# Patient Record
Sex: Male | Born: 1950 | Race: White | Hispanic: No | Marital: Married | Smoking: Former smoker
Health system: Southern US, Community
[De-identification: ages and names within clinical notes are randomized; demographics above are authoritative.]

## PROBLEM LIST (undated history)

## (undated) DIAGNOSIS — E785 Hyperlipidemia, unspecified: Secondary | ICD-10-CM

## (undated) DIAGNOSIS — K449 Diaphragmatic hernia without obstruction or gangrene: Secondary | ICD-10-CM

## (undated) DIAGNOSIS — B019 Varicella without complication: Secondary | ICD-10-CM

## (undated) DIAGNOSIS — I10 Essential (primary) hypertension: Secondary | ICD-10-CM

## (undated) DIAGNOSIS — I4891 Unspecified atrial fibrillation: Secondary | ICD-10-CM

## (undated) DIAGNOSIS — F329 Major depressive disorder, single episode, unspecified: Secondary | ICD-10-CM

## (undated) DIAGNOSIS — I00 Rheumatic fever without heart involvement: Secondary | ICD-10-CM

## (undated) DIAGNOSIS — E538 Deficiency of other specified B group vitamins: Secondary | ICD-10-CM

## (undated) DIAGNOSIS — G249 Dystonia, unspecified: Secondary | ICD-10-CM

## (undated) DIAGNOSIS — I7300 Raynaud's syndrome without gangrene: Secondary | ICD-10-CM

## (undated) DIAGNOSIS — I700 Atherosclerosis of aorta: Secondary | ICD-10-CM

## (undated) DIAGNOSIS — I639 Cerebral infarction, unspecified: Secondary | ICD-10-CM

## (undated) DIAGNOSIS — J42 Unspecified chronic bronchitis: Secondary | ICD-10-CM

## (undated) DIAGNOSIS — F32A Depression, unspecified: Secondary | ICD-10-CM

## (undated) DIAGNOSIS — M1990 Unspecified osteoarthritis, unspecified site: Secondary | ICD-10-CM

## (undated) DIAGNOSIS — F319 Bipolar disorder, unspecified: Secondary | ICD-10-CM

## (undated) DIAGNOSIS — D649 Anemia, unspecified: Secondary | ICD-10-CM

## (undated) HISTORY — DX: Unspecified osteoarthritis, unspecified site: M19.90

## (undated) HISTORY — DX: Raynaud's syndrome without gangrene: I73.00

## (undated) HISTORY — DX: Deficiency of other specified B group vitamins: E53.8

## (undated) HISTORY — DX: Hyperlipidemia, unspecified: E78.5

## (undated) HISTORY — DX: Diaphragmatic hernia without obstruction or gangrene: K44.9

## (undated) HISTORY — PX: OTHER SURGICAL HISTORY: SHX169

## (undated) HISTORY — DX: Essential (primary) hypertension: I10

## (undated) HISTORY — DX: Rheumatic fever without heart involvement: I00

## (undated) HISTORY — DX: Unspecified atrial fibrillation: I48.91

## (undated) HISTORY — DX: Major depressive disorder, single episode, unspecified: F32.9

## (undated) HISTORY — DX: Atherosclerosis of aorta: I70.0

## (undated) HISTORY — DX: Depression, unspecified: F32.A

## (undated) HISTORY — DX: Unspecified chronic bronchitis: J42

## (undated) HISTORY — DX: Varicella without complication: B01.9

## (undated) HISTORY — DX: Bipolar disorder, unspecified: F31.9

## (undated) HISTORY — PX: CLAVICLE SURGERY: SHX598

## (undated) HISTORY — PX: WISDOM TOOTH EXTRACTION: SHX21

## (undated) HISTORY — DX: Dystonia, unspecified: G24.9

## (undated) HISTORY — DX: Anemia, unspecified: D64.9

---

## 1999-01-26 ENCOUNTER — Emergency Department (HOSPITAL_COMMUNITY): Admission: EM | Admit: 1999-01-26 | Discharge: 1999-01-27 | Payer: Self-pay | Admitting: Emergency Medicine

## 1999-01-26 ENCOUNTER — Encounter: Payer: Self-pay | Admitting: Emergency Medicine

## 1999-10-27 ENCOUNTER — Encounter: Payer: Self-pay | Admitting: Emergency Medicine

## 1999-10-27 ENCOUNTER — Emergency Department (HOSPITAL_COMMUNITY): Admission: EM | Admit: 1999-10-27 | Discharge: 1999-10-27 | Payer: Self-pay | Admitting: Emergency Medicine

## 2000-12-10 ENCOUNTER — Inpatient Hospital Stay (HOSPITAL_COMMUNITY): Admission: EM | Admit: 2000-12-10 | Discharge: 2000-12-12 | Payer: Self-pay | Admitting: *Deleted

## 2002-04-26 ENCOUNTER — Inpatient Hospital Stay (HOSPITAL_COMMUNITY): Admission: EM | Admit: 2002-04-26 | Discharge: 2002-04-28 | Payer: Self-pay | Admitting: Psychiatry

## 2014-07-22 ENCOUNTER — Encounter: Payer: Self-pay | Admitting: Physician Assistant

## 2014-07-22 ENCOUNTER — Ambulatory Visit (INDEPENDENT_AMBULATORY_CARE_PROVIDER_SITE_OTHER): Payer: Medicare Other | Admitting: Physician Assistant

## 2014-07-22 VITALS — BP 128/86 | HR 58 | Temp 98.6°F | Resp 16 | Ht 68.0 in | Wt 163.8 lb

## 2014-07-22 DIAGNOSIS — J209 Acute bronchitis, unspecified: Secondary | ICD-10-CM | POA: Insufficient documentation

## 2014-07-22 MED ORDER — AZITHROMYCIN 250 MG PO TABS: ORAL_TABLET | ORAL | Status: DC

## 2014-07-22 MED ORDER — HYDROCOD POLST-CHLORPHEN POLST 10-8 MG/5ML PO LQCR: 5.0000 mL | Freq: Two times a day (BID) | ORAL | Status: DC | PRN

## 2014-07-22 NOTE — Assessment & Plan Note (Signed)
STOP amoxicillin.  Rx Azithromycin. Increase fluids.  Rest.  Rx Tussionex.  Humidifier in bedroom.  Probiotic.  Return preutions discussed with patient.

## 2014-07-22 NOTE — Patient Instructions (Signed)
Take antibiotic as directed. Increase fluid intake.  Use Tussionex as directed for cough.  Rest.  Place a humidifier in the bedroom.  Call or return to clinic if symptoms are not improving.  Please stop by the front desk to schedule a formal appointment to establish care so we can review your full history and health maintenance. We can do your full physical at that visit.

## 2014-07-22 NOTE — Progress Notes (Signed)
Patient presents to clinic today c/o productive cough of green/brown sputum, worse at night.  Also associated with chest congestion and fatigue.  Denies SOB or pleuritic chest pain.  Recently seen by previous PCP at [facility] last week and was given Rx for Tessalon Perles and Amoxicillin without improvement.    Past Medical History  Diagnosis Date  . Anemia   . Arthritis   . Chicken pox   . Depression   . Chronic bronchitis   . Hypertension   . Hyperlipidemia   . Rheumatic fever   . Vitamin B12 deficiency     Injections  . Bipolar 1 disorder    No current outpatient prescriptions on file prior to visit.   No current facility-administered medications on file prior to visit.    No Known Allergies  Family History  Problem Relation Age of Onset  . Stroke Father 76    Deceased  . Heart disease Maternal Grandfather 78    Deceased  . Hypertension Father   . Alcoholism Father   . Alzheimer's disease Mother 33    Deceased in early 72s  . Heart disease Paternal Grandfather 29    Deceased  . Alcoholism Brother   . Hypertension Son     History   Social History  . Marital Status: Married    Spouse Name: N/A    Number of Children: N/A  . Years of Education: N/A   Social History Main Topics  . Smoking status: Former Games developer  . Smokeless tobacco: None     Comment: Quit>40 years  . Alcohol Use: None  . Drug Use: None  . Sexual Activity: None   Other Topics Concern  . None   Social History Narrative  . None   Review of Systems - See HPI.  All other ROS are negative.  BP 128/86  Pulse 58  Temp(Src) 98.6 F (37 C) (Oral)  Resp 16  Ht 5\' 8"  (1.727 m)  Wt 163 lb 12 oz (74.277 kg)  BMI 24.90 kg/m2  SpO2 98%  Physical Exam  Vitals reviewed. Constitutional: He is oriented to person, place, and time and well-developed, well-nourished, and in no distress.  HENT:  Head: Normocephalic and atraumatic.  Right Ear: External ear normal.  Left Ear: External  ear normal.  Nose: Nose normal.  Mouth/Throat: Oropharynx is clear and moist. No oropharyngeal exudate.  TM within normal limits bilaterally.  Eyes: Conjunctivae are normal.  Neck: Neck supple.  Cardiovascular: Normal rate, regular rhythm, normal heart sounds and intact distal pulses.   Pulmonary/Chest: Effort normal and breath sounds normal. No respiratory distress. He has no wheezes. He has no rales. He exhibits no tenderness.  Neurological: He is alert and oriented to person, place, and time.  Skin: Skin is warm and dry. No rash noted.  Psychiatric: Affect normal.   Assessment/Plan: Acute bronchitis STOP amoxicillin.  Rx Azithromycin. Increase fluids.  Rest.  Rx Tussionex.  Humidifier in bedroom.  Probiotic.  Return preutions discussed with patient.

## 2014-07-22 NOTE — Progress Notes (Signed)
Pre visit review using our clinic review tool, if applicable. No additional management support is needed unless otherwise documented below in the visit note/SLS  

## 2014-07-28 ENCOUNTER — Telehealth: Payer: Self-pay | Admitting: *Deleted

## 2014-07-28 MED ORDER — AMLODIPINE BESYLATE 5 MG PO TABS: 5.0000 mg | ORAL_TABLET | Freq: Every day | ORAL | Status: DC

## 2014-07-28 NOTE — Telephone Encounter (Signed)
Requested Prescriptions   Signed Prescriptions Disp Refills  . amLODipine (NORVASC) 5 MG tablet 30 tablet 0    Sig: Take 1 tablet (5 mg total) by mouth daily.    Authorizing Provider: Waldon Merl    Ordering User: Burnard Leigh L   Pt walked into office with refill request, pt walked into office for Acute visit prior, does not officially Establish until 09.08.15; 30-day supply only until appointment per provider/SLS

## 2014-08-16 ENCOUNTER — Ambulatory Visit (INDEPENDENT_AMBULATORY_CARE_PROVIDER_SITE_OTHER): Payer: Medicare Other | Admitting: Physician Assistant

## 2014-08-16 ENCOUNTER — Encounter: Payer: Self-pay | Admitting: Physician Assistant

## 2014-08-16 VITALS — BP 150/80 | HR 59 | Temp 98.0°F | Ht 67.8 in | Wt 159.0 lb

## 2014-08-16 DIAGNOSIS — R5381 Other malaise: Secondary | ICD-10-CM

## 2014-08-16 DIAGNOSIS — R0989 Other specified symptoms and signs involving the circulatory and respiratory systems: Secondary | ICD-10-CM

## 2014-08-16 DIAGNOSIS — D649 Anemia, unspecified: Secondary | ICD-10-CM

## 2014-08-16 DIAGNOSIS — E782 Mixed hyperlipidemia: Secondary | ICD-10-CM

## 2014-08-16 DIAGNOSIS — F3162 Bipolar disorder, current episode mixed, moderate: Secondary | ICD-10-CM

## 2014-08-16 DIAGNOSIS — K219 Gastro-esophageal reflux disease without esophagitis: Secondary | ICD-10-CM | POA: Insufficient documentation

## 2014-08-16 DIAGNOSIS — D518 Other vitamin B12 deficiency anemias: Secondary | ICD-10-CM

## 2014-08-16 DIAGNOSIS — E785 Hyperlipidemia, unspecified: Secondary | ICD-10-CM

## 2014-08-16 DIAGNOSIS — I10 Essential (primary) hypertension: Secondary | ICD-10-CM

## 2014-08-16 DIAGNOSIS — R5383 Other fatigue: Secondary | ICD-10-CM

## 2014-08-16 DIAGNOSIS — D519 Vitamin B12 deficiency anemia, unspecified: Secondary | ICD-10-CM | POA: Insufficient documentation

## 2014-08-16 DIAGNOSIS — F319 Bipolar disorder, unspecified: Secondary | ICD-10-CM | POA: Insufficient documentation

## 2014-08-16 DIAGNOSIS — R252 Cramp and spasm: Secondary | ICD-10-CM

## 2014-08-16 DIAGNOSIS — F3181 Bipolar II disorder: Secondary | ICD-10-CM

## 2014-08-16 HISTORY — DX: Bipolar II disorder: F31.81

## 2014-08-16 HISTORY — DX: Mixed hyperlipidemia: E78.2

## 2014-08-16 HISTORY — DX: Gastro-esophageal reflux disease without esophagitis: K21.9

## 2014-08-16 HISTORY — DX: Bipolar disorder, unspecified: F31.9

## 2014-08-16 HISTORY — DX: Bipolar disorder, current episode mixed, moderate: F31.62

## 2014-08-16 HISTORY — DX: Other specified symptoms and signs involving the circulatory and respiratory systems: R09.89

## 2014-08-16 HISTORY — DX: Anemia, unspecified: D64.9

## 2014-08-16 LAB — BASIC METABOLIC PANEL
BUN: 18 mg/dL (ref 6–23)
CHLORIDE: 105 meq/L (ref 96–112)
CO2: 28 mEq/L (ref 19–32)
Calcium: 9.2 mg/dL (ref 8.4–10.5)
Creatinine, Ser: 1.1 mg/dL (ref 0.4–1.5)
GFR: 72.68 mL/min (ref >60.00)
Glucose, Bld: 94 mg/dL (ref 70–99)
Potassium: 4.2 mEq/L (ref 3.5–5.1)
Sodium: 140 mEq/L (ref 135–145)

## 2014-08-16 LAB — CBC
HCT: 38.8 % — ABNORMAL LOW (ref 39.0–52.0)
Hemoglobin: 13.1 g/dL (ref 13.0–17.0)
MCHC: 33.7 g/dL (ref 30.0–36.0)
MCV: 92.6 fl (ref 78.0–100.0)
PLATELETS: 225 10*3/uL (ref 150.0–400.0)
RBC: 4.18 Mil/uL — ABNORMAL LOW (ref 4.22–5.81)
RDW: 12.8 % (ref 11.5–15.5)
WBC: 4.4 10*3/uL (ref 4.0–10.5)

## 2014-08-16 LAB — VITAMIN B12: VITAMIN B 12: 316 pg/mL (ref 211–911)

## 2014-08-16 LAB — LITHIUM LEVEL: LITHIUM LVL: 0.7 meq/L — AB (ref 0.80–1.40)

## 2014-08-16 MED ORDER — AMLODIPINE BESYLATE 5 MG PO TABS: 5.0000 mg | ORAL_TABLET | Freq: Every day | ORAL | Status: DC

## 2014-08-16 MED ORDER — LOVASTATIN 40 MG PO TABS: 40.0000 mg | ORAL_TABLET | Freq: Every day | ORAL | Status: DC

## 2014-08-16 MED ORDER — OMEPRAZOLE 40 MG PO CPDR: 40.0000 mg | DELAYED_RELEASE_CAPSULE | Freq: Every morning | ORAL | Status: DC

## 2014-08-16 NOTE — Assessment & Plan Note (Signed)
Well controlled on current regimen. Patient to followup with specialist as directed. Will obtain lithium level. Will fax results to his psychiatrist.

## 2014-08-16 NOTE — Assessment & Plan Note (Signed)
We'll recheck CBC and B12 level. We'll also check an intrinsic factor

## 2014-08-16 NOTE — Progress Notes (Signed)
Pre visit review using our clinic review tool, if applicable. No additional management support is needed unless otherwise documented below in the visit note. 

## 2014-08-16 NOTE — Progress Notes (Signed)
Patient presents to clinic today to formally establish care.  Patient was seen previously for an acute concern.  Chronic Issues: Bipolar Disorder -- followed by psychiatry. Patient currently on combination of lithium and Tegretol. States bipolar disorder is mixed. Denies rapid cycling. Has been well controlled on this medication regimen for several years. Patient needing a lithium level checked in 4 to to his psychiatrist for review.  B12 Deficiency Anemia -- patient endorses history of B12 deficiency requiring injections. Would like a repeat B12 level checked.  Hyperlipidemia -- patient currently on Mevacor 40 mg daily. Denies myalgias. Endorses having a recent annual examination with his previous PCP. States cholesterol looked good at that time.  Hypertension -- patient currently on amlodipine daily. BP slightly elevated in clinic today. Patient denies taking his medication this morning. Patient denies chest pain, palpitations, lightheadedness, dizziness, frequent headaches or vision changes.  GERD -- well controlled with Prilosec 40 mg daily. Endorses history of hiatal hernia.  Past Medical History  Diagnosis Date  . Anemia   . Arthritis   . Chicken pox   . Depression   . Chronic bronchitis   . Hypertension   . Hyperlipidemia   . Rheumatic fever   . Vitamin B12 deficiency     Injections  . Bipolar 1 disorder     Past Surgical History  Procedure Laterality Date  . Clavicle surgery      Right, Hardware placed  . Wisdom tooth extraction      Current Outpatient Prescriptions on File Prior to Visit  Medication Sig Dispense Refill  . carbamazepine (TEGRETOL) 200 MG tablet Take 200 mg by mouth 3 (three) times daily.      . clonazePAM (KLONOPIN) 1 MG tablet Take 1 mg by mouth as directed. Take [1] Tablet at Bedtime & 1/2 Tablet Twice Daily for Anxiety      . lithium carbonate 300 MG capsule Take 300 mg by mouth 3 (three) times daily with meals.       No current  facility-administered medications on file prior to visit.    No Known Allergies  Family History  Problem Relation Age of Onset  . Stroke Father 72    Deceased  . Heart disease Maternal Grandfather 63    Deceased  . Hypertension Father   . Alcoholism Father   . Alzheimer's disease Mother 51    Deceased in early 83s  . Heart disease Paternal Grandfather 42    Deceased  . Alcoholism Brother   . Hypertension Son     History   Social History  . Marital Status: Married    Spouse Name: N/A    Number of Children: N/A  . Years of Education: N/A   Occupational History  . Not on file.   Social History Main Topics  . Smoking status: Former Games developer  . Smokeless tobacco: Not on file     Comment: Quit>40 years  . Alcohol Use: Not on file  . Drug Use: Not on file  . Sexual Activity: Not on file   Other Topics Concern  . Not on file   Social History Narrative  . No narrative on file   ROS See history of present illness. All other review of systems are negative.  BP 150/80  Pulse 59  Temp(Src) 98 F (36.7 C)  Ht 5' 7.8" (1.722 m)  Wt 159 lb (72.122 kg)  BMI 24.32 kg/m2  SpO2 99%  Physical Exam  Vitals reviewed. Constitutional: He is oriented to person, place, and  time and well-developed, well-nourished, and in no distress.  HENT:  Head: Normocephalic and atraumatic.  Right Ear: External ear normal.  Left Ear: External ear normal.  Nose: Nose normal.  Mouth/Throat: Oropharynx is clear and moist. No oropharyngeal exudate.  Tympanic membranes within normal limits bilaterally.  Eyes: Conjunctivae are normal. Pupils are equal, round, and reactive to light.  Neck: Neck supple. No thyromegaly present.  Cardiovascular: Normal rate, regular rhythm, normal heart sounds and intact distal pulses.   Pulmonary/Chest: Effort normal and breath sounds normal. No respiratory distress. He has no wheezes. He has no rales. He exhibits no tenderness.  Lymphadenopathy:    He has no  cervical adenopathy.  Neurological: He is alert and oriented to person, place, and time.  Skin: Skin is warm and dry. No rash noted.  Psychiatric: Affect normal.   Assessment/Plan: Essential hypertension, benign Continue current regimen. DASH diet encouraged. Patient instructed to please take all medications before coming to appointments so that we can accurately assess his blood pressure.  Gastroesophageal reflux disease without esophagitis Well controlled. Continue current medications.  B12 deficiency anemia We'll recheck CBC and B12 level. We'll also check an intrinsic factor  Bipolar 1 disorder, mixed, moderate Well controlled on current regimen. Patient to followup with specialist as directed. Will obtain lithium level. Will fax results to his psychiatrist.  Hyperlipidemia Patient endorses well controlled. Continue current regimen. Will obtain records from previous PCP.

## 2014-08-16 NOTE — Assessment & Plan Note (Signed)
Continue current regimen. DASH diet encouraged. Patient instructed to please take all medications before coming to appointments so that we can accurately assess his blood pressure.

## 2014-08-16 NOTE — Patient Instructions (Signed)
Please continue medications as directed.  Please go the lab.  I will call you with your results.  We will restart Vitamin B12 supplementation if indicated by labs.  Please follow-up with your psychiatrist concerning medications changes to help with tremor.  I will make sure that there is not an electrolyte imbalance contributing to this.  Follow-up will be based on your results.

## 2014-08-16 NOTE — Assessment & Plan Note (Signed)
Patient endorses well controlled. Continue current regimen. Will obtain records from previous PCP.

## 2014-08-16 NOTE — Assessment & Plan Note (Signed)
Well controlled. Continue current medications  

## 2014-08-18 LAB — INTRINSIC FACTOR ANTIBODIES: INTRINSIC FACTOR: NEGATIVE

## 2014-08-21 LAB — VITAMIN D 1,25 DIHYDROXY
VITAMIN D2 1, 25 (OH): 35 pg/mL
Vitamin D 1, 25 (OH)2 Total: 66 pg/mL (ref 18–72)
Vitamin D3 1, 25 (OH)2: 31 pg/mL

## 2014-08-22 ENCOUNTER — Telehealth: Payer: Self-pay | Admitting: Physician Assistant

## 2014-08-22 ENCOUNTER — Ambulatory Visit (INDEPENDENT_AMBULATORY_CARE_PROVIDER_SITE_OTHER): Payer: Medicare Other

## 2014-08-22 DIAGNOSIS — H9190 Unspecified hearing loss, unspecified ear: Secondary | ICD-10-CM

## 2014-08-22 DIAGNOSIS — Z23 Encounter for immunization: Secondary | ICD-10-CM

## 2014-08-22 NOTE — Telephone Encounter (Signed)
Caller name:Noah Grant Relation to ZJ:IRCV Call back number:[phone] Pharmacy:  Reason for call: pt needs referral to [facility] audiological, pt has appt on 08/29/14, pt has [employer] complete, fax to [phone].

## 2014-08-22 NOTE — Telephone Encounter (Signed)
Referral placed.

## 2014-08-22 NOTE — Telephone Encounter (Signed)
Please Advise

## 2014-11-15 ENCOUNTER — Encounter: Payer: Self-pay | Admitting: Physician Assistant

## 2014-11-15 ENCOUNTER — Ambulatory Visit (INDEPENDENT_AMBULATORY_CARE_PROVIDER_SITE_OTHER): Payer: Medicare Other | Admitting: Physician Assistant

## 2014-11-15 VITALS — BP 169/78 | HR 66 | Temp 98.4°F | Wt 168.0 lb

## 2014-11-15 DIAGNOSIS — J209 Acute bronchitis, unspecified: Secondary | ICD-10-CM | POA: Insufficient documentation

## 2014-11-15 MED ORDER — AZITHROMYCIN 250 MG PO TABS: ORAL_TABLET | ORAL | Status: DC

## 2014-11-15 MED ORDER — ALBUTEROL SULFATE HFA 108 (90 BASE) MCG/ACT IN AERS: 2.0000 | INHALATION_SPRAY | Freq: Four times a day (QID) | RESPIRATORY_TRACT | Status: DC | PRN

## 2014-11-15 NOTE — Patient Instructions (Signed)
Please take antibiotic as directed.  Increase your fluid intake.  Use plain Mucinex for congestion.  Get plenty of rest.  Delsym for nighttime cough. Continue other medications as directed.  Call or return to clinic if symptoms are not improving.  I have sent in an albuterol inhaler for you to pick up and use as directed if you notice any recurrence of chest tightness.  Metered Dose Inhaler (No Spacer Used) Inhaled medicines are the basis of treatment for asthma and other breathing problems. Inhaled medicine can only be effective if used properly. Good technique assures that the medicine reaches the lungs. Metered dose inhalers (MDIs) are used to deliver a variety of inhaled medicines. These include quick relief or rescue medicines (such as bronchodilators) and controller medicines (such as corticosteroids). The medicine is delivered by pushing down on a metal canister to release a set amount of spray. If you are using different kinds of inhalers, use your quick relief medicine to open the airways 10-15 minutes before using a steroid, if instructed to do so by your health care provider. If you are unsure which inhalers to use and the order of using them, ask your health care provider, nurse, or respiratory therapist. HOW TO USE THE INHALER 1. Remove the cap from the inhaler. 2. If you are using the inhaler for the first time, you will need to prime it. Shake the inhaler for 5 seconds and release four puffs into the air, away from your face. Ask your health care provider or pharmacist if you have questions about priming your inhaler. 3. Shake the inhaler for 5 seconds before each breath in (inhalation). 4. Position the inhaler so that the top of the canister faces up. 5. Put your index finger on the top of the medicine canister. Your thumb supports the bottom of the inhaler. 6. Open your mouth. 7. Either place the inhaler between your teeth and place your lips tightly around the mouthpiece, or hold the  inhaler 1-2 inches away from your open mouth. If you are unsure of which technique to use, ask your health care provider. 8. Breathe out (exhale) normally and as completely as possible. 9. Press the canister down with the index finger to release the medicine. 10. At the same time as the canister is pressed, inhale deeply and slowly until your lungs are completely filled. This should take 4-6 seconds. Keep your tongue down. 11. Hold the medicine in your lungs for 5-10 seconds (10 seconds is best). This helps the medicine get into the small airways of your lungs. 12. Breathe out slowly, through pursed lips. Whistling is an example of pursed lips. 13. Wait at least 1 minute between puffs. Continue with the above steps until you have taken the number of puffs your health care provider has ordered. Do not use the inhaler more than your health care provider directs you to. 14. Replace the cap on the inhaler. 15. Follow the directions from your health care provider or the inhaler insert for cleaning the inhaler. If you are using a steroid inhaler, after your last puff, rinse your mouth with water, gargle, and spit out the water. Do not swallow the water. AVOID:  Inhaling before or after starting the spray of medicine. It takes practice to coordinate your breathing with triggering the spray.  Inhaling through the nose (rather than the mouth) when triggering the spray. HOW TO DETERMINE IF YOUR INHALER IS FULL OR NEARLY EMPTY You cannot know when an inhaler is empty by shaking  it. Some inhalers are now being made with dose counters. Ask your health care provider for a prescription that has a dose counter if you feel you need that extra help. If your inhaler does not have a counter, ask your health care provider to help you determine the date you need to refill your inhaler. Write the refill date on a calendar or your inhaler canister. Refill your inhaler 7-10 days before it runs out. Be sure to keep an  adequate supply of medicine. This includes making sure it has not expired, and making sure you have a spare inhaler. SEEK MEDICAL CARE IF:  Symptoms are only partially relieved with your inhaler.  You are having trouble using your inhaler.  You experience an increase in phlegm. SEEK IMMEDIATE MEDICAL CARE IF:  You feel little or no relief with your inhalers. You are still wheezing and feeling shortness of breath, tightness in your chest, or both.  You have dizziness, headaches, or a fast heart rate.  You have chills, fever, or night sweats.  There is a noticeable increase in phlegm production, or there is blood in the phlegm. MAKE SURE YOU:  Understand these instructions.  Will watch your condition.  Will get help right away if you are not doing well or get worse. Document Released: 09/22/2007 Document Revised: 04/11/2014 Document Reviewed: 05/13/2013 [facility] Patient Information 2015 [place], [place]. This information is not intended to replace advice given to you by your health care provider. Make sure you discuss any questions you have with your health care provider.

## 2014-11-15 NOTE — Progress Notes (Signed)
Pre visit review using our clinic review tool, if applicable. No additional management support is needed unless otherwise documented below in the visit note. 

## 2014-11-15 NOTE — Assessment & Plan Note (Signed)
Rx Azithromycin.  Albuterol inhaler for chest tightness.  Dosing instructions given. Increase fluids.  Rest. Plain Mucinex. Humidifier in bedroom.  Continue chronic medications as directed.  Return precautions discussed with patient.

## 2014-11-15 NOTE — Progress Notes (Signed)
Patient presents to clinic today c/o chest congestion, productive cough with thick yellow sputum, chest tightness x 2 weeks.  Has felt feverish but denies checking temperature.  Denies pleuritic chest pain or SOB.  Denies recent travel or sick contact.  Past Medical History  Diagnosis Date  . Anemia   . Arthritis   . Chicken pox   . Depression   . Chronic bronchitis   . Hypertension   . Hyperlipidemia   . Rheumatic fever   . Vitamin B12 deficiency     Injections  . Bipolar 1 disorder     Current Outpatient Prescriptions on File Prior to Visit  Medication Sig Dispense Refill  . amLODipine (NORVASC) 5 MG tablet Take 1 tablet (5 mg total) by mouth daily. 90 tablet 1  . carbamazepine (TEGRETOL) 200 MG tablet Take 200 mg by mouth 3 (three) times daily.    . clonazePAM (KLONOPIN) 1 MG tablet Take 1 mg by mouth as directed. Take [1] Tablet at Bedtime & 1/2 Tablet Twice Daily for Anxiety    . lithium carbonate 300 MG capsule Take 300 mg by mouth 3 (three) times daily with meals.    . lovastatin (MEVACOR) 40 MG tablet Take 1 tablet (40 mg total) by mouth at bedtime. 90 tablet 1  . omeprazole (PRILOSEC) 40 MG capsule Take 1 capsule (40 mg total) by mouth every morning. 90 capsule 1   No current facility-administered medications on file prior to visit.    No Known Allergies  Family History  Problem Relation Age of Onset  . Stroke Father 110    Deceased  . Heart disease Maternal Grandfather 41    Deceased  . Hypertension Father   . Alcoholism Father   . Alzheimer's disease Mother 42    Deceased in early 56s  . Heart disease Paternal Grandfather 59    Deceased  . Alcoholism Brother   . Hypertension Son     History   Social History  . Marital Status: Married    Spouse Name: N/A    Number of Children: N/A  . Years of Education: N/A   Social History Main Topics  . Smoking status: Former Games developer  . Smokeless tobacco: None     Comment: Quit>40 years  . Alcohol Use: None  .  Drug Use: None  . Sexual Activity: None   Other Topics Concern  . None   Social History Narrative   Review of Systems - See HPI.  All other ROS are negative.  BP 169/78 mmHg  Pulse 66  Temp(Src) 98.4 F (36.9 C)  Wt 168 lb (76.204 kg)  SpO2 98%  Physical Exam  Constitutional: He is oriented to person, place, and time and well-developed, well-nourished, and in no distress.  HENT:  Head: Normocephalic and atraumatic.  Right Ear: External ear normal.  Left Ear: External ear normal.  Nose: Nose normal.  Mouth/Throat: Oropharynx is clear and moist. No oropharyngeal exudate.  TM within normal limits bilaterally.  Eyes: Conjunctivae are normal.  Neck: Neck supple.  Cardiovascular: Normal rate, regular rhythm, normal heart sounds and intact distal pulses.   Pulmonary/Chest: Effort normal and breath sounds normal. No respiratory distress. He has no wheezes. He has no rales. He exhibits no tenderness.  Lymphadenopathy:    He has no cervical adenopathy.  Neurological: He is alert and oriented to person, place, and time.  Skin: Skin is warm and dry. No rash noted.  Psychiatric: Affect normal.  Vitals reviewed.  Assessment/Plan: Acute bronchitis  Rx Azithromycin.  Albuterol inhaler for chest tightness.  Dosing instructions given. Increase fluids.  Rest. Plain Mucinex. Humidifier in bedroom.  Continue chronic medications as directed.  Return precautions discussed with patient.

## 2014-12-14 DIAGNOSIS — R079 Chest pain, unspecified: Secondary | ICD-10-CM | POA: Diagnosis not present

## 2014-12-14 DIAGNOSIS — F319 Bipolar disorder, unspecified: Secondary | ICD-10-CM | POA: Diagnosis not present

## 2014-12-14 DIAGNOSIS — E785 Hyperlipidemia, unspecified: Secondary | ICD-10-CM | POA: Diagnosis not present

## 2014-12-14 DIAGNOSIS — I10 Essential (primary) hypertension: Secondary | ICD-10-CM | POA: Diagnosis not present

## 2014-12-22 DIAGNOSIS — R011 Cardiac murmur, unspecified: Secondary | ICD-10-CM | POA: Diagnosis not present

## 2014-12-22 DIAGNOSIS — R079 Chest pain, unspecified: Secondary | ICD-10-CM | POA: Diagnosis not present

## 2015-01-09 DIAGNOSIS — F3132 Bipolar disorder, current episode depressed, moderate: Secondary | ICD-10-CM | POA: Diagnosis not present

## 2015-02-10 ENCOUNTER — Encounter: Payer: Self-pay | Admitting: Physician Assistant

## 2015-02-10 ENCOUNTER — Ambulatory Visit (INDEPENDENT_AMBULATORY_CARE_PROVIDER_SITE_OTHER): Payer: Medicare Other | Admitting: Physician Assistant

## 2015-02-10 ENCOUNTER — Ambulatory Visit (HOSPITAL_BASED_OUTPATIENT_CLINIC_OR_DEPARTMENT_OTHER)
Admission: RE | Admit: 2015-02-10 | Discharge: 2015-02-10 | Disposition: A | Discharge status: Home / Self Care | Payer: Medicare Other | Source: Ambulatory Visit | Attending: Physician Assistant | Admitting: Physician Assistant

## 2015-02-10 VITALS — BP 150/80 | HR 59 | Temp 97.9°F | Resp 16 | Ht 68.0 in | Wt 172.0 lb

## 2015-02-10 DIAGNOSIS — M25641 Stiffness of right hand, not elsewhere classified: Secondary | ICD-10-CM | POA: Diagnosis not present

## 2015-02-10 DIAGNOSIS — K219 Gastro-esophageal reflux disease without esophagitis: Secondary | ICD-10-CM | POA: Diagnosis not present

## 2015-02-10 DIAGNOSIS — M7989 Other specified soft tissue disorders: Secondary | ICD-10-CM | POA: Insufficient documentation

## 2015-02-10 DIAGNOSIS — M19042 Primary osteoarthritis, left hand: Secondary | ICD-10-CM | POA: Diagnosis not present

## 2015-02-10 DIAGNOSIS — E785 Hyperlipidemia, unspecified: Secondary | ICD-10-CM | POA: Diagnosis not present

## 2015-02-10 DIAGNOSIS — M47814 Spondylosis without myelopathy or radiculopathy, thoracic region: Secondary | ICD-10-CM | POA: Diagnosis not present

## 2015-02-10 DIAGNOSIS — M2560 Stiffness of unspecified joint, not elsewhere classified: Secondary | ICD-10-CM | POA: Diagnosis not present

## 2015-02-10 DIAGNOSIS — M19041 Primary osteoarthritis, right hand: Secondary | ICD-10-CM | POA: Diagnosis not present

## 2015-02-10 DIAGNOSIS — M546 Pain in thoracic spine: Secondary | ICD-10-CM

## 2015-02-10 DIAGNOSIS — B002 Herpesviral gingivostomatitis and pharyngotonsillitis: Secondary | ICD-10-CM | POA: Insufficient documentation

## 2015-02-10 DIAGNOSIS — M25642 Stiffness of left hand, not elsewhere classified: Secondary | ICD-10-CM | POA: Insufficient documentation

## 2015-02-10 HISTORY — DX: Herpesviral gingivostomatitis and pharyngotonsillitis: B00.2

## 2015-02-10 HISTORY — DX: Pain in thoracic spine: M54.6

## 2015-02-10 LAB — RHEUMATOID FACTOR: Rhuematoid fact SerPl-aCnc: <10 IU/mL (ref <=14)

## 2015-02-10 LAB — SEDIMENTATION RATE: SED RATE: 3 mm/h (ref 0–22)

## 2015-02-10 MED ORDER — OMEPRAZOLE 40 MG PO CPDR: 40.0000 mg | DELAYED_RELEASE_CAPSULE | Freq: Every morning | ORAL | Status: DC

## 2015-02-10 MED ORDER — ACYCLOVIR 400 MG PO TABS: 400.0000 mg | ORAL_TABLET | Freq: Two times a day (BID) | ORAL | Status: DC

## 2015-02-10 MED ORDER — LOVASTATIN 40 MG PO TABS: 40.0000 mg | ORAL_TABLET | Freq: Every day | ORAL | Status: DC

## 2015-02-10 MED ORDER — CYCLOBENZAPRINE HCL 10 MG PO TABS: 10.0000 mg | ORAL_TABLET | Freq: Every day | ORAL | Status: DC

## 2015-02-10 NOTE — Assessment & Plan Note (Signed)
Midline point tenderness noted on examination. Will obtain x-ray to further assess. Supportive measures discussed.

## 2015-02-10 NOTE — Progress Notes (Signed)
Patient presents to clinic today with multiple complaints.  Patient endorses multiple painful mouth sores that have been occurring intermittently over the past several months. Denies trauma or injury to mouth. Uses a soft bristled toothbrush. Denies recent change in diet.  Denies prior history of aphthous ulcer. Thinks he has had a cold sore before.  Patient also complains of pain and prolonged stiffness of the joints in his hands bilaterally. States this is been present for several years but has now worsened. Endorses intermittent swelling. Denies unexplainable fever. Endorses positive family history of rheumatoid arthritis. Has never had imaging for this.  Patient also complains of thoracic back pain starting 2 months ago. States the pain is midline and is nonradiating states the pain is sharp and sometimes present when ambulating. Denies known trauma or injury. Denies history of vertebral fracture. Denies numbness or tingling around the area. Has not taken anything for his symptoms.  Past Medical History  Diagnosis Date  . Anemia   . Arthritis   . Chicken pox   . Depression   . Chronic bronchitis   . Hypertension   . Hyperlipidemia   . Rheumatic fever   . Vitamin B12 deficiency     Injections  . Bipolar 1 disorder     Current Outpatient Prescriptions on File Prior to Visit  Medication Sig Dispense Refill  . carbamazepine (TEGRETOL) 200 MG tablet Take 200 mg by mouth 3 (three) times daily.    [name] lithium carbonate 300 MG capsule Take 300 mg by mouth 2 (two) times daily with a meal.      No current facility-administered medications on file prior to visit.    No Known Allergies  Family History  Problem Relation Age of Onset  . Stroke Father 64    Deceased  . Heart disease Maternal Grandfather 32    Deceased  . Hypertension Father   . Alcoholism Father   . Alzheimer's disease Mother 31    Deceased in early 64s  . Heart disease Paternal Grandfather 51    Deceased  .  Alcoholism Brother   . Hypertension Son     History   Social History  . Marital Status: Married    Spouse Name: N/A  . Number of Children: N/A  . Years of Education: N/A   Social History Main Topics  . Smoking status: Former Games developer  . Smokeless tobacco: Not on file     Comment: Quit>40 years  . Alcohol Use: Not on file  . Drug Use: Not on file  . Sexual Activity: Not on file   Other Topics Concern  . None   Social History Narrative   Review of Systems - See HPI.  All other ROS are negative.  BP 150/80 mmHg  Pulse 59  Temp(Src) 97.9 F (36.6 C) (Oral)  Resp 16  Ht 5\' 8"  (1.727 m)  Wt 172 lb (78.019 kg)  BMI 26.16 kg/m2  SpO2 99%  Physical Exam  Constitutional: He is oriented to person, place, and time and well-developed, well-nourished, and in no distress.  HENT:  Head: Normocephalic and atraumatic.  Right Ear: External ear normal.  Left Ear: External ear normal.  Nose: Nose normal.  Mouth/Throat: Oropharynx is clear and moist. No oropharyngeal exudate.  Eyes: Conjunctivae are normal.  Cardiovascular: Normal rate, regular rhythm, normal heart sounds and intact distal pulses.   Pulmonary/Chest: Effort normal and breath sounds normal. No respiratory distress. He has no wheezes. He has no rales. He exhibits no tenderness.  Musculoskeletal:       Thoracic back: He exhibits tenderness, bony tenderness, pain and spasm. He exhibits normal range of motion.  Neurological: He is alert and oriented to person, place, and time.  Skin: Skin is warm and dry. No rash noted.  Psychiatric: Affect normal.  Vitals reviewed.   Recent Results (from the past 2160 hour(s))  Sed Rate (ESR)     Status: None   Collection Time: 02/10/15  9:57 AM  Result Value Ref Range   Sed Rate 3 0 - 22 mm/hr    Assessment/Plan: Herpes gingivostomatitis Will begin Prophylactic acyclovir 400 mg twice daily.  Encouraged use of peroxide mouthwash.  follow-up in one month.   Joint stiffness OA  versus RA. Will obtain x-ray of hands bilaterally. We'll also obtain sedimentation rate and rheumatoid factor given family history. Supportive measures discussed. Encouraged daily Tylenol or ibuprofen.   Midline thoracic back pain Midline point tenderness noted on examination. Will obtain x-ray to further assess. Supportive measures discussed.

## 2015-02-10 NOTE — Progress Notes (Signed)
Pre visit review using our clinic review tool, if applicable. No additional management support is needed unless otherwise documented below in the visit note/SLS  

## 2015-02-10 NOTE — Patient Instructions (Signed)
Please take Zovirax twice daily as directed.  Increase fluids.  Continue other medications as directed. Alternate tylenol extra strength and ibuprofen as needed for pain. Use Flexeril at bedtime.  I will call you with your results.   Follow-up in 1 month.

## 2015-02-10 NOTE — Assessment & Plan Note (Signed)
Will begin Prophylactic acyclovir 400 mg twice daily.  Encouraged use of peroxide mouthwash.  follow-up in one month.

## 2015-02-10 NOTE — Assessment & Plan Note (Signed)
OA versus RA. Will obtain x-ray of hands bilaterally. We'll also obtain sedimentation rate and rheumatoid factor given family history. Supportive measures discussed. Encouraged daily Tylenol or ibuprofen.

## 2015-03-14 ENCOUNTER — Encounter: Payer: Self-pay | Admitting: Physician Assistant

## 2015-03-14 ENCOUNTER — Ambulatory Visit (INDEPENDENT_AMBULATORY_CARE_PROVIDER_SITE_OTHER): Payer: Medicare Other | Admitting: Physician Assistant

## 2015-03-14 VITALS — BP 143/68 | HR 56 | Temp 98.0°F | Resp 16 | Ht 68.0 in | Wt 170.4 lb

## 2015-03-14 DIAGNOSIS — M2560 Stiffness of unspecified joint, not elsewhere classified: Secondary | ICD-10-CM | POA: Diagnosis not present

## 2015-03-14 DIAGNOSIS — B002 Herpesviral gingivostomatitis and pharyngotonsillitis: Secondary | ICD-10-CM

## 2015-03-14 NOTE — Progress Notes (Signed)
Patient presents to clinic today for follow-up of joint stiffness and herpetic gingivostomatitis.   Workup for RA unremarkable.  X-rays revealed mild osteoarthritic changes of thoracic spine and hands bilaterally. Patient has taken Flexeril without improvement in symptoms. Has stopped medication of his own accord. Is getting some improvement with Aleve once daily.  Patient states all oral symptoms have resolved with use of the acyclovir. Denies recurrence of symptoms.  Past Medical History  Diagnosis Date  . Anemia   . Arthritis   . Chicken pox   . Depression   . Chronic bronchitis   . Hypertension   . Hyperlipidemia   . Rheumatic fever   . Vitamin B12 deficiency     Injections  . Bipolar 1 disorder     Current Outpatient Prescriptions on File Prior to Visit  Medication Sig Dispense Refill  . acyclovir (ZOVIRAX) 400 MG tablet Take 1 tablet (400 mg total) by mouth 2 (two) times daily. 60 tablet 1  . amLODipine (NORVASC) 10 MG tablet Take 10 mg by mouth daily.    . carbamazepine (TEGRETOL) 200 MG tablet Take 200 mg by mouth 3 (three) times daily.    [name] lithium carbonate 300 MG capsule Take 300 mg by mouth 2 (two) times daily with a meal.     . lovastatin (MEVACOR) 40 MG tablet Take 1 tablet (40 mg total) by mouth at bedtime. 90 tablet 1  . omeprazole (PRILOSEC) 40 MG capsule Take 1 capsule (40 mg total) by mouth every morning. 90 capsule 1  . traZODone (DESYREL) 50 MG tablet Take 100 mg by mouth at bedtime.      No current facility-administered medications on file prior to visit.    No Known Allergies  Family History  Problem Relation Age of Onset  . Stroke Father 48    Deceased  . Heart disease Maternal Grandfather 79    Deceased  . Hypertension Father   . Alcoholism Father   . Alzheimer's disease Mother 60    Deceased in early 7s  . Heart disease Paternal Grandfather 37    Deceased  . Alcoholism Brother   . Hypertension Son     History   Social History  .  Marital Status: Married    Spouse Name: N/A  . Number of Children: N/A  . Years of Education: N/A   Social History Main Topics  . Smoking status: Former Research scientist (life sciences)  . Smokeless tobacco: Not on file     Comment: Quit>40 years  . Alcohol Use: Not on file  . Drug Use: Not on file  . Sexual Activity: Not on file   Other Topics Concern  . None   Social History Narrative    Review of Systems - See HPI.  All other ROS are negative.  BP 143/68 mmHg  Pulse 56  Temp(Src) 98 F (36.7 C) (Oral)  Resp 16  Ht $R'5\' 8"'Ga$  (1.727 m)  Wt 170 lb 6 oz (77.282 kg)  BMI 25.91 kg/m2  SpO2 100%  Physical Exam  Constitutional: He is oriented to person, place, and time and well-developed, well-nourished, and in no distress.  HENT:  Head: Normocephalic and atraumatic.  Mouth/Throat: Oropharynx is clear and moist. No oropharyngeal exudate.  Cardiovascular: Normal rate, regular rhythm, normal heart sounds and intact distal pulses.   Pulmonary/Chest: Effort normal and breath sounds normal. No respiratory distress. He has no wheezes. He has no rales. He exhibits no tenderness.  Neurological: He is alert and oriented to person, place, and  time.  Skin: Skin is warm and dry. No rash noted.  Psychiatric: Affect normal.  Vitals reviewed.   Recent Results (from the past 2160 hour(s))  Sed Rate (ESR)     Status: None   Collection Time: 02/10/15  9:57 AM  Result Value Ref Range   Sed Rate 3 0 - 22 mm/hr  Rheumatoid Factor     Status: None   Collection Time: 02/10/15  9:57 AM  Result Value Ref Range   Rhuematoid fact SerPl-aCnc <10 <=14 IU/mL    Comment:                            Interpretive Table                     Low Positive: 15 - 41 IU/mL                     High Positive:  >= 42 IU/mL    In addition to the RF result, and clinical symptoms including joint  involvement, the 2010 ACR Classification Criteria for  scoring/diagnosing Rheumatoid Arthritis include the results of the  following tests:   CRP (29562), ESR (15010), and CCP (APCA) (13086).  www.rheumatology.org/practice/clinical/classification/ra/ra_2010.asp     Assessment/Plan: Herpes gingivostomatitis Resolved. Continue prophylactic measures.   Joint stiffness Osteoarthritis related. Encouraged patient to increase Aleve to twice daily. Take with food. Topical NSAID use discussed with patient. Also discussed potential benefit of physical therapy. Will follow-up in 3 months.

## 2015-03-14 NOTE — Assessment & Plan Note (Signed)
Osteoarthritis related. Encouraged patient to increase Aleve to twice daily. Take with food. Topical NSAID use discussed with patient. Also discussed potential benefit of physical therapy. Will follow-up in 3 months.

## 2015-03-14 NOTE — Assessment & Plan Note (Addendum)
Resolved. Continue prophylactic measures.

## 2015-03-14 NOTE — Progress Notes (Signed)
Pre visit review using our clinic review tool, if applicable. No additional management support is needed unless otherwise documented below in the visit note/SLS  

## 2015-03-14 NOTE — Patient Instructions (Signed)
Please keep the Acyclovir on hand for any recurrence of oral lesion. Please increase Aleve to 1 tablet twice daily with food. Apply topical Aspercreme to the area. Try to stay active but limit heavy lifting.  Call me in a few weeks to let me know how you are doing.

## 2015-04-18 ENCOUNTER — Telehealth: Payer: Self-pay | Admitting: Physician Assistant

## 2015-04-18 NOTE — Telephone Encounter (Signed)
Received call from [place] with [facility]. She was stating that pt called concerning copay of $50 for visit on 03/14/15. Adair Laundry said that the bill was submitted incorrectly and needed to know if our office was an in-network provider for pts [facility] plan. I advised her that pt came into the office this morning, we made a copy of the bill, and we have given it to our manager to review. Tried to transfer call to [place] but she was unavailable and [place] did not want to leave a voicemail. Adair Laundry states she will call back at a later time.

## 2015-04-26 NOTE — Telephone Encounter (Signed)
Received copy of patients bill and sent an email to follow up with the billing office. I will contact patient once this issue is resolved

## 2015-05-15 ENCOUNTER — Other Ambulatory Visit: Payer: Self-pay | Admitting: Physician Assistant

## 2015-07-10 ENCOUNTER — Encounter: Payer: Self-pay | Admitting: Medical

## 2015-07-10 ENCOUNTER — Ambulatory Visit (INDEPENDENT_AMBULATORY_CARE_PROVIDER_SITE_OTHER): Payer: Medicare Other | Admitting: Medical

## 2015-07-10 VITALS — BP 160/80 | HR 61 | Temp 99.1°F | Ht 68.0 in | Wt 165.4 lb

## 2015-07-10 DIAGNOSIS — J208 Acute bronchitis due to other specified organisms: Secondary | ICD-10-CM

## 2015-07-10 MED ORDER — BENZONATATE 200 MG PO CAPS: 200.0000 mg | ORAL_CAPSULE | Freq: Three times a day (TID) | ORAL | Status: DC | PRN

## 2015-07-10 MED ORDER — AZITHROMYCIN 250 MG PO TABS: ORAL_TABLET | ORAL | Status: DC

## 2015-07-10 NOTE — Patient Instructions (Signed)
You appear to have bronchitis. Rx azithromycin antibiotic and benzonatate for cough. If symptoms persist despite tx then recommend cxr.  Follow up in 7 days or as needed

## 2015-07-10 NOTE — Progress Notes (Signed)
Subjective:    Patient ID: Noah Grant, male    DOB: 09-08-1951, 64 y.o.   MRN: [id number]  HPI  Pt in stating same symptoms as in past with bronchitis. Cough, chest congestion for 3 days. Mucous is productive. Some brownish color. One night he had some chills. Some fatigued. Symptoms started on Friday. Non smoker. NO wheezing.     Review of Systems  Constitutional: Positive for chills. Negative for fever and fatigue.  HENT: Negative for congestion, ear pain, rhinorrhea and sinus pressure.   Respiratory: Positive for cough. Negative for chest tightness, shortness of breath and wheezing.        Chest congestion.  Cardiovascular: Negative for chest pain and palpitations.  Musculoskeletal: Negative for back pain.  Neurological: Negative for dizziness and headaches.  Hematological: Negative for adenopathy. Does not bruise/bleed easily.  Psychiatric/Behavioral: Negative for behavioral problems and confusion.    Past Medical History  Diagnosis Date  . Anemia   . Arthritis   . Chicken pox   . Depression   . Chronic bronchitis   . Hypertension   . Hyperlipidemia   . Rheumatic fever   . Vitamin B12 deficiency     Injections  . Bipolar 1 disorder     History   Social History  . Marital Status: Married    Spouse Name: N/A  . Number of Children: N/A  . Years of Education: N/A   Occupational History  . Not on file.   Social History Main Topics  . Smoking status: Former Games developer  . Smokeless tobacco: Not on file     Comment: Quit>40 years  . Alcohol Use: Not on file  . Drug Use: Not on file  . Sexual Activity: Not on file   Other Topics Concern  . Not on file   Social History Narrative    Past Surgical History  Procedure Laterality Date  . Clavicle surgery      Right, Hardware placed  . Wisdom tooth extraction      Family History  Problem Relation Age of Onset  . Stroke Father 1    Deceased  . Heart disease Maternal Grandfather 90    Deceased  .  Hypertension Father   . Alcoholism Father   . Alzheimer's disease Mother 22    Deceased in early 35s  . Heart disease Paternal Grandfather 38    Deceased  . Alcoholism Brother   . Hypertension Son     No Known Allergies  Current Outpatient Prescriptions on File Prior to Visit  Medication Sig Dispense Refill  . amLODipine (NORVASC) 10 MG tablet Take 10 mg by mouth daily.    . carbamazepine (TEGRETOL) 200 MG tablet Take 200 mg by mouth 3 (three) times daily.    . cyanocobalamin 1000 MCG tablet Take 100 mcg by mouth daily.    [name] lithium carbonate 300 MG capsule Take 300 mg by mouth 2 (two) times daily with a meal.     . lovastatin (MEVACOR) 40 MG tablet TAKE ONE TABLET BY MOUTH EVERY NIGHT AT BEDTIME 90 tablet 1  . omeprazole (PRILOSEC) 40 MG capsule TAKE ONE CAPSULE BY MOUTH EVERY MORNING 90 capsule 1   No current facility-administered medications on file prior to visit.    BP 160/80 mmHg  Pulse 61  Temp(Src) 99.1 F (37.3 C) (Oral)  Ht 5\' 8"  (1.727 m)  Wt 165 lb 6.4 oz (75.025 kg)  BMI 25.15 kg/m2  SpO2 100%       Objective:  Physical Exam  General  Mental Status - Alert. General Appearance - Well groomed. Not in acute distress.  Skin Rashes- No Rashes.  HEENT Head- Normal. Ear Auditory Canal - Left- Normal. Right - Normal.Tympanic Membrane- Left- Normal. Right- Normal. Eye Sclera/Conjunctiva- Left- Normal. Right- Normal. Nose & Sinuses Nasal Mucosa- Left-  Not oggy or Congested. Right-  Not  boggy or Congested. Mouth & Throat Lips: Upper Lip- Normal: no dryness, cracking, pallor, cyanosis, or vesicular eruption. Lower Lip-Normal: no dryness, cracking, pallor, cyanosis or vesicular eruption. Buccal Mucosa- Bilateral- No Aphthous ulcers. Oropharynx- No Discharge or Erythema. Tonsils: Characteristics- Bilateral- No Erythema or Congestion. Size/Enlargement- Bilateral- No enlargement. Discharge- bilateral-None.  Neck Neck- Supple. No Masses.   Chest and Lung  Exam Auscultation: Breath Sounds:- even and unlabored, but bilateral upper lobe rhonchi.  Cardiovascular Auscultation:Rythm- Regular, rate and rhythm. Murmurs & Other Heart Sounds:Ausculatation of the heart reveal- No Murmurs.  Lymphatic Head & Neck General Head & Neck Lymphatics: Bilateral: Description- No Localized lymphadenopathy.      Assessment & Plan:  You appear to have bronchitis. Rx azithromycin antibiotic and benzonatate for cough. If symptoms persist despite tx then recommend cxr.  Follow up in 7 days or as needed  Advise stop otc decongestant due to bp elevation. Pt agreed he would stop.

## 2015-07-10 NOTE — Progress Notes (Signed)
Pre visit review using our clinic review tool, if applicable. No additional management support is needed unless otherwise documented below in the visit note. 

## 2015-07-14 DIAGNOSIS — H903 Sensorineural hearing loss, bilateral: Secondary | ICD-10-CM | POA: Diagnosis not present

## 2015-07-14 DIAGNOSIS — H9193 Unspecified hearing loss, bilateral: Secondary | ICD-10-CM | POA: Diagnosis not present

## 2015-07-19 ENCOUNTER — Ambulatory Visit (INDEPENDENT_AMBULATORY_CARE_PROVIDER_SITE_OTHER): Payer: Medicare Other | Admitting: Physician Assistant

## 2015-07-19 ENCOUNTER — Encounter: Payer: Self-pay | Admitting: Physician Assistant

## 2015-07-19 VITALS — BP 138/76 | HR 67 | Temp 98.3°F | Resp 16 | Ht 68.0 in | Wt 166.2 lb

## 2015-07-19 DIAGNOSIS — J208 Acute bronchitis due to other specified organisms: Secondary | ICD-10-CM

## 2015-07-19 DIAGNOSIS — I10 Essential (primary) hypertension: Secondary | ICD-10-CM

## 2015-07-19 MED ORDER — METHYLPREDNISOLONE ACETATE 40 MG/ML IJ SUSP
40.0000 mg | Freq: Once | INTRAMUSCULAR | Status: AC
  Administered 2015-07-19: 40 mg via INTRAMUSCULAR

## 2015-07-19 MED ORDER — AMLODIPINE BESYLATE 10 MG PO TABS: 10.0000 mg | ORAL_TABLET | Freq: Every day | ORAL | Status: DC

## 2015-07-19 NOTE — Patient Instructions (Signed)
Please continue blood pressure medication as directed. I have sent in refills for you.  For the cough and congestion, I do not feel another antibiotic is warranted as symptoms have improved. The steroid given today will help further open your airways.  Stay well hydrated and get plenty of rest. Use Mucinex-DM for cough and congestion.  Call or return to clinic if symptoms are not resolving.

## 2015-07-19 NOTE — Progress Notes (Signed)
Patient presents to clinic today to discuss medication management regarding amlodipine. Was previously prescribed this medication by Cardiology but is no longer followed by provider due to insurance changes. Endorses taking daily as directed. Patient denies chest pain, palpitations, lightheadedness, dizziness, vision changes or frequent headaches.   Still endorses bronchitis symptoms including -- dry cough, chest congestion and fatigue despite treatment with azithromycin. Endorses symptoms have improved somewhat but are persistent. Denies fever, chills. Denies recent travel or sick contact. Tessalon perles with some relief in cough.  Past Medical History  Diagnosis Date  . Anemia   . Arthritis   . Chicken pox   . Depression   . Chronic bronchitis   . Hypertension   . Hyperlipidemia   . Rheumatic fever   . Vitamin B12 deficiency     Injections  . Bipolar 1 disorder     Current Outpatient Prescriptions on File Prior to Visit  Medication Sig Dispense Refill  . benzonatate (TESSALON) 200 MG capsule Take 1 capsule (200 mg total) by mouth 3 (three) times daily as needed for cough. 30 capsule 0  . carbamazepine (TEGRETOL) 200 MG tablet Take 200 mg by mouth 3 (three) times daily.    . cyanocobalamin 1000 MCG tablet Take 100 mcg by mouth daily.    [name] lithium carbonate 300 MG capsule Take 300 mg by mouth 2 (two) times daily with a meal.     . lovastatin (MEVACOR) 40 MG tablet TAKE ONE TABLET BY MOUTH EVERY NIGHT AT BEDTIME 90 tablet 1  . omeprazole (PRILOSEC) 40 MG capsule TAKE ONE CAPSULE BY MOUTH EVERY MORNING 90 capsule 1   No current facility-administered medications on file prior to visit.    No Known Allergies  Family History  Problem Relation Age of Onset  . Stroke Father 12    Deceased  . Heart disease Maternal Grandfather 41    Deceased  . Hypertension Father   . Alcoholism Father   . Alzheimer's disease Mother 22    Deceased in early 58s  . Heart disease Paternal  Grandfather 23    Deceased  . Alcoholism Brother   . Hypertension Son     Social History   Social History  . Marital Status: Married    Spouse Name: N/A  . Number of Children: N/A  . Years of Education: N/A   Social History Main Topics  . Smoking status: Former Games developer  . Smokeless tobacco: None     Comment: Quit>40 years  . Alcohol Use: None  . Drug Use: None  . Sexual Activity: Not Asked   Other Topics Concern  . None   Social History Narrative   Review of Systems - See HPI.  All other ROS are negative.  BP 148/82 mmHg  Pulse 67  Temp(Src) 98.3 F (36.8 C) (Oral)  Resp 16  Ht 5\' 8"  (1.727 m)  Wt 166 lb 3.2 oz (75.388 kg)  BMI 25.28 kg/m2  SpO2 99%  Physical Exam  Constitutional: He is oriented to person, place, and time and well-developed, well-nourished, and in no distress.  HENT:  Head: Normocephalic and atraumatic.  Right Ear: External ear normal.  Left Ear: External ear normal.  Nose: Nose normal.  Mouth/Throat: Oropharynx is clear and moist. No oropharyngeal exudate.  TM within normal limits bilaterally.  Eyes: Conjunctivae are normal. Pupils are equal, round, and reactive to light.  Neck: Neck supple.  Cardiovascular: Normal rate, regular rhythm, normal heart sounds and intact distal pulses.   Pulmonary/Chest:  Effort normal and breath sounds normal. No respiratory distress. He has no wheezes. He has no rales. He exhibits no tenderness.  Neurological: He is alert and oriented to person, place, and time.  Skin: Skin is warm and dry. No rash noted.  Psychiatric: Affect normal.  Vitals reviewed.    Assessment/Plan: Essential hypertension, benign Will take over BP medications. Repeat BP at 138/76. Continue current regimen. DASH diet discussed. Medications refilled. Follow-up 6 months.  Acute bronchitis Resolving. Im Depo Medrol given to help open airways further and speed recovery. Continue tessalon as directed. Mucinex-DM also  recommended.

## 2015-07-19 NOTE — Progress Notes (Signed)
Pre visit review using our clinic review tool, if applicable. No additional management support is needed unless otherwise documented below in the visit note. 

## 2015-07-20 ENCOUNTER — Encounter: Payer: Self-pay | Admitting: Physician Assistant

## 2015-07-20 NOTE — Assessment & Plan Note (Signed)
Resolving. Im Depo Medrol given to help open airways further and speed recovery. Continue tessalon as directed. Mucinex-DM also recommended.

## 2015-07-20 NOTE — Assessment & Plan Note (Signed)
Will take over BP medications. Repeat BP at 138/76. Continue current regimen. DASH diet discussed. Medications refilled. Follow-up 6 months.

## 2015-10-04 ENCOUNTER — Ambulatory Visit (INDEPENDENT_AMBULATORY_CARE_PROVIDER_SITE_OTHER): Payer: Medicare Other | Admitting: Behavioral Health

## 2015-10-04 DIAGNOSIS — Z23 Encounter for immunization: Secondary | ICD-10-CM

## 2015-10-04 NOTE — Progress Notes (Signed)
Pre visit review using our clinic review tool, if applicable. No additional management support is needed unless otherwise documented below in the visit note. 

## 2016-01-19 ENCOUNTER — Encounter: Payer: Self-pay | Admitting: Physician Assistant

## 2016-01-19 ENCOUNTER — Ambulatory Visit (INDEPENDENT_AMBULATORY_CARE_PROVIDER_SITE_OTHER): Payer: Medicare Other | Admitting: Physician Assistant

## 2016-01-19 ENCOUNTER — Other Ambulatory Visit: Payer: Self-pay | Admitting: Physician Assistant

## 2016-01-19 ENCOUNTER — Ambulatory Visit (HOSPITAL_BASED_OUTPATIENT_CLINIC_OR_DEPARTMENT_OTHER)
Admission: RE | Admit: 2016-01-19 | Discharge: 2016-01-19 | Disposition: A | Discharge status: Home / Self Care | Payer: Medicare Other | Source: Ambulatory Visit | Attending: Physician Assistant | Admitting: Physician Assistant

## 2016-01-19 VITALS — BP 158/80 | HR 69 | Temp 98.4°F | Ht 68.0 in | Wt 168.8 lb

## 2016-01-19 DIAGNOSIS — G8929 Other chronic pain: Secondary | ICD-10-CM | POA: Insufficient documentation

## 2016-01-19 DIAGNOSIS — I10 Essential (primary) hypertension: Secondary | ICD-10-CM

## 2016-01-19 DIAGNOSIS — M25551 Pain in right hip: Secondary | ICD-10-CM | POA: Insufficient documentation

## 2016-01-19 DIAGNOSIS — E785 Hyperlipidemia, unspecified: Secondary | ICD-10-CM

## 2016-01-19 HISTORY — DX: Pain in right hip: M25.551

## 2016-01-19 LAB — COMPREHENSIVE METABOLIC PANEL
ALBUMIN: 4.2 g/dL (ref 3.5–5.2)
ALT: 15 U/L (ref 0–53)
AST: 12 U/L (ref 0–37)
Alkaline Phosphatase: 67 U/L (ref 39–117)
BILIRUBIN TOTAL: 0.4 mg/dL (ref 0.2–1.2)
BUN: 17 mg/dL (ref 6–23)
CALCIUM: 9.2 mg/dL (ref 8.4–10.5)
CO2: 32 mEq/L (ref 19–32)
Chloride: 106 mEq/L (ref 96–112)
Creatinine, Ser: 1.15 mg/dL (ref 0.40–1.50)
GFR: 68.01 mL/min (ref >60.00)
Glucose, Bld: 99 mg/dL (ref 70–99)
Potassium: 4.5 mEq/L (ref 3.5–5.1)
SODIUM: 141 meq/L (ref 135–145)
Total Protein: 6.6 g/dL (ref 6.0–8.3)

## 2016-01-19 LAB — LIPID PANEL
Cholesterol: 121 mg/dL (ref 0–200)
HDL: 30 mg/dL — AB (ref >39.00)
LDL CALC: 57 mg/dL (ref 0–99)
NONHDL: 90.97
Total CHOL/HDL Ratio: 4
Triglycerides: 169 mg/dL — ABNORMAL HIGH (ref 0.0–149.0)
VLDL: 33.8 mg/dL (ref 0.0–40.0)

## 2016-01-19 MED ORDER — TRAMADOL HCL 50 MG PO TABS: 50.0000 mg | ORAL_TABLET | Freq: Three times a day (TID) | ORAL | Status: DC | PRN

## 2016-01-19 NOTE — Progress Notes (Signed)
Patient with history of significant OA of spine presents to clinic today c/o chronic R hip pain since teenage years. States pain is normally controlled with rare use of tylenol. Endorses over the past couple of weeks has noted significant hip stiffness, worse in the morning. Endorses pain rated a 5/10 on a pain scale. Main concern is the weakness. Denies trauma or injury.   Of note BP elevated today. Patient endorses taking medications as directed. Patient denies chest pain, palpitations, lightheadedness, dizziness, vision changes or frequent headaches.  BP Readings from Last 3 Encounters:  01/19/16 158/80  07/19/15 138/76  07/10/15 160/80    Past Medical History  Diagnosis Date  . Anemia   . Arthritis   . Chicken pox   . Depression   . Chronic bronchitis ([place])   . Hypertension   . Hyperlipidemia   . Rheumatic fever   . Vitamin B12 deficiency     Injections  . Bipolar 1 disorder [facility])     Current Outpatient Prescriptions on File Prior to Visit  Medication Sig Dispense Refill  . amLODipine (NORVASC) 10 MG tablet Take 1 tablet (10 mg total) by mouth daily. 90 tablet 1  . carbamazepine (TEGRETOL) 200 MG tablet Take 200 mg by mouth 3 (three) times daily.    . cyanocobalamin 1000 MCG tablet Take 100 mcg by mouth daily.    [name] lithium carbonate 300 MG capsule Take 300 mg by mouth 2 (two) times daily with a meal.     . lovastatin (MEVACOR) 40 MG tablet TAKE ONE TABLET BY MOUTH EVERY NIGHT AT BEDTIME 90 tablet 1  . omeprazole (PRILOSEC) 40 MG capsule TAKE ONE CAPSULE BY MOUTH EVERY MORNING 90 capsule 1   No current facility-administered medications on file prior to visit.    No Known Allergies  Family History  Problem Relation Age of Onset  . Stroke Father 93    Deceased  . Heart disease Maternal Grandfather 3    Deceased  . Hypertension Father   . Alcoholism Father   . Alzheimer's disease Mother 65    Deceased in early 39s  . Heart disease Paternal Grandfather 79   Deceased  . Alcoholism Brother   . Hypertension Son     Social History   Social History  . Marital Status: Married    Spouse Name: N/A  . Number of Children: N/A  . Years of Education: N/A   Social History Main Topics  . Smoking status: Former Research scientist (life sciences)  . Smokeless tobacco: None     Comment: Quit>40 years  . Alcohol Use: None  . Drug Use: None  . Sexual Activity: Not Asked   Other Topics Concern  . None   Social History Narrative   Review of Systems - See HPI.  All other ROS are negative.  BP 158/80 mmHg  Pulse 69  Temp(Src) 98.4 F (36.9 C) (Oral)  Ht '5\' 8"'$  (1.727 m)  Wt 168 lb 12.8 oz (76.567 kg)  BMI 25.67 kg/m2  SpO2 100%  Physical Exam  Constitutional: He is oriented to person, place, and time and well-developed, well-nourished, and in no distress.  HENT:  Head: Normocephalic and atraumatic.  Cardiovascular: Normal rate, regular rhythm, normal heart sounds and intact distal pulses.   Pulmonary/Chest: Effort normal and breath sounds normal. No respiratory distress. He has no wheezes. He has no rales. He exhibits no tenderness.  Musculoskeletal:       Right hip: He exhibits normal strength, no tenderness, no bony tenderness and  no crepitus.  Pain with abduction and rotation. ROM preserved but painful  Neurological: He is oriented to person, place, and time.  Skin: Skin is warm and dry. No rash noted.  Vitals reviewed.   Recent Results (from the past 2160 hour(s))  Comp Met (CMET)     Status: None   Collection Time: 01/19/16  9:46 AM  Result Value Ref Range   Sodium 141 135 - 145 mEq/L   Potassium 4.5 3.5 - 5.1 mEq/L   Chloride 106 96 - 112 mEq/L   CO2 32 19 - 32 mEq/L   Glucose, Bld 99 70 - 99 mg/dL   BUN 17 6 - 23 mg/dL   Creatinine, Ser 1.15 0.40 - 1.50 mg/dL   Total Bilirubin 0.4 0.2 - 1.2 mg/dL   Alkaline Phosphatase 67 39 - 117 U/L   AST 12 0 - 37 U/L   ALT 15 0 - 53 U/L   Total Protein 6.6 6.0 - 8.3 g/dL   Albumin 4.2 3.5 - 5.2 g/dL   Calcium  9.2 8.4 - 10.5 mg/dL   GFR 68.01 >60.00 mL/min  Lipid Profile     Status: Abnormal   Collection Time: 01/19/16  9:46 AM  Result Value Ref Range   Cholesterol 121 0 - 200 mg/dL    Comment: ATP III Classification       Desirable:  < 200 mg/dL               Borderline High:  200 - 239 mg/dL          High:  > = 240 mg/dL   Triglycerides 169.0 (H) 0.0 - 149.0 mg/dL    Comment: Normal:  <150 mg/dLBorderline High:  150 - 199 mg/dL   HDL 30.00 (L) >39.00 mg/dL   VLDL 33.8 0.0 - 40.0 mg/dL   LDL Cholesterol 57 0 - 99 mg/dL   Total CHOL/HDL Ratio 4     Comment:                Men          Women1/2 Average Risk     3.4          3.3Average Risk          5.0          4.42X Average Risk          9.6          7.13X Average Risk          15.0          11.0                       NonHDL 90.97     Comment: NOTE:  Non-HDL goal should be 30 mg/dL higher than patient's LDL goal (i.e. LDL goal of < 70 mg/dL, would have non-HDL goal of < 100 mg/dL)    Assessment/Plan: Essential hypertension, benign Slightly above goal today secondary to pain. Will continue current regimen. Will check CMP today.  Right hip pain Chronic with acute exacerbation. Will check x-ray hips bilaterally. Concerned for muscular component. RICE discussed. Rx Tramadol. Will consider referral to sports medicine or ortho based on imaging results.

## 2016-01-19 NOTE — Patient Instructions (Signed)
Please go downstairs for imaging. I will call with your results.  Please take the Tramadol as directed for severe pain. For mild pain, use Tylenol Arthritis instead.   We will likely need to refer you to Orthopedics but will assess x-ray first.   Please go to the lab for blood work on your way out today.

## 2016-01-19 NOTE — Assessment & Plan Note (Signed)
Chronic with acute exacerbation. Will check x-ray hips bilaterally. Concerned for muscular component. RICE discussed. Rx Tramadol. Will consider referral to sports medicine or ortho based on imaging results.

## 2016-01-19 NOTE — Progress Notes (Signed)
Pre visit review using our clinic review tool, if applicable. No additional management support is needed unless otherwise documented below in the visit note. 

## 2016-01-19 NOTE — Assessment & Plan Note (Signed)
Slightly above goal today secondary to pain. Will continue current regimen. Will check CMP today.

## 2016-01-23 ENCOUNTER — Telehealth: Payer: Self-pay | Admitting: Physician Assistant

## 2016-01-23 DIAGNOSIS — M25551 Pain in right hip: Secondary | ICD-10-CM

## 2016-01-23 NOTE — Telephone Encounter (Signed)
Pt called and states he checked with insurance and they will cover sports med. Please enter referral. Would like to stay in our building if possible.

## 2016-01-23 NOTE — Telephone Encounter (Signed)
Referral placed today

## 2016-01-25 ENCOUNTER — Encounter: Payer: Self-pay | Admitting: Family Medicine

## 2016-01-25 ENCOUNTER — Ambulatory Visit (INDEPENDENT_AMBULATORY_CARE_PROVIDER_SITE_OTHER): Payer: Medicare Other | Admitting: Family Medicine

## 2016-01-25 VITALS — BP 153/84 | HR 60 | Ht 68.0 in | Wt 167.0 lb

## 2016-01-25 DIAGNOSIS — M25551 Pain in right hip: Secondary | ICD-10-CM

## 2016-01-25 MED ORDER — DICLOFENAC SODIUM 75 MG PO TBEC: 75.0000 mg | DELAYED_RELEASE_TABLET | Freq: Two times a day (BID) | ORAL | Status: DC

## 2016-01-25 NOTE — Patient Instructions (Addendum)
You either have more arthritis than is seen on the plain x-rays or a labral tear of your hip. Both are treated similarly. These are the 4 classes of medicines you can take together for this: Tylenol 500mg  1-2 tabs three times a day for pain. Voltaren 75mg  twice a day with food for pain and inflammation. Glucosamine sulfate 750mg  twice a day is a supplement that may help. Capsaicin, aspercreme, or biofreeze topically up to four times a day may also help with pain. It's ok to fill the tramadol gave you also for pain relief when this is really bad. Cortisone injections are an option - call me if you want to go ahead with this. Standing hip rotations, side hip raises, straight leg raises all 3 sets of 10 once a day only (add ankle weight if these become too easy). Consider physical therapy to strengthen muscles around the joint that hurts to take pressure off of the joint itself. Heat or ice 15 minutes at a time 3-4 times a day as needed to help with pain. Follow up with me in 1 month though you can call me sooner if you want to set up an injection.

## 2016-01-26 NOTE — Progress Notes (Signed)
PCP and consultation requested by: Piedad Climes, PA-C  Subjective:   HPI: Patient is a 65 y.o. male here for right hip pain.  Patient reports he has had 3 weeks of pain in right groin, deep lateral hip. Pain worse with getting in and out of the car, trying to get dressed. Pain level 8/10, sharp. Has had problems with hips, knees since he was a teenager but usually just aching and this goes away. Taking aleve. Given rx from PCP but has not filled this. No radiation of pain. No skin changes, fever, other complaints.  Past Medical History  Diagnosis Date  . Anemia   . Arthritis   . Chicken pox   . Depression   . Chronic bronchitis (HCC)   . Hypertension   . Hyperlipidemia   . Rheumatic fever   . Vitamin B12 deficiency     Injections  . Bipolar 1 disorder [facility])     Current Outpatient Prescriptions on File Prior to Visit  Medication Sig Dispense Refill  . amLODipine (NORVASC) 10 MG tablet Take 1 tablet (10 mg total) by mouth daily. 90 tablet 1  . carbamazepine (TEGRETOL) 200 MG tablet Take 200 mg by mouth 3 (three) times daily.    . cyanocobalamin 1000 MCG tablet Take 100 mcg by mouth daily.    [name] lithium carbonate 300 MG capsule Take 300 mg by mouth 2 (two) times daily with a meal.     . lovastatin (MEVACOR) 40 MG tablet TAKE ONE TABLET BY MOUTH EVERY NIGHT AT BEDTIME 90 tablet 1  . omeprazole (PRILOSEC) 40 MG capsule TAKE ONE CAPSULE BY MOUTH EVERY MORNING 90 capsule 1  . traMADol (ULTRAM) 50 MG tablet Take 1 tablet (50 mg total) by mouth every 8 (eight) hours as needed. 30 tablet 0   No current facility-administered medications on file prior to visit.    Past Surgical History  Procedure Laterality Date  . Clavicle surgery      Right, Hardware placed  . Wisdom tooth extraction      No Known Allergies  Social History   Social History  . Marital Status: Married    Spouse Name: N/A  . Number of Children: N/A  . Years of Education: N/A   Occupational  History  . Not on file.   Social History Main Topics  . Smoking status: Former Games developer  . Smokeless tobacco: Not on file     Comment: Quit>40 years  . Alcohol Use: Not on file  . Drug Use: Not on file  . Sexual Activity: Not on file   Other Topics Concern  . Not on file   Social History Narrative    Family History  Problem Relation Age of Onset  . Stroke Father 34    Deceased  . Heart disease Maternal Grandfather 87    Deceased  . Hypertension Father   . Alcoholism Father   . Alzheimer's disease Mother 93    Deceased in early 68s  . Heart disease Paternal Grandfather 66    Deceased  . Alcoholism Brother   . Hypertension Son     BP 153/84 mmHg  Pulse 60  Ht 5\' 8"  (1.727 m)  Wt 167 lb (75.751 kg)  BMI 25.40 kg/m2  Review of Systems: See HPI above.    Objective:  Physical Exam:  Gen: NAD  Back: No gross deformity, scoliosis. No TTP .  No midline or bony TTP. FROM without pain. Strength LEs 5/5 all muscle groups.   2+  MSRs in patellar and achilles tendons, equal bilaterally. Negative SLRs. Sensation intact to light touch bilaterally.  Right hip: Positive logroll.  Negative on left. Negative fabers and piriformis stretches - pain in groin with fabers.    Assessment & Plan:  1. Right hip pain - independently reviewed radiographs and minimal DJD on these.  However, his exam strongly suggests intraarticular pathology - discussed labral tear, more arthritis present than seen on radiographs as most likely possibilities.  AVN unlikely.  He will start voltaren (discussed risk of increased lithium level - try taking once a day first then up to twice a day to ensure he tolerates).  Tylenol, glucosamine, topical medications.  Tramadol as needed.  He will call us if he would like to go ahead with intraarticular injection.  Shown home exercises to do daily.  Heat/ice.  F/u in 1 month.

## 2016-01-26 NOTE — Assessment & Plan Note (Signed)
independently reviewed radiographs and minimal DJD on these.  However, his exam strongly suggests intraarticular pathology - discussed labral tear, more arthritis present than seen on radiographs as most likely possibilities.  AVN unlikely.  He will start voltaren (discussed risk of increased lithium level - try taking once a day first then up to twice a day to ensure he tolerates).  Tylenol, glucosamine, topical medications.  Tramadol as needed.  He will call us if he would like to go ahead with intraarticular injection.  Shown home exercises to do daily.  Heat/ice.  F/u in 1 month.

## 2016-02-05 ENCOUNTER — Other Ambulatory Visit: Payer: Self-pay | Admitting: Physician Assistant

## 2016-02-08 ENCOUNTER — Other Ambulatory Visit: Payer: Self-pay | Admitting: Physician Assistant

## 2016-02-12 ENCOUNTER — Other Ambulatory Visit: Payer: Self-pay | Admitting: Physician Assistant

## 2016-02-21 ENCOUNTER — Ambulatory Visit: Payer: Self-pay | Admitting: Family Medicine

## 2016-03-18 DIAGNOSIS — M545 Low back pain: Secondary | ICD-10-CM | POA: Diagnosis not present

## 2016-03-18 DIAGNOSIS — M25551 Pain in right hip: Secondary | ICD-10-CM | POA: Diagnosis not present

## 2016-03-18 DIAGNOSIS — M51369 Other intervertebral disc degeneration, lumbar region without mention of lumbar back pain or lower extremity pain: Secondary | ICD-10-CM | POA: Insufficient documentation

## 2016-03-18 DIAGNOSIS — M5136 Other intervertebral disc degeneration, lumbar region: Secondary | ICD-10-CM | POA: Insufficient documentation

## 2016-03-18 HISTORY — DX: Other intervertebral disc degeneration, lumbar region without mention of lumbar back pain or lower extremity pain: M51.369

## 2016-03-18 HISTORY — DX: Other intervertebral disc degeneration, lumbar region: M51.36

## 2016-03-26 DIAGNOSIS — M25551 Pain in right hip: Secondary | ICD-10-CM | POA: Diagnosis not present

## 2016-03-26 DIAGNOSIS — M7071 Other bursitis of hip, right hip: Secondary | ICD-10-CM | POA: Diagnosis not present

## 2016-03-26 DIAGNOSIS — M67853 Other specified disorders of tendon, right hip: Secondary | ICD-10-CM | POA: Diagnosis not present

## 2016-04-02 DIAGNOSIS — M7061 Trochanteric bursitis, right hip: Secondary | ICD-10-CM | POA: Diagnosis not present

## 2016-04-30 DIAGNOSIS — F3132 Bipolar disorder, current episode depressed, moderate: Secondary | ICD-10-CM | POA: Diagnosis not present

## 2016-05-03 ENCOUNTER — Other Ambulatory Visit: Payer: Self-pay | Admitting: Physician Assistant

## 2016-05-08 ENCOUNTER — Other Ambulatory Visit: Payer: Self-pay | Admitting: Physician Assistant

## 2016-05-08 NOTE — Telephone Encounter (Signed)
Rx sent to the pharmacy by e-script.//AB/CMA 

## 2016-05-14 ENCOUNTER — Telehealth: Payer: Self-pay | Admitting: Physician Assistant

## 2016-05-14 DIAGNOSIS — M7551 Bursitis of right shoulder: Secondary | ICD-10-CM | POA: Diagnosis not present

## 2016-05-14 DIAGNOSIS — M7550 Bursitis of unspecified shoulder: Secondary | ICD-10-CM | POA: Diagnosis not present

## 2016-05-14 NOTE — Telephone Encounter (Signed)
Pt called in to schedule with Dr. Abner Greenspan. Pt says that he has seen his PCP about concern. Joint pain, swelling. Pt says that he was referred to a specialist. Pt says that he now would like to see MD that oversees his PCP . \

## 2016-05-14 NOTE — Telephone Encounter (Signed)
If he is wanting to see Dr. Abner Greenspan he can schedule with her. If he is wanting to transfer to her you need her permission. I am fine if he wants to transfer.

## 2016-05-15 NOTE — Telephone Encounter (Signed)
Pt says that he doesn't want to transfer PCP's he just feels like he should see a MD instead. Informed pt that PCP is able to do everything that a MD could. He would still like to see a MD.

## 2016-05-21 ENCOUNTER — Ambulatory Visit: Payer: Self-pay | Admitting: Family Medicine

## 2016-06-06 DIAGNOSIS — M2550 Pain in unspecified joint: Secondary | ICD-10-CM | POA: Diagnosis not present

## 2016-06-06 DIAGNOSIS — M25519 Pain in unspecified shoulder: Secondary | ICD-10-CM | POA: Diagnosis not present

## 2016-06-06 DIAGNOSIS — G8929 Other chronic pain: Secondary | ICD-10-CM | POA: Diagnosis not present

## 2016-06-06 DIAGNOSIS — M129 Arthropathy, unspecified: Secondary | ICD-10-CM | POA: Diagnosis not present

## 2016-06-06 DIAGNOSIS — Z79899 Other long term (current) drug therapy: Secondary | ICD-10-CM | POA: Diagnosis not present

## 2016-09-17 DIAGNOSIS — E559 Vitamin D deficiency, unspecified: Secondary | ICD-10-CM

## 2016-09-17 DIAGNOSIS — R7989 Other specified abnormal findings of blood chemistry: Secondary | ICD-10-CM | POA: Insufficient documentation

## 2016-09-17 DIAGNOSIS — E538 Deficiency of other specified B group vitamins: Secondary | ICD-10-CM | POA: Insufficient documentation

## 2016-09-17 HISTORY — DX: Other specified abnormal findings of blood chemistry: R79.89

## 2016-09-17 HISTORY — DX: Vitamin D deficiency, unspecified: E55.9

## 2018-03-23 DIAGNOSIS — J302 Other seasonal allergic rhinitis: Secondary | ICD-10-CM

## 2018-03-23 DIAGNOSIS — Z7960 Long term (current) use of unspecified immunomodulators and immunosuppressants: Secondary | ICD-10-CM | POA: Insufficient documentation

## 2018-03-23 DIAGNOSIS — Z79899 Other long term (current) drug therapy: Secondary | ICD-10-CM | POA: Insufficient documentation

## 2018-03-23 HISTORY — DX: Other seasonal allergic rhinitis: J30.2

## 2018-03-23 HISTORY — DX: Long term (current) use of unspecified immunomodulators and immunosuppressants: Z79.60

## 2018-03-23 HISTORY — DX: Other long term (current) drug therapy: Z79.899

## 2018-04-07 DIAGNOSIS — L439 Lichen planus, unspecified: Secondary | ICD-10-CM | POA: Insufficient documentation

## 2018-04-07 HISTORY — DX: Lichen planus, unspecified: L43.9

## 2018-04-29 ENCOUNTER — Encounter: Payer: Self-pay | Admitting: General Practice

## 2018-10-29 DIAGNOSIS — M069 Rheumatoid arthritis, unspecified: Secondary | ICD-10-CM

## 2018-10-29 HISTORY — DX: Rheumatoid arthritis, unspecified: M06.9

## 2018-11-02 ENCOUNTER — Encounter: Payer: Self-pay | Admitting: Emergency Medicine

## 2018-11-02 DIAGNOSIS — F411 Generalized anxiety disorder: Secondary | ICD-10-CM

## 2018-11-02 HISTORY — DX: Generalized anxiety disorder: F41.1

## 2018-11-16 ENCOUNTER — Ambulatory Visit: Payer: Medicare HMO | Admitting: Psychiatry

## 2018-11-16 ENCOUNTER — Encounter: Payer: Self-pay | Admitting: Psychiatry

## 2018-11-16 DIAGNOSIS — F411 Generalized anxiety disorder: Secondary | ICD-10-CM

## 2018-11-16 DIAGNOSIS — Z79899 Other long term (current) drug therapy: Secondary | ICD-10-CM | POA: Diagnosis not present

## 2018-11-16 DIAGNOSIS — F3132 Bipolar disorder, current episode depressed, moderate: Secondary | ICD-10-CM

## 2018-11-16 NOTE — Progress Notes (Signed)
Noah Grant [id number] 1951/06/05 67 y.o.  Subjective:   Patient ID:  Noah Grant is a 68 y.o. (DOB Dec 17, 1950) male.  Chief Complaint:  Chief Complaint  Patient presents with  . Follow-up    Medication management  . Other    Lithium level ordered    HPI Noah Grant presents to the office today for follow-up of bipolar, GAD, poor STM. Depression is somewhat chronic and waxes and wanes.  A lot health issues.  Avoids stressors if possible.  Patient reports stable mood and denies depressed or irritable moods except as noted.  Patient denies any recent difficulty with anxiety.  Patient denies difficulty with sleep initiation or maintenance. Denies appetite disturbance.  Patient reports that energy and motivation have been good.  Patient denies any difficulty with concentration.  Patient denies any suicidal ideation.  Has seen nephorologists and rheumatologists.  Htn not controlled.  Previous psych med trials are extensive and include meds for anxiety and mood.  These include clonidine, buspirone, Abilify 5 mg, temazepam, olanzapine, bupropion, Strattera, Depakote, Prozac, Ritalin, Serzone 700 mg daily, Provigil, sertraline, Geodon, lamotrigine, mirtazapine, Pamelor, amitriptyline 450 mg/day, duloxetine, Pristiq which caused rage, Cerefolin NAC, Ambien, ProSom, trazodone which was ineffective.  Review of Systems:  Review of Systems  Musculoskeletal: Positive for arthralgias.  Neurological: Positive for tremors and weakness.  Psychiatric/Behavioral: Positive for dysphoric mood. Negative for agitation, behavioral problems, confusion, decreased concentration, hallucinations, self-injury, sleep disturbance and suicidal ideas. The patient is nervous/anxious. The patient is not hyperactive.     Medications: I have reviewed the patient's current medications.  Current Outpatient Medications  Medication Sig Dispense Refill  . amLODipine (NORVASC) 10 MG tablet TAKE ONE (1) TABLET  BY MOUTH EVERY DAY 90 tablet 1  . carbamazepine (TEGRETOL) 200 MG tablet Take 200 mg by mouth 3 (three) times daily.    . cyanocobalamin 1000 MCG tablet Take 100 mcg by mouth daily.    [name] leflunomide (ARAVA) 20 MG tablet Take 20 mg by mouth daily.    [name] lithium carbonate 300 MG capsule Take 300 mg by mouth 2 (two) times daily with a meal.     . losartan (COZAAR) 50 MG tablet Take 50 mg by mouth daily.    [name] lovastatin (MEVACOR) 40 MG tablet TAKE ONE TABLET DAILY AT BEDTIME 90 tablet 1  . omeprazole (PRILOSEC) 40 MG capsule Take 1 capsule (40 mg total) by mouth every morning. 90 capsule 1   No current facility-administered medications for this visit.     Medication Side Effects: None  Allergies: No Known Allergies  Past Medical History:  Diagnosis Date  . Anemia   . Arthritis   . Bipolar 1 disorder (HCC)   . Chicken pox   . Chronic bronchitis (HCC)   . Depression   . Hyperlipidemia   . Hypertension   . Rheumatic fever   . Vitamin B12 deficiency    Injections    Family History  Problem Relation Age of Onset  . Stroke Father 41       Deceased  . Hypertension Father   . Alcoholism Father   . Heart disease Maternal Grandfather 77       Deceased  . Alzheimer's disease Mother 74       Deceased in early 77s  . Heart disease Paternal Grandfather 56       Deceased  . Alcoholism Brother   . Hypertension Son     Social History   Socioeconomic History  . Marital  status: Married    Spouse name: Not on file  . Number of children: Not on file  . Years of education: Not on file  . Highest education level: Not on file  Occupational History  . Not on file  Social Needs  . Financial resource strain: Not on file  . Food insecurity:    Worry: Not on file    Inability: Not on file  . Transportation needs:    Medical: Not on file    Non-medical: Not on file  Tobacco Use  . Smoking status: Former Smoker    Types: Cigarettes  . Smokeless tobacco: Never Used  . Tobacco comment:  Quit>40 years  Substance and Sexual Activity  . Alcohol use: Not on file  . Drug use: Not on file  . Sexual activity: Not on file  Lifestyle  . Physical activity:    Days per week: Not on file    Minutes per session: Not on file  . Stress: Not on file  Relationships  . Social connections:    Talks on phone: Not on file    Gets together: Not on file    Attends religious service: Not on file    Active member of club or organization: Not on file    Attends meetings of clubs or organizations: Not on file    Relationship status: Not on file  . Intimate partner violence:    Fear of current or ex partner: Not on file    Emotionally abused: Not on file    Physically abused: Not on file    Forced sexual activity: Not on file  Other Topics Concern  . Not on file  Social History Narrative  . Not on file    Past Medical History, Surgical history, Social history, and Family history were reviewed and updated as appropriate.   Please see review of systems for further details on the patient's review from today.   Objective:   Physical Exam:  There were no vitals taken for this visit.  Physical Exam  Lab Review:     Component Value Date/Time   NA 141 01/19/2016 0946   K 4.5 01/19/2016 0946   CL 106 01/19/2016 0946   CO2 32 01/19/2016 0946   GLUCOSE 99 01/19/2016 0946   BUN 17 01/19/2016 0946   CREATININE 1.15 01/19/2016 0946   CALCIUM 9.2 01/19/2016 0946   PROT 6.6 01/19/2016 0946   ALBUMIN 4.2 01/19/2016 0946   AST 12 01/19/2016 0946   ALT 15 01/19/2016 0946   ALKPHOS 67 01/19/2016 0946   BILITOT 0.4 01/19/2016 0946       Component Value Date/Time   WBC 4.4 08/16/2014 1013   RBC 4.18 (L) 08/16/2014 1013   HGB 13.1 08/16/2014 1013   HCT 38.8 (L) 08/16/2014 1013   PLT 225.0 08/16/2014 1013   MCV 92.6 08/16/2014 1013   MCHC 33.7 08/16/2014 1013   RDW 12.8 08/16/2014 1013    Lithium Lvl  Date Value Ref Range Status  08/16/2014 0.70 (L) 0.80 - 1.40 mEq/L Final      No results found for: PHENYTOIN, PHENOBARB, VALPROATE, CBMZ   .res Assessment: Plan:    Bipolar disorder with moderate depression (HCC) - Plan: Lithium level  Generalized anxiety disorder  Lithium use - Plan: Basic metabolic panel, TSH   He has failed multiple other psychiatric medications and does not wish to have further med changes today.  Check labs for lithium, BMP, TSH.  Records requested were not received.  Counseled patient regarding potential benefits, risks, and side effects of lithium to include potential risk of lithium affecting thyroid and renal function.  Discussed need for periodic lab monitoring to determine drug level and to assess for potential adverse effects.  Counseled patient regarding signs and symptoms of lithium toxicity and advised that they notify office immediately or seek urgent medical attention if experiencing these signs and symptoms.  Patient advised to contact office with any questions or concerns.   No changes indicated.  6 mos  Meredith Staggers, MD, DFAPA   Please see After Visit Summary for patient specific instructions.  No future appointments.  Orders Placed This Encounter  Procedures  . Lithium level  . Basic metabolic panel  . TSH      -------------------------------

## 2018-11-16 NOTE — Patient Instructions (Signed)
Get lab test and on the day of the test don't take morning lithium until after the blood test.

## 2018-11-27 ENCOUNTER — Other Ambulatory Visit: Payer: Self-pay

## 2018-11-27 MED ORDER — LITHIUM CARBONATE 300 MG PO CAPS: 300.0000 mg | ORAL_CAPSULE | Freq: Two times a day (BID) | ORAL | 1 refills | Status: DC

## 2018-12-08 LAB — TSH: TSH: 1.58 u[IU]/mL (ref 0.450–4.500)

## 2018-12-08 LAB — BASIC METABOLIC PANEL
BUN/Creatinine Ratio: 13 (ref 10–24)
BUN: 16 mg/dL (ref 8–27)
CALCIUM: 9.4 mg/dL (ref 8.6–10.2)
CO2: 24 mmol/L (ref 20–29)
Chloride: 104 mmol/L (ref 96–106)
Creatinine, Ser: 1.22 mg/dL (ref 0.76–1.27)
GFR calc non Af Amer: 61 mL/min/{1.73_m2} (ref >59)
GFR, EST AFRICAN AMERICAN: 70 mL/min/{1.73_m2} (ref >59)
GLUCOSE: 103 mg/dL — AB (ref 65–99)
POTASSIUM: 4.2 mmol/L (ref 3.5–5.2)
Sodium: 142 mmol/L (ref 134–144)

## 2018-12-29 DIAGNOSIS — N401 Enlarged prostate with lower urinary tract symptoms: Secondary | ICD-10-CM

## 2018-12-29 DIAGNOSIS — R351 Nocturia: Secondary | ICD-10-CM

## 2018-12-29 DIAGNOSIS — N138 Other obstructive and reflux uropathy: Secondary | ICD-10-CM

## 2018-12-29 HISTORY — DX: Benign prostatic hyperplasia with lower urinary tract symptoms: N40.1

## 2018-12-29 HISTORY — DX: Benign prostatic hyperplasia with lower urinary tract symptoms: R35.1

## 2018-12-29 HISTORY — DX: Benign prostatic hyperplasia with lower urinary tract symptoms: N13.8

## 2019-03-23 DIAGNOSIS — I739 Peripheral vascular disease, unspecified: Secondary | ICD-10-CM

## 2019-03-23 HISTORY — DX: Peripheral vascular disease, unspecified: I73.9

## 2019-03-24 ENCOUNTER — Other Ambulatory Visit: Payer: Self-pay

## 2019-03-24 MED ORDER — CARBAMAZEPINE 200 MG PO TABS: 200.0000 mg | ORAL_TABLET | Freq: Three times a day (TID) | ORAL | 0 refills | Status: DC

## 2019-03-25 DIAGNOSIS — Z860100 Personal history of colon polyps, unspecified: Secondary | ICD-10-CM

## 2019-03-25 DIAGNOSIS — K2270 Barrett's esophagus without dysplasia: Secondary | ICD-10-CM

## 2019-03-25 HISTORY — DX: Barrett's esophagus without dysplasia: K22.70

## 2019-03-25 HISTORY — DX: Personal history of colonic polyps: Z86.010

## 2019-03-25 HISTORY — DX: Personal history of colon polyps, unspecified: Z86.0100

## 2019-04-17 DIAGNOSIS — R0789 Other chest pain: Secondary | ICD-10-CM

## 2019-04-17 HISTORY — DX: Other chest pain: R07.89

## 2019-05-18 ENCOUNTER — Other Ambulatory Visit: Payer: Self-pay

## 2019-05-18 ENCOUNTER — Ambulatory Visit (INDEPENDENT_AMBULATORY_CARE_PROVIDER_SITE_OTHER): Payer: Medicare HMO | Admitting: Psychiatry

## 2019-05-18 ENCOUNTER — Encounter: Payer: Self-pay | Admitting: Psychiatry

## 2019-05-18 DIAGNOSIS — F3132 Bipolar disorder, current episode depressed, moderate: Secondary | ICD-10-CM

## 2019-05-18 DIAGNOSIS — F411 Generalized anxiety disorder: Secondary | ICD-10-CM | POA: Diagnosis not present

## 2019-05-18 DIAGNOSIS — Z79899 Other long term (current) drug therapy: Secondary | ICD-10-CM | POA: Diagnosis not present

## 2019-05-18 MED ORDER — CARBAMAZEPINE 200 MG PO TABS: 200.0000 mg | ORAL_TABLET | Freq: Three times a day (TID) | ORAL | 1 refills | Status: DC

## 2019-05-18 MED ORDER — LITHIUM CARBONATE 300 MG PO CAPS: 300.0000 mg | ORAL_CAPSULE | Freq: Two times a day (BID) | ORAL | 1 refills | Status: DC

## 2019-05-18 NOTE — Progress Notes (Signed)
Noah Grant [id number] 09-27-1951 68 y.o.  Subjective:   Patient ID:  Noah Grant is a 68 y.o. (DOB 09-27-1951) male.  Chief Complaint:  Chief Complaint  Patient presents with  . Follow-up    Medication Management  . Anxiety    Medication Management  . Depression    Medication Management  . Medication Refill    Lithium and Carbamazepine    Anxiety  Symptoms include nervous/anxious behavior. Patient reports no confusion, decreased concentration or suicidal ideas.    Depression         Associated symptoms include no decreased concentration and no suicidal ideas.  Past medical history includes anxiety.   Medication Refill  Associated symptoms include arthralgias and weakness.   Noah Grant presents to the office today for follow-up of bipolar, GAD, poor STM.  Last seen December without med changes.  Overall mentally ok but too many medical concerns with GI and RA.  Started Humera 6 weeks ago.  No changes so far.  Chronic pain issues ongoing.  Possibly also early PD.  Neuro eval July 6.  Htn not well controlled.  Depression is somewhat chronic and waxes and wanes.  A lot health issues.  Avoids stressors if possible.  Satisfied with psych meds.   Patient reports stable mood and denies depressed or irritable moods except as noted.  Patient denies any recent difficulty with anxiety except situational.  Patient denies unusual difficulty with sleep initiation or maintenance except awakens with pain chronically. Denies appetite disturbance.  Patient reports that energy and motivation have been good.  Patient denies any difficulty with concentration.  Patient denies any suicidal ideation.  Has seen nephorologists and rheumatologists.  Htn not controlled.  Previous psych med trials are extensive and include meds for anxiety and mood.  These include clonidine, buspirone, Abilify 5 mg, temazepam, olanzapine, bupropion, Strattera, Depakote, Prozac, Ritalin, Serzone 700 mg  daily, Provigil, sertraline, Geodon, lamotrigine, mirtazapine, Pamelor, amitriptyline 450 mg/day, duloxetine, Pristiq which caused rage, Cerefolin NAC, Ambien, ProSom, trazodone which was ineffective.  Review of Systems:  Review of Systems  Musculoskeletal: Positive for arthralgias.  Neurological: Positive for tremors and weakness.  Psychiatric/Behavioral: Positive for depression and dysphoric mood. Negative for agitation, behavioral problems, confusion, decreased concentration, hallucinations, self-injury, sleep disturbance and suicidal ideas. The patient is nervous/anxious. The patient is not hyperactive.     Medications: I have reviewed the patient's current medications.  Current Outpatient Medications  Medication Sig Dispense Refill  . Adalimumab 40 MG/0.8ML PSKT Inject into the skin.    [name]. amLODipine (NORVASC) 10 MG tablet TAKE ONE (1) TABLET BY MOUTH EVERY DAY 90 tablet 1  . carbamazepine (TEGRETOL) 200 MG tablet Take 1 tablet (200 mg total) by mouth 3 (three) times daily. 270 tablet 1  . Cholecalciferol 50 MCG (2000 UT) CAPS Take by mouth.    . labetalol (NORMODYNE) 100 MG tablet Take 100 mg by mouth 2 (two) times daily.    [name]. leflunomide (ARAVA) 20 MG tablet Take 20 mg by mouth daily.    [name]. lithium carbonate 300 MG capsule Take 1 capsule (300 mg total) by mouth 2 (two) times daily with a meal. 180 capsule 1  . losartan (COZAAR) 50 MG tablet Take 50 mg by mouth daily.    [name]. lovastatin (MEVACOR) 40 MG tablet TAKE ONE TABLET DAILY AT BEDTIME 90 tablet 1  . Lysine HCl 1000 MG TABS Take by mouth.    [name]. omeprazole (PRILOSEC) 40 MG capsule Take 1 capsule (40 mg  total) by mouth every morning. 90 capsule 1   No current facility-administered medications for this visit.     Medication Side Effects: None  Allergies:  Allergies  Allergen Reactions  . Hydroxychloroquine Other (See Comments)    Terrible Nightmares  . Methotrexate Dermatitis    Developed Mouth Sores  . Nsaids Dermatitis     Developed Mouth Blisters    Past Medical History:  Diagnosis Date  . Anemia   . Arthritis   . Bipolar 1 disorder (HCC)   . Chicken pox   . Chronic bronchitis (HCC)   . Depression   . Hyperlipidemia   . Hypertension   . Rheumatic fever   . Vitamin B12 deficiency    Injections    Family History  Problem Relation Age of Onset  . Stroke Father 37       Deceased  . Hypertension Father   . Alcoholism Father   . Heart disease Maternal Grandfather 19       Deceased  . Alzheimer's disease Mother 73       Deceased in early 42s  . Heart disease Paternal Grandfather 76       Deceased  . Alcoholism Brother   . Hypertension Son     Social History   Socioeconomic History  . Marital status: Married    Spouse name: Not on file  . Number of children: Not on file  . Years of education: Not on file  . Highest education level: Not on file  Occupational History  . Not on file  Social Needs  . Financial resource strain: Not on file  . Food insecurity:    Worry: Not on file    Inability: Not on file  . Transportation needs:    Medical: Not on file    Non-medical: Not on file  Tobacco Use  . Smoking status: Former Smoker    Types: Cigarettes  . Smokeless tobacco: Never Used  . Tobacco comment: Quit>40 years  Substance and Sexual Activity  . Alcohol use: Not on file  . Drug use: Not on file  . Sexual activity: Not on file  Lifestyle  . Physical activity:    Days per week: Not on file    Minutes per session: Not on file  . Stress: Not on file  Relationships  . Social connections:    Talks on phone: Not on file    Gets together: Not on file    Attends religious service: Not on file    Active member of club or organization: Not on file    Attends meetings of clubs or organizations: Not on file    Relationship status: Not on file  . Intimate partner violence:    Fear of current or ex partner: Not on file    Emotionally abused: Not on file    Physically abused: Not on  file    Forced sexual activity: Not on file  Other Topics Concern  . Not on file  Social History Narrative  . Not on file    Past Medical History, Surgical history, Social history, and Family history were reviewed and updated as appropriate.   Please see review of systems for further details on the patient's review from today.   Objective:   Physical Exam:  There were no vitals taken for this visit.  Physical Exam Constitutional:      General: He is not in acute distress.    Appearance: He is well-developed.  Musculoskeletal:  General: No deformity.  Neurological:     Mental Status: He is alert and oriented to person, place, and time.     Coordination: Coordination normal.  Psychiatric:        Attention and Perception: Attention normal. He is attentive.        Mood and Affect: Mood normal. Mood is not anxious or depressed. Affect is not labile, blunt, angry or inappropriate.        Speech: Speech normal.        Behavior: Behavior normal.        Thought Content: Thought content normal. Thought content does not include homicidal or suicidal ideation. Thought content does not include homicidal or suicidal plan.        Cognition and Memory: Cognition normal.        Judgment: Judgment normal.     Comments: Insight is good. Good humor.     Lab Review:     Component Value Date/Time   NA 142 12/07/2018 0841   K 4.2 12/07/2018 0841   CL 104 12/07/2018 0841   CO2 24 12/07/2018 0841   GLUCOSE 103 (H) 12/07/2018 0841   GLUCOSE 99 01/19/2016 0946   BUN 16 12/07/2018 0841   CREATININE 1.22 12/07/2018 0841   CALCIUM 9.4 12/07/2018 0841   PROT 6.6 01/19/2016 0946   ALBUMIN 4.2 01/19/2016 0946   AST 12 01/19/2016 0946   ALT 15 01/19/2016 0946   ALKPHOS 67 01/19/2016 0946   BILITOT 0.4 01/19/2016 0946   GFRNONAA 61 12/07/2018 0841   GFRAA 70 12/07/2018 0841       Component Value Date/Time   WBC 4.4 08/16/2014 1013   RBC 4.18 (L) 08/16/2014 1013   HGB 13.1  08/16/2014 1013   HCT 38.8 (L) 08/16/2014 1013   PLT 225.0 08/16/2014 1013   MCV 92.6 08/16/2014 1013   MCHC 33.7 08/16/2014 1013   RDW 12.8 08/16/2014 1013    Lithium Lvl  Date Value Ref Range Status  08/16/2014 0.70 (L) 0.80 - 1.40 mEq/L Final     No results found for: PHENYTOIN, PHENOBARB, VALPROATE, CBMZ   .res Assessment: Plan:    Bipolar disorder with moderate depression ([facility])  Generalized anxiety disorder  Lithium use   He has failed multiple other psychiatric medications and does not wish to have further med changes today.  Check labs for lithium, BMP, TSH.  Records requested were not received.  Counseled patient regarding potential benefits, risks, and side effects of lithium to include potential risk of lithium affecting thyroid and renal function.  Discussed need for periodic lab monitoring to determine drug level and to assess for potential adverse effects.  Counseled patient regarding signs and symptoms of lithium toxicity and advised that they notify office immediately or seek urgent medical attention if experiencing these signs and symptoms.  Patient advised to contact office with any questions or concerns.  Disc DDI with [facility] and to inform his doctors about this.  No changes indicated.  He's failed multiple others.  He said he did lithium level this year at [employer] [facility].  Get level  6 mos  Lynder Parents, MD, DFAPA   Please see After Visit Summary for patient specific instructions.  No future appointments.  No orders of the defined types were placed in this encounter.     -------------------------------

## 2019-06-16 DIAGNOSIS — M5414 Radiculopathy, thoracic region: Secondary | ICD-10-CM

## 2019-06-16 DIAGNOSIS — R251 Tremor, unspecified: Secondary | ICD-10-CM

## 2019-06-16 DIAGNOSIS — G252 Other specified forms of tremor: Secondary | ICD-10-CM | POA: Insufficient documentation

## 2019-06-16 HISTORY — DX: Radiculopathy, thoracic region: M54.14

## 2019-06-16 HISTORY — DX: Tremor, unspecified: R25.1

## 2019-06-16 HISTORY — DX: Other specified forms of tremor: G25.2

## 2019-07-21 DIAGNOSIS — G249 Dystonia, unspecified: Secondary | ICD-10-CM | POA: Insufficient documentation

## 2019-07-21 DIAGNOSIS — N1831 Chronic kidney disease, stage 3a: Secondary | ICD-10-CM | POA: Insufficient documentation

## 2019-07-21 HISTORY — DX: Chronic kidney disease, stage 3a: N18.31

## 2019-11-09 ENCOUNTER — Other Ambulatory Visit: Payer: Self-pay

## 2019-11-10 ENCOUNTER — Other Ambulatory Visit: Payer: Self-pay

## 2019-11-10 ENCOUNTER — Ambulatory Visit (INDEPENDENT_AMBULATORY_CARE_PROVIDER_SITE_OTHER): Payer: Medicare HMO | Admitting: Family Medicine

## 2019-11-10 ENCOUNTER — Encounter: Payer: Self-pay | Admitting: Family Medicine

## 2019-11-10 VITALS — BP 138/78 | HR 53 | Temp 97.4°F | Ht 68.0 in | Wt 169.0 lb

## 2019-11-10 DIAGNOSIS — E785 Hyperlipidemia, unspecified: Secondary | ICD-10-CM | POA: Diagnosis not present

## 2019-11-10 DIAGNOSIS — R109 Unspecified abdominal pain: Secondary | ICD-10-CM | POA: Diagnosis not present

## 2019-11-10 DIAGNOSIS — I10 Essential (primary) hypertension: Secondary | ICD-10-CM

## 2019-11-10 DIAGNOSIS — F319 Bipolar disorder, unspecified: Secondary | ICD-10-CM

## 2019-11-10 DIAGNOSIS — Z8673 Personal history of transient ischemic attack (TIA), and cerebral infarction without residual deficits: Secondary | ICD-10-CM

## 2019-11-10 DIAGNOSIS — N183 Chronic kidney disease, stage 3 unspecified: Secondary | ICD-10-CM

## 2019-11-10 HISTORY — DX: Personal history of transient ischemic attack (TIA), and cerebral infarction without residual deficits: Z86.73

## 2019-11-10 NOTE — Patient Instructions (Addendum)
Stop the lovastatin for the next 3 weeks.   Stay hydrated.   Keep the diet clean and stay active.  Let us know if you need anything.

## 2019-11-10 NOTE — Progress Notes (Signed)
Chief Complaint  Patient presents with  . New Patient (Initial Visit)       New Patient Visit SUBJECTIVE: HPI: Noah Grant is an 68 y.o.male who is being seen for establishing care.  The patient was previously seen at [facility].  Over the past 18 months, the patient has had intermittent right-sided pain.  No injury or change in activity.  There was no medication changes around that time.  He does follow with gastroenterology and they said it was nothing related to that.  There are no skin changes over the area.  The pain comes randomly and last for around 1 week.  After it comes, it will spread to his back and other side.  Nothing he notices makes it better or worse.  He has not tried anything for this so far.  He does feel like it is pulling when he moves his legs.  Hypertension Patient presents for hypertension follow up. He does monitor home blood pressures. Blood pressures ranging on average from 130's/70-80's. He is compliant with medications-grams daily, hydralazine 50 mg 3 times daily, labetalol 100 mg twice daily. Patient has these side effects of medication: none He is adhering to a healthy diet overall. Exercise: Some walking  Hyperlipidemia Patient presents for dyslipidemia follow up. Currently being treated with lovastatin 40 mg daily and compliance with treatment thus far has been good. Diet and exercise above The patient is not known to have coexisting coronary artery disease. He has had a stroke in the past.  Past Medical History:  Diagnosis Date  . Anemia   . Arthritis   . Bipolar 1 disorder (HCC)   . Chicken pox   . Chronic bronchitis (HCC)   . Depression   . Hyperlipidemia   . Hypertension   . Rheumatic fever   . Vitamin B12 deficiency    Injections   Past Surgical History:  Procedure Laterality Date  . CLAVICLE SURGERY     Right, Hardware placed  . WISDOM TOOTH EXTRACTION     Family History  Problem Relation Age of Onset  . Stroke Father 57        Deceased  . Hypertension Father   . Alcoholism Father   . Heart disease Maternal Grandfather 74       Deceased  . Alzheimer's disease Mother 30       Deceased in early 55s  . Heart disease Paternal Grandfather 60       Deceased  . Alcoholism Brother   . Hypertension Son    Allergies  Allergen Reactions  . Hydroxychloroquine Other (See Comments)    Terrible Nightmares  . Methotrexate Dermatitis    Developed Mouth Sores  . Nsaids Dermatitis    Developed Mouth Blisters    Current Outpatient Medications:  .  amLODipine (NORVASC) 10 MG tablet, TAKE ONE (1) TABLET BY MOUTH EVERY DAY, Disp: 90 tablet, Rfl: 1 .  aspirin EC 81 MG tablet, Take 81 mg by mouth daily., Disp: , Rfl:  .  carbamazepine (TEGRETOL) 200 MG tablet, Take 1 tablet (200 mg total) by mouth 3 (three) times daily., Disp: 270 tablet, Rfl: 1 .  Cholecalciferol 50 MCG (2000 UT) CAPS, Take by mouth., Disp: , Rfl:  .  hydrALAZINE (APRESOLINE) 50 MG tablet, Take 50 mg by mouth 3 (three) times daily., Disp: , Rfl:  .  labetalol (NORMODYNE) 100 MG tablet, Take 100 mg by mouth 2 (two) times daily., Disp: , Rfl:  .  leflunomide (ARAVA) 20 MG tablet, Take  20 mg by mouth daily., Disp: , Rfl:  .  lithium carbonate 300 MG capsule, Take 1 capsule (300 mg total) by mouth 2 (two) times daily with a meal., Disp: 180 capsule, Rfl: 1 .  losartan (COZAAR) 100 MG tablet, Take 100 mg by mouth daily., Disp: , Rfl:  .  lovastatin (MEVACOR) 40 MG tablet, TAKE ONE TABLET DAILY AT BEDTIME, Disp: 90 tablet, Rfl: 1 .  Lysine HCl 1000 MG TABS, Take by mouth., Disp: , Rfl:  .  omeprazole (PRILOSEC) 40 MG capsule, Take 1 capsule (40 mg total) by mouth every morning., Disp: 90 capsule, Rfl: 1  No LMP for male patient.  ROS Constitutional: No fevers GI: No bowel changes GU: No pain. Cardiac: No chest pain Lungs: No shortness of breath Psych: Reports mood is stable Skin: No rashes MSK: No jt pain Endo: No weight loss Neuro: No  headaches  OBJECTIVE: BP 138/78 (BP Location: Left Arm, Patient Position: Sitting, Cuff Size: Normal)   Pulse (!) 53   Temp (!) 97.4 F (36.3 C) (Temporal)   Ht 5\' 8"  (1.727 m)   Wt 169 lb (76.7 kg)   SpO2 96%   BMI 25.70 kg/m  General:  well developed, well nourished, in no apparent distress Skin:  no significant moles, warts, or growths Head:  no masses, lesions, or tenderness Eyes:  pupils equal and round, sclera anicteric without injection Ears:  canals without lesions, TMs shiny without retraction, no obvious effusion, no erythema Nose:  nares patent, septum midline, mucosa normal Throat/Pharynx:  lips and gingiva without lesion; tongue and uvula midline; non-inflamed pharynx; no exudates or postnasal drainage Abd: BS+, S, ND, ttp in R abd/wall area, no masses Lungs:  clear to auscultation, breath sounds equal bilaterally, no respiratory distress Cardio:  regular rate and rhythm, no LE edema or bruits Rectal: Deferred Musculoskeletal: +ttp over the R parasp msc in lower thoracics and ES group just lateral Neuro:  gait normal; deep tendon reflexes normal and symmetric Psych: well oriented with normal range of affect and appropriate judgment/insight  ASSESSMENT/PLAN: Abdominal wall pain  Essential hypertension  Hyperlipidemia, unspecified hyperlipidemia type  Stage 3 chronic kidney disease, unspecified whether stage 3a or 3b CKD  Bipolar disease, chronic (HCC)  History of stroke  1-could be related to statin use.  Needs to increase fluid intake.  We will go on a drug holiday from lovastatin for the next 3 weeks. 2-continue current medications. 3-continue statin and aspirin. 4/5/6-continue with respective specialist. Patient should return in 3 weeks to reck.  If no improvement, will get him set up with the physical therapy team. The patient voiced understanding and agreement to the plan.   [place], DO 11/10/19  1:17 PM

## 2019-11-16 ENCOUNTER — Telehealth: Payer: Self-pay | Admitting: Family Medicine

## 2019-11-16 NOTE — Telephone Encounter (Signed)
Hx of Bipolar 2, tx by Dr. Rejeana Brock of psychiatry. 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

## 2019-11-17 ENCOUNTER — Other Ambulatory Visit: Payer: Self-pay

## 2019-11-17 ENCOUNTER — Encounter: Payer: Self-pay | Admitting: Psychiatry

## 2019-11-17 ENCOUNTER — Ambulatory Visit (INDEPENDENT_AMBULATORY_CARE_PROVIDER_SITE_OTHER): Payer: Medicare HMO | Admitting: Psychiatry

## 2019-11-17 DIAGNOSIS — Z79899 Other long term (current) drug therapy: Secondary | ICD-10-CM | POA: Diagnosis not present

## 2019-11-17 DIAGNOSIS — F411 Generalized anxiety disorder: Secondary | ICD-10-CM | POA: Diagnosis not present

## 2019-11-17 DIAGNOSIS — F3132 Bipolar disorder, current episode depressed, moderate: Secondary | ICD-10-CM

## 2019-11-17 MED ORDER — LITHIUM CARBONATE 300 MG PO CAPS: 300.0000 mg | ORAL_CAPSULE | Freq: Two times a day (BID) | ORAL | 1 refills | Status: DC

## 2019-11-17 MED ORDER — CARBAMAZEPINE 200 MG PO TABS: 200.0000 mg | ORAL_TABLET | Freq: Three times a day (TID) | ORAL | 1 refills | Status: DC

## 2019-11-17 NOTE — Progress Notes (Signed)
[name] [id number] Jul 19, 1951 68 y.o.  Subjective:   Patient ID:  Noah Grant is a 68 y.o. (DOB 12/09/1951) male.  Chief Complaint:  Chief Complaint  Patient presents with  . Follow-up    Medication Management  . Depression    Medication Management  . Anxiety    Medication Management  . Other    Bipolar disorder.  . Medication Refill    Lithium, Carbamazepine.    Anxiety Symptoms include nervous/anxious behavior. Patient reports no confusion, decreased concentration or suicidal ideas.    Depression        Associated symptoms include no decreased concentration and no suicidal ideas.  Past medical history includes anxiety.   Medication Refill Associated symptoms include arthralgias and weakness.   Noah Grant presents to the office today for follow-up of bipolar, GAD, poor STM.  Last seen May 18, 2019 without med changes.  Lithium level had been ordered but not received.  Overall mentally ok but too many medical concerns with RA and muscle pain.   Chronic pain issues ongoing.  Possibly also early PD.  Noah Grant  Htn controlled.  Depression is somewhat chronic and waxes and wanes.  A lot health issues.  Avoids stressors if possible.  Satisfied with psych meds. He's tried a lot of different meds as noted and doesn't want more changes.  Patient reports stable mood and denies depressed or irritable moods except as noted.  Patient denies any recent difficulty with anxiety except situational.  Patient denies unusual difficulty with sleep initiation or maintenance except awakens with pain chronically. Denies appetite disturbance.  Patient reports that energy and motivation have been good.  Patient denies any difficulty with concentration  But history of word finding from ministrokes..  Patient denies any suicidal ideation.  Has seen nephorologists and rheumatologists and neurologist, [facility].   Lives in [place] now  Previous psych med trials are extensive and  include meds for anxiety and mood.  These include clonidine, buspirone, Abilify 5 mg, temazepam, olanzapine, bupropion, Strattera, Depakote, Prozac, Ritalin, Serzone 700 mg daily, Provigil, sertraline, Geodon, lamotrigine, mirtazapine, Pamelor, amitriptyline 450 mg/day, duloxetine, Pristiq which caused rage, Cerefolin NAC, Ambien, ProSom, trazodone which was ineffective.  Review of Systems:  Review of Systems  Musculoskeletal: Positive for arthralgias.  Neurological: Positive for tremors and weakness.  Psychiatric/Behavioral: Positive for depression and dysphoric mood. Negative for agitation, behavioral problems, confusion, decreased concentration, hallucinations, self-injury, sleep disturbance and suicidal ideas. The patient is nervous/anxious. The patient is not hyperactive.     Medications: I have reviewed the patient's current medications.  Current Outpatient Medications  Medication Sig Dispense Refill  . amLODipine (NORVASC) 10 MG tablet TAKE ONE (1) TABLET BY MOUTH EVERY DAY 90 tablet 1  . aspirin EC 81 MG tablet Take 81 mg by mouth daily.    . carbamazepine (TEGRETOL) 200 MG tablet Take 1 tablet (200 mg total) by mouth 3 (three) times daily. 270 tablet 1  . Cholecalciferol 50 MCG (2000 UT) CAPS Take by mouth.    . hydrALAZINE (APRESOLINE) 50 MG tablet Take 50 mg by mouth 3 (three) times daily.    Noah Grant labetalol (NORMODYNE) 100 MG tablet Take 100 mg by mouth 2 (two) times daily.    Noah Grant leflunomide (ARAVA) 20 MG tablet Take 20 mg by mouth daily.    Noah Grant lithium carbonate 300 MG capsule Take 1 capsule (300 mg total) by mouth 2 (two) times daily with a meal. 180 capsule 1  . losartan (COZAAR) 100  MG tablet Take 100 mg by mouth daily.    Noah Grant. lovastatin (MEVACOR) 40 MG tablet TAKE ONE TABLET DAILY AT BEDTIME 90 tablet 1  . Lysine HCl 1000 MG TABS Take by mouth 2 (two) times daily.     Noah Grant. omeprazole (PRILOSEC) 40 MG capsule Take 1 capsule (40 mg total) by mouth every morning. 90 capsule 1   No current  facility-administered medications for this visit.     Medication Side Effects: None  Allergies:  Allergies  Allergen Reactions  . Hydroxychloroquine Other (See Comments)    Terrible Nightmares  . Methotrexate Dermatitis    Developed Mouth Sores  . Nsaids Dermatitis    Developed Mouth Blisters    Past Medical History:  Diagnosis Date  . Anemia   . Arthritis   . Bipolar 1 disorder (HCC)   . Chicken pox   . Chronic bronchitis (HCC)   . Depression   . Hyperlipidemia   . Hypertension   . Rheumatic fever   . Vitamin B12 deficiency    Injections    Family History  Problem Relation Age of Onset  . Stroke Father [id number]       Deceased  . Hypertension Father   . Alcoholism Father   . Heart disease Maternal Grandfather [id number]       Deceased  . Alzheimer's disease Mother 2270       Deceased in early 2070s  . Heart disease Paternal Grandfather [phone]       Deceased  . Alcoholism Brother   . Hypertension Son     Social History   Socioeconomic History  . Marital status: Married    Spouse name: Not on file  . Number of children: Not on file  . Years of education: Not on file  . Highest education level: Not on file  Occupational History  . Not on file  Social Needs  . Financial resource strain: Not on file  . Food insecurity    Worry: Not on file    Inability: Not on file  . Transportation needs    Medical: Not on file    Non-medical: Not on file  Tobacco Use  . Smoking status: Former Smoker    Types: Cigarettes  . Smokeless tobacco: Never Used  . Tobacco comment: Quit>40 years  Substance and Sexual Activity  . Alcohol use: Not on file  . Drug use: Not on file  . Sexual activity: Not on file  Lifestyle  . Physical activity    Days per week: Not on file    Minutes per session: Not on file  . Stress: Not on file  Relationships  . Social Musicianconnections    Talks on phone: Not on file    Gets together: Not on file    Attends religious service: Not on file    Active member of  club or organization: Not on file    Attends meetings of clubs or organizations: Not on file    Relationship status: Not on file  . Intimate partner violence    Fear of current or ex partner: Not on file    Emotionally abused: Not on file    Physically abused: Not on file    Forced sexual activity: Not on file  Other Topics Concern  . Not on file  Social History Narrative  . Not on file    Past Medical History, Surgical history, Social history, and Family history were reviewed and updated as appropriate.   Please see review of  systems for further details on the patient's review from today.   Objective:   Physical Exam:  There were no vitals taken for this visit.  Physical Exam Constitutional:      General: He is not in acute distress.    Appearance: He is well-developed.  Musculoskeletal:        General: No deformity.  Neurological:     Mental Status: He is alert and oriented to person, place, and time.     Coordination: Coordination normal.  Psychiatric:        Attention and Perception: Attention normal. He is attentive.        Mood and Affect: Mood normal. Mood is not anxious or depressed. Affect is not labile, blunt, angry or inappropriate.        Speech: Speech normal.        Behavior: Behavior normal.        Thought Content: Thought content normal. Thought content does not include homicidal or suicidal ideation. Thought content does not include homicidal or suicidal plan.        Cognition and Memory: Cognition normal.        Judgment: Judgment normal.     Comments: Insight is good. Good humor.     Lab Review:     Component Value Date/Time   NA 142 12/07/2018 0841   K 4.2 12/07/2018 0841   CL 104 12/07/2018 0841   CO2 24 12/07/2018 0841   GLUCOSE 103 (H) 12/07/2018 0841   GLUCOSE 99 01/19/2016 0946   BUN 16 12/07/2018 0841   CREATININE 1.22 12/07/2018 0841   CALCIUM 9.4 12/07/2018 0841   PROT 6.6 01/19/2016 0946   ALBUMIN 4.2 01/19/2016 0946   AST 12  01/19/2016 0946   ALT 15 01/19/2016 0946   ALKPHOS 67 01/19/2016 0946   BILITOT 0.4 01/19/2016 0946   GFRNONAA 61 12/07/2018 0841   GFRAA 70 12/07/2018 0841       Component Value Date/Time   WBC 4.4 08/16/2014 1013   RBC 4.18 (L) 08/16/2014 1013   HGB 13.1 08/16/2014 1013   HCT 38.8 (L) 08/16/2014 1013   PLT 225.0 08/16/2014 1013   MCV 92.6 08/16/2014 1013   MCHC 33.7 08/16/2014 1013   RDW 12.8 08/16/2014 1013    Lithium Lvl  Date Value Ref Range Status  08/16/2014 0.70 (L) 0.80 - 1.40 mEq/L Final     No results found for: PHENYTOIN, PHENOBARB, VALPROATE, CBMZ   .res Assessment: Plan:    Bipolar disorder with moderate depression (HCC) - Plan: Lithium level, carbamazepine (TEGRETOL) 200 MG tablet, lithium carbonate 300 MG capsule  Generalized anxiety disorder  Lithium use - Plan: Lithium level   He has failed multiple other psychiatric medications and does not wish to have further med changes today.  Check labs for lithium, BMP, TSH.  Records requested were not received.  No lithium level within the last year in epic.  Counseled patient regarding potential benefits, risks, and side effects of lithium to include potential risk of lithium affecting thyroid and renal function.  Discussed need for periodic lab monitoring to determine drug level and to assess for potential adverse effects.  Counseled patient regarding signs and symptoms of lithium toxicity and advised that they notify office immediately or seek urgent medical attention if experiencing these signs and symptoms.  Patient advised to contact office with any questions or concerns.  Disc DDI with [facility] and to inform his doctors about this.  No changes indicated.  He's failed multiple  others.  6 mos  Meredith Staggersarey Cottle, MD, DFAPA   Please see After Visit Summary for patient specific instructions.  Future Appointments  Date Time Provider Department Center  12/01/2019 10:30 AM [name], Jilda RocheNicholas Paul, DO [id number]     Orders Placed This Encounter  Procedures  . Lithium level      -------------------------------

## 2019-11-30 ENCOUNTER — Other Ambulatory Visit: Payer: Self-pay

## 2019-12-01 ENCOUNTER — Other Ambulatory Visit: Payer: Self-pay | Admitting: Family Medicine

## 2019-12-01 ENCOUNTER — Ambulatory Visit (INDEPENDENT_AMBULATORY_CARE_PROVIDER_SITE_OTHER): Payer: Medicare HMO | Admitting: Family Medicine

## 2019-12-01 ENCOUNTER — Encounter: Payer: Self-pay | Admitting: Family Medicine

## 2019-12-01 ENCOUNTER — Telehealth: Payer: Self-pay | Admitting: *Deleted

## 2019-12-01 VITALS — BP 140/82 | HR 56 | Temp 97.1°F | Ht 67.5 in | Wt 171.0 lb

## 2019-12-01 DIAGNOSIS — E785 Hyperlipidemia, unspecified: Secondary | ICD-10-CM | POA: Diagnosis not present

## 2019-12-01 DIAGNOSIS — G720 Drug-induced myopathy: Secondary | ICD-10-CM | POA: Diagnosis not present

## 2019-12-01 DIAGNOSIS — T466X5A Adverse effect of antihyperlipidemic and antiarteriosclerotic drugs, initial encounter: Secondary | ICD-10-CM

## 2019-12-01 LAB — LIPID PANEL
Cholesterol: 168 mg/dL (ref 0–200)
HDL: 28.6 mg/dL — ABNORMAL LOW (ref >39.00)
NonHDL: 139.27
Total CHOL/HDL Ratio: 6
Triglycerides: 274 mg/dL — ABNORMAL HIGH (ref 0.0–149.0)
VLDL: 54.8 mg/dL — ABNORMAL HIGH (ref 0.0–40.0)

## 2019-12-01 LAB — LDL CHOLESTEROL, DIRECT: Direct LDL: 99 mg/dL

## 2019-12-01 MED ORDER — ROSUVASTATIN CALCIUM 5 MG PO TABS: 5.0000 mg | ORAL_TABLET | Freq: Every day | ORAL | 3 refills | Status: DC

## 2019-12-01 NOTE — Patient Instructions (Signed)
Give [place] 2-3 business days to get the results of your labs back.   Keep the diet clean and stay active.  Stay hydrated.   I would consider a low dose statin to prevent heart attack/stroke depending on what you results are.  Let us know if you need anything.

## 2019-12-01 NOTE — Progress Notes (Signed)
Chief Complaint  Patient presents with  . Follow-up    Subjective: Patient is a 68 y.o. male here for f/u abd pain.  Seen 3 weeks ago for abd wall pain. Stopped lovastatin and told him to increase fluid intake. Since that time, he reports near full improvement. He is staying hydrated. No new s/s's. He has a hx of high cholesterol. Questionable hx of TIA? He does follow with neuro. Not interested in going back on statin.    ROS: Const: no fevers Abd: As noted in HPI  Past Medical History:  Diagnosis Date  . Anemia   . Arthritis   . Bipolar 1 disorder ([place])   . Chicken pox   . Chronic bronchitis ([place])   . Depression   . Hyperlipidemia   . Hypertension   . Rheumatic fever   . Vitamin B12 deficiency    Injections    Objective: BP 140/82 (BP Location: Left Arm, Patient Position: Sitting, Cuff Size: Normal)   Pulse (!) 56   Temp (!) 97.1 F (36.2 C) (Temporal)   Ht 5' 7.5" (1.715 m)   Wt 171 lb (77.6 kg)   SpO2 95%   BMI 26.39 kg/m  General: Awake, appears stated age HEENT: MMM, EOMi Heart: RRR GI: BS+, S, Nt, ND Lungs: CTAB, no rales, wheezes or rhonchi. No accessory muscle use Psych: Age appropriate judgment and insight, normal affect and mood  Assessment and Plan: Statin myopathy  Hyperlipidemia, unspecified hyperlipidemia type - Plan: Lipid Profile  Ck cholesterol. I think we are in the secondary prevention stage and recommended going back on statin. Agreed to see labs and possibly add back low statin based on results. I will see him in 6 mo for CPE otherwise. The patient voiced understanding and agreement to the plan.  [place], DO 12/01/19  10:44 AM

## 2019-12-01 NOTE — Telephone Encounter (Signed)
Copied from [place] [id number]. Topic: General - Other >> Dec 01, 2019  2:41 PM Wynetta Emery, [place] C wrote: Reason for CRM: pt returned office call, pt says that it is in regards to his lab results.   Please advise.

## 2019-12-01 NOTE — Telephone Encounter (Signed)
See result notes. 

## 2019-12-20 ENCOUNTER — Telehealth: Payer: Self-pay

## 2019-12-20 ENCOUNTER — Other Ambulatory Visit: Payer: Self-pay | Admitting: Psychiatry

## 2019-12-20 DIAGNOSIS — Z79899 Other long term (current) drug therapy: Secondary | ICD-10-CM

## 2019-12-20 DIAGNOSIS — F3132 Bipolar disorder, current episode depressed, moderate: Secondary | ICD-10-CM

## 2019-12-20 NOTE — Telephone Encounter (Signed)
Please send Lithium level order to [facility] in [facility]. PCP only does in house labs and cannot do this for him.

## 2019-12-20 NOTE — Telephone Encounter (Signed)
[facility] sent to [facility]

## 2019-12-25 LAB — BASIC METABOLIC PANEL
BUN/Creatinine Ratio: 16 (ref 10–24)
BUN: 21 mg/dL (ref 8–27)
CO2: 25 mmol/L (ref 20–29)
Calcium: 9.3 mg/dL (ref 8.6–10.2)
Chloride: 104 mmol/L (ref 96–106)
Creatinine, Ser: 1.31 mg/dL — ABNORMAL HIGH (ref 0.76–1.27)
GFR calc Af Amer: 64 mL/min/{1.73_m2} (ref >59)
GFR calc non Af Amer: 56 mL/min/{1.73_m2} — ABNORMAL LOW (ref >59)
Glucose: 93 mg/dL (ref 65–99)
Potassium: 5 mmol/L (ref 3.5–5.2)
Sodium: 141 mmol/L (ref 134–144)

## 2019-12-25 LAB — LITHIUM LEVEL: Lithium Lvl: 0.8 mmol/L (ref 0.6–1.2)

## 2019-12-25 LAB — TSH: TSH: 1.4 u[IU]/mL (ref 0.450–4.500)

## 2020-03-06 ENCOUNTER — Other Ambulatory Visit: Payer: Self-pay

## 2020-03-07 ENCOUNTER — Ambulatory Visit (INDEPENDENT_AMBULATORY_CARE_PROVIDER_SITE_OTHER): Payer: Medicare HMO | Admitting: Family Medicine

## 2020-03-07 ENCOUNTER — Other Ambulatory Visit: Payer: Self-pay

## 2020-03-07 ENCOUNTER — Encounter: Payer: Self-pay | Admitting: Family Medicine

## 2020-03-07 VITALS — BP 128/72 | HR 64 | Temp 97.2°F | Ht 67.0 in | Wt 167.1 lb

## 2020-03-07 DIAGNOSIS — G720 Drug-induced myopathy: Secondary | ICD-10-CM | POA: Diagnosis not present

## 2020-03-07 DIAGNOSIS — T466X5A Adverse effect of antihyperlipidemic and antiarteriosclerotic drugs, initial encounter: Secondary | ICD-10-CM

## 2020-03-07 DIAGNOSIS — M25511 Pain in right shoulder: Secondary | ICD-10-CM

## 2020-03-07 MED ORDER — PRAVASTATIN SODIUM 10 MG PO TABS: ORAL_TABLET | ORAL | 3 refills | Status: DC

## 2020-03-07 NOTE — Patient Instructions (Signed)
Ice/cold pack over area for 10-15 min twice daily.  Heat (pad or rice pillow in microwave) over affected area, 10-15 minutes twice daily.   OK to take Tylenol 1000 mg (2 extra strength tabs) or 975 mg (3 regular strength tabs) every 6 hours as needed.  EXERCISES  RANGE OF MOTION (ROM) AND STRETCHING EXERCISES These exercises may help you when beginning to rehabilitate your injury. While completing these exercises, remember:   Restoring tissue flexibility helps normal motion to return to the joints. This allows healthier, less painful movement and activity.  An effective stretch should be held for at least 30 seconds.  A stretch should never be painful. You should only feel a gentle lengthening or release in the stretched tissue.  ROM - Pendulum  Bend at the waist so that your right / left arm falls away from your body. Support yourself with your opposite hand on a solid surface, such as a table or a countertop.  Your right / left arm should be perpendicular to the ground. If it is not perpendicular, you need to lean over farther. Relax the muscles in your right / left arm and shoulder as much as possible.  Gently sway your hips and trunk so they move your right / left arm without any use of your right / left shoulder muscles.  Progress your movements so that your right / left arm moves side to side, then forward and backward, and finally, both clockwise and counterclockwise.  Complete 10-15 repetitions in each direction. Many people use this exercise to relieve discomfort in their shoulder as well as to gain range of motion. Repeat 2 times. Complete this exercise 3 times per week.  STRETCH - Flexion, Standing  Stand with good posture. With an underhand grip on your right / left hand and an overhand grip on the opposite hand, grasp a broomstick or cane so that your hands are a little more than shoulder-width apart.  Keeping your right / left elbow straight and shoulder muscles  relaxed, push the stick with your opposite hand to raise your right / left arm in front of your body and then overhead. Raise your arm until you feel a stretch in your right / left shoulder, but before you have increased shoulder pain.  Try to avoid shrugging your right / left shoulder as your arm rises by keeping your shoulder blade tucked down and toward your mid-back spine. Hold 30 seconds.  Slowly return to the starting position. Repeat 2 times. Complete this exercise 3 times per week.  STRETCH - Internal Rotation  Place your right / left hand behind your back, palm-up.  Throw a towel or belt over your opposite shoulder. Grasp the towel/belt with your right / left hand.  While keeping an upright posture, gently pull up on the towel/belt until you feel a stretch in the front of your right / left shoulder.  Avoid shrugging your right / left shoulder as your arm rises by keeping your shoulder blade tucked down and toward your mid-back spine.  Hold 30. Release the stretch by lowering your opposite hand. Repeat 2 times. Complete this exercise 3 times per week.  STRETCH - External Rotation and Abduction  Stagger your stance through a doorframe. It does not matter which foot is forward.  As instructed by your physician, physical therapist or athletic trainer, place your hands: ? And forearms above your head and on the door frame. ? And forearms at head-height and on the door frame. ? At   elbow-height and on the door frame.  Keeping your head and chest upright and your stomach muscles tight to prevent over-extending your low-back, slowly shift your weight onto your front foot until you feel a stretch across your chest and/or in the front of your shoulders.  Hold 30 seconds. Shift your weight to your back foot to release the stretch. Repeat 2 times. Complete this stretch 3 times per week.   STRENGTHENING EXERCISES  These exercises may help you when beginning to rehabilitate your injury.  They may resolve your symptoms with or without further involvement from your physician, physical therapist or athletic trainer. While completing these exercises, remember:   Muscles can gain both the endurance and the strength needed for everyday activities through controlled exercises.  Complete these exercises as instructed by your physician, physical therapist or athletic trainer. Progress the resistance and repetitions only as guided.  You may experience muscle soreness or fatigue, but the pain or discomfort you are trying to eliminate should never worsen during these exercises. If this pain does worsen, stop and make certain you are following the directions exactly. If the pain is still present after adjustments, discontinue the exercise until you can discuss the trouble with your clinician.  If advised by your physician, during your recovery, avoid activity or exercises which involve actions that place your right / left hand or elbow above your head or behind your back or head. These positions stress the tissues which are trying to heal.  STRENGTH - Scapular Depression and Adduction  With good posture, sit on a firm chair. Supported your arms in front of you with pillows, arm rests or a table top. Have your elbows in line with the sides of your body.  Gently draw your shoulder blades down and toward your mid-back spine. Gradually increase the tension without tensing the muscles along the top of your shoulders and the back of your neck.  Hold for 3 seconds. Slowly release the tension and relax your muscles completely before completing the next repetition.  After you have practiced this exercise, remove the arm support and complete it in standing as well as sitting. Repeat 2 times. Complete this exercise 3 times per week.   STRENGTH - External Rotators  Secure a rubber exercise band/tubing to a fixed object so that it is at the same height as your right / left elbow when you are standing  or sitting on a firm surface.  Stand or sit so that the secured exercise band/tubing is at your side that is not injured.  Bend your elbow 90 degrees. Place a folded towel or small pillow under your right / left arm so that your elbow is a few inches away from your side.  Keeping the tension on the exercise band/tubing, pull it away from your body, as if pivoting on your elbow. Be sure to keep your body steady so that the movement is only coming from your shoulder rotating.  Hold 3 seconds. Release the tension in a controlled manner as you return to the starting position. Repeat 2 times. Complete this exercise 3 times per week.   STRENGTH - Supraspinatus  Stand or sit with good posture. Grasp a 2-3 lb weight or an exercise band/tubing so that your hand is "thumbs-up," like when you shake hands.  Slowly lift your right / left hand from your thigh into the air, traveling about 30 degrees from straight out at your side. Lift your hand to shoulder height or as far as you   can without increasing any shoulder pain. Initially, many people do not lift their hands above shoulder height.  Avoid shrugging your right / left shoulder as your arm rises by keeping your shoulder blade tucked down and toward your mid-back spine.  Hold for 3 seconds. Control the descent of your hand as you slowly return to your starting position. Repeat 2 times. Complete this exercise 3 times per week.   STRENGTH - Shoulder Extensors  Secure a rubber exercise band/tubing so that it is at the height of your shoulders when you are either standing or sitting on a firm arm-less chair.  With a thumbs-up grip, grasp an end of the band/tubing in each hand. Straighten your elbows and lift your hands straight in front of you at shoulder height. Step back away from the secured end of band/tubing until it becomes tense.  Squeezing your shoulder blades together, pull your hands down to the sides of your thighs. Do not allow your hands  to go behind you.  Hold for 3 seconds. Slowly ease the tension on the band/tubing as you reverse the directions and return to the starting position. Repeat 2 times. Complete this exercise 3 times per week.   STRENGTH - Scapular Retractors  Secure a rubber exercise band/tubing so that it is at the height of your shoulders when you are either standing or sitting on a firm arm-less chair.  With a palm-down grip, grasp an end of the band/tubing in each hand. Straighten your elbows and lift your hands straight in front of you at shoulder height. Step back away from the secured end of band/tubing until it becomes tense.  Squeezing your shoulder blades together, draw your elbows back as you bend them. Keep your upper arm lifted away from your body throughout the exercise.  Hold 3 seconds. Slowly ease the tension on the band/tubing as you reverse the directions and return to the starting position. Repeat 2 times. Complete this exercise 3 times per week.  STRENGTH - Scapular Depressors  Find a sturdy chair without wheels, such as a from a dining room table.  Keeping your feet on the floor, lift your bottom from the seat and lock your elbows.  Keeping your elbows straight, allow gravity to pull your body weight down. Your shoulders will rise toward your ears.  Raise your body against gravity by drawing your shoulder blades down your back, shortening the distance between your shoulders and ears. Although your feet should always maintain contact with the floor, your feet should progressively support less body weight as you get stronger.  Hold 3 seconds. In a controlled and slow manner, lower your body weight to begin the next repetition. Repeat 2 times. Complete this exercise 3 times per week.   This information is not intended to replace advice given to you by your health care provider. Make sure you discuss any questions you have with your health care provider.  Document Released: 10/09/2005  Document Revised: 12/16/2014 Document Reviewed: 03/09/2009 Elsevier Interactive Patient Education 2016 [employer] 

## 2020-03-07 NOTE — Progress Notes (Signed)
Musculoskeletal Exam  Patient: [name] DOB: 03/20/1951  DOS: 03/07/2020  SUBJECTIVE:  Chief Complaint:   Chief Complaint  Patient presents with  . Shoulder Pain    right    Noah Grant is a 69 y.o.  male for evaluation and treatment of R shoulder pain.   Onset:  1 month ago. Started using a walking stick.  Location: Feels the entire shoulder Character:  aching and dull  Progression of issue:  has worsened Associated symptoms: ROM has gotten worse; no redness, bruising, swelling Treatment: to date has been ice, acetaminophen and tiger balm, heat.   Neurovascular symptoms: no  Patient has a history of statin induced myopathy. He was changed from lovastatin to Crestor. He did not tolerate this well after 2 weeks. He has stopped the Crestor. He does not recall being on any other statin medication.  Past Medical History:  Diagnosis Date  . Anemia   . Arthritis   . Bipolar 1 disorder (HCC)   . Chicken pox   . Chronic bronchitis (HCC)   . Depression   . Hyperlipidemia   . Hypertension   . Rheumatic fever   . Vitamin B12 deficiency    Injections    Objective: VITAL SIGNS: BP 128/72 (BP Location: Left Arm, Patient Position: Sitting, Cuff Size: Normal)   Pulse 64   Temp (!) 97.2 F (36.2 C) (Temporal)   Ht 5\' 7"  (1.702 m)   Wt 167 lb 2 oz (75.8 kg)   SpO2 98%   BMI 26.18 kg/m  Constitutional: Well formed, well developed. No acute distress. Cardiovascular: Brisk cap refill Thorax & Lungs: No accessory muscle use Musculoskeletal: R shoulder.   Normal active range of motion: No; decreased abduction and forward flexion.   Normal passive range of motion: no; decreased abduction and forward flexion Tenderness to palpation: no Deformity: no Ecchymosis: no Tests positive: Empty can, Neer's Tests negative: Crossover, speeds, liftoff, Hawkins Neurologic: Normal sensory function. No focal deficits noted. DTR's equal and symmetric in UE's. No clonus. Psychiatric:  Normal mood. Age appropriate judgment and insight. Alert & oriented x 3.    Assessment:  Acute pain of right shoulder  Statin myopathy - Plan: pravastatin (PRAVACHOL) 10 MG tablet  Plan: 1- Suspect combination of frozen shoulder and rotator cuff tendinopathy. Stretches and exercises provided. He will return in 1 month if no improvement. We will inject and consider physical therapy. 2- stop Crestor. Start pravastatin weekly. Will steadily increase frequency if he tolerates this. The patient voiced understanding and agreement to the plan.   [place], DO 03/07/20  9:15 AM

## 2020-03-08 DIAGNOSIS — F3132 Bipolar disorder, current episode depressed, moderate: Secondary | ICD-10-CM | POA: Insufficient documentation

## 2020-03-08 DIAGNOSIS — R419 Unspecified symptoms and signs involving cognitive functions and awareness: Secondary | ICD-10-CM

## 2020-03-08 DIAGNOSIS — I6782 Cerebral ischemia: Secondary | ICD-10-CM | POA: Insufficient documentation

## 2020-03-08 HISTORY — DX: Bipolar disorder, current episode depressed, moderate: F31.32

## 2020-03-08 HISTORY — DX: Unspecified symptoms and signs involving cognitive functions and awareness: R41.9

## 2020-03-08 HISTORY — DX: Cerebral ischemia: I67.82

## 2020-03-12 DIAGNOSIS — L600 Ingrowing nail: Secondary | ICD-10-CM

## 2020-03-12 HISTORY — DX: Ingrowing nail: L60.0

## 2020-04-07 ENCOUNTER — Encounter: Payer: Self-pay | Admitting: Family Medicine

## 2020-04-07 ENCOUNTER — Other Ambulatory Visit: Payer: Self-pay

## 2020-04-07 ENCOUNTER — Ambulatory Visit (INDEPENDENT_AMBULATORY_CARE_PROVIDER_SITE_OTHER): Payer: Medicare HMO | Admitting: Family Medicine

## 2020-04-07 VITALS — BP 120/70 | HR 56 | Temp 96.1°F | Ht 67.0 in | Wt 158.0 lb

## 2020-04-07 DIAGNOSIS — M25511 Pain in right shoulder: Secondary | ICD-10-CM

## 2020-04-07 DIAGNOSIS — G720 Drug-induced myopathy: Secondary | ICD-10-CM

## 2020-04-07 DIAGNOSIS — M25512 Pain in left shoulder: Secondary | ICD-10-CM

## 2020-04-07 DIAGNOSIS — T466X5A Adverse effect of antihyperlipidemic and antiarteriosclerotic drugs, initial encounter: Secondary | ICD-10-CM

## 2020-04-07 DIAGNOSIS — G8929 Other chronic pain: Secondary | ICD-10-CM | POA: Diagnosis not present

## 2020-04-07 MED ORDER — PRAVASTATIN SODIUM 10 MG PO TABS: ORAL_TABLET | ORAL | 3 refills | Status: DC

## 2020-04-07 NOTE — Progress Notes (Signed)
Chief Complaint  Patient presents with  . Follow-up    chest pain  . Shoulder Pain    both    Subjective: Patient is a 69 y.o. male here for f/u.  Changed from Crestor daily to pravastatin daily.  Notices no pain at this, ready to increase.  The patient was compliant with the stretches and exercises for around 2 weeks then stop doing them due to lack of improvement.  He is not interested in physical therapy.  Still having pain but now on the left side as well due to sleeping arrangements.  He has an appointment with the rheumatologist in early June and would like to hold off on any injections until then.  His range of motion is poor.  Extra strength Tylenol is helpful.  Past Medical History:  Diagnosis Date  . Anemia   . Arthritis   . Bipolar 1 disorder (HCC)   . Chicken pox   . Chronic bronchitis (HCC)   . Depression   . Hyperlipidemia   . Hypertension   . Rheumatic fever   . Vitamin B12 deficiency    Injections    Objective: BP 120/70 (BP Location: Right Arm, Patient Position: Sitting, Cuff Size: Normal)   Pulse (!) 56   Temp (!) 96.1 F (35.6 C) (Temporal)   Ht 5\' 7"  (1.702 m)   Wt 158 lb (71.7 kg)   SpO2 93%   BMI 24.75 kg/m  General: Awake, appears stated age HEENT: MMM, EOMi Heart: RRR, no murmurs Lungs: CTAB, no rales, wheezes or rhonchi. No accessory muscle use MSK: +decreased active/passive ROM; +Neer's, Hawkins on R; neg Speed's, cross over, lift off Psych: Age appropriate judgment and insight, normal affect and mood  Assessment and Plan: Statin myopathy - Plan: pravastatin (PRAVACHOL) 10 MG tablet  Chronic pain of both shoulders  1- Increase to 2x/week. Call in 1 mo to let know, may increase to 3x/week. Stay hydrated. 2- Uncontrolled; Offered PT and inj, he declined both at this time. Cont stretches/exercises. F/u as originally scheduled.  The patient voiced understanding and agreement to the plan.  [place], DO 04/07/20  11:18  AM

## 2020-04-07 NOTE — Patient Instructions (Addendum)
Take the pravastatin 2 times per week now. Call the office in 1 month and let us know how you are doing with this.  Let me know if you change your mind about physical therapy or the injection(s).  Stay as hydrated as your bladder will allow.  Let us know if you need anything.

## 2020-05-17 ENCOUNTER — Ambulatory Visit (INDEPENDENT_AMBULATORY_CARE_PROVIDER_SITE_OTHER): Payer: Medicare HMO | Admitting: Psychiatry

## 2020-05-17 ENCOUNTER — Other Ambulatory Visit: Payer: Self-pay

## 2020-05-17 ENCOUNTER — Encounter: Payer: Self-pay | Admitting: Psychiatry

## 2020-05-17 DIAGNOSIS — F411 Generalized anxiety disorder: Secondary | ICD-10-CM | POA: Diagnosis not present

## 2020-05-17 DIAGNOSIS — F3132 Bipolar disorder, current episode depressed, moderate: Secondary | ICD-10-CM

## 2020-05-17 DIAGNOSIS — Z79899 Other long term (current) drug therapy: Secondary | ICD-10-CM

## 2020-05-17 MED ORDER — LITHIUM CARBONATE 150 MG PO CAPS: 450.0000 mg | ORAL_CAPSULE | Freq: Every evening | ORAL | 1 refills | Status: DC

## 2020-05-17 NOTE — Progress Notes (Signed)
[name] [id number] 18-Feb-1951 69 y.o.  Subjective:   Patient ID:  Noah Grant is a 69 y.o. (DOB 1951-03-02) male.  Chief Complaint:  Chief Complaint  Patient presents with  . Follow-up    depression and anxiety    Depression        Associated symptoms include no decreased concentration and no suicidal ideas.  Past medical history includes anxiety.   Anxiety Symptoms include nervous/anxious behavior. Patient reports no confusion, decreased concentration or suicidal ideas.    Medication Refill Associated symptoms include arthralgias and weakness.   [name] presents to the office today for follow-up of bipolar, GAD, poor STM.  seen May 18, 2019 and 11/17/2019 without med changes.    05/17/2020 appointment with the following noted: Word recall problems with neuropsych testing by Dr. Orie Fisherman inconclusive results.  Test reviewed. Still tremor can interfere with eating. Nocturia Dt overactive bladder. Overall mentally ok but too many medical concerns with RA and muscle pain.   Chronic pain issues ongoing.   Htn controlled.  Depression is somewhat chronic and waxes and wanes dependent on health issues.  Avoids stressors if possible.  Satisfied with psych meds. He's tried a lot of different meds as noted and doesn't want more changes.  Patient reports stable mood and denie irritable moods except as noted.  Patient denies any recent difficulty with anxiety except situational.  Patient denies unusual difficulty with sleep initiation or maintenance except awakens with pain chronically. Denies appetite disturbance.  Patient reports that energy and motivation have been good.  Patient denies any difficulty with concentration  But history of word finding from ministrokes..  Patient denies any suicidal ideation.  Has seen nephorologists and rheumatologists and neurologist, [facility].   Lives in [place] now  Previous psych med trials are extensive and include  meds for anxiety and mood.  These include clonidine, buspirone, Abilify 5 mg, temazepam, olanzapine, bupropion, Strattera, Depakote, Prozac, Ritalin, Serzone 700 mg daily, Provigil, sertraline, Geodon, lamotrigine, mirtazapine, Pamelor, amitriptyline 450 mg/day, duloxetine, Pristiq which caused rage, Cerefolin NAC, Ambien, ProSom, trazodone which was ineffective.  Review of Systems:  Review of Systems  Musculoskeletal: Positive for arthralgias and gait problem.  Neurological: Positive for tremors and weakness.       Occ jerks  Psychiatric/Behavioral: Positive for depression and dysphoric mood. Negative for agitation, behavioral problems, confusion, decreased concentration, hallucinations, self-injury, sleep disturbance and suicidal ideas. The patient is nervous/anxious. The patient is not hyperactive.     Medications: I have reviewed the patient's current medications.  Current Outpatient Medications  Medication Sig Dispense Refill  . amLODipine (NORVASC) 10 MG tablet TAKE ONE (1) TABLET BY MOUTH EVERY DAY 90 tablet 1  . aspirin EC 81 MG tablet Take 81 mg by mouth daily.    . carbamazepine (TEGRETOL) 200 MG tablet Take 1 tablet (200 mg total) by mouth 3 (three) times daily. 270 tablet 1  . Cholecalciferol 50 MCG (2000 UT) CAPS Take by mouth.    . hydrALAZINE (APRESOLINE) 50 MG tablet Take 50 mg by mouth 3 (three) times daily.    [name] labetalol (NORMODYNE) 100 MG tablet Take 100 mg by mouth 2 (two) times daily.    [name] leflunomide (ARAVA) 20 MG tablet Take 20 mg by mouth daily.    [name] lithium carbonate 150 MG capsule Take 3 capsules (450 mg total) by mouth at bedtime. 270 capsule 1  . losartan (COZAAR) 100 MG tablet Take 100 mg by mouth daily.    [name]  Lysine HCl 1000 MG TABS Take by mouth 2 (two) times daily.     [name] omeprazole (PRILOSEC) 40 MG capsule Take 1 capsule (40 mg total) by mouth every morning. 90 capsule 1  . pravastatin (PRAVACHOL) 10 MG tablet Take 1 tab twice weekly. 30 tablet 3   No  current facility-administered medications for this visit.    Medication Side Effects: None  Allergies:  Allergies  Allergen Reactions  . Hydroxychloroquine Other (See Comments)    Terrible Nightmares  . Methotrexate Dermatitis    Developed Mouth Sores  . Nsaids Dermatitis    Developed Mouth Blisters    Past Medical History:  Diagnosis Date  . Anemia   . Arthritis   . Bipolar 1 disorder (HCC)   . Chicken pox   . Chronic bronchitis (HCC)   . Depression   . Hyperlipidemia   . Hypertension   . Rheumatic fever   . Vitamin B12 deficiency    Injections    Family History  Problem Relation Age of Onset  . Stroke Father 57       Deceased  . Hypertension Father   . Alcoholism Father   . Heart disease Maternal Grandfather 35       Deceased  . Alzheimer's disease Mother 28       Deceased in early 38s  . Heart disease Paternal Grandfather 48       Deceased  . Alcoholism Brother   . Hypertension Son     Social History   Socioeconomic History  . Marital status: Married    Spouse name: Not on file  . Number of children: Not on file  . Years of education: Not on file  . Highest education level: Not on file  Occupational History  . Not on file  Tobacco Use  . Smoking status: Former Smoker    Types: Cigarettes  . Smokeless tobacco: Never Used  . Tobacco comment: Quit>40 years  Substance and Sexual Activity  . Alcohol use: Not on file  . Drug use: Not on file  . Sexual activity: Not on file  Other Topics Concern  . Not on file  Social History Narrative  . Not on file   Social Determinants of Health   Financial Resource Strain:   . Difficulty of Paying Living Expenses:   Food Insecurity:   . Worried About Programme researcher, broadcasting/film/video in the Last Year:   . Barista in the Last Year:   Transportation Needs:   . Freight forwarder (Medical):   [name] Lack of Transportation (Non-Medical):   Physical Activity:   . Days of Exercise per Week:   . Minutes of Exercise  per Session:   Stress:   . Feeling of Stress :   Social Connections:   . Frequency of Communication with Friends and Family:   . Frequency of Social Gatherings with Friends and Family:   . Attends Religious Services:   . Active Member of Clubs or Organizations:   . Attends Banker Meetings:   [name] Marital Status:   Intimate Partner Violence:   . Fear of Current or Ex-Partner:   . Emotionally Abused:   [name] Physically Abused:   . Sexually Abused:     Past Medical History, Surgical history, Social history, and Family history were reviewed and updated as appropriate.   Please see review of systems for further details on the patient's review from today.   Objective:   Physical Exam:  There were  no vitals taken for this visit.  Physical Exam Constitutional:      General: He is not in acute distress.    Appearance: He is well-developed.  Musculoskeletal:        General: No deformity.  Neurological:     Mental Status: He is alert and oriented to person, place, and time.     Coordination: Coordination normal.  Psychiatric:        Attention and Perception: Attention normal. He is attentive.        Mood and Affect: Mood normal. Mood is not anxious or depressed. Affect is not labile, blunt, angry or inappropriate.        Speech: Speech normal.        Behavior: Behavior normal.        Thought Content: Thought content normal. Thought content does not include homicidal or suicidal ideation. Thought content does not include homicidal or suicidal plan.        Cognition and Memory: Cognition normal.        Judgment: Judgment normal.     Comments: Insight is good. Good humor.     Lab Review:     Component Value Date/Time   NA 141 12/23/2019 0951   K 5.0 12/23/2019 0951   CL 104 12/23/2019 0951   CO2 25 12/23/2019 0951   GLUCOSE 93 12/23/2019 0951   GLUCOSE 99 01/19/2016 0946   BUN 21 12/23/2019 0951   CREATININE 1.31 (H) 12/23/2019 0951   CALCIUM 9.3 12/23/2019 0951    PROT 6.6 01/19/2016 0946   ALBUMIN 4.2 01/19/2016 0946   AST 12 01/19/2016 0946   ALT 15 01/19/2016 0946   ALKPHOS 67 01/19/2016 0946   BILITOT 0.4 01/19/2016 0946   GFRNONAA 56 (L) 12/23/2019 0951   GFRAA 64 12/23/2019 0951       Component Value Date/Time   WBC 4.4 08/16/2014 1013   RBC 4.18 (L) 08/16/2014 1013   HGB 13.1 08/16/2014 1013   HCT 38.8 (L) 08/16/2014 1013   PLT 225.0 08/16/2014 1013   MCV 92.6 08/16/2014 1013   MCHC 33.7 08/16/2014 1013   RDW 12.8 08/16/2014 1013    Lithium Lvl  Date Value Ref Range Status  12/23/2019 0.8 0.6 - 1.2 mmol/L Final    Comment:                                     Detection Limit = 0.1                           <0.1 indicates None Detected      No results found for: PHENYTOIN, PHENOBARB, VALPROATE, CBMZ   .res Assessment: Plan:    Bipolar disorder with moderate depression ([facility]) - Plan: lithium carbonate 150 MG capsule  Generalized anxiety disorder  Lithium use   He has failed multiple other psychiatric medications and does not wish to have further med changes today.  Lithium level from January 2021 0.8 on 600 mg daily. Gradually it's crept up.  Disc in detail it's relation to renal function and Cr has increased gradually. BC tremor & jerks reduce to 450 mg daily.  Counseled patient regarding potential benefits, risks, and side effects of lithium to include potential risk of lithium affecting thyroid and renal function.  Discussed need for periodic lab monitoring to determine drug level and to assess for potential adverse effects.  Counseled patient regarding signs and symptoms of lithium toxicity and advised that they notify office immediately or seek urgent medical attention if experiencing these signs and symptoms.  Patient advised to contact office with any questions or concerns.  Disc DDI with [facility] and to inform his doctors about this.  No other changes indicated.  He's failed multiple others.  6 mos  Meredith Staggers,  MD, DFAPA   Please see After Visit Summary for patient specific instructions.  Future Appointments  Date Time Provider Department Center  05/31/2020 11:00 AM [name], Jilda Roche, DO [id number]    No orders of the defined types were placed in this encounter.     -------------------------------

## 2020-05-24 ENCOUNTER — Other Ambulatory Visit: Payer: Self-pay | Admitting: Psychiatry

## 2020-05-24 DIAGNOSIS — F3132 Bipolar disorder, current episode depressed, moderate: Secondary | ICD-10-CM

## 2020-05-25 ENCOUNTER — Other Ambulatory Visit: Payer: Self-pay | Admitting: Psychiatry

## 2020-05-25 DIAGNOSIS — F3132 Bipolar disorder, current episode depressed, moderate: Secondary | ICD-10-CM

## 2020-05-31 ENCOUNTER — Other Ambulatory Visit: Payer: Self-pay

## 2020-05-31 ENCOUNTER — Ambulatory Visit (INDEPENDENT_AMBULATORY_CARE_PROVIDER_SITE_OTHER): Payer: Medicare HMO | Admitting: Family Medicine

## 2020-05-31 ENCOUNTER — Encounter: Payer: Self-pay | Admitting: Family Medicine

## 2020-05-31 VITALS — BP 128/70 | HR 58 | Temp 96.3°F | Ht 67.0 in | Wt 163.2 lb

## 2020-05-31 DIAGNOSIS — Z136 Encounter for screening for cardiovascular disorders: Secondary | ICD-10-CM | POA: Diagnosis not present

## 2020-05-31 DIAGNOSIS — Z0000 Encounter for general adult medical examination without abnormal findings: Secondary | ICD-10-CM | POA: Diagnosis not present

## 2020-05-31 LAB — CBC
HCT: 35.1 % — ABNORMAL LOW (ref 39.0–52.0)
Hemoglobin: 11.7 g/dL — ABNORMAL LOW (ref 13.0–17.0)
MCHC: 33.5 g/dL (ref 30.0–36.0)
MCV: 93.4 fl (ref 78.0–100.0)
Platelets: 199 10*3/uL (ref 150.0–400.0)
RBC: 3.76 Mil/uL — ABNORMAL LOW (ref 4.22–5.81)
RDW: 12.9 % (ref 11.5–15.5)
WBC: 4 10*3/uL (ref 4.0–10.5)

## 2020-05-31 LAB — LIPID PANEL
Cholesterol: 156 mg/dL (ref 0–200)
HDL: 29.9 mg/dL — ABNORMAL LOW (ref >39.00)
LDL Cholesterol: 89 mg/dL (ref 0–99)
NonHDL: 126.35
Total CHOL/HDL Ratio: 5
Triglycerides: 188 mg/dL — ABNORMAL HIGH (ref 0.0–149.0)
VLDL: 37.6 mg/dL (ref 0.0–40.0)

## 2020-05-31 LAB — COMPREHENSIVE METABOLIC PANEL
ALT: 17 U/L (ref 0–53)
AST: 15 U/L (ref 0–37)
Albumin: 4.4 g/dL (ref 3.5–5.2)
Alkaline Phosphatase: 63 U/L (ref 39–117)
BUN: 22 mg/dL (ref 6–23)
CO2: 29 mEq/L (ref 19–32)
Calcium: 9.4 mg/dL (ref 8.4–10.5)
Chloride: 106 mEq/L (ref 96–112)
Creatinine, Ser: 1.23 mg/dL (ref 0.40–1.50)
GFR: 58.43 mL/min — ABNORMAL LOW (ref >60.00)
Glucose, Bld: 82 mg/dL (ref 70–99)
Potassium: 4.8 mEq/L (ref 3.5–5.1)
Sodium: 137 mEq/L (ref 135–145)
Total Bilirubin: 0.4 mg/dL (ref 0.2–1.2)
Total Protein: 6.1 g/dL (ref 6.0–8.3)

## 2020-05-31 MED ORDER — AMLODIPINE BESYLATE 10 MG PO TABS: ORAL_TABLET | ORAL | 2 refills | Status: DC

## 2020-05-31 NOTE — Patient Instructions (Addendum)
Give [place] 2-3 business days to get the results of your labs back.   Keep the diet clean and stay active.  Check your blood pressure at home.   Ice/cold pack over area for 10-15 min twice daily.  Heat (pad or rice pillow in microwave) over affected area, 10-15 minutes twice daily.   Stop the pravastatin for 2 weeks.   Let us know if you need anything.  Gluteus Medius Syndrome Rehab It is normal to feel mild stretching, pulling, tightness, or discomfort as you do these exercises, but you should stop right away if you feel sudden pain or your pain gets worse.   Stretching and range of motion exercise This exercise warms up your muscles and joints and improves the movement and flexibility of your hip and pelvis. This exercise also helps to relieve pain and stiffness. Exercise A: Lunge (hip flexor stretch)     1. Kneel on the floor on your left / right knee. Bend your other knee so it is directly over your ankle. 2. Keep good posture with your head over your shoulders. Tuck your tailbone underneath you. This will prevent your back from arching too much. 3. You should feel a gentle stretch in the front of your thigh or hip. If you do not feel a stretch, slowly lunge forward with your chest up. 4. Hold this position for 30 seconds. 5. Slowly return to the starting position. Repeat 2 times. Complete this exercise 3 times per week. Strengthening exercises These exercises build strength and endurance in your hip and pelvis. Endurance is the ability to use your muscles for a long time, even after they get tired. Exercise B: Bridge (hip extensors)    1. Lie on your back on a firm surface with your knees bent and your feet flat on the floor. 2. Tighten your buttocks muscles and lift your bottom off the floor until the trunk of your body is level with your thighs. ? You should feel the muscles working in your buttocks and the back of your thighs. If this exercise is too easy, cross your arms  over your chest or lift one leg while your bottom is up off the floor. ? Do not arch your back. 3. Hold this position for 3 seconds. 4. Slowly lower your hips to the starting position. 5. Let your muscles relax completely between repetitions. Repeat 2 times. Complete this exercise 3 times per week. Exercise C: Straight leg raises (hip abductors)    1. Lie on your side with your left / right leg in the top position. Lie so your head, shoulder, knee, and hip line up. Bend your bottom knee to help you balance. 2. Lift your top leg up 4-6 inches (10-15 cm), keeping your toes pointed straight ahead. 3. Hold this position for 2 seconds. 4. Slowly lower your leg to the starting position and let your muscles relax completely. Repeat for a total of 10 repetitions. Repeat 2 times. Complete this exercise 3 times per week. Exercise D: Hip abductors and external rotators, quadruped 1. Get on your hands and knees on a firm, lightly padded surface. Your hands should be directly below your shoulders, and your knees should be directly below your hips. 2. Lift your left / right knee out to the side. Keep your knee bent. Do not twist your body. 3. Hold this position for 3 seconds. 4. Slowly lower your leg. Repeat for a total of 10 repetitions.  Repeat 2 times. Complete this exercise 3 times  per week. Exercise E: Single leg stand 1. Stand near a counter or door frame to hold onto as needed. It is helpful to look in a mirror for this exercise so you can watch your hip. 2. Squeeze your left / right buttock muscles then lift up your other foot. Do not let your left / righthip push out to the side. 3. Hold this position for 3 seconds. Repeat for a total of 10 repetitions. Repeat 2 times. Complete this exercise 3 times per week. Make sure you discuss any questions you have with your health care provider. Document Released: 11/25/2005 Document Revised: 08/01/2016 Document Reviewed: 11/07/2015 Elsevier Interactive  Patient Education  [employer].

## 2020-05-31 NOTE — Progress Notes (Signed)
Chief Complaint  Patient presents with   Follow-up    Well Male Noah Grant is here for a complete physical.   His last physical was >1 year ago.  Current diet: in general, a "healthy" diet.   Current exercise: tries walking Weight trend: stable Fatigue? No. Seat belt? Yes.    Health maintenance Shingrix- Yes  Colonoscopy- Yes Tetanus- No Hep C- Yes Pneumonia vaccine- Yes- 09/16/18 AAA screening- No  Past Medical History:  Diagnosis Date   Anemia    Arthritis    Bipolar 1 disorder (HCC)    Chicken pox    Chronic bronchitis (HCC)    Depression    Hyperlipidemia    Hypertension    Rheumatic fever    Vitamin B12 deficiency    Injections     Past Surgical History:  Procedure Laterality Date   CLAVICLE SURGERY     Right, Hardware placed   WISDOM TOOTH EXTRACTION      Medications  Current Outpatient Medications on File Prior to Visit  Medication Sig Dispense Refill   amLODipine (NORVASC) 10 MG tablet TAKE ONE (1) TABLET BY MOUTH EVERY DAY 90 tablet 1   aspirin EC 81 MG tablet Take 81 mg by mouth daily.     carbamazepine (TEGRETOL) 200 MG tablet TAKE ONE TABLET BY MOUTH THREE TIMES A DAY 270 tablet 0   Cholecalciferol 50 MCG (2000 UT) CAPS Take by mouth.     hydrALAZINE (APRESOLINE) 50 MG tablet Take 50 mg by mouth 3 (three) times daily.     labetalol (NORMODYNE) 100 MG tablet Take 100 mg by mouth 2 (two) times daily.     leflunomide (ARAVA) 20 MG tablet Take 20 mg by mouth daily.     lithium carbonate 150 MG capsule Take 3 capsules (450 mg total) by mouth at bedtime. 270 capsule 1   losartan (COZAAR) 100 MG tablet Take 100 mg by mouth daily.     Lysine HCl 1000 MG TABS Take by mouth 2 (two) times daily.      omeprazole (PRILOSEC) 40 MG capsule Take 1 capsule (40 mg total) by mouth every morning. 90 capsule 1   pravastatin (PRAVACHOL) 10 MG tablet Take 1 tab twice weekly. 30 tablet 3    Allergies Allergies  Allergen Reactions    Hydroxychloroquine Other (See Comments)    Terrible Nightmares   Methotrexate Dermatitis    Developed Mouth Sores   Nsaids Dermatitis    Developed Mouth Blisters    Family History Family History  Problem Relation Age of Onset   Stroke Father 64       Deceased   Hypertension Father    Alcoholism Father    Heart disease Maternal Grandfather 76       Deceased   Alzheimer's disease Mother 35       Deceased in early 29s   Heart disease Paternal Grandfather 28       Deceased   Alcoholism Brother    Hypertension Son     Review of Systems: Constitutional:  no fevers Eye:  no recent significant change in vision Ears:  No changes in hearing Nose/Mouth/Throat:  no complaints of nasal congestion, no sore throat Cardiovascular: no chest pain Respiratory:  No shortness of breath Gastrointestinal:  No change in bowel habits GU:  No frequency Integumentary:  no abnormal skin lesions reported Neurologic:  no headaches MSK: +L chest wall pain, +b/l hip pain Endocrine:  denies unexplained weight changes  Exam BP 128/70 (BP Location:  Left Arm, Patient Position: Sitting, Cuff Size: Normal)    Pulse (!) 58    Temp (!) 96.3 F (35.7 C) (Temporal)    Ht 5\' 7"  (1.702 m)    Wt 163 lb 4 oz (74 kg)    SpO2 99%    BMI 25.57 kg/m  General:  well developed, well nourished, in no apparent distress Skin:  no significant moles, warts, or growths Head:  no masses, lesions, or tenderness Eyes:  pupils equal and round, sclera anicteric without injection Ears:  canals without lesions, TMs shiny without retraction, no obvious effusion, no erythema Nose:  nares patent, septum midline, mucosa normal Throat/Pharynx:  lips and gingiva without lesion; tongue and uvula midline; non-inflamed pharynx; no exudates or postnasal drainage Lungs:  clear to auscultation, breath sounds equal bilaterally, no respiratory distress Cardio:  regular rate and rhythm, no LE edema or bruits Rectal: Deferred GI:  BS+, S, NT, ND, no masses or organomegaly Musculoskeletal: No ttp over chest wall or greater troch bursa b/l; symmetrical muscle groups noted without atrophy or deformity Neuro:  gait normal; deep tendon reflexes normal and symmetric Psych: well oriented with normal range of affect and appropriate judgment/insight  Assessment and Plan  Well adult exam - Plan: CBC, Comprehensive metabolic panel, Lipid panel  Screening for AAA (abdominal aortic aneurysm) - Plan: US AORTA MEDICARE SCREENING   Well 69 y.o. male. Counseled on diet and exercise. Follows with urology.  Other orders as above. Stop Labetalol. See what BP is, has f/u w nephro next month. Stop pravastatin as I suspect some of his aches/pains are related to that. Might have to go back to weekly dosing. Stretches/exercises given for glute med rehab. PT if no better.  Follow up in 2 mo to reck BP.  The patient voiced understanding and agreement to the plan.  [place], DO 05/31/20 11:15 AM

## 2020-06-01 ENCOUNTER — Telehealth: Payer: Self-pay | Admitting: Family Medicine

## 2020-06-01 ENCOUNTER — Other Ambulatory Visit (INDEPENDENT_AMBULATORY_CARE_PROVIDER_SITE_OTHER): Payer: Medicare HMO

## 2020-06-01 DIAGNOSIS — D72818 Other decreased white blood cell count: Secondary | ICD-10-CM

## 2020-06-01 DIAGNOSIS — D729 Disorder of white blood cells, unspecified: Secondary | ICD-10-CM

## 2020-06-01 LAB — IBC + FERRITIN
Ferritin: 42.5 ng/mL (ref 22.0–322.0)
Iron: 179 ug/dL — ABNORMAL HIGH (ref 42–165)
Saturation Ratios: 55.3 % — ABNORMAL HIGH (ref 20.0–50.0)
Transferrin: 231 mg/dL (ref 212.0–360.0)

## 2020-06-01 NOTE — Telephone Encounter (Signed)
LMOM informing Pt of results.

## 2020-06-01 NOTE — Telephone Encounter (Signed)
CallerDorrien Grunder Call Back # [phone]  Patient is requesting a call back in regards to lab results.

## 2020-06-05 ENCOUNTER — Other Ambulatory Visit: Payer: Self-pay | Admitting: Family Medicine

## 2020-06-05 DIAGNOSIS — R790 Abnormal level of blood mineral: Secondary | ICD-10-CM

## 2020-06-05 DIAGNOSIS — E611 Iron deficiency: Secondary | ICD-10-CM

## 2020-06-14 ENCOUNTER — Ambulatory Visit (HOSPITAL_BASED_OUTPATIENT_CLINIC_OR_DEPARTMENT_OTHER)
Admission: RE | Admit: 2020-06-14 | Discharge: 2020-06-14 | Disposition: A | Discharge status: Home / Self Care | Payer: Medicare HMO | Source: Ambulatory Visit | Attending: Family Medicine | Admitting: Family Medicine

## 2020-06-14 ENCOUNTER — Other Ambulatory Visit: Payer: Self-pay

## 2020-06-14 DIAGNOSIS — I700 Atherosclerosis of aorta: Secondary | ICD-10-CM | POA: Diagnosis not present

## 2020-06-14 DIAGNOSIS — Z136 Encounter for screening for cardiovascular disorders: Secondary | ICD-10-CM

## 2020-06-14 DIAGNOSIS — I77811 Abdominal aortic ectasia: Secondary | ICD-10-CM | POA: Insufficient documentation

## 2020-06-15 ENCOUNTER — Encounter: Payer: Self-pay | Admitting: Family Medicine

## 2020-06-15 DIAGNOSIS — I700 Atherosclerosis of aorta: Secondary | ICD-10-CM | POA: Insufficient documentation

## 2020-06-19 ENCOUNTER — Encounter: Payer: Self-pay | Admitting: Family Medicine

## 2020-06-19 ENCOUNTER — Other Ambulatory Visit (INDEPENDENT_AMBULATORY_CARE_PROVIDER_SITE_OTHER): Payer: Medicare HMO

## 2020-06-19 DIAGNOSIS — E611 Iron deficiency: Secondary | ICD-10-CM

## 2020-06-19 DIAGNOSIS — R790 Abnormal level of blood mineral: Secondary | ICD-10-CM | POA: Diagnosis not present

## 2020-06-19 LAB — CBC
HCT: 34.4 % — ABNORMAL LOW (ref 39.0–52.0)
Hemoglobin: 11.6 g/dL — ABNORMAL LOW (ref 13.0–17.0)
MCHC: 33.8 g/dL (ref 30.0–36.0)
MCV: 93.2 fl (ref 78.0–100.0)
Platelets: 208 10*3/uL (ref 150.0–400.0)
RBC: 3.69 Mil/uL — ABNORMAL LOW (ref 4.22–5.81)
RDW: 12.8 % (ref 11.5–15.5)
WBC: 4.7 10*3/uL (ref 4.0–10.5)

## 2020-06-19 LAB — IBC + FERRITIN
Ferritin: 45.2 ng/mL (ref 22.0–322.0)
Iron: 89 ug/dL (ref 42–165)
Saturation Ratios: 27.6 % (ref 20.0–50.0)
Transferrin: 230 mg/dL (ref 212.0–360.0)

## 2020-07-31 ENCOUNTER — Encounter: Payer: Self-pay | Admitting: Family Medicine

## 2020-07-31 ENCOUNTER — Ambulatory Visit (INDEPENDENT_AMBULATORY_CARE_PROVIDER_SITE_OTHER): Payer: Medicare HMO | Admitting: Family Medicine

## 2020-07-31 ENCOUNTER — Other Ambulatory Visit: Payer: Self-pay

## 2020-07-31 VITALS — BP 132/72 | HR 65 | Temp 98.3°F | Ht 67.5 in | Wt 162.5 lb

## 2020-07-31 DIAGNOSIS — Z789 Other specified health status: Secondary | ICD-10-CM

## 2020-07-31 DIAGNOSIS — R0789 Other chest pain: Secondary | ICD-10-CM

## 2020-07-31 DIAGNOSIS — I10 Essential (primary) hypertension: Secondary | ICD-10-CM | POA: Diagnosis not present

## 2020-07-31 DIAGNOSIS — R2681 Unsteadiness on feet: Secondary | ICD-10-CM

## 2020-07-31 HISTORY — DX: Other specified health status: Z78.9

## 2020-07-31 MED ORDER — LABETALOL HCL 100 MG PO TABS: 100.0000 mg | ORAL_TABLET | Freq: Two times a day (BID) | ORAL | 2 refills | Status: DC

## 2020-07-31 NOTE — Progress Notes (Signed)
Chief Complaint  Patient presents with  . Follow-up    Subjective Noah Grant is a 69 y.o. male who presents for hypertension follow up. He does monitor home blood pressures. Blood pressures ranging from 120's/70's on average. He is compliant with medications- Norvasc 10 mg/d, losartan 100 mg/d; recently stopped labetalol and went back on because it got high again. Patient has these side effects of medication: none He is adhering to a healthy diet overall. Current exercise: walking  Duration of issue: 1 month Quality: sharp Palliation: none Provocation: none Severity: 6/10 Radiation: Radiates toward center of chest Duration of chest pain: 3 hours Associated symptoms: gets some sob; no arm pain, jaw pain Cardiac history: AAA, statin intolerance Family heart history: dad had stroke, gpa's had MI's Smoker? Former, quit in 1972   Past Medical History:  Diagnosis Date  . Abdominal aortic atherosclerosis (HCC)   . Anemia    likely of chronic disease  . Arthritis   . Bipolar 1 disorder (HCC)   . Chicken pox   . Chronic bronchitis (HCC)   . Depression   . Hyperlipidemia   . Hypertension   . Rheumatic fever   . Vitamin B12 deficiency    Injections    Exam BP 132/72 (BP Location: Left Arm, Patient Position: Sitting, Cuff Size: Normal)   Pulse 65   Temp 98.3 F (36.8 C) (Oral)   Ht 5' 7.5" (1.715 m)   Wt 162 lb 8 oz (73.7 kg)   SpO2 98%   BMI 25.08 kg/m  General:  well developed, well nourished, in no apparent distress Heart: RRR, no bruits, no LE edema Lungs: clear to auscultation, no accessory muscle use Psych: well oriented with normal range of affect and appropriate judgment/insight  Essential hypertension  Atypical chest pain - Plan: EKG 12-Lead, Ambulatory referral to Cardiology  Statin intolerance  Unsteady gait - Plan: CBC, Comprehensive metabolic panel, CANCELED: CBC, CANCELED: Comprehensive metabolic panel  1. Cont meds. OK to stop home monitoring.  Counseled on diet and exercise.  2. Refer cardiology. EKG shows NSR, nml axis, RBBB, no other interval abn, no ST seg changes or T wave changes, good R wave progression. He is statin intolerant, has known atherosclerosis.  F/u in 5 mo. The patient voiced understanding and agreement to the plan.  Jilda Roche [place], DO 07/31/20  11:57 AM

## 2020-07-31 NOTE — Patient Instructions (Addendum)
If you do not hear anything about your referral in the next 1-2 weeks, call our office and ask for an update.  Because your blood pressure is well-controlled, you no longer have to check your blood pressure at home anymore unless you wish. Some people check it twice daily every day and some people stop altogether. Either or anything in between is fine. Strong work!  I don't think we can go off of your blood pressure medicines at this time.  Give [place] 2-3 business days to get the results of your labs back.   Stay hydrated.   Let us know if you need anything.

## 2020-08-01 LAB — CBC
HCT: 33.6 % — ABNORMAL LOW (ref 38.5–50.0)
Hemoglobin: 11.1 g/dL — ABNORMAL LOW (ref 13.2–17.1)
MCH: 30.6 pg (ref 27.0–33.0)
MCHC: 33 g/dL (ref 32.0–36.0)
MCV: 92.6 fL (ref 80.0–100.0)
MPV: 11 fL (ref 7.5–12.5)
Platelets: 218 10*3/uL (ref 140–400)
RBC: 3.63 10*6/uL — ABNORMAL LOW (ref 4.20–5.80)
RDW: 12.1 % (ref 11.0–15.0)
WBC: 3.9 10*3/uL (ref 3.8–10.8)

## 2020-08-01 LAB — COMPREHENSIVE METABOLIC PANEL
AG Ratio: 2.2 (calc) (ref 1.0–2.5)
ALT: 13 U/L (ref 9–46)
AST: 13 U/L (ref 10–35)
Albumin: 4.1 g/dL (ref 3.6–5.1)
Alkaline phosphatase (APISO): 65 U/L (ref 35–144)
BUN/Creatinine Ratio: 17 (calc) (ref 6–22)
BUN: 23 mg/dL (ref 7–25)
CO2: 27 mmol/L (ref 20–32)
Calcium: 9 mg/dL (ref 8.6–10.3)
Chloride: 106 mmol/L (ref 98–110)
Creat: 1.34 mg/dL — ABNORMAL HIGH (ref 0.70–1.25)
Globulin: 1.9 g/dL (calc) (ref 1.9–3.7)
Glucose, Bld: 81 mg/dL (ref 65–99)
Potassium: 4.9 mmol/L (ref 3.5–5.3)
Sodium: 139 mmol/L (ref 135–146)
Total Bilirubin: 0.3 mg/dL (ref 0.2–1.2)
Total Protein: 6 g/dL — ABNORMAL LOW (ref 6.1–8.1)

## 2020-08-30 ENCOUNTER — Encounter: Payer: Self-pay | Admitting: Cardiology

## 2020-08-30 ENCOUNTER — Other Ambulatory Visit: Payer: Self-pay

## 2020-08-30 ENCOUNTER — Ambulatory Visit: Payer: Medicare HMO | Admitting: Cardiology

## 2020-08-30 ENCOUNTER — Other Ambulatory Visit (HOSPITAL_COMMUNITY)
Admission: RE | Admit: 2020-08-30 | Discharge: 2020-08-30 | Disposition: A | Discharge status: Home / Self Care | Payer: Medicare HMO | Source: Ambulatory Visit | Attending: Internal Medicine | Admitting: Internal Medicine

## 2020-08-30 DIAGNOSIS — Z01812 Encounter for preprocedural laboratory examination: Secondary | ICD-10-CM | POA: Diagnosis present

## 2020-08-30 DIAGNOSIS — R0989 Other specified symptoms and signs involving the circulatory and respiratory systems: Secondary | ICD-10-CM

## 2020-08-30 DIAGNOSIS — Z20822 Contact with and (suspected) exposure to covid-19: Secondary | ICD-10-CM | POA: Insufficient documentation

## 2020-08-30 DIAGNOSIS — Z8673 Personal history of transient ischemic attack (TIA), and cerebral infarction without residual deficits: Secondary | ICD-10-CM | POA: Diagnosis not present

## 2020-08-30 DIAGNOSIS — R011 Cardiac murmur, unspecified: Secondary | ICD-10-CM | POA: Insufficient documentation

## 2020-08-30 DIAGNOSIS — Z789 Other specified health status: Secondary | ICD-10-CM

## 2020-08-30 DIAGNOSIS — I209 Angina pectoris, unspecified: Secondary | ICD-10-CM

## 2020-08-30 DIAGNOSIS — I700 Atherosclerosis of aorta: Secondary | ICD-10-CM

## 2020-08-30 DIAGNOSIS — E782 Mixed hyperlipidemia: Secondary | ICD-10-CM | POA: Diagnosis not present

## 2020-08-30 HISTORY — DX: Angina pectoris, unspecified: I20.9

## 2020-08-30 HISTORY — DX: Cardiac murmur, unspecified: R01.1

## 2020-08-30 LAB — SARS CORONAVIRUS 2 (TAT 6-24 HRS): SARS Coronavirus 2: NEGATIVE

## 2020-08-30 MED ORDER — NITROGLYCERIN 0.4 MG SL SUBL: 0.4000 mg | SUBLINGUAL_TABLET | SUBLINGUAL | 6 refills | Status: DC | PRN

## 2020-08-30 NOTE — Patient Instructions (Signed)
Medication Instructions:  Your physician has recommended you make the following change in your medication:   Take Nitroglycerin as needed for chest pain. Take 81 mg coated aspirin daily.  *If you need a refill on your cardiac medications before your next appointment, please call your pharmacy*   Lab Work: Your physician recommends that you have labs done in the office today. Your test included  basic metabolic panel and complete blood count.  If you have labs (blood work) drawn today and your tests are completely normal, you will receive your results only by: [name] MyChart Message (if you have MyChart) OR . A paper copy in the mail If you have any lab test that is abnormal or we need to change your treatment, we will call you to review the results.   Testing/Procedures:    [facility] CARDIOVASCULAR DIVISION [facility] [place] [address] [place] [address] Dept: [phone] Loc: [phone]  Noah Grant  08/30/2020  You are scheduled for a Cardiac Catheterization on Friday, September 24 with Dr. Cristal De[place] End.  1. Please arrive at the [facility] (Main Entrance A) at [facility]: [address] [place], [place] [address] at 6:30 AM (This time is four hours before your procedure to ensure your preparation). Free valet parking service is available.   Special note: Every effort is made to have your procedure done on time. Please understand that emergencies sometimes delay scheduled procedures.  2. Diet: Do not eat solid foods after midnight.  The patient may have clear liquids until 5am upon the day of the procedure.  3. Labs: You had your labs done today. 4. Medication instructions in preparation for your procedure:   Contrast Allergy: No  Stop taking, Cozaar (Losartan) Friday, September 24,  On the morning of your procedure, take your Aspirin and any morning medicines NOT listed above.  You may use sips of  water.  5. Plan for one night stay--bring personal belongings. 6. Bring a current list of your medications and current insurance cards. 7. You MUST have a responsible person to drive you home. 8. Someone MUST be with you the first 24 hours after you arrive home or your discharge will be delayed. 9. Please wear clothes that are easy to get on and off and wear slip-on shoes.  Thank you for allowing [place] to care for you!   -- [facility]    Follow-Up: At [facility], you and your health needs are our priority.  As part of our continuing mission to provide you with exceptional heart care, we have created designated Provider Care Teams.  These Care Teams include your primary Cardiologist (physician) and Advanced Practice Providers (APPs -  Physician Assistants and Nurse Practitioners) who all work together to provide you with the care you need, when you need it.  We recommend signing up for the patient portal called "MyChart".  Sign up information is provided on this After Visit Summary.  MyChart is used to connect with patients for Virtual Visits (Telemedicine).  Patients are able to view lab/test results, encounter notes, upcoming appointments, etc.  Non-urgent messages can be sent to your provider as well.   To learn more about what you can do with MyChart, go to [email or web address].    Your next appointment:   1 month(s)  The format for your next appointment:   In Person  Provider:   Belva Crome, MD   Other Instructions NA

## 2020-08-30 NOTE — Progress Notes (Signed)
Cardiology Office Note:    Date:  08/30/2020   ID:  Noah Grant, DOB 11/13/1951, MRN [id number]  PCP:  [name], [name], DO  Cardiologist:  Macario Shear R Jillene Wehrenberg, MD   Referring MD: [name], [name]*    ASSESSMENT:    1. Abdominal aortic atherosclerosis (HCC)   2. Labile hypertension   3. Mixed hyperlipidemia   4. History of stroke   5. Statin intolerance   6. Angina pectoris (HCC)   7. Cardiac murmur    PLAN:    In order of problems listed above:  1. Angina pectoris: Patient has multiple risk factors for coronary artery disease.  His symptoms are concerning.  I am aware of the fact that he has renal insufficiency also.  In view of the symptoms following recommendations were made.In view of the patient's symptoms, I discussed with the patient options for evaluation. Invasive and noninvasive options were given to the patient. I discussed stress testing and coronary angiography and left heart catheterization at length. Benefits, pros and cons of each approach were discussed at length. Patient had multiple questions which were answered to the patient's satisfaction. Patient opted for invasive evaluation and we will set up for coronary angiography and left heart catheterization. Further recommendations will be made based on the findings with coronary angiography. In the interim if the patient has any significant symptoms in hospital to the nearest emergency room.  I also discussed with him CT coronary angiography with FFR but he prefers to conventional coronary angiography especially in view of renal insufficiency and in view of the fact that the usage can be controlled.  I will also try to get her echocardiogram before his coronary angiography so that it will help us be conservative on radiocontrast dye usage. 2. Sublingual nitroglycerin prescription was sent, its protocol and 911 protocol explained and the patient vocalized understanding questions were answered to the patient's  satisfaction 3. Essential hypertension: Blood pressure stable and he is followed by nephrologist for this. 4. Cardiac murmur: Echocardiogram will be done to assess this. 5. He knows to go to nearest emergency room for any concerning symptoms.  His lipids are overall unremarkable.  Further recommendations will be made based on the findings of the aforementioned test.  Patient had multiple questions which were answered to his satisfaction.   Medication Adjustments/Labs and Tests Ordered: Current medicines are reviewed at length with the patient today.  Concerns regarding medicines are outlined above.  Orders Placed This Encounter  Procedures  . EKG 12-Lead   No orders of the defined types were placed in this encounter.    History of Present Illness:    Noah Grant is a 69 y.o. male who is being seen today for the evaluation of angina pectoris at the request of [name], [name]*.  Patient is a pleasant 69-year-old male.  He has past medical history of essential hypertension and statin intolerance.  Patient mentions to me that he has atherosclerotic vascular disease.  Disease diagnosed atherosclerosis in the aorta on the CT scan.  Patient mentions to me that consistently is having chest tightness on exertion and he stops and feels better.  He has mild renal insufficiency also.  The symptoms have been happening over the past several months and never corrected his quality of life.  No radiation to the neck or to the arms.  At the time of my evaluation, the patient is alert awake oriented and in no distress.  Past Medical History:  Diagnosis Date  .   Abdominal aortic atherosclerosis (HCC)   . Anemia    likely of chronic disease  . Arthritis   . Bipolar 1 disorder (HCC)   . Chicken pox   . Chronic bronchitis (HCC)   . Depression   . Hyperlipidemia   . Hypertension   . Rheumatic fever   . Vitamin B12 deficiency    Injections    Past Surgical History:  Procedure Laterality Date    . CLAVICLE SURGERY     Right, Hardware placed  . WISDOM TOOTH EXTRACTION      Current Medications: Current Meds  Medication Sig  . amLODipine (NORVASC) 10 MG tablet TAKE ONE (1) TABLET BY MOUTH EVERY DAY  . aspirin EC 81 MG tablet Take 81 mg by mouth daily.  . carbamazepine (TEGRETOL) 200 MG tablet TAKE ONE TABLET BY MOUTH THREE TIMES A DAY  . Cholecalciferol 50 MCG (2000 UT) CAPS Take by mouth.  . hydrALAZINE (APRESOLINE) 50 MG tablet Take 50 mg by mouth 3 (three) times daily.  . labetalol (NORMODYNE) 100 MG tablet Take 1 tablet (100 mg total) by mouth 2 (two) times daily.  . leflunomide (ARAVA) 20 MG tablet Take 20 mg by mouth daily.  . lithium carbonate 150 MG capsule Take 3 capsules (450 mg total) by mouth at bedtime.  . losartan (COZAAR) 100 MG tablet Take 100 mg by mouth daily.  . Lysine HCl 1000 MG TABS Take by mouth 2 (two) times daily.   . omeprazole (PRILOSEC) 40 MG capsule Take 1 capsule (40 mg total) by mouth every morning.     Allergies:   Hydroxychloroquine, Methotrexate, Nsaids, Lovastatin, Pravastatin, and Rosuvastatin   Social History   Socioeconomic History  . Marital status: Married    Spouse name: Not on file  . Number of children: Not on file  . Years of education: Not on file  . Highest education level: Not on file  Occupational History  . Not on file  Tobacco Use  . Smoking status: Former Smoker    Types: Cigarettes  . Smokeless tobacco: Never Used  . Tobacco comment: Quit>40 years  Substance and Sexual Activity  . Alcohol use: Not Currently    Alcohol/week: 0.0 standard drinks  . Drug use: Never  . Sexual activity: Not on file  Other Topics Concern  . Not on file  Social History Narrative  . Not on file   Social Determinants of Health   Financial Resource Strain:   . Difficulty of Paying Living Expenses: Not on file  Food Insecurity:   . Worried About Running Out of Food in the Last Year: Not on file  . Ran Out of Food in the Last Year:  Not on file  Transportation Needs:   . Lack of Transportation (Medical): Not on file  . Lack of Transportation (Non-Medical): Not on file  Physical Activity:   . Days of Exercise per Week: Not on file  . Minutes of Exercise per Session: Not on file  Stress:   . Feeling of Stress : Not on file  Social Connections:   . Frequency of Communication with Friends and Family: Not on file  . Frequency of Social Gatherings with Friends and Family: Not on file  . Attends Religious Services: Not on file  . Active Member of Clubs or Organizations: Not on file  . Attends Club or Organization Meetings: Not on file  . Marital Status: Not on file     Family History: The patient's family history includes Alcoholism in   his brother and father; Alzheimer's disease (age of onset: 70) in his mother; Heart disease (age of onset: 53) in his paternal grandfather; Heart disease (age of onset: 60) in his maternal grandfather; Hypertension in his father and son; Stroke (age of onset: 62) in his father.  ROS:   Please see the history of present illness.    All other systems reviewed and are negative.  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 IMPRESSION: Ectatic and atherosclerotic abdominal aorta.  No aneurysm seen.   Electronically Signed   By: Arash  Radparvar M.D.   On: 06/14/2020 20:14   Recent Labs: 12/23/2019: TSH 1.400 07/31/2020: ALT 13; BUN 23; Creat 1.34; Hemoglobin 11.1; Platelets 218; Potassium 4.9; Sodium 139  Recent Lipid Panel    Component Value Date/Time   CHOL 156 05/31/2020 1112   TRIG 188.0 (H) 05/31/2020 1112   HDL 29.90 (L) 05/31/2020 1112   CHOLHDL 5 05/31/2020 1112   VLDL 37.6 05/31/2020 1112   LDLCALC 89 05/31/2020 1112   LDLDIRECT 99.0 12/01/2019 1056    Physical Exam:     VS:  BP (!) 156/70   Pulse (!) 55   Ht 5' 7.5" (1.715 m)   Wt 163 lb (73.9 kg)   SpO2 98%   BMI 25.15 kg/m     Wt Readings from Last 3 Encounters:  08/30/20 163 lb (73.9 kg)  07/31/20 162 lb 8 oz (73.7 kg)  05/31/20 163 lb 4 oz (74 kg)     GEN: Patient is in no acute distress HEENT: Normal NECK: No JVD; No carotid bruits LYMPHATICS: No lymphadenopathy CARDIAC: S1 S2 regular, 2/6 systolic murmur at the apex. RESPIRATORY:  Clear to auscultation without rales, wheezing or rhonchi  ABDOMEN: Soft, non-tender, non-distended MUSCULOSKELETAL:  No edema; No deformity  SKIN: Warm and dry NEUROLOGIC:  Alert and oriented x 3 PSYCHIATRIC:  Normal affect    Signed, Ameir Faria R Vicenta Olds, MD  08/30/2020 11:17 AM    [facility]   

## 2020-08-30 NOTE — H&P (View-Only) (Signed)
Cardiology Office Note:    Date:  08/30/2020   ID:  [name], DOB 12-20-1950, MRN [id number]  PCP:  Sharlene Dory, DO  Cardiologist:  Garwin Brothers, MD   Referring MD: Sharlene Dory*    ASSESSMENT:    1. Abdominal aortic atherosclerosis (HCC)   2. Labile hypertension   3. Mixed hyperlipidemia   4. History of stroke   5. Statin intolerance   6. Angina pectoris (HCC)   7. Cardiac murmur    PLAN:    In order of problems listed above:  1. Angina pectoris: Patient has multiple risk factors for coronary artery disease.  His symptoms are concerning.  I am aware of the fact that he has renal insufficiency also.  In view of the symptoms following recommendations were made.In view of the patient's symptoms, I discussed with the patient options for evaluation. Invasive and noninvasive options were given to the patient. I discussed stress testing and coronary angiography and left heart catheterization at length. Benefits, pros and cons of each approach were discussed at length. Patient had multiple questions which were answered to the patient's satisfaction. Patient opted for invasive evaluation and we will set up for coronary angiography and left heart catheterization. Further recommendations will be made based on the findings with coronary angiography. In the interim if the patient has any significant symptoms in hospital to the nearest emergency room.  I also discussed with him CT coronary angiography with FFR but he prefers to conventional coronary angiography especially in view of renal insufficiency and in view of the fact that the usage can be controlled.  I will also try to get her echocardiogram before his coronary angiography so that it will help [place] be conservative on radiocontrast dye usage. 2. Sublingual nitroglycerin prescription was sent, its protocol and 911 protocol explained and the patient vocalized understanding questions were answered to the patient's  satisfaction 3. Essential hypertension: Blood pressure stable and he is followed by nephrologist for this. 4. Cardiac murmur: Echocardiogram will be done to assess this. 5. He knows to go to nearest emergency room for any concerning symptoms.  His lipids are overall unremarkable.  Further recommendations will be made based on the findings of the aforementioned test.  Patient had multiple questions which were answered to his satisfaction.   Medication Adjustments/Labs and Tests Ordered: Current medicines are reviewed at length with the patient today.  Concerns regarding medicines are outlined above.  Orders Placed This Encounter  Procedures  . EKG 12-Lead   No orders of the defined types were placed in this encounter.    History of Present Illness:    Noah Grant is a 69 y.o. male who is being seen today for the evaluation of angina pectoris at the request of Sharlene Dory*.  Patient is a pleasant 69 year old male.  He has past medical history of essential hypertension and statin intolerance.  Patient mentions to me that he has atherosclerotic vascular disease.  Disease diagnosed atherosclerosis in the aorta on the CT scan.  Patient mentions to me that consistently is having chest tightness on exertion and he stops and feels better.  He has mild renal insufficiency also.  The symptoms have been happening over the past several months and never corrected his quality of life.  No radiation to the neck or to the arms.  At the time of my evaluation, the patient is alert awake oriented and in no distress.  Past Medical History:  Diagnosis Date  .  Abdominal aortic atherosclerosis (HCC)   . Anemia    likely of chronic disease  . Arthritis   . Bipolar 1 disorder (HCC)   . Chicken pox   . Chronic bronchitis (HCC)   . Depression   . Hyperlipidemia   . Hypertension   . Rheumatic fever   . Vitamin B12 deficiency    Injections    Past Surgical History:  Procedure Laterality Date    . CLAVICLE SURGERY     Right, Hardware placed  . WISDOM TOOTH EXTRACTION      Current Medications: Current Meds  Medication Sig  . amLODipine (NORVASC) 10 MG tablet TAKE ONE (1) TABLET BY MOUTH EVERY DAY  . aspirin EC 81 MG tablet Take 81 mg by mouth daily.  . carbamazepine (TEGRETOL) 200 MG tablet TAKE ONE TABLET BY MOUTH THREE TIMES A DAY  . Cholecalciferol 50 MCG (2000 UT) CAPS Take by mouth.  . hydrALAZINE (APRESOLINE) 50 MG tablet Take 50 mg by mouth 3 (three) times daily.  [name] labetalol (NORMODYNE) 100 MG tablet Take 1 tablet (100 mg total) by mouth 2 (two) times daily.  [name] leflunomide (ARAVA) 20 MG tablet Take 20 mg by mouth daily.  [name] lithium carbonate 150 MG capsule Take 3 capsules (450 mg total) by mouth at bedtime.  [name] losartan (COZAAR) 100 MG tablet Take 100 mg by mouth daily.  [name] Lysine HCl 1000 MG TABS Take by mouth 2 (two) times daily.   [name] omeprazole (PRILOSEC) 40 MG capsule Take 1 capsule (40 mg total) by mouth every morning.     Allergies:   Hydroxychloroquine, Methotrexate, Nsaids, Lovastatin, Pravastatin, and Rosuvastatin   Social History   Socioeconomic History  . Marital status: Married    Spouse name: Not on file  . Number of children: Not on file  . Years of education: Not on file  . Highest education level: Not on file  Occupational History  . Not on file  Tobacco Use  . Smoking status: Former Smoker    Types: Cigarettes  . Smokeless tobacco: Never Used  . Tobacco comment: Quit>40 years  Substance and Sexual Activity  . Alcohol use: Not Currently    Alcohol/week: 0.0 standard drinks  . Drug use: Never  . Sexual activity: Not on file  Other Topics Concern  . Not on file  Social History Narrative  . Not on file   Social Determinants of Health   Financial Resource Strain:   . Difficulty of Paying Living Expenses: Not on file  Food Insecurity:   . Worried About Programme researcher, broadcasting/film/video in the Last Year: Not on file  . Ran Out of Food in the Last Year:  Not on file  Transportation Needs:   . Lack of Transportation (Medical): Not on file  . Lack of Transportation (Non-Medical): Not on file  Physical Activity:   . Days of Exercise per Week: Not on file  . Minutes of Exercise per Session: Not on file  Stress:   . Feeling of Stress : Not on file  Social Connections:   . Frequency of Communication with Friends and Family: Not on file  . Frequency of Social Gatherings with Friends and Family: Not on file  . Attends Religious Services: Not on file  . Active Member of Clubs or Organizations: Not on file  . Attends Banker Meetings: Not on file  . Marital Status: Not on file     Family History: The patient's family history includes Alcoholism in  his brother and father; Alzheimer's disease (age of onset: 42) in his mother; Heart disease (age of onset: 52) in his paternal grandfather; Heart disease (age of onset: 65) in his maternal grandfather; Hypertension in his father and son; Stroke (age of onset: 11) in his father.  ROS:   Please see the history of present illness.    All other systems reviewed and are negative.  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 IMPRESSION: Ectatic and atherosclerotic abdominal aorta.  No aneurysm seen.   Electronically Signed   By: Elgie Collard M.D.   On: 06/14/2020 20:14   Recent Labs: 12/23/2019: TSH 1.400 07/31/2020: ALT 13; BUN 23; Creat 1.34; Hemoglobin 11.1; Platelets 218; Potassium 4.9; Sodium 139  Recent Lipid Panel    Component Value Date/Time   CHOL 156 05/31/2020 1112   TRIG 188.0 (H) 05/31/2020 1112   HDL 29.90 (L) 05/31/2020 1112   CHOLHDL 5 05/31/2020 1112   VLDL 37.6 05/31/2020 1112   LDLCALC 89 05/31/2020 1112   LDLDIRECT 99.0 12/01/2019 1056    Physical Exam:     VS:  BP (!) 156/70   Pulse (!) 55   Ht 5' 7.5" (1.715 m)   Wt 163 lb (73.9 kg)   SpO2 98%   BMI 25.15 kg/m     Wt Readings from Last 3 Encounters:  08/30/20 163 lb (73.9 kg)  07/31/20 162 lb 8 oz (73.7 kg)  05/31/20 163 lb 4 oz (74 kg)     GEN: Patient is in no acute distress HEENT: Normal NECK: No JVD; No carotid bruits LYMPHATICS: No lymphadenopathy CARDIAC: S1 S2 regular, 2/6 systolic murmur at the apex. RESPIRATORY:  Clear to auscultation without rales, wheezing or rhonchi  ABDOMEN: Soft, non-tender, non-distended MUSCULOSKELETAL:  No edema; No deformity  SKIN: Warm and dry NEUROLOGIC:  Alert and oriented x 3 PSYCHIATRIC:  Normal affect    Signed, Garwin Brothers, MD  08/30/2020 11:17 AM    [facility]

## 2020-08-31 ENCOUNTER — Telehealth: Payer: Self-pay | Admitting: *Deleted

## 2020-08-31 ENCOUNTER — Ambulatory Visit (HOSPITAL_COMMUNITY)
Admission: RE | Admit: 2020-08-31 | Discharge: 2020-08-31 | Disposition: A | Discharge status: Home / Self Care | Payer: Medicare HMO | Source: Ambulatory Visit | Attending: Cardiology | Admitting: Cardiology

## 2020-08-31 DIAGNOSIS — E785 Hyperlipidemia, unspecified: Secondary | ICD-10-CM | POA: Insufficient documentation

## 2020-08-31 DIAGNOSIS — I209 Angina pectoris, unspecified: Secondary | ICD-10-CM | POA: Diagnosis present

## 2020-08-31 DIAGNOSIS — I10 Essential (primary) hypertension: Secondary | ICD-10-CM | POA: Insufficient documentation

## 2020-08-31 DIAGNOSIS — R011 Cardiac murmur, unspecified: Secondary | ICD-10-CM | POA: Insufficient documentation

## 2020-08-31 DIAGNOSIS — I088 Other rheumatic multiple valve diseases: Secondary | ICD-10-CM | POA: Diagnosis not present

## 2020-08-31 LAB — CBC WITH DIFFERENTIAL/PLATELET
Basophils Absolute: 0.1 10*3/uL (ref 0.0–0.2)
Basos: 1 %
EOS (ABSOLUTE): 0.3 10*3/uL (ref 0.0–0.4)
Eos: 7 %
Hematocrit: 32.4 % — ABNORMAL LOW (ref 37.5–51.0)
Hemoglobin: 10.8 g/dL — ABNORMAL LOW (ref 13.0–17.7)
Immature Grans (Abs): 0 10*3/uL (ref 0.0–0.1)
Immature Granulocytes: 0 %
Lymphocytes Absolute: 0.5 10*3/uL — ABNORMAL LOW (ref 0.7–3.1)
Lymphs: 11 %
MCH: 30.5 pg (ref 26.6–33.0)
MCHC: 33.3 g/dL (ref 31.5–35.7)
MCV: 92 fL (ref 79–97)
Monocytes Absolute: 0.7 10*3/uL (ref 0.1–0.9)
Monocytes: 16 %
Neutrophils Absolute: 2.8 10*3/uL (ref 1.4–7.0)
Neutrophils: 65 %
Platelets: 212 10*3/uL (ref 150–450)
RBC: 3.54 x10E6/uL — ABNORMAL LOW (ref 4.14–5.80)
RDW: 12 % (ref 11.6–15.4)
WBC: 4.3 10*3/uL (ref 3.4–10.8)

## 2020-08-31 LAB — BASIC METABOLIC PANEL
BUN/Creatinine Ratio: 15 (ref 10–24)
BUN: 19 mg/dL (ref 8–27)
CO2: 24 mmol/L (ref 20–29)
Calcium: 9 mg/dL (ref 8.6–10.2)
Chloride: 104 mmol/L (ref 96–106)
Creatinine, Ser: 1.24 mg/dL (ref 0.76–1.27)
GFR calc Af Amer: 69 mL/min/{1.73_m2} (ref >59)
GFR calc non Af Amer: 59 mL/min/{1.73_m2} — ABNORMAL LOW (ref >59)
Glucose: 89 mg/dL (ref 65–99)
Potassium: 5.1 mmol/L (ref 3.5–5.2)
Sodium: 139 mmol/L (ref 134–144)

## 2020-08-31 LAB — ECHOCARDIOGRAM COMPLETE
Area-P 1/2: 2.93 cm2
Calc EF: 57.4 %
S' Lateral: 3 cm
Single Plane A2C EF: 52.3 %
Single Plane A4C EF: 56.8 %

## 2020-08-31 NOTE — Telephone Encounter (Signed)
Pt contacted pre-catheterization scheduled at [facility] for: Friday September 01, 2020 10:30 AM Verified arrival time and place: [facility] Main Entrance A [facility]) at: 6:30 AM-pre-procedure hydration per Dr Tomie China   No solid food after midnight prior to cath, clear liquids until 5 AM day of procedure.  Hold: Losartan-day before and day of procedure-GFR 59-pt already taken today  Except hold medications AM meds can be  taken pre-cath with sips of water including: ASA 81 mg   Confirmed patient has responsible adult to drive home post procedure and be with patient first 24 hours after arriving home: yes  You are allowed ONE visitor in the waiting room during the time you are at the hospital for your procedure. Both you and your visitor must wear a mask once you enter the hospital.       COVID-19 Pre-Screening Questions:   In the past 14 days have you had a new cough, new headache, new nasal congestion, fever (100.4 or greater) unexplained body aches, new sore throat, or sudden loss of taste or sense of smell? no  In the past 14 days have you been around anyone with known Covid 19? no  Have you been vaccinated for COVID-19? Yes, See immunization history   Reviewed procedure/mask/visitor instructions, COVID-19 questions with patient.

## 2020-08-31 NOTE — Progress Notes (Signed)
  Echocardiogram 2D Echocardiogram has been performed.  Augustine Radar 08/31/2020, 2:06 PM

## 2020-09-04 ENCOUNTER — Telehealth: Payer: Self-pay | Admitting: Cardiology

## 2020-09-04 ENCOUNTER — Encounter (HOSPITAL_COMMUNITY): Admission: RE | Payer: Self-pay | Source: Home / Self Care

## 2020-09-04 ENCOUNTER — Other Ambulatory Visit (HOSPITAL_COMMUNITY)
Admission: RE | Admit: 2020-09-04 | Discharge: 2020-09-04 | Disposition: A | Discharge status: Home / Self Care | Payer: Medicare HMO | Source: Ambulatory Visit | Attending: Cardiovascular Disease | Admitting: Cardiovascular Disease

## 2020-09-04 ENCOUNTER — Ambulatory Visit (HOSPITAL_COMMUNITY): Admission: RE | Admit: 2020-09-04 | Payer: Medicare HMO | Source: Home / Self Care | Admitting: Cardiology

## 2020-09-04 DIAGNOSIS — Z01812 Encounter for preprocedural laboratory examination: Secondary | ICD-10-CM | POA: Diagnosis present

## 2020-09-04 DIAGNOSIS — Z20822 Contact with and (suspected) exposure to covid-19: Secondary | ICD-10-CM | POA: Insufficient documentation

## 2020-09-04 LAB — SARS CORONAVIRUS 2 (TAT 6-24 HRS): SARS Coronavirus 2: NEGATIVE

## 2020-09-04 SURGERY — LEFT HEART CATH AND CORONARY ANGIOGRAPHY
Anesthesia: LOCAL

## 2020-09-04 NOTE — Telephone Encounter (Signed)
New message  Pt called in stated that he has been talking to [place] about a testing getting approved.  He stated that he rec'd a letter in the mail from his ins stating that they are not going to do anything until he has a stress test.  He stated he  has not heard anything from the urgency appeal.  Pt would like to know the status   [phone]

## 2020-09-04 NOTE — Telephone Encounter (Signed)
Pt is scheduled for 09/06/20 for his cath. Arrive at 9:30 for 11:30 cath with Dr. Excell Seltzer. Pt verbalized understanding and had no additional questions.

## 2020-09-05 ENCOUNTER — Telehealth: Payer: Self-pay | Admitting: *Deleted

## 2020-09-05 NOTE — Telephone Encounter (Signed)
Pt contacted pre-catheterization scheduled at [facility] for: Wednesday September 06, 2020 11:30 AM Verified arrival time and place: [facility] Main Entrance A [facility]) at: 9:30 AM   No solid food after midnight prior to cath, clear liquids until 5 AM day of procedure.  Hold: Losartan -day before and day of procedure-GFR 59-already taken today  Except hold medications AM meds can be  taken pre-cath with sips of water including: ASA 81 mg   Confirmed patient has responsible adult to drive home post procedure and be with patient first 24 hours after arriving home: yes  You are allowed ONE visitor in the waiting room during the time you are at the hospital for your procedure. Both you and your visitor must wear a mask once you enter the hospital.       COVID-19 Pre-Screening Questions:  . In the past 14 days have you had a new cough, new headache, new nasal congestion, fever (100.4 or greater) unexplained body aches, new sore throat, or sudden loss of taste or sense of smell? no . In the past 14 days have you been around anyone with known Covid 19? no . Have you been vaccinated for COVID-19? Yes, see immunization history    Reviewed procedure/mask/visitor instructions, COVID-19 questions with patient.

## 2020-09-06 ENCOUNTER — Observation Stay (HOSPITAL_COMMUNITY)
Admission: RE | Admit: 2020-09-06 | Discharge: 2020-09-07 | Disposition: A | Discharge status: Home / Self Care | Payer: Medicare HMO | Attending: Internal Medicine | Admitting: Internal Medicine

## 2020-09-06 ENCOUNTER — Ambulatory Visit (HOSPITAL_COMMUNITY)
Admission: RE | Disposition: A | Discharge status: Home / Self Care | Payer: Self-pay | Source: Home / Self Care | Attending: Internal Medicine

## 2020-09-06 DIAGNOSIS — I25119 Atherosclerotic heart disease of native coronary artery with unspecified angina pectoris: Secondary | ICD-10-CM | POA: Insufficient documentation

## 2020-09-06 DIAGNOSIS — I209 Angina pectoris, unspecified: Secondary | ICD-10-CM | POA: Diagnosis present

## 2020-09-06 DIAGNOSIS — Z7982 Long term (current) use of aspirin: Secondary | ICD-10-CM | POA: Diagnosis not present

## 2020-09-06 DIAGNOSIS — R079 Chest pain, unspecified: Secondary | ICD-10-CM

## 2020-09-06 DIAGNOSIS — Z789 Other specified health status: Secondary | ICD-10-CM

## 2020-09-06 DIAGNOSIS — I2510 Atherosclerotic heart disease of native coronary artery without angina pectoris: Secondary | ICD-10-CM | POA: Insufficient documentation

## 2020-09-06 DIAGNOSIS — R0989 Other specified symptoms and signs involving the circulatory and respiratory systems: Secondary | ICD-10-CM

## 2020-09-06 DIAGNOSIS — I10 Essential (primary) hypertension: Secondary | ICD-10-CM | POA: Diagnosis not present

## 2020-09-06 DIAGNOSIS — Z8673 Personal history of transient ischemic attack (TIA), and cerebral infarction without residual deficits: Secondary | ICD-10-CM

## 2020-09-06 DIAGNOSIS — Z79899 Other long term (current) drug therapy: Secondary | ICD-10-CM | POA: Insufficient documentation

## 2020-09-06 DIAGNOSIS — E782 Mixed hyperlipidemia: Secondary | ICD-10-CM

## 2020-09-06 DIAGNOSIS — Z95820 Peripheral vascular angioplasty status with implants and grafts: Secondary | ICD-10-CM

## 2020-09-06 DIAGNOSIS — Z87891 Personal history of nicotine dependence: Secondary | ICD-10-CM | POA: Insufficient documentation

## 2020-09-06 DIAGNOSIS — I700 Atherosclerosis of aorta: Principal | ICD-10-CM | POA: Insufficient documentation

## 2020-09-06 HISTORY — PX: CORONARY STENT INTERVENTION: CATH118234

## 2020-09-06 HISTORY — DX: Atherosclerotic heart disease of native coronary artery without angina pectoris: I25.10

## 2020-09-06 HISTORY — PX: LEFT HEART CATH AND CORONARY ANGIOGRAPHY: CATH118249

## 2020-09-06 HISTORY — DX: Atherosclerotic heart disease of native coronary artery with unspecified angina pectoris: I25.119

## 2020-09-06 HISTORY — DX: Chest pain, unspecified: R07.9

## 2020-09-06 LAB — TROPONIN I (HIGH SENSITIVITY): Troponin I (High Sensitivity): 131 ng/L (ref <18)

## 2020-09-06 LAB — BASIC METABOLIC PANEL
Anion gap: 11 (ref 5–15)
BUN: 18 mg/dL (ref 8–23)
CO2: 21 mmol/L — ABNORMAL LOW (ref 22–32)
Calcium: 8.9 mg/dL (ref 8.9–10.3)
Chloride: 108 mmol/L (ref 98–111)
Creatinine, Ser: 1.26 mg/dL — ABNORMAL HIGH (ref 0.61–1.24)
GFR calc Af Amer: >60 mL/min (ref >60)
GFR calc non Af Amer: 58 mL/min — ABNORMAL LOW (ref >60)
Glucose, Bld: 112 mg/dL — ABNORMAL HIGH (ref 70–99)
Potassium: 3.7 mmol/L (ref 3.5–5.1)
Sodium: 140 mmol/L (ref 135–145)

## 2020-09-06 LAB — POCT ACTIVATED CLOTTING TIME: Activated Clotting Time: 373 seconds

## 2020-09-06 LAB — GLUCOSE, CAPILLARY: Glucose-Capillary: 84 mg/dL (ref 70–99)

## 2020-09-06 SURGERY — LEFT HEART CATH AND CORONARY ANGIOGRAPHY
Anesthesia: LOCAL

## 2020-09-06 MED ORDER — FENTANYL CITRATE (PF) 100 MCG/2ML IJ SOLN
INTRAMUSCULAR | Status: DC | PRN
Start: 2020-09-06 — End: 2020-09-06
  Administered 2020-09-06 (×2): 25 ug via INTRAVENOUS
  Administered 2020-09-06: 50 ug via INTRAVENOUS

## 2020-09-06 MED ORDER — SODIUM CHLORIDE 0.9 % WEIGHT BASED INFUSION
3.0000 mL/kg/h | INTRAVENOUS | Status: AC
  Administered 2020-09-06: 3 mL/kg/h via INTRAVENOUS

## 2020-09-06 MED ORDER — NITROGLYCERIN 1 MG/10 ML FOR IR/CATH LAB
INTRA_ARTERIAL | Status: AC
  Filled 2020-09-06: qty 10

## 2020-09-06 MED ORDER — FENTANYL CITRATE (PF) 100 MCG/2ML IJ SOLN
INTRAMUSCULAR | Status: AC
  Filled 2020-09-06: qty 2

## 2020-09-06 MED ORDER — VERAPAMIL HCL 2.5 MG/ML IV SOLN
INTRAVENOUS | Status: AC
  Filled 2020-09-06: qty 2

## 2020-09-06 MED ORDER — CLOPIDOGREL BISULFATE 75 MG PO TABS
75.0000 mg | ORAL_TABLET | Freq: Every day | ORAL | Status: DC
  Administered 2020-09-07: 75 mg via ORAL
  Filled 2020-09-06: qty 1

## 2020-09-06 MED ORDER — HYDRALAZINE HCL 20 MG/ML IJ SOLN
10.0000 mg | INTRAMUSCULAR | Status: AC | PRN
  Administered 2020-09-06: 10 mg via INTRAVENOUS
  Filled 2020-09-06: qty 1

## 2020-09-06 MED ORDER — SODIUM CHLORIDE 0.9% FLUSH
3.0000 mL | Freq: Two times a day (BID) | INTRAVENOUS | Status: DC
  Administered 2020-09-07: 3 mL via INTRAVENOUS

## 2020-09-06 MED ORDER — ACETAMINOPHEN 325 MG PO TABS: 650.0000 mg | ORAL_TABLET | ORAL | Status: DC | PRN

## 2020-09-06 MED ORDER — ASPIRIN 81 MG PO CHEW: 81.0000 mg | CHEWABLE_TABLET | ORAL | Status: DC

## 2020-09-06 MED ORDER — PANTOPRAZOLE SODIUM 40 MG PO TBEC: 40.0000 mg | DELAYED_RELEASE_TABLET | Freq: Every day | ORAL | 1 refills | Status: DC

## 2020-09-06 MED ORDER — CLOPIDOGREL BISULFATE 300 MG PO TABS
ORAL_TABLET | ORAL | Status: AC
  Filled 2020-09-06: qty 2

## 2020-09-06 MED ORDER — VERAPAMIL HCL 2.5 MG/ML IV SOLN
INTRAVENOUS | Status: DC | PRN
  Administered 2020-09-06: 10 mL via INTRA_ARTERIAL

## 2020-09-06 MED ORDER — HYDRALAZINE HCL 20 MG/ML IJ SOLN
INTRAMUSCULAR | Status: AC
  Filled 2020-09-06: qty 1

## 2020-09-06 MED ORDER — BIVALIRUDIN BOLUS VIA INFUSION - CUPID
INTRAVENOUS | Status: DC | PRN
  Administered 2020-09-06: 55.425 mg via INTRAVENOUS

## 2020-09-06 MED ORDER — ASPIRIN 81 MG PO CHEW
81.0000 mg | CHEWABLE_TABLET | Freq: Every day | ORAL | Status: DC
  Administered 2020-09-07: 81 mg via ORAL
  Filled 2020-09-06: qty 1

## 2020-09-06 MED ORDER — LABETALOL HCL 5 MG/ML IV SOLN
10.0000 mg | INTRAVENOUS | Status: AC | PRN
  Administered 2020-09-06: 10 mg via INTRAVENOUS
  Filled 2020-09-06: qty 4

## 2020-09-06 MED ORDER — NITROGLYCERIN 0.4 MG SL SUBL
SUBLINGUAL_TABLET | SUBLINGUAL | Status: AC
  Filled 2020-09-06: qty 1

## 2020-09-06 MED ORDER — MIDAZOLAM HCL 2 MG/2ML IJ SOLN
INTRAMUSCULAR | Status: AC
  Filled 2020-09-06: qty 2

## 2020-09-06 MED ORDER — BIVALIRUDIN TRIFLUOROACETATE 250 MG IV SOLR
INTRAVENOUS | Status: AC
  Filled 2020-09-06: qty 250

## 2020-09-06 MED ORDER — NITROGLYCERIN 1 MG/10 ML FOR IR/CATH LAB
INTRA_ARTERIAL | Status: DC | PRN
  Administered 2020-09-06 (×3): 200 ug via INTRACORONARY

## 2020-09-06 MED ORDER — SODIUM CHLORIDE 0.9% FLUSH: 3.0000 mL | INTRAVENOUS | Status: DC | PRN

## 2020-09-06 MED ORDER — SODIUM CHLORIDE 0.9 % IV SOLN: 250.0000 mL | INTRAVENOUS | Status: DC | PRN

## 2020-09-06 MED ORDER — SODIUM CHLORIDE 0.9% FLUSH
3.0000 mL | Freq: Two times a day (BID) | INTRAVENOUS | Status: DC
  Administered 2020-09-07 (×2): 3 mL via INTRAVENOUS

## 2020-09-06 MED ORDER — SODIUM CHLORIDE 0.9 % WEIGHT BASED INFUSION: 1.0000 mL/kg/h | INTRAVENOUS | Status: DC

## 2020-09-06 MED ORDER — HEPARIN (PORCINE) IN NACL 1000-0.9 UT/500ML-% IV SOLN
INTRAVENOUS | Status: DC | PRN
  Administered 2020-09-06 (×2): 500 mL

## 2020-09-06 MED ORDER — LIDOCAINE HCL (PF) 1 % IJ SOLN
INTRAMUSCULAR | Status: DC | PRN
  Administered 2020-09-06: 15 mL
  Administered 2020-09-06: 2 mL

## 2020-09-06 MED ORDER — NITROGLYCERIN 0.4 MG SL SUBL
0.4000 mg | SUBLINGUAL_TABLET | SUBLINGUAL | Status: DC | PRN
  Administered 2020-09-06 (×2): 0.4 mg via SUBLINGUAL

## 2020-09-06 MED ORDER — ONDANSETRON HCL 4 MG/2ML IJ SOLN
4.0000 mg | Freq: Four times a day (QID) | INTRAMUSCULAR | Status: DC | PRN
  Administered 2020-09-06: 4 mg via INTRAVENOUS
  Filled 2020-09-06: qty 2

## 2020-09-06 MED ORDER — LIDOCAINE HCL (PF) 1 % IJ SOLN
INTRAMUSCULAR | Status: AC
  Filled 2020-09-06: qty 30

## 2020-09-06 MED ORDER — CLOPIDOGREL BISULFATE 75 MG PO TABS: 75.0000 mg | ORAL_TABLET | Freq: Every day | ORAL | 2 refills | Status: DC

## 2020-09-06 MED ORDER — SODIUM CHLORIDE 0.9 % WEIGHT BASED INFUSION
1.0000 mL/kg/h | INTRAVENOUS | Status: AC
  Administered 2020-09-07: 1 mL/kg/h via INTRAVENOUS

## 2020-09-06 MED ORDER — IOHEXOL 350 MG/ML SOLN
INTRAVENOUS | Status: DC | PRN
  Administered 2020-09-06: 140 mL

## 2020-09-06 MED ORDER — CLOPIDOGREL BISULFATE 300 MG PO TABS
ORAL_TABLET | ORAL | Status: DC | PRN
  Administered 2020-09-06: 600 mg via ORAL

## 2020-09-06 MED ORDER — HEPARIN (PORCINE) IN NACL 1000-0.9 UT/500ML-% IV SOLN
INTRAVENOUS | Status: AC
  Filled 2020-09-06: qty 1000

## 2020-09-06 MED ORDER — HYDRALAZINE HCL 20 MG/ML IJ SOLN
INTRAMUSCULAR | Status: DC | PRN
  Administered 2020-09-06 (×2): 10 mg via INTRAVENOUS

## 2020-09-06 MED ORDER — HEPARIN SODIUM (PORCINE) 1000 UNIT/ML IJ SOLN
INTRAMUSCULAR | Status: AC
  Filled 2020-09-06: qty 1

## 2020-09-06 MED ORDER — MIDAZOLAM HCL 2 MG/2ML IJ SOLN
INTRAMUSCULAR | Status: DC | PRN
  Administered 2020-09-06 (×2): 1 mg via INTRAVENOUS
  Administered 2020-09-06: 2 mg via INTRAVENOUS

## 2020-09-06 MED ORDER — SODIUM CHLORIDE 0.9 % IV SOLN
INTRAVENOUS | Status: DC | PRN
  Administered 2020-09-06 (×2): 1.75 mg/kg/h via INTRAVENOUS

## 2020-09-06 MED FILL — PANTOPRAZOLE SOD DR 40 MG T: 40 | 30 days supply | Qty: 30 | Fill #0

## 2020-09-06 MED FILL — CLOPIDOGREL 75 MG TABLET: 75 | 30 days supply | Qty: 30 | Fill #0

## 2020-09-06 SURGICAL SUPPLY — 24 items
BALLN SAPPHIRE 2.5X15 (BALLOONS) ×2
BALLN SAPPHIRE ~~LOC~~ 3.0X15 (BALLOONS) ×1 IMPLANT
BALLOON SAPPHIRE 2.5X15 (BALLOONS) IMPLANT
CATH 5FR JL3.5 JR4 ANG PIG MP (CATHETERS) ×1 IMPLANT
CATH VISTA GUIDE 6FR JR4 (CATHETERS) ×1 IMPLANT
CLOSURE PERCLOSE PROSTYLE (VASCULAR PRODUCTS) ×1 IMPLANT
DEVICE RAD COMP TR BAND LRG (VASCULAR PRODUCTS) ×1 IMPLANT
GLIDESHEATH SLEND SS 6F .021 (SHEATH) ×1 IMPLANT
GUIDEWIRE ANGLED .035X150CM (WIRE) ×1 IMPLANT
GUIDEWIRE INQWIRE 1.5J.035X260 (WIRE) IMPLANT
INQWIRE 1.5J .035X260CM (WIRE) ×2
KIT ENCORE 26 ADVANTAGE (KITS) ×1 IMPLANT
KIT HEART LEFT (KITS) ×2 IMPLANT
KIT HEMO VALVE WATCHDOG (MISCELLANEOUS) ×1 IMPLANT
PACK CARDIAC CATHETERIZATION (CUSTOM PROCEDURE TRAY) ×2 IMPLANT
SHEATH PINNACLE 5F 10CM (SHEATH) ×1 IMPLANT
SHEATH PINNACLE 6F 10CM (SHEATH) ×1 IMPLANT
STENT RESOLUTE ONYX 2.5X18 (Permanent Stent) ×1 IMPLANT
STENT RESOLUTE ONYX 3.0X8 (Permanent Stent) ×1 IMPLANT
TRANSDUCER W/STOPCOCK (MISCELLANEOUS) ×2 IMPLANT
TUBING CIL FLEX 10 FLL-RA (TUBING) ×2 IMPLANT
WIRE COUGAR XT STRL 190CM (WIRE) ×1 IMPLANT
WIRE EMERALD 3MM-J .035X150CM (WIRE) ×1 IMPLANT
WIRE HI TORQ VERSACORE-J 145CM (WIRE) ×1 IMPLANT

## 2020-09-06 NOTE — Progress Notes (Signed)
Transferred to 4-E-20 via bed with cardiac monitor and report given to RN for 4-E-20

## 2020-09-06 NOTE — Discharge Summary (Addendum)
Discharge Summary for Same Day PCI   Patient ID: Noah Grant MRN: [id number]; DOB: July 07, 1951  Admit date: 09/06/2020 Discharge date: 09/07/2020  Primary Care Provider: Sharlene Dory, DO  Primary Cardiologist: Garwin Brothers, MD  Primary Electrophysiologist:  None   Discharge Diagnoses    Active Problems:   Angina pectoris [facility])   Coronary artery disease involving native coronary artery of native heart with angina pectoris [facility])   Chest pain   CAD  HTN  HLD   Diagnostic Studies/Procedures    Cardiac Catheterization 09/07/2020:  1.  Severe single-vessel coronary artery disease involving the right PDA branch, treated successfully with overlapping drug-eluting stents, 0% residual stenosis 2.  Mild nonobstructive disease in the mid LAD 3.  Normal left main and left circumflex 4.  Known normal LV function by echo assessment with LVEF 60%  Recommendations: Same-day PCI protocol, aspirin and clopidogrel for 6 months without interruption.    History of Present Illness     Noah Grant is a 69 y.o. male with a PMH of HTN, HLD, atherosclerosis of the abdominal aorta, and CKD stage 2-3a. He was seen outpatient by Dr. Tomie China 08/30/20 with complaints of intermittent exertional chest pain for the past several months. Given symptoms and risk factors for CAD, decision was made to pursue a Cardiac catheterization for further evaluation.  Hospital Course     The patient underwent cardiac cath as noted above with PCI/DES to right PDA branch. Plan for DAPT with ASA/plavix for at least 6 months. The patient was seen by cardiac rehab while in short stay.Referral sent to the lipid clinic for PSK9-inhibitor consideration given prior intolerance to statin. BBlocker not started due to baseline bradycardia.  Pertinent changes include addition of plavix and transition from omeprazole to pantoprazole.  The patient had chest pain at time of same day post PCI discharge. Improved  after SL nitro. Fluctuating blood pressure. He was kept overnight without any further complications. EKG without acute changes. Elevated troponin. Renal function stable. Ambulated well this morning.   _____________  Cath/PCI Registry Performance & Quality Measures: 1. Aspirin prescribed? - Yes 2. ADP Receptor Inhibitor (Plavix/Clopidogrel, Brilinta/Ticagrelor or Effient/Prasugrel) prescribed (includes medically managed patients)? - Yes 3. High Intensity Statin (Lipitor 40-80mg  or Crestor 20-40mg ) prescribed? - No - intolerant 4. For EF <40%, was ACEI/ARB prescribed? - Yes 5. For EF <40%, Aldosterone Antagonist (Spironolactone or Eplerenone) prescribed? - Not Applicable (EF >/= 40%) 6. Cardiac Rehab Phase II ordered (Included Medically managed Patients)? - Yes    Discharge Vitals Blood pressure (!) 153/57, pulse (!) 56, temperature 98.5 F (36.9 C), temperature source Oral, resp. rate 15, height  (1.702 m), weight 75.2 kg, SpO2 99 %.  Filed Weights   09/06/20 0908 09/06/20 2308  Weight: 73.9 kg 75.2 kg   Physical Exam Constitutional:      Appearance: Normal appearance.  HENT:     Head: Normocephalic.     Nose: Nose normal.  Eyes:     Extraocular Movements: Extraocular movements intact.     Pupils: Pupils are equal, round, and reactive to light.  Cardiovascular:     Rate and Rhythm: Normal rate and regular rhythm.     Comments: Right radial and groin site without any hematoma Pulmonary:     Effort: Pulmonary effort is normal.     Breath sounds: Normal breath sounds.  Abdominal:     General: Abdomen is flat.     Palpations: Abdomen is soft.  Musculoskeletal:  General: Normal range of motion.     Cervical back: Normal range of motion and neck supple.  Skin:    General: Skin is warm and dry.  Neurological:     General: No focal deficit present.     Mental Status: He is alert and oriented to person, place, and time.  Psychiatric:        Mood and Affect: Mood  normal.        Behavior: Behavior normal.    Last Labs & Radiologic Studies    _____________  CARDIAC CATHETERIZATION  Result Date: 09/06/2020 1.  Severe single-vessel coronary artery disease involving the right PDA branch, treated successfully with overlapping drug-eluting stents, 0% residual stenosis 2.  Mild nonobstructive disease in the mid LAD 3.  Normal left main and left circumflex 4.  Known normal LV function by echo assessment with LVEF 60% Recommendations: Same-day PCI protocol, aspirin and clopidogrel for 6 months without interruption.  ECHOCARDIOGRAM COMPLETE  Result Date: 08/31/2020    ECHOCARDIOGRAM REPORT   Patient Name:   Noah Grant Date of Exam: 08/31/2020 Medical Rec #:  [id number]       Height:       67.5 in Accession #:    [id number]      Weight:       163.0 lb Date of Birth:  [birth year]      BSA:          1.864 m Patient Age:    68 years        BP:           170/70 mmHg Patient Gender: M               HR:           55 bpm. Exam Location:  Outpatient Procedure: 2D Echo, Cardiac Doppler and Color Doppler Indications:    Murmur 785.2 / R01.1  History:        Patient has no prior history of Echocardiogram examinations.                 Risk Factors:Hypertension and Dyslipidemia.  Sonographer:    Eulah Pont RDCS Referring Phys: Rito Ehrlich [facility] IMPRESSIONS  1. Elevated pressure half time seen in isolation. Spectral Doppler would be consistent with greater than mild pulmonic insufficiency. If clinincally indicated, would recommended repeat limited study.  2. Left ventricular ejection fraction, by estimation, is 55 to 60%. The left ventricle has normal function. The left ventricle has no regional wall motion abnormalities. Left ventricular diastolic parameters were normal.  3. Right ventricular systolic function is normal. The right ventricular size is normal. There is mildly elevated pulmonary artery systolic pressure.  4. The mitral valve is normal in structure. Mild mitral  valve regurgitation.  5. The aortic valve is normal in structure. Aortic valve regurgitation is not visualized.  6. The inferior vena cava is normal in size with <50% respiratory variability, suggesting right atrial pressure of 8 mmHg. FINDINGS  Left Ventricle: Left ventricular ejection fraction, by estimation, is 55 to 60%. The left ventricle has normal function. The left ventricle has no regional wall motion abnormalities. The left ventricular internal cavity size was normal in size. There is  no left ventricular hypertrophy. Left ventricular diastolic parameters were normal. Right Ventricle: The right ventricular size is normal. No increase in right ventricular wall thickness. Right ventricular systolic function is normal. There is mildly elevated pulmonary artery systolic pressure. The tricuspid regurgitant velocity is 2.71  m/s,  and with an assumed right atrial pressure of 8 mmHg, the estimated right ventricular systolic pressure is 37.4 mmHg. Left Atrium: Left atrial size was normal in size. Right Atrium: Right atrial size was normal in size. Pericardium: There is no evidence of pericardial effusion. Mitral Valve: The mitral valve is normal in structure. Mild mitral valve regurgitation. Tricuspid Valve: The tricuspid valve is normal in structure. Tricuspid valve regurgitation is trivial. Aortic Valve: The aortic valve is normal in structure. Aortic valve regurgitation is not visualized. Pulmonic Valve: Elevated pressure half time seen in isolation. Spectral Doppler would be consistent with greater than mild pulmonic insufficiency. If clinincally indicated, would recommended repeat limited study. The pulmonic valve was normal in structure. Pulmonic valve regurgitation is mild. Aorta: The aortic root and ascending aorta are structurally normal, with no evidence of dilitation. Venous: The inferior vena cava is normal in size with less than 50% respiratory variability, suggesting right atrial pressure of 8 mmHg.  IAS/Shunts: The interatrial septum was not assessed.  LEFT VENTRICLE PLAX 2D LVIDd:         4.10 cm     Diastology LVIDs:         3.00 cm     LV e' medial:    6.22 cm/s LV PW:         0.90 cm     LV E/e' medial:  17.2 LV IVS:        1.00 cm     LV e' lateral:   11.00 cm/s                            LV E/e' lateral: 9.7  LV Volumes (MOD) LV vol d, MOD A2C: 94.4 ml LV vol d, MOD A4C: 99.6 ml LV vol s, MOD A2C: 45.0 ml LV vol s, MOD A4C: 43.0 ml LV SV MOD A2C:     49.4 ml LV SV MOD A4C:     99.6 ml LV SV MOD BP:      59.3 ml RIGHT VENTRICLE RV S prime:     10.30 cm/s TAPSE (M-mode): 1.9 cm LEFT ATRIUM             Index       RIGHT ATRIUM           Index LA diam:        4.20 cm 2.25 cm/m  RA Area:     14.40 cm LA Vol (A2C):   49.0 ml 26.29 ml/m RA Volume:   31.80 ml  17.06 ml/m LA Vol (A4C):   47.0 ml 25.21 ml/m LA Biplane Vol: 48.4 ml 25.96 ml/m  AORTIC VALVE LVOT Vmax:   156.00 cm/s LVOT Vmean:  110.000 cm/s LVOT VTI:    0.381 m  AORTA Ao Root diam: 3.30 cm Ao Asc diam:  3.10 cm MITRAL VALVE                TRICUSPID VALVE MV Area (PHT): 2.93 cm     TR Peak grad:   29.4 mmHg MV Decel Time: 259 msec     TR Vmax:        271.00 cm/s MV E velocity: 107.00 cm/s MV A velocity: 72.20 cm/s   SHUNTS MV E/A ratio:  1.48         Systemic VTI: 0.38 m Riley Lam MD Electronically signed by Riley Lam MD Signature Date/Time: 08/31/2020/2:48:41 PM    Final     Disposition  Pt is being discharged home today in good condition.  Follow-up Plans & Appointments     Follow-up Information    Revankar, Aundra Dubin, MD Follow up on 09/25/2020.   Specialty: Cardiology Why: Please arrive 15 minutes early for your 10:20am post-cath follow-up appointment Contact information: [address] [place]  [place] [address] [phone]              Discharge Instructions    AMB Referral to Advanced Lipid Disorders Clinic   Complete by: As directed    Reason for referral: Patients with statin  intolerance (failed 2 statins, one of which must be a high potency statin)   Internal Lipid Clinic Referral Scheduling  Internal lipid clinic referrals are providers within [facility], who wish to refer established patients for routine management (help in starting PCSK9 inhibitor therapy) or advanced therapies.  Internal MD referral criteria:              1. All patients with LDL>190 mg/dL  2. All patients with Triglycerides >500 mg/dL  3. Patients with suspected or confirmed heterozygous familial hyperlipidemia (HeFH) or homozygous familial hyperlipidemia (HoFH)  4. Patients with family history of suspicious for genetic dyslipidemia desiring genetic testing  5. Patients refractory to standard guideline based therapy  6. Patients with statin intolerance (failed 2 statins, one of which must be a high potency statin)  7. Patients who the provider desires to be seen by MD   Internal PharmD referral criteria:   1. Follow-up patients for medication management  2. Follow-up for compliance monitoring  3. Patients for drug education  4. Patients with statin intolerance  5. PCSK9 inhibitor education and prior authorization approvals  6. Patients with triglycerides <500 mg/dL  External Lipid Clinic Referral  External lipid clinic referrals are for providers outside of [facility], considered new clinic patients - automatically routed to MD schedule   Amb Referral to Cardiac Rehabilitation   Complete by: As directed    Diagnosis: Coronary Stents   After initial evaluation and assessments completed: Virtual Based Care may be provided alone or in conjunction with Phase 2 Cardiac Rehab based on patient barriers.: Yes   Diet - low sodium heart healthy   Complete by: As directed    Diet - low sodium heart healthy   Complete by: As directed    Increase activity slowly   Complete by: As directed    Increase activity slowly   Complete by: As directed    PHASE II - CARDIAC REHAB   Complete by: As  directed        Discharge Medications   Allergies as of 09/07/2020      Reactions   Hydroxychloroquine Other (See Comments)   Terrible Nightmares   Methotrexate Dermatitis   Developed Mouth Sores   Nsaids Dermatitis   Developed Mouth Blisters   Lovastatin Other (See Comments)   Caused abdominal pain that radiated to the back   Pravastatin Other (See Comments)   Caused abdominal pain that radiated to the back   Rosuvastatin Other (See Comments)   Caused abdominal pain that radiated to the back      Medication List    STOP taking these medications   omeprazole 40 MG capsule Commonly known as: PRILOSEC     TAKE these medications   amLODipine 10 MG tablet Commonly known as: NORVASC TAKE ONE (1) TABLET BY MOUTH EVERY DAY What changed:   how much to take  how to take this  when to take this  additional instructions   aspirin EC 81 MG tablet Take 81 mg by mouth daily.   carbamazepine 200 MG tablet Commonly known as: TEGRETOL TAKE ONE TABLET BY MOUTH THREE TIMES A DAY   Cholecalciferol 50 MCG (2000 UT) Caps Take 2,000 Units by mouth daily.   clopidogrel 75 MG tablet Commonly known as: Plavix Take 1 tablet (75 mg total) by mouth daily. Notes to patient: Next dose 9/30   hydrALAZINE 50 MG tablet Commonly known as: APRESOLINE Take 50 mg by mouth 3 (three) times daily.   labetalol 100 MG tablet Commonly known as: NORMODYNE Take 1 tablet (100 mg total) by mouth 2 (two) times daily.   leflunomide 20 MG tablet Commonly known as: ARAVA Take 20 mg by mouth daily.   lithium carbonate 150 MG capsule Take 3 capsules (450 mg total) by mouth at bedtime.   losartan 100 MG tablet Commonly known as: COZAAR Take 100 mg by mouth daily.   Lysine HCl 1000 MG Tabs Take 1,000 mg by mouth 2 (two) times daily.   nitroGLYCERIN 0.4 MG SL tablet Commonly known as: NITROSTAT Place 1 tablet (0.4 mg total) under the tongue every 5 (five) minutes as needed. What changed:  reasons to take this   pantoprazole 40 MG tablet Commonly known as: Protonix Take 1 tablet (40 mg total) by mouth daily. Notes to patient: You start tomorrow          Allergies Allergies  Allergen Reactions  . Hydroxychloroquine Other (See Comments)    Terrible Nightmares  . Methotrexate Dermatitis    Developed Mouth Sores  . Nsaids Dermatitis    Developed Mouth Blisters  . Lovastatin Other (See Comments)    Caused abdominal pain that radiated to the back  . Pravastatin Other (See Comments)    Caused abdominal pain that radiated to the back  . Rosuvastatin Other (See Comments)    Caused abdominal pain that radiated to the back    Outstanding Labs/Studies   None  Duration of Discharge Encounter   Greater than 30 minutes including physician time.  Signed Manson Passey, PA

## 2020-09-06 NOTE — Progress Notes (Signed)
[id number] Notified by Short Stay of pt needing to be seen. Discussed with pt importance of plavix with stent. Reviewed NTG use, walking for exercise, gave heart healthy diet and discussed CRP 2. Referred to [place] CRP 2 at pt's request.  Pt voiced understanding of ed and appreciative of information. Luetta Nutting RN BSN 09/06/2020 4:18 PM

## 2020-09-06 NOTE — Progress Notes (Addendum)
[facility] cardiology fellow in; client c/o nausea and states no chest pressure;rapid response nurse at bedside

## 2020-09-06 NOTE — Progress Notes (Signed)
Client c/o substernal chest pressure 4/10; O2 started @ 4l/min; EKG done, NTG given; Janell Quiet notified and she will have cardiac fellow come to see client

## 2020-09-06 NOTE — Progress Notes (Signed)
States chest pressure now 2/10; 2nd  NTG given

## 2020-09-06 NOTE — Interval H&P Note (Signed)
History and Physical Interval Note:  09/06/2020 2:01 PM  BLAYN WHETSELL  has presented today for surgery, with the diagnosis of angina.  The various methods of treatment have been discussed with the patient and family. After consideration of risks, benefits and other options for treatment, the patient has consented to  Procedure(s): LEFT HEART CATH AND CORONARY ANGIOGRAPHY (N/A) as a surgical intervention.  The patient's history has been reviewed, patient examined, no change in status, stable for surgery.  I have reviewed the patient's chart and labs.  Questions were answered to the patient's satisfaction.     Tonny Bollman

## 2020-09-06 NOTE — Progress Notes (Signed)
Client color pink, c/o 10/10 right leg pain; right groin stable, no bleeding or hematoma; c/o neck pain and asked for ice pack and ice pack placed to neck; client states " I feel better"

## 2020-09-06 NOTE — Progress Notes (Addendum)
Client states " I feel like crap" color pale, diaphoretic, IV unable to flush; attempt to restart IV x 2 without success

## 2020-09-06 NOTE — Discharge Instructions (Signed)
Please take your aspirin and plavix daily as prescribed. Missing doses of these medications puts you at risk for forming a blockage in your stent and having a heart attack.   Please STOP omeprazole. This medication can decrease the effectiveness of plavix (clopidogrel) which is working to keep your stent open. We have sent a prescription for pantoprazole as an alternative antacid medication.    We have sent a referral to our [facility] to discuss alternative cholesterol lowering medications given your prior intolerance of statin medications. Please keep an ear out from our office who will be contacting you to arrange this appointment.  Angiogram, Care After This sheet gives you information about how to care for yourself after your procedure. Your health care provider may also give you more specific instructions. If you have problems or questions, contact your health care provider. What can I expect after the procedure? After the procedure, it is common to have bruising and tenderness at the catheter insertion area. Follow these instructions at home: Insertion site care  Follow instructions from your health care provider about how to take care of your insertion site. Make sure you: ? Wash your hands with soap and water before you change your bandage (dressing). If soap and water are not available, use hand sanitizer. ? Change your dressing as told by your health care provider. ? Leave stitches (sutures), skin glue, or adhesive strips in place. These skin closures may need to stay in place for 2 weeks or longer. If adhesive strip edges start to loosen and curl up, you may trim the loose edges. Do not remove adhesive strips completely unless your health care provider tells you to do that.  Do not take baths, swim, or use a hot tub until your health care provider approves.  You may shower 24-48 hours after the procedure or as told by your health care provider. ? Gently wash the site with plain  soap and water. ? Pat the area dry with a clean towel. ? Do not rub the site. This may cause bleeding.  Do not apply powder or lotion to the site. Keep the site clean and dry.  Check your insertion site every day for signs of infection. Check for: ? Redness, swelling, or pain. ? Fluid or blood. ? Warmth. ? Pus or a bad smell. Activity  Rest as told by your health care provider, usually for 1-2 days.  Do not lift anything that is heavier than 10 lbs. (4.5 kg) or as told by your health care provider.  Do not drive for 24 hours if you were given a medicine to help you relax (sedative).  Do not drive or use heavy machinery while taking prescription pain medicine. General instructions   Return to your normal activities as told by your health care provider, usually in about a week. Ask your health care provider what activities are safe for you.  If the catheter site starts bleeding, lie flat and put pressure on the site. If the bleeding does not stop, get help right away. This is a medical emergency.  Drink enough fluid to keep your urine clear or pale yellow. This helps flush the contrast dye from your body.  Take over-the-counter and prescription medicines only as told by your health care provider.  Keep all follow-up visits as told by your health care provider. This is important. Contact a health care provider if:  You have a fever or chills.  You have redness, swelling, or pain around your insertion  site.  You have fluid or blood coming from your insertion site.  The insertion site feels warm to the touch.  You have pus or a bad smell coming from your insertion site.  You have bruising around the insertion site.  You notice blood collecting in the tissue around the catheter site (hematoma). The hematoma may be painful to the touch. Get help right away if:  You have severe pain at the catheter insertion area.  The catheter insertion area swells very fast.  The  catheter insertion area is bleeding, and the bleeding does not stop when you hold steady pressure on the area.  The area near or just beyond the catheter insertion site becomes pale, cool, tingly, or numb. These symptoms may represent a serious problem that is an emergency. Do not wait to see if the symptoms will go away. Get medical help right away. Call your local emergency services (911 in the [place] Do not drive yourself to the hospital. Summary  After the procedure, it is common to have bruising and tenderness at the catheter insertion area.  After the procedure, it is important to rest and drink plenty of fluids.  Do not take baths, swim, or use a hot tub until your health care provider says it is okay to do so. You may shower 24-48 hours after the procedure or as told by your health care provider.  If the catheter site starts bleeding, lie flat and put pressure on the site. If the bleeding does not stop, get help right away. This is a medical emergency. This information is not intended to replace advice given to you by your health care provider. Make sure you discuss any questions you have with your health care provider. Document Revised: 11/07/2017 Document Reviewed: 10/30/2016 Elsevier Patient Education  2020 [employer]  Radial Site Care  This sheet gives you information about how to care for yourself after your procedure. Your health care provider may also give you more specific instructions. If you have problems or questions, contact your health care provider. What can I expect after the procedure? After the procedure, it is common to have:  Bruising and tenderness at the catheter insertion area. Follow these instructions at home: Medicines  Take over-the-counter and prescription medicines only as told by your health care provider. Insertion site care  Follow instructions from your health care provider about how to take care of your insertion site. Make sure you: ? Wash  your hands with soap and water before you change your bandage (dressing). If soap and water are not available, use hand sanitizer. ? Change your dressing as told by your health care provider. ? Leave stitches (sutures), skin glue, or adhesive strips in place. These skin closures may need to stay in place for 2 weeks or longer. If adhesive strip edges start to loosen and curl up, you may trim the loose edges. Do not remove adhesive strips completely unless your health care provider tells you to do that.  Check your insertion site every day for signs of infection. Check for: ? Redness, swelling, or pain. ? Fluid or blood. ? Pus or a bad smell. ? Warmth.  Do not take baths, swim, or use a hot tub until your health care provider approves.  You may shower 24-48 hours after the procedure, or as directed by your health care provider. ? Remove the dressing and gently wash the site with plain soap and water. ? Pat the area dry with a clean towel. ?  Do not rub the site. That could cause bleeding.  Do not apply powder or lotion to the site. Activity   For 24 hours after the procedure, or as directed by your health care provider: ? Do not flex or bend the affected arm. ? Do not push or pull heavy objects with the affected arm. ? Do not drive yourself home from the hospital or clinic. You may drive 24 hours after the procedure unless your health care provider tells you not to. ? Do not operate machinery or power tools.  Do not lift anything that is heavier than 10 lb (4.5 kg), or the limit that you are told, until your health care provider says that it is safe.  Ask your health care provider when it is okay to: ? Return to work or school. ? Resume usual physical activities or sports. ? Resume sexual activity. General instructions  If the catheter site starts to bleed, raise your arm and put firm pressure on the site. If the bleeding does not stop, get help right away. This is a medical  emergency.  If you went home on the same day as your procedure, a responsible adult should be with you for the first 24 hours after you arrive home.  Keep all follow-up visits as told by your health care provider. This is important. Contact a health care provider if:  You have a fever.  You have redness, swelling, or yellow drainage around your insertion site. Get help right away if:  You have unusual pain at the radial site.  The catheter insertion area swells very fast.  The insertion area is bleeding, and the bleeding does not stop when you hold steady pressure on the area.  Your arm or hand becomes pale, cool, tingly, or numb. These symptoms may represent a serious problem that is an emergency. Do not wait to see if the symptoms will go away. Get medical help right away. Call your local emergency services (911 in the [place] Do not drive yourself to the hospital. Summary  After the procedure, it is common to have bruising and tenderness at the site.  Follow instructions from your health care provider about how to take care of your radial site wound. Check the wound every day for signs of infection.  Do not lift anything that is heavier than 10 lb (4.5 kg), or the limit that you are told, until your health care provider says that it is safe. This information is not intended to replace advice given to you by your health care provider. Make sure you discuss any questions you have with your health care provider. Document Revised: 12/31/2017 Document Reviewed: 12/31/2017 Elsevier Patient Education  2020 [employer].

## 2020-09-07 ENCOUNTER — Encounter (HOSPITAL_COMMUNITY): Payer: Self-pay | Admitting: Cardiovascular Disease

## 2020-09-07 DIAGNOSIS — I10 Essential (primary) hypertension: Secondary | ICD-10-CM | POA: Diagnosis not present

## 2020-09-07 DIAGNOSIS — Z79899 Other long term (current) drug therapy: Secondary | ICD-10-CM | POA: Diagnosis not present

## 2020-09-07 DIAGNOSIS — I700 Atherosclerosis of aorta: Secondary | ICD-10-CM | POA: Diagnosis not present

## 2020-09-07 DIAGNOSIS — Z7982 Long term (current) use of aspirin: Secondary | ICD-10-CM | POA: Diagnosis not present

## 2020-09-07 DIAGNOSIS — I209 Angina pectoris, unspecified: Secondary | ICD-10-CM

## 2020-09-07 LAB — BASIC METABOLIC PANEL
Anion gap: 11 (ref 5–15)
BUN: 18 mg/dL (ref 8–23)
CO2: 20 mmol/L — ABNORMAL LOW (ref 22–32)
Calcium: 8.6 mg/dL — ABNORMAL LOW (ref 8.9–10.3)
Chloride: 109 mmol/L (ref 98–111)
Creatinine, Ser: 1.26 mg/dL — ABNORMAL HIGH (ref 0.61–1.24)
GFR calc Af Amer: >60 mL/min (ref >60)
GFR calc non Af Amer: 58 mL/min — ABNORMAL LOW (ref >60)
Glucose, Bld: 118 mg/dL — ABNORMAL HIGH (ref 70–99)
Potassium: 4.1 mmol/L (ref 3.5–5.1)
Sodium: 140 mmol/L (ref 135–145)

## 2020-09-07 LAB — CBC
HCT: 31 % — ABNORMAL LOW (ref 39.0–52.0)
Hemoglobin: 10.2 g/dL — ABNORMAL LOW (ref 13.0–17.0)
MCH: 30.7 pg (ref 26.0–34.0)
MCHC: 32.9 g/dL (ref 30.0–36.0)
MCV: 93.4 fL (ref 80.0–100.0)
Platelets: 216 10*3/uL (ref 150–400)
RBC: 3.32 MIL/uL — ABNORMAL LOW (ref 4.22–5.81)
RDW: 13 % (ref 11.5–15.5)
WBC: 6.4 10*3/uL (ref 4.0–10.5)
nRBC: 0 % (ref 0.0–0.2)

## 2020-09-07 MED FILL — Heparin Sodium (Porcine) Inj 1000 Unit/ML: INTRAMUSCULAR | Qty: 10 | Status: AC

## 2020-09-07 MED FILL — Fentanyl Citrate Preservative Free (PF) Inj 100 MCG/2ML: INTRAMUSCULAR | Qty: 2 | Status: AC

## 2020-09-07 MED FILL — Midazolam HCl Inj 2 MG/2ML (Base Equivalent): INTRAMUSCULAR | Qty: 2 | Status: AC

## 2020-09-07 NOTE — Progress Notes (Signed)
IV and telemetry discontinued a this time. CCMD notified. Discharge instructions reviewed with patient. All questions answered.

## 2020-09-07 NOTE — Progress Notes (Signed)
CARDIAC REHAB PHASE I   PRE:  Rate/Rhythm: 55 SB  BP:  Supine: 149/63  Sitting:   Standing:    SaO2: 99%RA  MODE:  Ambulation: 370 ft   POST:  Rate/Rhythm: 67 SR  BP:  Supine:   Sitting: 164/67  Standing:    SaO2: 97%RA 1007-1040 Pt walked 370 ft on RA with rolling walker (due to arthritis). Gait slow and steady. No CP. Tolerated well. To sitting on side of bed with call bell. Education completed yesterday.   Luetta Nutting, RN BSN  09/07/2020 10:36 AM

## 2020-09-07 NOTE — Plan of Care (Signed)
Discharge to home °

## 2020-09-07 NOTE — Progress Notes (Signed)
Cardiology Progress Note  Mr. 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 Around 8pm he began feeling generally unwell and complained of chest discomfort for which I was notified.   On my arrival he appears uncomfortable and complaining primarily of nausea and just feeling generally unwell. POC glucose was normal. BP 170s/50s, transiently dropping as low as 80s/30s after 2 SL NTG. The nitro initially did not provide any improvement, though his chest pain later resolved completely. I reviewed the ECG performed at the onset of his chest pain and repeated a second tracing - both of these show similar RBBB without any ischemic changes. Functional IV was placed for IV fluids, though blood pressure quickly rebounded to 160s systolic. POCUS performed but he would not roll to left side and supine windows were very limited - no clear effusion was noted and wall motion seems to be normal. He subsequently developed various other complaints, including leg pain without any findings on exam which resolved, then neck stiffness, then tremors which were clearly non-physiologic. At this point, decision was made to keep overnight for observation. BMP and CBC both rechecked and were normal.  Troponin within expected range post-PCI at 131. He is overall feeling better.

## 2020-09-22 DIAGNOSIS — D649 Anemia, unspecified: Secondary | ICD-10-CM | POA: Insufficient documentation

## 2020-09-22 DIAGNOSIS — E785 Hyperlipidemia, unspecified: Secondary | ICD-10-CM | POA: Insufficient documentation

## 2020-09-22 DIAGNOSIS — F319 Bipolar disorder, unspecified: Secondary | ICD-10-CM | POA: Insufficient documentation

## 2020-09-22 DIAGNOSIS — B019 Varicella without complication: Secondary | ICD-10-CM | POA: Insufficient documentation

## 2020-09-22 DIAGNOSIS — M1990 Unspecified osteoarthritis, unspecified site: Secondary | ICD-10-CM | POA: Insufficient documentation

## 2020-09-22 DIAGNOSIS — I10 Essential (primary) hypertension: Secondary | ICD-10-CM | POA: Insufficient documentation

## 2020-09-22 DIAGNOSIS — J42 Unspecified chronic bronchitis: Secondary | ICD-10-CM | POA: Insufficient documentation

## 2020-09-22 DIAGNOSIS — F32A Depression, unspecified: Secondary | ICD-10-CM | POA: Insufficient documentation

## 2020-09-22 DIAGNOSIS — I00 Rheumatic fever without heart involvement: Secondary | ICD-10-CM | POA: Insufficient documentation

## 2020-09-25 ENCOUNTER — Other Ambulatory Visit: Payer: Self-pay

## 2020-09-25 ENCOUNTER — Encounter: Payer: Self-pay | Admitting: Cardiology

## 2020-09-25 ENCOUNTER — Ambulatory Visit: Payer: Medicare HMO | Admitting: Cardiology

## 2020-09-25 VITALS — BP 144/60 | HR 62 | Ht 67.0 in | Wt 160.0 lb

## 2020-09-25 DIAGNOSIS — Z789 Other specified health status: Secondary | ICD-10-CM | POA: Diagnosis not present

## 2020-09-25 DIAGNOSIS — I10 Essential (primary) hypertension: Secondary | ICD-10-CM | POA: Diagnosis not present

## 2020-09-25 DIAGNOSIS — I25119 Atherosclerotic heart disease of native coronary artery with unspecified angina pectoris: Secondary | ICD-10-CM | POA: Diagnosis not present

## 2020-09-25 DIAGNOSIS — E782 Mixed hyperlipidemia: Secondary | ICD-10-CM

## 2020-09-25 MED ORDER — ISOSORBIDE MONONITRATE ER 60 MG PO TB24: 60.0000 mg | ORAL_TABLET | Freq: Every day | ORAL | 3 refills | Status: DC

## 2020-09-25 MED ORDER — CLOPIDOGREL BISULFATE 75 MG PO TABS: 75.0000 mg | ORAL_TABLET | Freq: Every day | ORAL | 3 refills | Status: DC

## 2020-09-25 MED ORDER — PITAVASTATIN CALCIUM 1 MG PO TABS: 1.0000 mg | ORAL_TABLET | Freq: Every day | ORAL | 3 refills | Status: DC

## 2020-09-25 NOTE — Patient Instructions (Addendum)
Medication Instructions:  Your physician has recommended you make the following change in your medication:   Start Imdur 60 mg daily.  Start Livalo 1 mg daily. Start CoQ 10 200 mg daily (over the counter).  *If you need a refill on your cardiac medications before your next appointment, please call your pharmacy*   Lab Work: Your physician recommends that you have labs done in the office today. Your test included  basic metabolic panel, liver function and lipids.  If you have labs (blood work) drawn today and your tests are completely normal, you will receive your results only by: [name] MyChart Message (if you have MyChart) OR . A paper copy in the mail If you have any lab test that is abnormal or we need to change your treatment, we will call you to review the results.   Testing/Procedures: None ordered   Follow-Up: At [facility], you and your health needs are our priority.  As part of our continuing mission to provide you with exceptional heart care, we have created designated Provider Care Teams.  These Care Teams include your primary Cardiologist (physician) and Advanced Practice Providers (APPs -  Physician Assistants and Nurse Practitioners) who all work together to provide you with the care you need, when you need it.  We recommend signing up for the patient portal called "MyChart".  Sign up information is provided on this After Visit Summary.  MyChart is used to connect with patients for Virtual Visits (Telemedicine).  Patients are able to view lab/test results, encounter notes, upcoming appointments, etc.  Non-urgent messages can be sent to your provider as well.   To learn more about what you can do with MyChart, go to [email or web address].    Your next appointment:   2-3 week(s)  The format for your next appointment:   In Person  Provider:   Belva Crome, MD   Other Instructions Pitavastatin oral tablets What is this medicine? PITAVASTATIN (pit A va STAT in)  is known as a HMG-CoA reductase inhibitor or 'statin'. It lowers the level of cholesterol and triglycerides in the blood. Diet and lifestyle changes are often used with this drug. This medicine may be used for other purposes; ask your health care provider or pharmacist if you have questions. COMMON BRAND NAME(S): Livalo, Zypitamag What should I tell my health care provider before I take this medicine? They need to know if you have any of these conditions:  diabetes  if you often drink alcohol  history of stroke  kidney disease  liver disease  muscle aches or weakness  thyroid disease  an unusual or allergic reaction to pitavastatin, other medicines, foods, dyes, or preservatives  pregnant or trying to get pregnant  breast-feeding How should I use this medicine? Take this medicine by mouth with a glass of water. Follow the directions on the prescription label. You can take it with or without food. If it upsets your stomach, take it with food. Take your medicine at regular intervals. Do not take it more often than directed. Do not stop taking except on your doctor's advice. Talk to your pediatrician regarding the use of this medicine in children. While this drug may be prescribed for children as young as 8 years for selected conditions, precautions do apply. Overdosage: If you think you have taken too much of this medicine contact a poison control center or emergency room at once. NOTE: This medicine is only for you. Do not share this medicine with others. What if I miss  a dose? If you miss a dose, take it as soon as you can. If it is almost time for your next dose, take only that dose. Do not take double or extra doses. What may interact with this medicine? Do not take this medicine with any of the following medications:  cyclosporine  gemfibrozil  herbal medicines like red yeast rice This medicine may also interact with the following medications:  alcohol  antiviral  medicines for HIV or AIDS  erythromycin  other medicines for cholesterol  rifampin  warfarin This list may not describe all possible interactions. Give your health care provider a list of all the medicines, herbs, non-prescription drugs, or dietary supplements you use. Also tell them if you smoke, drink alcohol, or use illegal drugs. Some items may interact with your medicine. What should I watch for while using this medicine? Visit your doctor or health care professional for regular check-ups. You may need regular tests to make sure your liver is working properly. Your health care professional may tell you to stop taking this medicine if you develop muscle problems. If your muscle problems do not go away after stopping this medicine, contact your health care professional. Do not become pregnant while taking this medicine. Women should inform their health care professional if they wish to become pregnant or think they might be pregnant. There is a potential for serious side effects to an unborn child. Talk to your health care professional or pharmacist for more information. Do not breast-feed an infant while taking this medicine. This medicine may increase blood sugar. Ask your healthcare provider if changes in diet or medicines are needed if you have diabetes. If you are going to need surgery or other procedure, tell your doctor that you are using this medicine. This drug is only part of a total heart-health program. Your doctor or a dietician can suggest a low-cholesterol and low-fat diet to help. Avoid alcohol and smoking, and keep a proper exercise schedule. This medicine may cause a decrease in Co-Enzyme Q-10. You should make sure that you get enough Co-Enzyme Q-10 while you are taking this medicine. Discuss the foods you eat and the vitamins you take with your health care professional. What side effects may I notice from receiving this medicine? Side effects that you should report to your  doctor or health care professional as soon as possible:  allergic reactions like skin rash, itching or hives, swelling of the face, lips, or tongue  confusion  joint pain  loss of memory  redness, blistering, peeling or loosening of the skin, including inside the mouth  signs and symptoms of high blood sugar such as being more thirsty or hungry or having to urinate more than normal. You may also feel very tired or have blurry vision.  signs and symptoms of muscle injury like dark urine; trouble passing urine or change in the amount of urine; unusually weak or tired; muscle pain or side or back pain  yellowing of the eyes or skin Side effects that usually do not require medical attention (report to your doctor or health care professional if they continue or are bothersome):  constipation  diarrhea  dizziness  gas  headache  nausea  stomach pain  trouble sleeping  upset stomach This list may not describe all possible side effects. Call your doctor for medical advice about side effects. You may report side effects to FDA at 1-800-FDA-1088. Where should I keep my medicine? Keep out of the reach of children. Store at  room temperature between 15 and 30 degrees C (59 and 86 degrees F). Protect from light. Throw away any unused medicine after the expiration date. NOTE: This sheet is a summary. It may not cover all possible information. If you have questions about this medicine, talk to your doctor, pharmacist, or health care provider.  2020 Elsevier/Gold Standard (2018-09-17 08:15:37) Ranolazine tablets, extended release What is this medicine? RANOLAZINE (ra NOE la zeen) is a heart medicine. It is used to treat chronic chest pain (angina). This medicine must be taken regularly. It will not relieve an acute episode of chest pain. This medicine may be used for other purposes; ask your health care provider or pharmacist if you have questions. COMMON BRAND NAME(S): Ranexa What  should I tell my health care provider before I take this medicine? They need to know if you have any of these conditions:  heart disease  irregular heartbeat  kidney disease  liver disease  low levels of potassium or magnesium in the blood  an unusual or allergic reaction to ranolazine, other medicines, foods, dyes, or preservatives  pregnant or trying to get pregnant  breast-feeding How should I use this medicine? Take this medicine by mouth with a glass of water. Follow the directions on the prescription label. Do not cut, crush, or chew this medicine. Take with or without food. Do not take this medication with grapefruit juice. Take your doses at regular intervals. Do not take your medicine more often then directed. Talk to your pediatrician regarding the use of this medicine in children. Special care may be needed. Overdosage: If you think you have taken too much of this medicine contact a poison control center or emergency room at once. NOTE: This medicine is only for you. Do not share this medicine with others. What if I miss a dose? If you miss a dose, take it as soon as you can. If it is almost time for your next dose, take only that dose. Do not take double or extra doses. What may interact with this medicine? Do not take this medicine with any of the following medications:  antivirals for HIV or AIDS  cerivastatin  certain antibiotics like chloramphenicol, clarithromycin, dalfopristin; quinupristin, isoniazid, rifabutin, rifampin, rifapentine  certain medicines used for cancer like imatinib, nilotinib  certain medicines for fungal infections like fluconazole, itraconazole, ketoconazole, posaconazole, voriconazole  certain medicines for irregular heart beat like dronedarone  certain medicines for seizures like carbamazepine, fosphenytoin, oxcarbazepine, phenobarbital, phenytoin  cisapride  conivaptan  cyclosporine  grapefruit or grapefruit juice  lumacaftor;  ivacaftor  nefazodone  pimozide  quinacrine  St John's wort  thioridazine This medicine may also interact with the following medications:  alfuzosin  certain medicines for depression, anxiety, or psychotic disturbances like bupropion, citalopram, fluoxetine, fluphenazine, paroxetine, perphenazine, risperidone, sertraline, trifluoperazine  certain medicines for cholesterol like atorvastatin, lovastatin, simvastatin  certain medicines for stomach problems like octreotide, palonosetron, prochlorperazine  eplerenone  ergot alkaloids like dihydroergotamine, ergonovine, ergotamine, methylergonovine  metformin  nicardipine  other medicines that prolong the QT interval (cause an abnormal heart rhythm) like dofetilide, ziprasidone  sirolimus  tacrolimus This list may not describe all possible interactions. Give your health care provider a list of all the medicines, herbs, non-prescription drugs, or dietary supplements you use. Also tell them if you smoke, drink alcohol, or use illegal drugs. Some items may interact with your medicine. What should I watch for while using this medicine? Visit your doctor for regular check ups. Tell your doctor or healthcare  professional if your symptoms do not start to get better or if they get worse. This medicine will not relieve an acute attack of angina or chest pain. This medicine can change your heart rhythm. Your health care provider may check your heart rhythm by ordering an electrocardiogram (ECG) while you are taking this medicine. You may get drowsy or dizzy. Do not drive, use machinery, or do anything that needs mental alertness until you know how this medicine affects you. Do not stand or sit up quickly, especially if you are an older patient. This reduces the risk of dizzy or fainting spells. Alcohol may interfere with the effect of this medicine. Avoid alcoholic drinks. If you are scheduled for any medical or dental procedure, tell your  healthcare provider that you are taking this medicine. This medicine can interact with other medicines used during surgery. What side effects may I notice from receiving this medicine? Side effects that you should report to your doctor or health care professional as soon as possible:  allergic reactions like skin rash, itching or hives, swelling of the face, lips, or tongue  breathing problems  changes in vision  fast, irregular or pounding heartbeat  feeling faint or lightheaded, falls  low or high blood pressure  numbness or tingling feelings  ringing in the ears  tremor or shakiness  slow heartbeat (fewer than 50 beats per minute)  swelling of the legs or feet Side effects that usually do not require medical attention (report to your doctor or health care professional if they continue or are bothersome):  constipation  drowsy  dry mouth  headache  nausea or vomiting  stomach upset This list may not describe all possible side effects. Call your doctor for medical advice about side effects. You may report side effects to FDA at 1-800-FDA-1088. Where should I keep my medicine? Keep out of the reach of children. Store at room temperature between 15 and 30 degrees C (59 and 86 degrees F). Throw away any unused medicine after the expiration date. NOTE: This sheet is a summary. It may not cover all possible information. If you have questions about this medicine, talk to your doctor, pharmacist, or health care provider.  2020 Elsevier/Gold Standard (2018-11-17 09:18:49) Isosorbide Mononitrate extended-release tablets What is this medicine? ISOSORBIDE MONONITRATE (eye soe SOR bide mon oh NYE trate) is a vasodilator. It relaxes blood vessels, increasing the blood and oxygen supply to your heart. This medicine is used to prevent chest pain caused by angina. It will not help to stop an episode of chest pain. This medicine may be used for other purposes; ask your health care  provider or pharmacist if you have questions. COMMON BRAND NAME(S): Imdur, Isotrate ER What should I tell my health care provider before I take this medicine? They need to know if you have any of these conditions:  previous heart attack or heart failure  an unusual or allergic reaction to isosorbide mononitrate, nitrates, other medicines, foods, dyes, or preservatives  pregnant or trying to get pregnant  breast-feeding How should I use this medicine? Take this medicine by mouth with a glass of water. Follow the directions on the prescription label. Do not crush or chew. Take your medicine at regular intervals. Do not take your medicine more often than directed. Do not stop taking this medicine except on the advice of your doctor or health care professional. Talk to your pediatrician regarding the use of this medicine in children. Special care may be needed. Overdosage: If  you think you have taken too much of this medicine contact a poison control center or emergency room at once. NOTE: This medicine is only for you. Do not share this medicine with others. What if I miss a dose? If you miss a dose, take it as soon as you can. If it is almost time for your next dose, take only that dose. Do not take double or extra doses. What may interact with this medicine? Do not take this medicine with any of the following medications:  medicines used to treat erectile dysfunction (ED) like avanafil, sildenafil, tadalafil, and vardenafil  riociguat This medicine may also interact with the following medications:  medicines for high blood pressure  other medicines for angina or heart failure This list may not describe all possible interactions. Give your health care provider a list of all the medicines, herbs, non-prescription drugs, or dietary supplements you use. Also tell them if you smoke, drink alcohol, or use illegal drugs. Some items may interact with your medicine. What should I watch for while  using this medicine? Check your heart rate and blood pressure regularly while you are taking this medicine. Ask your doctor or health care professional what your heart rate and blood pressure should be and when you should contact him or her. Tell your doctor or health care professional if you feel your medicine is no longer working. You may get dizzy. Do not drive, use machinery, or do anything that needs mental alertness until you know how this medicine affects you. To reduce the risk of dizzy or fainting spells, do not sit or stand up quickly, especially if you are an older patient. Alcohol can make you more dizzy, and increase flushing and rapid heartbeats. Avoid alcoholic drinks. Do not treat yourself for coughs, colds, or pain while you are taking this medicine without asking your doctor or health care professional for advice. Some ingredients may increase your blood pressure. What side effects may I notice from receiving this medicine? Side effects that you should report to your doctor or health care professional as soon as possible:  bluish discoloration of lips, fingernails, or palms of hands  irregular heartbeat, palpitations  low blood pressure  nausea, vomiting  persistent headache  unusually weak or tired Side effects that usually do not require medical attention (report to your doctor or health care professional if they continue or are bothersome):  flushing of the face or neck  rash This list may not describe all possible side effects. Call your doctor for medical advice about side effects. You may report side effects to FDA at 1-800-FDA-1088. Where should I keep my medicine? Keep out of the reach of children. Store between 15 and 30 degrees C (59 and 86 degrees F). Keep container tightly closed. Throw away any unused medicine after the expiration date. NOTE: This sheet is a summary. It may not cover all possible information. If you have questions about this medicine, talk to  your doctor, pharmacist, or health care provider.  2020 Elsevier/Gold Standard (2013-09-24 14:48:19)

## 2020-09-25 NOTE — Progress Notes (Signed)
Cardiology Office Note:    Date:  09/25/2020   ID:  Noah Grant, DOB 1951-08-19, MRN [id number]  PCP:  Sharlene Dory, DO  Cardiologist:  Garwin Brothers, MD   Referring MD: Sharlene Dory*    ASSESSMENT:    1. Coronary artery disease involving native coronary artery of native heart with angina pectoris (HCC)   2. Mixed hyperlipidemia   3. Hypertension, unspecified type   4. Statin intolerance    PLAN:    In order of problems listed above:  1. Coronary artery disease: Secondary prevention stressed with the patient. Importance of compliance with diet medication stressed any vocalized understanding. Coronary angiography report discussed with him at extensive length and questions were answered to his satisfaction. 2. Stable angina: Following recommendations were made. I added isosorbide mononitrate 60 mg daily. He will also start Ranexa 1 g twice daily. Benefits and potential risks of these medications explained and he vocalized understanding. 3. Essential hypertension: Stable and above medication will also help blood pressure issues. Lifestyle modification was urged. 4. Mixed dyslipidemia statin intolerance: I discussed this with him at extensive length. He will have blood work including lipids today. I suggested Livalo, benefits and potential risks were explained. He will start 1 mg. He will take co-Q10 along with him to see if it helps. We will see him in 2 to 3 weeks for follow-up. 5. He knows to go to the nearest emergency room for any concerning symptoms.   Medication Adjustments/Labs and Tests Ordered: Current medicines are reviewed at length with the patient today.  Concerns regarding medicines are outlined above.  No orders of the defined types were placed in this encounter.  No orders of the defined types were placed in this encounter.    No chief complaint on file.    History of Present Illness:    Noah Grant is a 69 y.o. male. Patient  underwent coronary angiography. The details are mentioned below and he underwent stenting. He gives history of chest pain at times relieved with nitroglycerin. He also walks 30 to 35 minutes without any problems. He has rheumatoid arthritis. No orthopnea or PND. He is happy with the procedure and subsequently has had no issues. At the time of my evaluation, the patient is alert awake oriented and in no distress.  Past Medical History:  Diagnosis Date  . Abdominal aortic atherosclerosis (HCC)   . Anemia    likely of chronic disease  . Angina pectoris (HCC) 08/30/2020  . Arthritis   . Barrett's esophagus without dysplasia 03/25/2019   Formatting of this note might be different from the original. EGD done in 02/2019. Due in 3 years.  . Bipolar 1 disorder (HCC)   . Bipolar 1 disorder, mixed, moderate (HCC) 08/16/2014  . Bipolar affective disorder, currently depressed, moderate (HCC) 03/08/2020  . Bipolar depression (HCC) 08/16/2014  . BPH with urinary obstruction 12/29/2018  . Brain ischemia 03/08/2020  . Cardiac murmur 08/30/2020  . Chest pain 09/06/2020  . Chicken pox   . Chronic bronchitis (HCC)   . Cognitive complaints 03/08/2020  . Coronary artery disease involving native coronary artery of native heart with angina pectoris (HCC) 09/06/2020  . Depression   . Disc degeneration, 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  . GAD (generalized anxiety disorder) 11/02/2018  . Gastroesophageal reflux disease without esophagitis 08/16/2014   Formatting of this note might be different from the original. Last Assessment & Plan:  Well controlled. Continue current medications.  [name] Herpes gingivostomatitis 02/10/2015  .  History of colon polyps 03/25/2019   Formatting of this note might be different from the original. tubular adenoma  . History of stroke 11/10/2019  . Hyperlipidemia   . Hypertension   . Ingrowing nail 03/12/2020  . Labile hypertension 08/16/2014   Formatting of this note might be different from the original. Last Assessment &  Plan:  Slightly above goal today secondary to pain. Will continue current regimen. Will check CMP today.  . 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 Concern over possible lichen planus. His dentist noticed lesions of his oral mucosa.  It was felt to be lichen planus.  He presents to discuss further.  Rarely does the area hurt.He smoked in the distant past EXAM shows several white patches on the buccal mucosa bilaterally.  No other concerning oral mucosal les  . Long-term use of immunosuppressant medication 03/23/2018  . Low testosterone 09/17/2016  . Midline thoracic back pain 02/10/2015  . Mixed hyperlipidemia 08/16/2014  . Normochromic normocytic anemia 08/16/2014  . PAD (peripheral artery disease) (HCC) 03/23/2019  . Pain of right hip joint 01/19/2016  . Rheumatic fever   . Rheumatoid arthritis (HCC) 10/29/2018   Formatting of this note might be different from the original. Rheum:  Dr. Sharmon Revere Formerly on MTX - stopped due to mouth sores. Currently started on Arava.  Also on 5 mg prednisone x 3 months during run-in.  . Right-sided chest wall pain 04/17/2019  . Seasonal allergies 03/23/2018  . Stage 3a chronic kidney disease (HCC) 07/21/2019  . Statin intolerance 07/31/2020  . Thoracic radiculopathy 06/16/2019  . Tremor 06/16/2019  . Vitamin B12 deficiency    Injections  . Vitamin D deficiency 09/17/2016    Past Surgical History:  Procedure Laterality Date  . CLAVICLE SURGERY     Right, Hardware placed  . CORONARY STENT INTERVENTION N/A 09/06/2020   Procedure: CORONARY STENT INTERVENTION;  Surgeon: Tonny Bollman, MD;  Location: [facility] INVASIVE CV LAB;  Service: Cardiovascular;  Laterality: N/A;  . LEFT HEART CATH AND CORONARY ANGIOGRAPHY N/A 09/06/2020   Procedure: LEFT HEART CATH AND CORONARY ANGIOGRAPHY;  Surgeon: Tonny Bollman, MD;  Location: [facility] INVASIVE CV LAB;  Service: Cardiovascular;  Laterality: N/A;  . WISDOM TOOTH  EXTRACTION      Current Medications: Current Meds  Medication Sig  . amLODipine (NORVASC) 10 MG tablet TAKE ONE (1) TABLET BY MOUTH EVERY DAY  . aspirin EC 81 MG tablet Take 81 mg by mouth daily.  . carbamazepine (TEGRETOL) 200 MG tablet TAKE ONE TABLET BY MOUTH THREE TIMES A DAY  . Cholecalciferol 50 MCG (2000 UT) CAPS Take 2,000 Units by mouth daily.   . clopidogrel (PLAVIX) 75 MG tablet Take 1 tablet (75 mg total) by mouth daily.  . hydrALAZINE (APRESOLINE) 50 MG tablet Take 50 mg by mouth 3 (three) times daily.  [name] labetalol (NORMODYNE) 100 MG tablet Take 1 tablet (100 mg total) by mouth 2 (two) times daily.  [name] leflunomide (ARAVA) 20 MG tablet Take 20 mg by mouth daily.  [name] lithium carbonate 150 MG capsule Take 3 capsules (450 mg total) by mouth at bedtime.  [name] losartan (COZAAR) 100 MG tablet Take 100 mg by mouth daily.  [name] Lysine HCl 1000 MG TABS Take 1,000 mg by mouth 2 (two) times daily.   . nitroGLYCERIN (NITROSTAT) 0.4 MG SL tablet Place 1 tablet (0.4 mg total) under the tongue every 5 (five) minutes as needed.  [name]  pantoprazole (PROTONIX) 40 MG tablet Take 1 tablet (40 mg total) by mouth daily.     Allergies:   Hydroxychloroquine, Methotrexate, Nsaids, Lovastatin, Pravastatin, and Rosuvastatin   Social History   Socioeconomic History  . Marital status: Married    Spouse name: Not on file  . Number of children: Not on file  . Years of education: Not on file  . Highest education level: Not on file  Occupational History  . Not on file  Tobacco Use  . Smoking status: Former Smoker    Types: Cigarettes  . Smokeless tobacco: Never Used  . Tobacco comment: Quit>40 years  Substance and Sexual Activity  . Alcohol use: Not Currently    Alcohol/week: 0.0 standard drinks  . Drug use: Never  . Sexual activity: Not on file  Other Topics Concern  . Not on file  Social History Narrative  . Not on file   Social Determinants of Health   Financial Resource Strain:   . Difficulty  of Paying Living Expenses: Not on file  Food Insecurity:   . Worried About Programme researcher, broadcasting/film/video in the Last Year: Not on file  . Ran Out of Food in the Last Year: Not on file  Transportation Needs:   . Lack of Transportation (Medical): Not on file  . Lack of Transportation (Non-Medical): Not on file  Physical Activity:   . Days of Exercise per Week: Not on file  . Minutes of Exercise per Session: Not on file  Stress:   . Feeling of Stress : Not on file  Social Connections:   . Frequency of Communication with Friends and Family: Not on file  . Frequency of Social Gatherings with Friends and Family: Not on file  . Attends Religious Services: Not on file  . Active Member of Clubs or Organizations: Not on file  . Attends Banker Meetings: Not on file  . Marital Status: Not on file     Family History: The patient's family history includes Alcoholism in his brother and father; Alzheimer's disease (age of onset: 61) in his mother; Heart disease (age of onset: 54) in his paternal grandfather; Heart disease (age of onset: 27) in his maternal grandfather; Hypertension in his father and son; Stroke (age of onset: 17) in his father.  ROS:   Please see the history of present illness.    All other systems reviewed and are negative.  EKGs/Labs/Other Studies Reviewed:    The following studies were reviewed today: Tonny Bollman, MD (Primary)    Procedures  CORONARY STENT INTERVENTION  LEFT HEART CATH AND CORONARY ANGIOGRAPHY  Conclusion  1.  Severe single-vessel coronary artery disease involving the right PDA branch, treated successfully with overlapping drug-eluting stents, 0% residual stenosis 2.  Mild nonobstructive disease in the mid LAD 3.  Normal left main and left circumflex 4.  Known normal LV function by echo assessment with LVEF 60%  Recommendations: Same-day PCI protocol, aspirin and clopidogrel for 6 months without interruption.    Recent Labs: 12/23/2019: TSH  1.400 07/31/2020: ALT 13 09/07/2020: BUN 18; Creatinine, Ser 1.26; Hemoglobin 10.2; Platelets 216; Potassium 4.1; Sodium 140  Recent Lipid Panel    Component Value Date/Time   CHOL 156 05/31/2020 1112   TRIG 188.0 (H) 05/31/2020 1112   HDL 29.90 (L) 05/31/2020 1112   CHOLHDL 5 05/31/2020 1112   VLDL 37.6 05/31/2020 1112   LDLCALC 89 05/31/2020 1112   LDLDIRECT 99.0 12/01/2019 1056    Physical Exam:  VS:  BP (!) 144/60   Pulse 62   Ht 5\' 7"  (1.702 m)   Wt 160 lb 0.6 oz (72.6 kg)   SpO2 99%   BMI 25.07 kg/m     Wt Readings from Last 3 Encounters:  09/25/20 160 lb 0.6 oz (72.6 kg)  09/06/20 165 lb 12.6 oz (75.2 kg)  08/30/20 163 lb (73.9 kg)     GEN: Patient is in no acute distress HEENT: Normal NECK: No JVD; No carotid bruits LYMPHATICS: No lymphadenopathy CARDIAC: Hear sounds regular, 2/6 systolic murmur at the apex. RESPIRATORY:  Clear to auscultation without rales, wheezing or rhonchi  ABDOMEN: Soft, non-tender, non-distended MUSCULOSKELETAL:  No edema; No deformity  SKIN: Warm and dry NEUROLOGIC:  Alert and oriented x 3 PSYCHIATRIC:  Normal affect   Signed, 09/01/20, MD  09/25/2020 10:07 AM    [facility]

## 2020-09-26 LAB — BASIC METABOLIC PANEL
BUN/Creatinine Ratio: 16 (ref 10–24)
BUN: 20 mg/dL (ref 8–27)
CO2: 23 mmol/L (ref 20–29)
Calcium: 9.2 mg/dL (ref 8.6–10.2)
Chloride: 104 mmol/L (ref 96–106)
Creatinine, Ser: 1.23 mg/dL (ref 0.76–1.27)
GFR calc Af Amer: 69 mL/min/{1.73_m2} (ref >59)
GFR calc non Af Amer: 60 mL/min/{1.73_m2} (ref >59)
Glucose: 75 mg/dL (ref 65–99)
Potassium: 4.9 mmol/L (ref 3.5–5.2)
Sodium: 140 mmol/L (ref 134–144)

## 2020-09-26 LAB — HEPATIC FUNCTION PANEL
ALT: 22 IU/L (ref 0–44)
AST: 16 IU/L (ref 0–40)
Albumin: 4.2 g/dL (ref 3.8–4.8)
Alkaline Phosphatase: 77 IU/L (ref 44–121)
Bilirubin Total: <0.2 mg/dL (ref 0.0–1.2)
Bilirubin, Direct: <0.1 mg/dL (ref 0.00–0.40)
Total Protein: 6.2 g/dL (ref 6.0–8.5)

## 2020-09-26 LAB — LIPID PANEL
Chol/HDL Ratio: 5.7 ratio — ABNORMAL HIGH (ref 0.0–5.0)
Cholesterol, Total: 166 mg/dL (ref 100–199)
HDL: 29 mg/dL — ABNORMAL LOW (ref >39)
LDL Chol Calc (NIH): 106 mg/dL — ABNORMAL HIGH (ref 0–99)
Triglycerides: 177 mg/dL — ABNORMAL HIGH (ref 0–149)
VLDL Cholesterol Cal: 31 mg/dL (ref 5–40)

## 2020-09-27 ENCOUNTER — Telehealth: Payer: Self-pay

## 2020-09-27 NOTE — Addendum Note (Signed)
Addended by: Eleonore Chiquito on: 09/27/2020 01:03 PM   Modules accepted: Orders

## 2020-09-27 NOTE — Telephone Encounter (Signed)
PA initiated via Cover My Meds 

## 2020-09-28 ENCOUNTER — Other Ambulatory Visit: Payer: Self-pay | Admitting: Psychiatry

## 2020-09-28 ENCOUNTER — Telehealth: Payer: Self-pay

## 2020-09-28 DIAGNOSIS — F3132 Bipolar disorder, current episode depressed, moderate: Secondary | ICD-10-CM

## 2020-09-28 NOTE — Telephone Encounter (Signed)
Called and informed patient that his Prior Authorization for Livalo was approved and he is able to go pick it up for his pharmacy.

## 2020-09-28 NOTE — Telephone Encounter (Signed)
Left message on patient's voicemail per DPR that his PA was approved for his Livalo and he could pick it up from the pharmacy whenever he would like.  Encourgaed the patient to call back for any further questions.

## 2020-10-03 ENCOUNTER — Other Ambulatory Visit: Payer: Self-pay

## 2020-10-03 ENCOUNTER — Ambulatory Visit (INDEPENDENT_AMBULATORY_CARE_PROVIDER_SITE_OTHER): Payer: Medicare HMO | Admitting: Family Medicine

## 2020-10-03 ENCOUNTER — Encounter: Payer: Self-pay | Admitting: Family Medicine

## 2020-10-03 VITALS — BP 134/62 | HR 67 | Temp 98.3°F | Ht 67.0 in | Wt 159.5 lb

## 2020-10-03 DIAGNOSIS — I10 Essential (primary) hypertension: Secondary | ICD-10-CM | POA: Diagnosis not present

## 2020-10-03 DIAGNOSIS — I700 Atherosclerosis of aorta: Secondary | ICD-10-CM | POA: Diagnosis not present

## 2020-10-03 DIAGNOSIS — R351 Nocturia: Secondary | ICD-10-CM

## 2020-10-03 MED ORDER — SIMVASTATIN 5 MG PO TABS: 5.0000 mg | ORAL_TABLET | Freq: Every day | ORAL | 3 refills | Status: DC

## 2020-10-03 MED ORDER — PANTOPRAZOLE SODIUM 40 MG PO TBEC
40.0000 mg | DELAYED_RELEASE_TABLET | Freq: Every day | ORAL | 2 refills | Status: DC
Start: 2020-10-03 — End: 2021-05-21

## 2020-10-03 MED ORDER — TAMSULOSIN HCL 0.4 MG PO CAPS: 0.4000 mg | ORAL_CAPSULE | Freq: Every day | ORAL | 3 refills | Status: DC

## 2020-10-03 NOTE — Progress Notes (Signed)
Chief Complaint  Patient presents with  . Follow-up    Subjective Noah Grant is a 69 y.o. male who presents for hypertension follow up. He does monitor home blood pressures. Blood pressures ranging from 130's/80's on average. He is compliant with medications- losartan 100 mg/d, labetalol 100 mg bid, hydralazine 50 mg TID, Norvasc 10 mg/d. Patient has these side effects of medication: none He is usually adhering to a healthy diet overall. Current exercise: walking  Hx of CAD and aortic atherosclerosis. He has failed atorva, rosuva, pitava, prava, lovastatin. He gets myalgias. Diet/exercise as above.   Urinates 5-6 times per night. Tried Myrbetriq without relief. His former urologist thought it was related to his prostate. He has never been on Flomax or Finasteride. No bleeding or pain. He tries to limit his water intake, but also gets muscle aches from statins.    Past Medical History:  Diagnosis Date  . Abdominal aortic atherosclerosis (HCC)   . Anemia    likely of chronic disease  . Angina pectoris (HCC) 08/30/2020  . Arthritis   . Barrett's esophagus without dysplasia 03/25/2019   Formatting of this note might be different from the original. EGD done in 02/2019. Due in 3 years.  . Bipolar 1 disorder (HCC)   . Bipolar 1 disorder, mixed, moderate (HCC) 08/16/2014  . Bipolar affective disorder, currently depressed, moderate (HCC) 03/08/2020  . Bipolar depression (HCC) 08/16/2014  . BPH with urinary obstruction 12/29/2018  . Brain ischemia 03/08/2020  . Cardiac murmur 08/30/2020  . Chest pain 09/06/2020  . Chicken pox   . Chronic bronchitis (HCC)   . Cognitive complaints 03/08/2020  . Coronary artery disease involving native coronary artery of native heart with angina pectoris (HCC) 09/06/2020  . Depression   . Disc degeneration, 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  . GAD (generalized anxiety disorder) 11/02/2018  . Gastroesophageal reflux disease without esophagitis 08/16/2014   Formatting of this note  might be different from the original. Last Assessment & Plan:  Well controlled. Continue current medications.  [name] Herpes gingivostomatitis 02/10/2015  . History of colon polyps 03/25/2019   Formatting of this note might be different from the original. tubular adenoma  . History of stroke 11/10/2019  . Hyperlipidemia   . Hypertension   . Ingrowing nail 03/12/2020  . 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 Concern over possible lichen planus. His dentist noticed lesions of his oral mucosa.  It was felt to be lichen planus.  He presents to discuss further.  Rarely does the area hurt.He smoked in the distant past EXAM shows several white patches on the buccal mucosa bilaterally.  No other concerning oral mucosal les  . Long-term use of immunosuppressant medication 03/23/2018  . Low testosterone 09/17/2016  . Midline thoracic back pain 02/10/2015  . Mixed hyperlipidemia 08/16/2014  . Normochromic normocytic anemia 08/16/2014  . PAD (peripheral artery disease) (HCC) 03/23/2019  . Pain of right hip joint 01/19/2016  . Rheumatic fever   . Rheumatoid arthritis (HCC) 10/29/2018   Formatting of this note might be different from the original. Rheum:  Dr. Sharmon Revere Formerly on MTX - stopped due to mouth sores. Currently started on Arava.  Also on 5 mg prednisone x 3 months during run-in.  . Right-sided chest wall pain 04/17/2019  . Seasonal allergies 03/23/2018  . Stage 3a chronic kidney disease (HCC) 07/21/2019  . Statin intolerance 07/31/2020  . Thoracic radiculopathy 06/16/2019  . Tremor 06/16/2019  .  Vitamin B12 deficiency    Injections  . Vitamin D deficiency 09/17/2016    Exam BP 134/62 (BP Location: Left Arm, Patient Position: Sitting, Cuff Size: Normal)   Pulse 67   Temp 98.3 F (36.8 C) (Oral)   Ht 5\' 7"  (1.702 m)   Wt 159 lb 8 oz (72.3 kg)   SpO2 98%   BMI 24.98 kg/m  General:  well developed, well nourished, in no apparent  distress Heart: RRR, no bruits, no LE edema Lungs: clear to auscultation, no accessory muscle use Psych: well oriented with normal range of affect and appropriate judgment/insight  Hypertension, unspecified type  Abdominal aortic atherosclerosis (HCC) - Plan: simvastatin (ZOCOR) 5 MG tablet  Nocturia  1. Cont losartan 100 mg/d, labetalol 100 mg bid, hydralazine 50 mg TID, Norvasc 10 mg/d. Counseled on diet and exercise. 2. Start simvastatin 5 mg/d, he's not been on this before. Stay hydrated. 3. Start Flomax.  F/u in 1 mo to reck urination. The patient voiced understanding and agreement to the plan.  [place], DO 10/03/20  10:47 AM

## 2020-10-03 NOTE — Patient Instructions (Signed)
Keep the diet clean and stay active.  Aim to do some physical exertion for 150 minutes per week. This is typically divided into 5 days per week, 30 minutes per day. The activity should be enough to get your heart rate up. Anything is better than nothing if you have time constraints.  Stay hydrated.  Let us know if you need anything.

## 2020-10-13 ENCOUNTER — Telehealth (HOSPITAL_COMMUNITY): Payer: Self-pay

## 2020-10-13 ENCOUNTER — Encounter (HOSPITAL_COMMUNITY): Payer: Self-pay

## 2020-10-13 NOTE — Telephone Encounter (Signed)
Attempted to call patient in regards to Cardiac Rehab - LM on VM Mailed letter 

## 2020-10-17 ENCOUNTER — Encounter: Payer: Self-pay | Admitting: Cardiology

## 2020-10-17 ENCOUNTER — Other Ambulatory Visit: Payer: Self-pay

## 2020-10-17 ENCOUNTER — Ambulatory Visit: Payer: Medicare HMO | Admitting: Cardiology

## 2020-10-17 VITALS — BP 140/70 | HR 56 | Ht 67.0 in | Wt 159.0 lb

## 2020-10-17 DIAGNOSIS — E782 Mixed hyperlipidemia: Secondary | ICD-10-CM

## 2020-10-17 DIAGNOSIS — I25119 Atherosclerotic heart disease of native coronary artery with unspecified angina pectoris: Secondary | ICD-10-CM

## 2020-10-17 DIAGNOSIS — I10 Essential (primary) hypertension: Secondary | ICD-10-CM

## 2020-10-17 MED ORDER — EZETIMIBE 10 MG PO TABS: 10.0000 mg | ORAL_TABLET | Freq: Every day | ORAL | 3 refills | Status: DC

## 2020-10-17 NOTE — Patient Instructions (Signed)
Medication Instructions:  Your physician has recommended you make the following change in your medication:   Start zetia 10 mg daily.  *If you need a refill on your cardiac medications before your next appointment, please call your pharmacy*   Lab Work: Your physician recommends that you return for lab work in: 6 weeks (11/28/20). You need to have labs done when you are fasting.  You can come Monday through Friday 8:30 am to 12:00 pm and 1:15 to 4:30. You do not need to make an appointment as the order has already been placed. The labs you are going to have done are BMET, LFT and Lipids.   If you have labs (blood work) drawn today and your tests are completely normal, you will receive your results only by: [name] MyChart Message (if you have MyChart) OR . A paper copy in the mail If you have any lab test that is abnormal or we need to change your treatment, we will call you to review the results.   Testing/Procedures: None ordered   Follow-Up: At [facility], you and your health needs are our priority.  As part of our continuing mission to provide you with exceptional heart care, we have created designated Provider Care Teams.  These Care Teams include your primary Cardiologist (physician) and Advanced Practice Providers (APPs -  Physician Assistants and Nurse Practitioners) who all work together to provide you with the care you need, when you need it.  We recommend signing up for the patient portal called "MyChart".  Sign up information is provided on this After Visit Summary.  MyChart is used to connect with patients for Virtual Visits (Telemedicine).  Patients are able to view lab/test results, encounter notes, upcoming appointments, etc.  Non-urgent messages can be sent to your provider as well.   To learn more about what you can do with MyChart, go to [email or web address].    Your next appointment:   4 month(s)  The format for your next appointment:   In Person  Provider:    Belva Crome, MD   Other Instructions Ezetimibe Tablets What is this medicine? EZETIMIBE (ez ET i mibe) blocks the absorption of cholesterol from the stomach. It can help lower blood cholesterol for patients who are at risk of getting heart disease or a stroke. It is only for patients whose cholesterol level is not controlled by diet. This medicine may be used for other purposes; ask your health care provider or pharmacist if you have questions. COMMON BRAND NAME(S): Zetia What should I tell my health care provider before I take this medicine? They need to know if you have any of these conditions:  liver disease  an unusual or allergic reaction to ezetimibe, medicines, foods, dyes, or preservatives  pregnant or trying to get pregnant  breast-feeding How should I use this medicine? Take this medicine by mouth with a glass of water. Follow the directions on the prescription label. This medicine can be taken with or without food. Take your doses at regular intervals. Do not take your medicine more often than directed. Talk to your pediatrician regarding the use of this medicine in children. Special care may be needed. Overdosage: If you think you have taken too much of this medicine contact a poison control center or emergency room at once. NOTE: This medicine is only for you. Do not share this medicine with others. What if I miss a dose? If you miss a dose, take it as soon as you can. If it is  almost time for your next dose, take only that dose. Do not take double or extra doses. What may interact with this medicine? Do not take this medicine with any of the following medications:  fenofibrate  gemfibrozil This medicine may also interact with the following medications:  antacids  cyclosporine  herbal medicines like red yeast rice  other medicines to lower cholesterol or triglycerides This list may not describe all possible interactions. Give your health care provider a list  of all the medicines, herbs, non-prescription drugs, or dietary supplements you use. Also tell them if you smoke, drink alcohol, or use illegal drugs. Some items may interact with your medicine. What should I watch for while using this medicine? Visit your doctor or health care professional for regular checks on your progress. You will need to have your cholesterol levels checked. If you are also taking some other cholesterol medicines, you will also need to have tests to make sure your liver is working properly. Tell your doctor or health care professional if you get any unexplained muscle pain, tenderness, or weakness, especially if you also have a fever and tiredness. You need to follow a low-cholesterol, low-fat diet while you are taking this medicine. This will decrease your risk of getting heart and blood vessel disease. Exercising and avoiding alcohol and smoking can also help. Ask your doctor or dietician for advice. What side effects may I notice from receiving this medicine? Side effects that you should report to your doctor or health care professional as soon as possible:  allergic reactions like skin rash, itching or hives, swelling of the face, lips, or tongue  dark yellow or brown urine  unusually weak or tired  yellowing of the skin or eyes Side effects that usually do not require medical attention (report to your doctor or health care professional if they continue or are bothersome):  diarrhea  dizziness  headache  stomach upset or pain This list may not describe all possible side effects. Call your doctor for medical advice about side effects. You may report side effects to FDA at 1-800-FDA-1088. Where should I keep my medicine? Keep out of the reach of children. Store at room temperature between 15 and 30 degrees C (59 and 86 degrees F). Protect from moisture. Keep container tightly closed. Throw away any unused medicine after the expiration date. NOTE: This sheet is a  summary. It may not cover all possible information. If you have questions about this medicine, talk to your doctor, pharmacist, or health care provider.  2020 Elsevier/Gold Standard (2012-06-01 15:39:09)

## 2020-10-17 NOTE — Progress Notes (Signed)
Cardiology Office Note:    Date:  10/17/2020   ID:  Noah Grant, DOB [birth year], MRN [id number]  PCP:  Noah Dory, DO  Cardiologist:  Noah Brothers, MD   Referring MD: Noah Grant*    ASSESSMENT:    1. Coronary artery disease involving native coronary artery of native heart with angina pectoris (HCC)   2. Mixed hyperlipidemia   3. Primary hypertension    PLAN:    In order of problems listed above:  1. Coronary artery disease: Secondary prevention stressed with the patient.  Importance of compliance with diet medication stressed and vocalized understanding. 2. Essential hypertension: Blood pressure stable and diet was emphasized.  Salt intake issues and lifestyle modification was urged. 3. Mixed dyslipidemia: LDL needs to be around 70.  200 now.  He is taking 5 mg of simvastatin and tolerating it well.  He is also taking co-Q10.  Ezetimibe 10 mg daily.  Diet was emphasized and he will be back in 6 weeks for liver lipid check. 4. Patient will be seen in follow-up appointment in 6 months or earlier if the patient has any concerns    Medication Adjustments/Labs and Tests Ordered: Current medicines are reviewed at length with the patient today.  Concerns regarding medicines are outlined above.  No orders of the defined types were placed in this encounter.  No orders of the defined types were placed in this encounter.    No chief complaint on file.    History of Present Illness:    Noah Grant is a 69 y.o. male.  Patient has past medical history of coronary artery disease, essential hypertension and mixed dyslipidemia.  He denies any problems at this time and takes care of activities of daily living.  He walks half a hour on a daily basis without any symptoms.  He mentions to me that Livalo was too expensive and he could not afford it.  At the time of my evaluation, the patient is alert awake oriented and in no distress.  Past Medical History:   Diagnosis Date  . Abdominal aortic atherosclerosis (HCC)   . Anemia    likely of chronic disease  . Angina pectoris (HCC) 08/30/2020  . Arthritis   . Barrett's esophagus without dysplasia 03/25/2019   Formatting of this note might be different from the original. EGD done in 02/2019. Due in 3 years.  . Bipolar 1 disorder (HCC)   . Bipolar 1 disorder, mixed, moderate (HCC) 08/16/2014  . Bipolar affective disorder, currently depressed, moderate (HCC) 03/08/2020  . Bipolar depression (HCC) 08/16/2014  . BPH with urinary obstruction 12/29/2018  . Brain ischemia 03/08/2020  . Cardiac murmur 08/30/2020  . Chest pain 09/06/2020  . Chicken pox   . Chronic bronchitis (HCC)   . Cognitive complaints 03/08/2020  . Coronary artery disease involving native coronary artery of native heart with angina pectoris (HCC) 09/06/2020  . Depression   . Disc degeneration, 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  . GAD (generalized anxiety disorder) 11/02/2018  . Gastroesophageal reflux disease without esophagitis 08/16/2014   Formatting of this note might be different from the original. Last Assessment & Plan:  Well controlled. Continue current medications.  [name] Herpes gingivostomatitis 02/10/2015  . History of colon polyps 03/25/2019   Formatting of this note might be different from the original. tubular adenoma  . History of stroke 11/10/2019  . Hyperlipidemia   . Hypertension   . Ingrowing nail 03/12/2020  . Labile hypertension 08/16/2014   Formatting of this  note might be different from the original. Last Assessment & Plan:  Slightly above goal today secondary to pain. Will continue current regimen. Will check CMP today.  . 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 Concern over possible lichen planus. His dentist noticed lesions of his oral mucosa.  It was felt to be lichen planus.  He presents to discuss further.  Rarely does the area hurt.He smoked in the distant past EXAM  shows several white patches on the buccal mucosa bilaterally.  No other concerning oral mucosal les  . Long-term use of immunosuppressant medication 03/23/2018  . Low testosterone 09/17/2016  . Midline thoracic back pain 02/10/2015  . Mixed hyperlipidemia 08/16/2014  . Normochromic normocytic anemia 08/16/2014  . PAD (peripheral artery disease) (HCC) 03/23/2019  . Pain of right hip joint 01/19/2016  . Rheumatic fever   . Rheumatoid arthritis (HCC) 10/29/2018   Formatting of this note might be different from the original. Rheum:  Dr. Sharmon Grant Formerly on MTX - stopped due to mouth sores. Currently started on Arava.  Also on 5 mg prednisone x 3 months during run-in.  . Right-sided chest wall pain 04/17/2019  . Seasonal allergies 03/23/2018  . Stage 3a chronic kidney disease (HCC) 07/21/2019  . Statin intolerance 07/31/2020  . Thoracic radiculopathy 06/16/2019  . Tremor 06/16/2019  . Vitamin B12 deficiency    Injections  . Vitamin D deficiency 09/17/2016    Past Surgical History:  Procedure Laterality Date  . CLAVICLE SURGERY     Right, Hardware placed  . CORONARY STENT INTERVENTION N/A 09/06/2020   Procedure: CORONARY STENT INTERVENTION;  Surgeon: Noah Bollman, MD;  Location: [facility] INVASIVE CV LAB;  Service: Cardiovascular;  Laterality: N/A;  . LEFT HEART CATH AND CORONARY ANGIOGRAPHY N/A 09/06/2020   Procedure: LEFT HEART CATH AND CORONARY ANGIOGRAPHY;  Surgeon: Noah Bollman, MD;  Location: [facility] INVASIVE CV LAB;  Service: Cardiovascular;  Laterality: N/A;  . WISDOM TOOTH EXTRACTION      Current Medications: Current Meds  Medication Sig  . amLODipine (NORVASC) 10 MG tablet TAKE ONE (1) TABLET BY MOUTH EVERY DAY  . aspirin EC 81 MG tablet Take 81 mg by mouth daily.  . carbamazepine (TEGRETOL) 200 MG tablet TAKE ONE TABLET BY MOUTH THREE TIMES A DAY  . Cholecalciferol 50 MCG (2000 UT) CAPS Take 2,000 Units by mouth daily.   . clopidogrel (PLAVIX) 75 MG tablet Take 1 tablet (75 mg total) by mouth  daily.  . Coenzyme Q10 (COQ10) 200 MG CAPS Take 200 mg by mouth daily.  . hydrALAZINE (APRESOLINE) 50 MG tablet Take 50 mg by mouth 3 (three) times daily.  [name] labetalol (NORMODYNE) 100 MG tablet Take 1 tablet (100 mg total) by mouth 2 (two) times daily.  [name] leflunomide (ARAVA) 20 MG tablet Take 20 mg by mouth daily.  [name] lithium carbonate 150 MG capsule Take 3 capsules (450 mg total) by mouth at bedtime.  [name] losartan (COZAAR) 100 MG tablet Take 100 mg by mouth daily.  [name] Lysine HCl 1000 MG TABS Take 1,000 mg by mouth 2 (two) times daily.   . nitroGLYCERIN (NITROSTAT) 0.4 MG SL tablet Place 1 tablet (0.4 mg total) under the tongue every 5 (five) minutes as needed.  . pantoprazole (PROTONIX) 40 MG tablet Take 1 tablet (40 mg total) by mouth daily.  . simvastatin (ZOCOR) 5 MG tablet Take 1 tablet (5 mg total) by mouth daily.  . tamsulosin (  FLOMAX) 0.4 MG CAPS capsule Take 1 capsule (0.4 mg total) by mouth daily.     Allergies:   Hydroxychloroquine, Methotrexate, Nsaids, Lovastatin, Pravastatin, and Rosuvastatin   Social History   Socioeconomic History  . Marital status: Married    Spouse name: Not on file  . Number of children: Not on file  . Years of education: Not on file  . Highest education level: Not on file  Occupational History  . Not on file  Tobacco Use  . Smoking status: Former Smoker    Types: Cigarettes  . Smokeless tobacco: Never Used  . Tobacco comment: Quit>40 years  Substance and Sexual Activity  . Alcohol use: Not Currently    Alcohol/week: 0.0 standard drinks  . Drug use: Never  . Sexual activity: Not on file  Other Topics Concern  . Not on file  Social History Narrative  . Not on file   Social Determinants of Health   Financial Resource Strain:   . Difficulty of Paying Living Expenses: Not on file  Food Insecurity:   . Worried About Programme researcher, broadcasting/film/video in the Last Year: Not on file  . Ran Out of Food in the Last Year: Not on file  Transportation Needs:   .  Lack of Transportation (Medical): Not on file  . Lack of Transportation (Non-Medical): Not on file  Physical Activity:   . Days of Exercise per Week: Not on file  . Minutes of Exercise per Session: Not on file  Stress:   . Feeling of Stress : Not on file  Social Connections:   . Frequency of Communication with Friends and Family: Not on file  . Frequency of Social Gatherings with Friends and Family: Not on file  . Attends Religious Services: Not on file  . Active Member of Clubs or Organizations: Not on file  . Attends Banker Meetings: Not on file  . Marital Status: Not on file     Family History: The patient's family history includes Alcoholism in his brother and father; Alzheimer's disease (age of onset: 24) in his mother; Heart disease (age of onset: 34) in his paternal grandfather; Heart disease (age of onset: 50) in his maternal grandfather; Hypertension in his father and son; Stroke (age of onset: 2) in his father.  ROS:   Please see the history of present illness.    All other systems reviewed and are negative.  EKGs/Labs/Other Studies Reviewed:    The following studies were reviewed today: Noah Bollman, MD (Primary)    Procedures  CORONARY STENT INTERVENTION  LEFT HEART CATH AND CORONARY ANGIOGRAPHY  Conclusion  1.  Severe single-vessel coronary artery disease involving the right PDA branch, treated successfully with overlapping drug-eluting stents, 0% residual stenosis 2.  Mild nonobstructive disease in the mid LAD 3.  Normal left main and left circumflex 4.  Known normal LV function by echo assessment with LVEF 60%  Recommendations: Same-day PCI protocol, aspirin and clopidogrel for 6 months without interruption.    Recent Labs: 12/23/2019: TSH 1.400 09/07/2020: Hemoglobin 10.2; Platelets 216 09/25/2020: ALT 22; BUN 20; Creatinine, Ser 1.23; Potassium 4.9; Sodium 140  Recent Lipid Panel    Component Value Date/Time   CHOL 166 09/25/2020 1034    TRIG 177 (H) 09/25/2020 1034   HDL 29 (L) 09/25/2020 1034   CHOLHDL 5.7 (H) 09/25/2020 1034   CHOLHDL 5 05/31/2020 1112   VLDL 37.6 05/31/2020 1112   LDLCALC 106 (H) 09/25/2020 1034   LDLDIRECT 99.0 12/01/2019 1056  Physical Exam:    VS:  BP 140/70   Pulse (!) 56   Ht 5\' 7"  (1.702 m)   Wt 159 lb 0.6 oz (72.1 kg)   SpO2 99%   BMI 24.91 kg/m     Wt Readings from Last 3 Encounters:  10/17/20 159 lb 0.6 oz (72.1 kg)  10/03/20 159 lb 8 oz (72.3 kg)  09/25/20 160 lb 0.6 oz (72.6 kg)     GEN: Patient is in no acute distress HEENT: Normal NECK: No JVD; No carotid bruits LYMPHATICS: No lymphadenopathy CARDIAC: Hear sounds regular, 2/6 systolic murmur at the apex. RESPIRATORY:  Clear to auscultation without rales, wheezing or rhonchi  ABDOMEN: Soft, non-tender, non-distended MUSCULOSKELETAL:  No edema; No deformity  SKIN: Warm and dry NEUROLOGIC:  Alert and oriented x 3 PSYCHIATRIC:  Normal affect   Signed, Noah Brothers, MD  10/17/2020 10:38 AM    [facility]

## 2020-10-30 ENCOUNTER — Telehealth (HOSPITAL_COMMUNITY): Payer: Self-pay

## 2020-10-30 NOTE — Telephone Encounter (Signed)
No response from pt.  Closed referral  

## 2020-10-31 ENCOUNTER — Other Ambulatory Visit (HOSPITAL_BASED_OUTPATIENT_CLINIC_OR_DEPARTMENT_OTHER): Payer: Self-pay | Admitting: Internal Medicine

## 2020-10-31 ENCOUNTER — Ambulatory Visit: Payer: Medicare HMO | Attending: Internal Medicine

## 2020-10-31 DIAGNOSIS — Z23 Encounter for immunization: Secondary | ICD-10-CM

## 2020-10-31 NOTE — Progress Notes (Signed)
° °  Covid-19 Vaccination Clinic  Name:  Noah Grant    MRN: [id number] DOB: 1951-09-29  10/31/2020  [name] was observed post Covid-19 immunization for 15 minutes without incident. He was provided with Vaccine Information Sheet and instruction to access the V-Safe system.   [name] was instructed to call 911 with any severe reactions post vaccine:  Difficulty breathing   Swelling of face and throat   A fast heartbeat   A bad rash all over body   Dizziness and weakness   Immunizations Administered    No immunizations on file.

## 2020-11-01 MED FILL — MODERNA COVID-19 VACCINE 10: 100 | 1 days supply | Qty: 0 | Fill #0

## 2020-11-15 ENCOUNTER — Other Ambulatory Visit: Payer: Self-pay | Admitting: Psychiatry

## 2020-11-15 DIAGNOSIS — F3132 Bipolar disorder, current episode depressed, moderate: Secondary | ICD-10-CM

## 2020-11-16 ENCOUNTER — Ambulatory Visit: Payer: Medicare HMO | Admitting: Psychiatry

## 2020-11-29 LAB — BASIC METABOLIC PANEL
BUN/Creatinine Ratio: 15 (ref 10–24)
BUN: 17 mg/dL (ref 8–27)
CO2: 23 mmol/L (ref 20–29)
Calcium: 9.1 mg/dL (ref 8.6–10.2)
Chloride: 108 mmol/L — ABNORMAL HIGH (ref 96–106)
Creatinine, Ser: 1.14 mg/dL (ref 0.76–1.27)
GFR calc Af Amer: 75 mL/min/{1.73_m2} (ref >59)
GFR calc non Af Amer: 65 mL/min/{1.73_m2} (ref >59)
Glucose: 92 mg/dL (ref 65–99)
Potassium: 4.9 mmol/L (ref 3.5–5.2)
Sodium: 141 mmol/L (ref 134–144)

## 2020-11-29 LAB — HEPATIC FUNCTION PANEL
ALT: 22 IU/L (ref 0–44)
AST: 16 IU/L (ref 0–40)
Albumin: 4.2 g/dL (ref 3.8–4.8)
Alkaline Phosphatase: 77 IU/L (ref 44–121)
Bilirubin Total: 0.3 mg/dL (ref 0.0–1.2)
Bilirubin, Direct: 0.1 mg/dL (ref 0.00–0.40)
Total Protein: 5.9 g/dL — ABNORMAL LOW (ref 6.0–8.5)

## 2020-11-29 LAB — LIPID PANEL
Chol/HDL Ratio: 3.3 ratio (ref 0.0–5.0)
Cholesterol, Total: 109 mg/dL (ref 100–199)
HDL: 33 mg/dL — ABNORMAL LOW (ref >39)
LDL Chol Calc (NIH): 57 mg/dL (ref 0–99)
Triglycerides: 99 mg/dL (ref 0–149)
VLDL Cholesterol Cal: 19 mg/dL (ref 5–40)

## 2020-12-11 ENCOUNTER — Ambulatory Visit: Payer: Medicare HMO | Admitting: Psychiatry

## 2020-12-14 ENCOUNTER — Emergency Department (HOSPITAL_BASED_OUTPATIENT_CLINIC_OR_DEPARTMENT_OTHER): Payer: HMO

## 2020-12-14 ENCOUNTER — Emergency Department (HOSPITAL_BASED_OUTPATIENT_CLINIC_OR_DEPARTMENT_OTHER)
Admission: EM | Admit: 2020-12-14 | Discharge: 2020-12-14 | Disposition: A | Discharge status: Home / Self Care | Payer: HMO | Attending: Emergency Medicine | Admitting: Emergency Medicine

## 2020-12-14 ENCOUNTER — Other Ambulatory Visit: Payer: Self-pay

## 2020-12-14 ENCOUNTER — Encounter (HOSPITAL_BASED_OUTPATIENT_CLINIC_OR_DEPARTMENT_OTHER): Payer: Self-pay

## 2020-12-14 DIAGNOSIS — R059 Cough, unspecified: Secondary | ICD-10-CM | POA: Diagnosis present

## 2020-12-14 DIAGNOSIS — Z20822 Contact with and (suspected) exposure to covid-19: Secondary | ICD-10-CM | POA: Diagnosis not present

## 2020-12-14 DIAGNOSIS — I129 Hypertensive chronic kidney disease with stage 1 through stage 4 chronic kidney disease, or unspecified chronic kidney disease: Secondary | ICD-10-CM | POA: Insufficient documentation

## 2020-12-14 DIAGNOSIS — N1831 Chronic kidney disease, stage 3a: Secondary | ICD-10-CM | POA: Diagnosis not present

## 2020-12-14 DIAGNOSIS — Z79899 Other long term (current) drug therapy: Secondary | ICD-10-CM | POA: Insufficient documentation

## 2020-12-14 DIAGNOSIS — Z87891 Personal history of nicotine dependence: Secondary | ICD-10-CM | POA: Insufficient documentation

## 2020-12-14 DIAGNOSIS — Z7982 Long term (current) use of aspirin: Secondary | ICD-10-CM | POA: Diagnosis not present

## 2020-12-14 DIAGNOSIS — J069 Acute upper respiratory infection, unspecified: Secondary | ICD-10-CM | POA: Diagnosis not present

## 2020-12-14 DIAGNOSIS — I2510 Atherosclerotic heart disease of native coronary artery without angina pectoris: Secondary | ICD-10-CM | POA: Diagnosis not present

## 2020-12-14 LAB — CBC WITH DIFFERENTIAL/PLATELET
Abs Immature Granulocytes: 0.01 10*3/uL (ref 0.00–0.07)
Basophils Absolute: 0 10*3/uL (ref 0.0–0.1)
Basophils Relative: 0 %
Eosinophils Absolute: 0.1 10*3/uL (ref 0.0–0.5)
Eosinophils Relative: 3 %
HCT: 32.7 % — ABNORMAL LOW (ref 39.0–52.0)
Hemoglobin: 10.8 g/dL — ABNORMAL LOW (ref 13.0–17.0)
Immature Granulocytes: 0 %
Lymphocytes Relative: 8 %
Lymphs Abs: 0.3 10*3/uL — ABNORMAL LOW (ref 0.7–4.0)
MCH: 30.5 pg (ref 26.0–34.0)
MCHC: 33 g/dL (ref 30.0–36.0)
MCV: 92.4 fL (ref 80.0–100.0)
Monocytes Absolute: 0.7 10*3/uL (ref 0.1–1.0)
Monocytes Relative: 21 %
Neutro Abs: 2.1 10*3/uL (ref 1.7–7.7)
Neutrophils Relative %: 68 %
Platelets: 164 10*3/uL (ref 150–400)
RBC: 3.54 MIL/uL — ABNORMAL LOW (ref 4.22–5.81)
RDW: 13.2 % (ref 11.5–15.5)
WBC: 3.1 10*3/uL — ABNORMAL LOW (ref 4.0–10.5)
nRBC: 0 % (ref 0.0–0.2)

## 2020-12-14 LAB — BASIC METABOLIC PANEL
Anion gap: 8 (ref 5–15)
BUN: 22 mg/dL (ref 8–23)
CO2: 24 mmol/L (ref 22–32)
Calcium: 8.4 mg/dL — ABNORMAL LOW (ref 8.9–10.3)
Chloride: 105 mmol/L (ref 98–111)
Creatinine, Ser: 1.36 mg/dL — ABNORMAL HIGH (ref 0.61–1.24)
GFR, Estimated: 56 mL/min — ABNORMAL LOW (ref >60)
Glucose, Bld: 105 mg/dL — ABNORMAL HIGH (ref 70–99)
Potassium: 4.2 mmol/L (ref 3.5–5.1)
Sodium: 137 mmol/L (ref 135–145)

## 2020-12-14 LAB — SARS CORONAVIRUS 2 (TAT 6-24 HRS): SARS Coronavirus 2: NEGATIVE

## 2020-12-14 NOTE — ED Notes (Signed)
Pt here with flu like symptoms for 6 days.  Low grade fever in evenings, chills, cough (dry), stuffy nose denies n/v. Took tylenol this am at 200

## 2020-12-14 NOTE — ED Triage Notes (Signed)
Pt states that he has been having symptoms of covid, reports coughing with low grade fever, and body aches X4-5 days states that his wife has similar symptoms but that she tested negative. Pt has had covid vaccine.

## 2020-12-14 NOTE — ED Provider Notes (Signed)
[facility] EMERGENCY DEPARTMENT Provider Note  CSN: [id number] Arrival date & time: 12/14/20 5621    History Chief Complaint  Patient presents with  . COVID SYMPTOMS    HPI  Noah Grant is a 70 y.o. male with history of multiple medical problems including RA on Enbrel, recently had his dose reduced due to leukopenia, reports 4-5 days of dry cough, low grade fever body aches and chills. His wife has been sick too and she tested negative for Covid earlier this week. He has not been getting better so decided to come to the ED for evaluation. No significant SOB and no known hypoxia at home. He has had covid vaccine and booster.   Past Medical History:  Diagnosis Date  . Abdominal aortic atherosclerosis (HCC)   . Anemia    likely of chronic disease  . Angina pectoris (HCC) 08/30/2020  . Arthritis   . Barrett's esophagus without dysplasia 03/25/2019   Formatting of this note might be different from the original. EGD done in 02/2019. Due in 3 years.  . Bipolar 1 disorder (HCC)   . Bipolar 1 disorder, mixed, moderate (HCC) 08/16/2014  . Bipolar affective disorder, currently depressed, moderate (HCC) 03/08/2020  . Bipolar depression (HCC) 08/16/2014  . BPH with urinary obstruction 12/29/2018  . Brain ischemia 03/08/2020  . Cardiac murmur 08/30/2020  . Chest pain 09/06/2020  . Chicken pox   . Chronic bronchitis (HCC)   . Cognitive complaints 03/08/2020  . Coronary artery disease involving native coronary artery of native heart with angina pectoris (HCC) 09/06/2020  . Depression   . Disc degeneration, 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  . GAD (generalized anxiety disorder) 11/02/2018  . Gastroesophageal reflux disease without esophagitis 08/16/2014   Formatting of this note might be different from the original. Last Assessment & Plan:  Well controlled. Continue current medications.  [name] Herpes gingivostomatitis 02/10/2015  . History of colon polyps 03/25/2019   Formatting of this note might be different  from the original. tubular adenoma  . History of stroke 11/10/2019  . Hyperlipidemia   . Hypertension   . Ingrowing nail 03/12/2020  . Labile hypertension 08/16/2014   Formatting of this note might be different from the original. Last Assessment & Plan:  Slightly above goal today secondary to pain. Will continue current regimen. Will check CMP today.  . 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 Concern over possible lichen planus. His dentist noticed lesions of his oral mucosa.  It was felt to be lichen planus.  He presents to discuss further.  Rarely does the area hurt.He smoked in the distant past EXAM shows several white patches on the buccal mucosa bilaterally.  No other concerning oral mucosal les  . Long-term use of immunosuppressant medication 03/23/2018  . Low testosterone 09/17/2016  . Midline thoracic back pain 02/10/2015  . Mixed hyperlipidemia 08/16/2014  . Normochromic normocytic anemia 08/16/2014  . PAD (peripheral artery disease) (HCC) 03/23/2019  . Pain of right hip joint 01/19/2016  . Rheumatic fever   . Rheumatoid arthritis (HCC) 10/29/2018   Formatting of this note might be different from the original. Rheum:  Dr. Sharmon Revere Formerly on MTX - stopped due to mouth sores. Currently started on Arava.  Also on 5 mg prednisone x 3 months during run-in.  . Right-sided chest wall pain 04/17/2019  . Seasonal allergies 03/23/2018  . Stage 3a chronic kidney disease (HCC) 07/21/2019  . Statin intolerance  07/31/2020  . Thoracic radiculopathy 06/16/2019  . Tremor 06/16/2019  . Vitamin B12 deficiency    Injections  . Vitamin D deficiency 09/17/2016    Past Surgical History:  Procedure Laterality Date  . CLAVICLE SURGERY     Right, Hardware placed  . CORONARY STENT INTERVENTION N/A 09/06/2020   Procedure: CORONARY STENT INTERVENTION;  Surgeon: Tonny Bollman, MD;  Location: [facility] INVASIVE CV LAB;  Service: Cardiovascular;   Laterality: N/A;  . LEFT HEART CATH AND CORONARY ANGIOGRAPHY N/A 09/06/2020   Procedure: LEFT HEART CATH AND CORONARY ANGIOGRAPHY;  Surgeon: Tonny Bollman, MD;  Location: [facility] INVASIVE CV LAB;  Service: Cardiovascular;  Laterality: N/A;  . WISDOM TOOTH EXTRACTION      Family History  Problem Relation Age of Onset  . Stroke Father 77       Deceased  . Hypertension Father   . Alcoholism Father   . Heart disease Maternal Grandfather 79       Deceased  . Alzheimer's disease Mother 57       Deceased in early 79s  . Heart disease Paternal Grandfather 62       Deceased  . Alcoholism Brother   . Hypertension Son     Social History   Tobacco Use  . Smoking status: Former Smoker    Types: Cigarettes  . Smokeless tobacco: Never Used  . Tobacco comment: Quit>40 years  Substance Use Topics  . Alcohol use: Not Currently    Alcohol/week: 0.0 standard drinks  . Drug use: Never     Home Medications Prior to Admission medications   Medication Sig Start Date End Date Taking? Authorizing Provider  amLODipine (NORVASC) 10 MG tablet TAKE ONE (1) TABLET BY MOUTH EVERY DAY 05/31/20   [name], Jilda Roche, DO  aspirin EC 81 MG tablet Take 81 mg by mouth daily.    [provider]  carbamazepine (TEGRETOL) 200 MG tablet TAKE ONE TABLET BY MOUTH THREE TIMES A DAY 10/02/20   Cottle, Steva Ready., MD  Cholecalciferol 50 MCG (2000 UT) CAPS Take 2,000 Units by mouth daily.     [provider]  clopidogrel (PLAVIX) 75 MG tablet Take 1 tablet (75 mg total) by mouth daily. 09/25/20 09/20/21  Revankar, Aundra Dubin, MD  Coenzyme Q10 (COQ10) 200 MG CAPS Take 200 mg by mouth daily.    [provider]  ezetimibe (ZETIA) 10 MG tablet Take 1 tablet (10 mg total) by mouth daily. 10/17/20 01/15/21  Revankar, Aundra Dubin, MD  hydrALAZINE (APRESOLINE) 50 MG tablet Take 50 mg by mouth 3 (three) times daily.    [provider]  labetalol (NORMODYNE) 100 MG tablet Take 1 tablet (100 mg total)  by mouth 2 (two) times daily. 07/31/20   Sharlene Dory, DO  leflunomide (ARAVA) 20 MG tablet Take 20 mg by mouth daily.    [provider]  lithium carbonate 150 MG capsule TAKE THREE CAPSULES BY MOUTH EVERY NIGHT AT BEDTIME 11/15/20   Cottle, Steva Ready., MD  losartan (COZAAR) 100 MG tablet Take 100 mg by mouth daily.    [provider]  Lysine HCl 1000 MG TABS Take 1,000 mg by mouth 2 (two) times daily.     [provider]  nitroGLYCERIN (NITROSTAT) 0.4 MG SL tablet Place 1 tablet (0.4 mg total) under the tongue every 5 (five) minutes as needed. 08/30/20 11/28/20  Revankar, Aundra Dubin, MD  pantoprazole (PROTONIX) 40 MG tablet Take 1 tablet (40 mg total) by mouth daily. 10/03/20  [name], DO  simvastatin (ZOCOR) 5 MG tablet Take 1 tablet (5 mg total) by mouth daily. 10/03/20   [name], DO  tamsulosin (FLOMAX) 0.4 MG CAPS capsule Take 1 capsule (0.4 mg total) by mouth daily. 10/03/20   [name], DO     Allergies    Hydroxychloroquine, Methotrexate, Nsaids, Lovastatin, Pravastatin, and Rosuvastatin   Review of Systems   Review of Systems A comprehensive review of systems was completed and negative except as noted in HPI.    Physical Exam BP (!) 145/64 (BP Location: Right Arm)   Pulse (!) 59   Temp 98.6 F (37 C) (Oral)   Resp 17   Ht 5\' 7"  (1.702 m)   Wt 72.6 kg   SpO2 98%   BMI 25.06 kg/m   Physical Exam Vitals and nursing note reviewed.  Constitutional:      Appearance: Normal appearance.  HENT:     Head: Normocephalic and atraumatic.     Nose: Nose normal.     Mouth/Throat:     Mouth: Mucous membranes are moist.  Eyes:     Extraocular Movements: Extraocular movements intact.     Conjunctiva/sclera: Conjunctivae normal.  Cardiovascular:     Rate and Rhythm: Normal rate.  Pulmonary:     Effort: Pulmonary effort is normal.     Breath sounds: Normal breath sounds.  Abdominal:     General:  Abdomen is flat.     Palpations: Abdomen is soft.     Tenderness: There is no abdominal tenderness.  Musculoskeletal:        General: No swelling. Normal range of motion.     Cervical back: Neck supple.  Skin:    General: Skin is warm and dry.  Neurological:     General: No focal deficit present.     Mental Status: He is alert.  Psychiatric:        Mood and Affect: Mood normal.      ED Results / Procedures / Treatments   Labs (all labs ordered are listed, but only abnormal results are displayed) Labs Reviewed  BASIC METABOLIC PANEL - Abnormal; Notable for the following components:      Result Value   Glucose, Bld 105 (*)    Creatinine, Ser 1.36 (*)    Calcium 8.4 (*)    GFR, Estimated 56 (*)    All other components within normal limits  CBC WITH DIFFERENTIAL/PLATELET - Abnormal; Notable for the following components:   WBC 3.1 (*)    RBC 3.54 (*)    Hemoglobin 10.8 (*)    HCT 32.7 (*)    Lymphs Abs 0.3 (*)    All other components within normal limits  SARS CORONAVIRUS 2 (TAT 6-24 HRS)    EKG None   Radiology DG Chest Port 1 View  Result Date: 12/14/2020 CLINICAL DATA:  Cough and fever EXAM: PORTABLE CHEST 1 VIEW COMPARISON:  04/16/2019 FINDINGS: Cardiac shadows within normal limits. The lungs are well aerated bilaterally. Prior right clavicular operative fixation is seen. Old right rib fractures are noted. No focal infiltrate or sizable effusion is noted. IMPRESSION: No acute abnormality noted. Electronically Signed   By: Inez Catalina M.D.   On: 12/14/2020 10:31    Procedures Procedures  Medications Ordered in the ED Medications - No data to display   MDM Rules/Calculators/A&P MDM  ED Course  I have reviewed the triage vital signs and the nursing notes.  Pertinent labs & imaging results that were  available during my care of the patient were reviewed by me and considered in my medical decision making (see chart for details).  Clinical Course as of 12/14/20  1142  Thu Dec 14, 2020  1006 Patient with viral URI symptoms, could be Covid but he is non-toxic appearing with benign exam and no hypoxia on Room Air. Will check basic labs due to his recent reported leukopenia and a chest xray. Covid test ordered, patient understands this will not result during his ED stay today.  [CS]  1043 CXR is clear.  [CS]  1046 CBC with mild leukopenia and anemia. Hgb is at baseline, previous WBC was 4.0 earlier in December. Will advise he discuss with his rheumatologist if persists when his current illness is improved.  [CS]  1117 BMP unremarkable.  [CS]  1139 Patient remains well appearing without hypoxia. Plan discharge home pending Covid test. No indication for antibiotics. Recommend quarantine pending Covid results and PCP follow up if not improving.  [CS]    Clinical Course User Index [CS] Pollyann Savoy, MD    Final Clinical Impression(s) / ED Diagnoses Final diagnoses:  Viral URI with cough    Rx / DC Orders ED Discharge Orders    None       Pollyann Savoy, MD 12/14/20 1142

## 2020-12-17 ENCOUNTER — Other Ambulatory Visit: Payer: Self-pay | Admitting: Psychiatry

## 2020-12-17 DIAGNOSIS — F3132 Bipolar disorder, current episode depressed, moderate: Secondary | ICD-10-CM

## 2020-12-18 ENCOUNTER — Telehealth: Payer: Self-pay

## 2020-12-18 NOTE — Telephone Encounter (Signed)
PA started on CMM. Key: [id number]

## 2021-01-01 ENCOUNTER — Ambulatory Visit (INDEPENDENT_AMBULATORY_CARE_PROVIDER_SITE_OTHER): Payer: HMO | Admitting: Family Medicine

## 2021-01-01 ENCOUNTER — Encounter: Payer: Self-pay | Admitting: Family Medicine

## 2021-01-01 ENCOUNTER — Other Ambulatory Visit: Payer: Self-pay

## 2021-01-01 VITALS — BP 118/62 | HR 53 | Temp 98.0°F | Ht 67.0 in | Wt 161.1 lb

## 2021-01-01 DIAGNOSIS — I10 Essential (primary) hypertension: Secondary | ICD-10-CM

## 2021-01-01 DIAGNOSIS — M549 Dorsalgia, unspecified: Secondary | ICD-10-CM

## 2021-01-01 DIAGNOSIS — I700 Atherosclerosis of aorta: Secondary | ICD-10-CM

## 2021-01-01 DIAGNOSIS — K219 Gastro-esophageal reflux disease without esophagitis: Secondary | ICD-10-CM

## 2021-01-01 MED ORDER — SIMVASTATIN 5 MG PO TABS: 5.0000 mg | ORAL_TABLET | Freq: Every day | ORAL | 3 refills | Status: DC

## 2021-01-01 MED ORDER — LEFLUNOMIDE 20 MG PO TABS: ORAL_TABLET | ORAL | Status: DC

## 2021-01-01 MED ORDER — AMLODIPINE BESYLATE 10 MG PO TABS: ORAL_TABLET | ORAL | 2 refills | Status: DC

## 2021-01-01 NOTE — Progress Notes (Signed)
Chief Complaint  Patient presents with  . Follow-up    Subjective Noah Grant is a 70 y.o. male who presents for hypertension follow up. He does monitor home blood pressures. Blood pressures ranging from 110's/60-70's on average. He is compliant with medications- losartan 100 mg/d, Norvasc 10 mg/d, hydralazine 50 mg tid, labetalol 100 mg bid. Patient has these side effects of medication: none He is adhering to a healthy diet overall. Current exercise: some walking  GERD Taking Protonix 40 mg/d. No ae's, reports compliance. Works well.   Fall 2 weeks ago slipped and fell on R side. BEtter overall but R upper back area not improving.  No bruising, redness, or swelling.  Range of motion is unaffected, though he does have chronic restriction due to rheumatoid arthritis.  He has been using Tylenol at home.  No shortness of breath.   Past Medical History:  Diagnosis Date  . Abdominal aortic atherosclerosis (HCC)   . Anemia    likely of chronic disease  . Angina pectoris (HCC) 08/30/2020  . Arthritis   . Barrett's esophagus without dysplasia 03/25/2019   Formatting of this note might be different from the original. EGD done in 02/2019. Due in 3 years.  . Bipolar 1 disorder (HCC)   . Bipolar 1 disorder, mixed, moderate (HCC) 08/16/2014  . Bipolar affective disorder, currently depressed, moderate (HCC) 03/08/2020  . Bipolar depression (HCC) 08/16/2014  . BPH with urinary obstruction 12/29/2018  . Brain ischemia 03/08/2020  . Cardiac murmur 08/30/2020  . Chest pain 09/06/2020  . Chicken pox   . Chronic bronchitis (HCC)   . Cognitive complaints 03/08/2020  . Coronary artery disease involving native coronary artery of native heart with angina pectoris (HCC) 09/06/2020  . Depression   . Disc degeneration, 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  . GAD (generalized anxiety disorder) 11/02/2018  . Gastroesophageal reflux disease without esophagitis 08/16/2014   Formatting of this note might be different from the  original. Last Assessment & Plan:  Well controlled. Continue current medications.  [name] Herpes gingivostomatitis 02/10/2015  . History of colon polyps 03/25/2019   Formatting of this note might be different from the original. tubular adenoma  . History of stroke 11/10/2019  . Hyperlipidemia   . Hypertension   . Ingrowing nail 03/12/2020  . Labile hypertension 08/16/2014   Formatting of this note might be different from the original. Last Assessment & Plan:  Slightly above goal today secondary to pain. Will continue current regimen. Will check CMP today.  . 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 Concern over possible lichen planus. His dentist noticed lesions of his oral mucosa.  It was felt to be lichen planus.  He presents to discuss further.  Rarely does the area hurt.He smoked in the distant past EXAM shows several white patches on the buccal mucosa bilaterally.  No other concerning oral mucosal les  . Long-term use of immunosuppressant medication 03/23/2018  . Low testosterone 09/17/2016  . Midline thoracic back pain 02/10/2015  . Mixed hyperlipidemia 08/16/2014  . Normochromic normocytic anemia 08/16/2014  . PAD (peripheral artery disease) (HCC) 03/23/2019  . Pain of right hip joint 01/19/2016  . Rheumatic fever   . Rheumatoid arthritis (HCC) 10/29/2018   Formatting of this note might be different from the original. Rheum:  Dr. Sharmon Revere Formerly on MTX - stopped due to mouth sores. Currently started on Arava.  Also on 5 mg prednisone x 3 months during  run-in.  . Right-sided chest wall pain 04/17/2019  . Seasonal allergies 03/23/2018  . Stage 3a chronic kidney disease (HCC) 07/21/2019  . Statin intolerance 07/31/2020  . Thoracic radiculopathy 06/16/2019  . Tremor 06/16/2019  . Vitamin B12 deficiency    Injections  . Vitamin D deficiency 09/17/2016    Exam BP 118/62 (BP Location: Left Arm, Patient Position: Sitting, Cuff Size:  Normal)   Pulse (!) 53   Temp 98 F (36.7 C) (Oral)   Ht 5\' 7"  (1.702 m)   Wt 161 lb 2 oz (73.1 kg)   SpO2 99%   BMI 25.24 kg/m  General:  well developed, well nourished, in no apparent distress Heart: RRR, no bruits, no LE edema Lungs: clear to auscultation, no accessory muscle use MSK: Tender to palpation along the rhomboid near rib 5/6; there is no fluctuance, erythema, ecchymosis or excessive warmth Neuro: DTRs equal and symmetric in the upper extremities without clonus Psych: well oriented with normal range of affect and appropriate judgment/insight  Essential hypertension  Gastroesophageal reflux disease, unspecified whether esophagitis present  Upper back pain on right side  Abdominal aortic atherosclerosis (HCC) - Plan: simvastatin (ZOCOR) 5 MG tablet  1.  Continue losartan 100 mg daily, hydralazine 50 mg 3 times daily, labetalol 100 mg twice daily, Norvasc 10 mg daily.  Counseled on diet and exercise. 2.  Continue Protonix 40 mg daily. 3.  Heat, ice, Tylenol.  Stretches and exercises for thoracic spine provided.  Physical therapy if no improvement. 4.  He is doing well with a low-dose of a statin.  He has a strong history of statin intolerance. F/u in 6 months for physical. The patient voiced understanding and agreement to the plan.  [place], DO 01/01/21  10:49 AM

## 2021-01-01 NOTE — Addendum Note (Signed)
Addended by: Scharlene Gloss B on: 01/01/2021 01:01 PM   Modules accepted: Orders

## 2021-01-01 NOTE — Patient Instructions (Signed)
Keep the diet clean and stay active.  Heat (pad or rice pillow in microwave) over affected area, 10-15 minutes twice daily.   Ice/cold pack over area for 10-15 min twice daily.  OK to take Tylenol 1000 mg (2 extra strength tabs) or 975 mg (3 regular strength tabs) every 6 hours as needed.  See if your psychiatrist agrees that you can come off of the carbamazepine.   Let us know if you need anything.   Mid-Back Strain Rehab It is normal to feel mild stretching, pulling, tightness, or discomfort as you do these exercises, but you should stop right away if you feel sudden pain or your pain gets worse.   Stretching and range of motion exercises This exercise warms up your muscles and joints and improves the movement and flexibility of your back and shoulders. This exercise also help to relieve pain. Exercise A: Chest and spine stretch    1. Lie down on your back on a firm surface. 2. Roll a towel or a small blanket so it is about 4 inches (10 cm) in diameter. 3. Put the towel lengthwise under the middle of your back so it is under your spine, but not under your shoulder blades. 4. To increase the stretch, you may put your hands behind your head and let your elbows fall to your sides. 5. Hold for 30 seconds. Repeat exercise 2 times. Complete this exercise 3 times per week.  Strengthening exercises These exercises build strength and endurance in your back and your shoulder blade muscles. Endurance is the ability to use your muscles for a long time, even after they get tired. Exercise B: Alternating arm and leg raises    1. Get on your hands and knees on a firm surface. If you are on a hard floor, you may want to use padding to cushion your knees, such as an exercise mat. 2. Line up your arms and legs. Your hands should be below your shoulders, and your knees should be below your hips. 3. Lift your left leg behind you. At the same time, raise your right arm and straighten it in front of  you. ? Do not lift your leg higher than your hip. ? Do not lift your arm higher than your shoulder. ? Keep your abdominal and back muscles tight. ? Keep your hips facing the ground. ? Do not arch your back. ? Keep your balance carefully, and do not hold your breath. 4. Hold for 3 seconds. 5. Slowly return to the starting position and repeat with your right leg and your left arm. Repeat 2 times. Complete this exercise 3 times per week. Exercise C: Straight arm rows (shoulder extension)     1. Stand with your feet shoulder width apart. 2. Secure an exercise band to a stable object in front of you so the band is at or above shoulder height. 3. Hold one end of the exercise band in each hand. 4. Straighten your elbows and lift your hands up to shoulder height. 5. Step back, away from the secured end of the exercise band, until the band stretches. 6. Squeeze your shoulder blades together and pull your hands down to the sides of your thighs. Stop when your hands are straight down by your sides. Do not let your hands go behind your body. 7. Hold for 3 seconds. 8. Slowly return to the starting position. Repeat 2 times. Complete this exercise 3 times per week. Exercise D: Shoulder external rotation, prone 1. Lie on  your abdomen on a firm bed so your left / right forearm hangs over the edge of the bed and your upper arm is on the bed, straight out from your body. ? Your elbow should be bent. ? Your palm should be facing your feet. 2. If instructed, hold a 5 lb weight in your hand. 3. Squeeze your shoulder blade toward the middle of your back. Do not let your shoulder lift toward your ear. 4. Keep your elbow bent in an "L" shape (90 degrees) while you slowly move your forearm up toward the ceiling. Move your forearm up to the height of the bed, toward your head. ? Your upper arm should not move. ? At the top of the movement, your palm should face the floor. 5. Hold for 3 seconds. 6. Slowly return  to the starting position and relax your muscles. Repeat 3 times. Complete this exercise 3 times per week. Exercise E: Scapular retraction and external rotation, rowing    1. Sit in a stable chair without armrests, or stand. 2. Secure an exercise band to a stable object in front of you so it is at shoulder height. 3. Hold one end of the exercise band in each hand. 4. Bring your arms out straight in front of you. 5. Step back, away from the secured end of the exercise band, until the band stretches. 6. Pull the band backward. As you do this, bend your elbows and squeeze your shoulder blades together, but avoid letting the rest of your body move. Do not let your shoulders lift up toward your ears. 7. Stop when your elbows are at your sides or slightly behind your body. 8. Hold for 5 seconds. 9. Slowly straighten your arms to return to the starting position. Repeat 2 times. Complete this exercise 3 times per week. Posture and body mechanics    Body mechanics refers to the movements and positions of your body while you do your daily activities. Posture is part of body mechanics. Good posture and healthy body mechanics can help to relieve stress in your body's tissues and joints. Good posture means that your spine is in its natural S-curve position (your spine is neutral), your shoulders are pulled back slightly, and your head is not tipped forward. The following are general guidelines for applying improved posture and body mechanics to your everyday activities. Standing     When standing, keep your spine neutral and your feet about hip-width apart. Keep a slight bend in your knees. Your ears, shoulders, and hips should line up.  When you do a task in which you lean forward while standing in one place for a long time, place one foot up on a stable object that is 2-4 inches (5-10 cm) high, such as a footstool. This helps keep your spine neutral. Sitting     When sitting, keep your spine  neutral and keep your feet flat on the floor. Use a footrest, if necessary, and keep your thighs parallel to the floor. Avoid rounding your shoulders, and avoid tilting your head forward.  When working at a desk or a computer, keep your desk at a height where your hands are slightly lower than your elbows. Slide your chair under your desk so you are close enough to maintain good posture.  When working at a computer, place your monitor at a height where you are looking straight ahead and you do not have to tilt your head forward or downward to look at the screen. Resting  When lying down and resting, avoid positions that are most painful for you.  If you have pain with activities such as sitting, bending, stooping, or squatting (flexion-based activities), lie in a position in which your body does not bend very much. For example, avoid curling up on your side with your arms and knees near your chest (fetal position).  If you have pain with activities such as standing for a long time or reaching with your arms (extension-based activities), lie with your spine in a neutral position and bend your knees slightly. Try the following positions:  Lying on your side with a pillow between your knees.  Lying on your back with a pillow under your knees.   Lifting     When lifting objects, keep your feet at least shoulder-width apart and tighten your abdominal muscles.  Bend your knees and hips and keep your spine neutral. It is important to lift using the strength of your legs, not your back. Do not lock your knees straight out.  Always ask for help to lift heavy or awkward objects. Make sure you discuss any questions you have with your health care provider.

## 2021-01-08 NOTE — Telephone Encounter (Signed)
CMM states not a PA client please call the number at the back of the patient's ID card for assistance.

## 2021-01-10 ENCOUNTER — Encounter: Payer: Self-pay | Admitting: Family Medicine

## 2021-01-10 ENCOUNTER — Ambulatory Visit (INDEPENDENT_AMBULATORY_CARE_PROVIDER_SITE_OTHER): Payer: HMO | Admitting: Family Medicine

## 2021-01-10 ENCOUNTER — Other Ambulatory Visit: Payer: Self-pay

## 2021-01-10 VITALS — BP 142/68 | HR 63 | Temp 99.1°F | Ht 67.0 in | Wt 163.1 lb

## 2021-01-10 DIAGNOSIS — M7501 Adhesive capsulitis of right shoulder: Secondary | ICD-10-CM

## 2021-01-10 DIAGNOSIS — M7918 Myalgia, other site: Secondary | ICD-10-CM

## 2021-01-10 DIAGNOSIS — M549 Dorsalgia, unspecified: Secondary | ICD-10-CM

## 2021-01-10 MED ORDER — METHYLPREDNISOLONE ACETATE 40 MG/ML IJ SUSP
40.0000 mg | Freq: Once | INTRAMUSCULAR | Status: AC
  Administered 2021-01-10: 40 mg via INTRA_ARTICULAR

## 2021-01-10 MED ORDER — METHYLPREDNISOLONE ACETATE 40 MG/ML IJ SUSP
20.0000 mg | Freq: Once | INTRAMUSCULAR | Status: AC
  Administered 2021-01-10: 20 mg via INTRA_ARTICULAR

## 2021-01-10 NOTE — Addendum Note (Signed)
Addended by: Scharlene Gloss B on: 01/10/2021 03:15 PM   Modules accepted: Orders

## 2021-01-10 NOTE — Progress Notes (Signed)
Chief Complaint  Patient presents with  . Shoulder Pain    Subjective: Patient is a 70 y.o. male here for f/u.  Patient was seen around 1 week ago for right upper back pain.  It is gotten worse since then.  He was compliant with the heat, ice, and exercise exercises.  When he called in his fingers company for physical therapy, they told that he needed to return here and have an x-ray.  Patient has a history of rheumatoid arthritis and decreased range of motion of his right shoulder.  No recent injury or change in activity.  No new symptoms regarding this.    Past Medical History:  Diagnosis Date  . Abdominal aortic atherosclerosis (HCC)   . Anemia    likely of chronic disease  . Angina pectoris (HCC) 08/30/2020  . Arthritis   . Barrett's esophagus without dysplasia 03/25/2019   Formatting of this note might be different from the original. EGD done in 02/2019. Due in 3 years.  . Bipolar 1 disorder (HCC)   . Bipolar 1 disorder, mixed, moderate (HCC) 08/16/2014  . Bipolar affective disorder, currently depressed, moderate (HCC) 03/08/2020  . Bipolar depression (HCC) 08/16/2014  . BPH with urinary obstruction 12/29/2018  . Brain ischemia 03/08/2020  . Cardiac murmur 08/30/2020  . Chest pain 09/06/2020  . Chicken pox   . Chronic bronchitis (HCC)   . Cognitive complaints 03/08/2020  . Coronary artery disease involving native coronary artery of native heart with angina pectoris (HCC) 09/06/2020  . Depression   . Disc degeneration, 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  . GAD (generalized anxiety disorder) 11/02/2018  . Gastroesophageal reflux disease without esophagitis 08/16/2014   Formatting of this note might be different from the original. Last Assessment & Plan:  Well controlled. Continue current medications.  [name] Herpes gingivostomatitis 02/10/2015  . History of colon polyps 03/25/2019   Formatting of this note might be different from the original. tubular adenoma  . History of stroke 11/10/2019  . Hyperlipidemia    . Hypertension   . Ingrowing nail 03/12/2020  . Labile hypertension 08/16/2014   Formatting of this note might be different from the original. Last Assessment & Plan:  Slightly above goal today secondary to pain. Will continue current regimen. Will check CMP today.  . 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 Concern over possible lichen planus. His dentist noticed lesions of his oral mucosa.  It was felt to be lichen planus.  He presents to discuss further.  Rarely does the area hurt.He smoked in the distant past EXAM shows several white patches on the buccal mucosa bilaterally.  No other concerning oral mucosal les  . Long-term use of immunosuppressant medication 03/23/2018  . Low testosterone 09/17/2016  . Midline thoracic back pain 02/10/2015  . Mixed hyperlipidemia 08/16/2014  . Normochromic normocytic anemia 08/16/2014  . PAD (peripheral artery disease) (HCC) 03/23/2019  . Pain of right hip joint 01/19/2016  . Rheumatic fever   . Rheumatoid arthritis (HCC) 10/29/2018   Formatting of this note might be different from the original. Rheum:  Dr. Sharmon Revere Formerly on MTX - stopped due to mouth sores. Currently started on Arava.  Also on 5 mg prednisone x 3 months during run-in.  . Right-sided chest wall pain 04/17/2019  . Seasonal allergies 03/23/2018  . Stage 3a chronic kidney disease (HCC) 07/21/2019  . Statin intolerance 07/31/2020  . Thoracic radiculopathy 06/16/2019  . Tremor 06/16/2019  .  Vitamin B12 deficiency    Injections  . Vitamin D deficiency 09/17/2016    Objective: BP (!) 142/68 (BP Location: Left Arm, Patient Position: Sitting, Cuff Size: Normal)   Pulse 63   Temp 99.1 F (37.3 C) (Oral)   Ht 5\' 7"  (1.702 m)   Wt 163 lb 2 oz (74 kg)   SpO2 99%   BMI 25.55 kg/m  General: Awake, appears stated age MSK: Tender to palpation over the right upper rhomboid region with a specific tender point; the right shoulder has  decreased active and passive range of motion with forward flexion and abduction. Lungs: No accessory muscle use Psych: Age appropriate judgment and insight, normal affect and mood  Procedure note; trigger point injection Verbal consent obtained. The areas of interest was marked with an otoscope speculum There were then cleaned with alcohol. 20 mg Depo and 1 cc 1% lido w/o epi was injected w 30 g needle. The area was then bandaged. There were no complications noted. The patient tolerated the procedure well.  Procedure Note; Shoulder joint injection Informed consent obtained. The area was palpated, an area was marked posterior to the acromion process approximately 2 cm inferiorly, and cleaned with Betadine x1. A 27-gauge needle, while aiming towards the coracoid process, was used to enter the joint posteriorally with ease. 40 mg of Depo with 2 mL of 1% lidocaine was injected. The patient tolerated the procedure well. There were no complications noted.  Assessment and Plan: Upper back pain on right side - Plan: Inject trigger point, 1 or 2, Ambulatory referral to Physical Therapy  Rhomboid muscle pain - Plan: Ambulatory referral to Physical Therapy  Adhesive capsulitis of right shoulder - Plan: PR DRAIN/INJECT LARGE JOINT/BURSA  1/2- Trigger pt injection. Refer PT.  3- Jt injection. PT. Fu prn.  The patient voiced understanding and agreement to the plan.  [place], DO 01/10/21  3:03 PM

## 2021-01-10 NOTE — Patient Instructions (Signed)
If you do not hear anything about your referral in the next 1-2 weeks, call our office and ask for an update.  Ice/cold pack over area for 10-15 min twice daily.  Heat (pad or rice pillow in microwave) over affected area, 10-15 minutes twice daily.   OK to take Tylenol 1000 mg (2 extra strength tabs) or 975 mg (3 regular strength tabs) every 6 hours as needed.  Continue the stretches/exercises.  Let us know if you need anything.

## 2021-01-11 ENCOUNTER — Telehealth: Payer: Self-pay | Admitting: Family Medicine

## 2021-01-11 NOTE — Telephone Encounter (Signed)
[name] from [facility] [phone]- would like a verbal consent stating that [name] has a chronic heart disease or diabetic so he can participate in their program   Please advice

## 2021-01-11 NOTE — Telephone Encounter (Signed)
Ok

## 2021-01-11 NOTE — Telephone Encounter (Signed)
V/o given 

## 2021-01-16 ENCOUNTER — Other Ambulatory Visit: Payer: Self-pay | Admitting: Family Medicine

## 2021-01-16 DIAGNOSIS — R351 Nocturia: Secondary | ICD-10-CM

## 2021-01-21 ENCOUNTER — Other Ambulatory Visit: Payer: Self-pay | Admitting: Family Medicine

## 2021-01-21 DIAGNOSIS — I700 Atherosclerosis of aorta: Secondary | ICD-10-CM

## 2021-02-12 ENCOUNTER — Encounter: Payer: Self-pay | Admitting: Psychiatry

## 2021-02-12 ENCOUNTER — Other Ambulatory Visit: Payer: Self-pay

## 2021-02-12 ENCOUNTER — Ambulatory Visit (INDEPENDENT_AMBULATORY_CARE_PROVIDER_SITE_OTHER): Payer: HMO | Admitting: Psychiatry

## 2021-02-12 DIAGNOSIS — F3132 Bipolar disorder, current episode depressed, moderate: Secondary | ICD-10-CM | POA: Diagnosis not present

## 2021-02-12 DIAGNOSIS — F411 Generalized anxiety disorder: Secondary | ICD-10-CM | POA: Diagnosis not present

## 2021-02-12 DIAGNOSIS — Z79899 Other long term (current) drug therapy: Secondary | ICD-10-CM

## 2021-02-12 NOTE — Progress Notes (Signed)
Noah Grant [id number] [birth year] 70 y.o.  Subjective:   Patient ID:  Noah Grant is a 70 y.o. (DOB [birth year]) male.  Chief Complaint:  Chief Complaint  Patient presents with  . Bipolar disorder with moderate depression (HCC)  . Follow-up    Depression        Associated symptoms include no decreased concentration and no suicidal ideas.  Past medical history includes anxiety.   Anxiety Symptoms include nervous/anxious behavior. Patient reports no confusion, decreased concentration or suicidal ideas.    Medication Refill Associated symptoms include arthralgias and weakness.   Noah Grant presents to the office today for follow-up of bipolar, GAD, poor STM.  seen May 18, 2019 and 11/17/2019 without med changes.    05/17/2020 appointment with the following noted: Word recall problems with neuropsych testing by Dr. Orie Fisherman inconclusive results.  Test reviewed. Still tremor can interfere with eating. Nocturia Dt overactive bladder. Overall mentally ok but too many medical concerns with RA and muscle pain.   Chronic pain issues ongoing.   Htn controlled.  Plan: Lithium level from January 2021 0.8 on 600 mg daily. Gradually it's crept up.  Disc in detail it's relation to renal function and Cr has increased gradually. BC tremor & jerks reduce to 450 mg daily.  02/12/2021 appointment with the following noted: Reduced ithium with no change in tremor. End of December, CVD stent placement and reaction to contrast dye.   Cardiologists ask if he can come off CBZ.   In the last year has felet mentally better than ever.    Depression is somewhat chronic and waxes and wanes dependent on health issues.  Avoids stressors if possible.  Satisfied with psych meds. He's tried a lot of different meds as noted and doesn't want more changes.  Patient reports stable mood and denie irritable moods except as noted.  Patient denies any recent difficulty with anxiety except situational.   Patient denies unusual difficulty with sleep initiation or maintenance except awakens with pain chronically. Denies appetite disturbance.  Patient reports that energy and motivation have been good.  Patient denies any difficulty with concentration  But history of word finding from ministrokes..  Patient denies any suicidal ideation.  Has seen nephorologists and rheumatologists and neurologist, [facility].   Lives in [place] now  Previous psych med trials are extensive and include meds for anxiety and mood.  These include clonidine, buspirone, Abilify 5 mg, temazepam, olanzapine, bupropion, Strattera, Depakote, Prozac, Ritalin, Serzone 700 mg daily, Provigil, sertraline, Geodon, lamotrigine, mirtazapine, Pamelor, amitriptyline 450 mg/day, duloxetine, Pristiq which caused rage, Cerefolin NAC, Ambien, ProSom, trazodone which was ineffective.  Review of Systems:  Review of Systems  Musculoskeletal: Positive for arthralgias and gait problem.  Neurological: Positive for tremors and weakness.       Occ jerks  Psychiatric/Behavioral: Positive for depression and dysphoric mood. Negative for agitation, behavioral problems, confusion, decreased concentration, hallucinations, self-injury, sleep disturbance and suicidal ideas. The patient is nervous/anxious. The patient is not hyperactive.     Medications: I have reviewed the patient's current medications.  Current Outpatient Medications  Medication Sig Dispense Refill  . amLODipine (NORVASC) 10 MG tablet TAKE ONE (1) TABLET BY MOUTH EVERY DAY 90 tablet 2  . aspirin EC 81 MG tablet Take 81 mg by mouth daily.    . carbamazepine (TEGRETOL) 200 MG tablet TAKE ONE TABLET BY MOUTH THREE TIMES A DAY 270 tablet 1  . Cholecalciferol 50 MCG (2000 UT) CAPS Take 2,000 Units by  mouth daily.     . clopidogrel (PLAVIX) 75 MG tablet Take 1 tablet (75 mg total) by mouth daily. 90 tablet 3  . Coenzyme Q10 (COQ10) 200 MG CAPS Take 200 mg by mouth daily.    [name]  ezetimibe (ZETIA) 10 MG tablet Take 10 mg by mouth daily.    . hydrALAZINE (APRESOLINE) 50 MG tablet Take 50 mg by mouth 3 (three) times daily.    [name] labetalol (NORMODYNE) 100 MG tablet Take 1 tablet (100 mg total) by mouth 2 (two) times daily. 180 tablet 2  . leflunomide (ARAVA) 20 MG tablet Take 1 tab 5 days weekly.    [name] lithium carbonate 150 MG capsule TAKE THREE CAPSULES BY MOUTH EVERY NIGHT AT BEDTIME 270 capsule 1  . losartan (COZAAR) 100 MG tablet Take 100 mg by mouth daily.    [name] Lysine HCl 1000 MG TABS Take 1,000 mg by mouth 2 (two) times daily.     . nitroGLYCERIN (NITROSTAT) 0.4 MG SL tablet Place 0.4 mg under the tongue every 5 (five) minutes as needed for chest pain.    . pantoprazole (PROTONIX) 40 MG tablet Take 1 tablet (40 mg total) by mouth daily. 90 tablet 2  . simvastatin (ZOCOR) 5 MG tablet TAKE ONE TABLET BY MOUTH DAILY 30 tablet 1  . tamsulosin (FLOMAX) 0.4 MG CAPS capsule TAKE ONE CAPSULE BY MOUTH DAILY 30 capsule 3   No current facility-administered medications for this visit.    Medication Side Effects: None  Allergies:  Allergies  Allergen Reactions  . Hydroxychloroquine Other (See Comments)    Terrible Nightmares  . Methotrexate Dermatitis    Developed Mouth Sores  . Nsaids Dermatitis    Developed Mouth Blisters  . Lovastatin Other (See Comments)    Caused abdominal pain that radiated to the back  . Pravastatin Other (See Comments)    Caused abdominal pain that radiated to the back  . Rosuvastatin Other (See Comments)    Caused abdominal pain that radiated to the back    Past Medical History:  Diagnosis Date  . Abdominal aortic atherosclerosis (HCC)   . Anemia    likely of chronic disease  . Angina pectoris (HCC) 08/30/2020  . Arthritis   . Barrett's esophagus without dysplasia 03/25/2019   Formatting of this note might be different from the original. EGD done in 02/2019. Due in 3 years.  . Bipolar 1 disorder (HCC)   . Bipolar 1 disorder, mixed,  moderate (HCC) 08/16/2014  . Bipolar affective disorder, currently depressed, moderate (HCC) 03/08/2020  . Bipolar depression (HCC) 08/16/2014  . BPH with urinary obstruction 12/29/2018  . Brain ischemia 03/08/2020  . Cardiac murmur 08/30/2020  . Chest pain 09/06/2020  . Chicken pox   . Chronic bronchitis (HCC)   . Cognitive complaints 03/08/2020  . Coronary artery disease involving native coronary artery of native heart with angina pectoris (HCC) 09/06/2020  . Depression   . Disc degeneration, 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  . GAD (generalized anxiety disorder) 11/02/2018  . Gastroesophageal reflux disease without esophagitis 08/16/2014   Formatting of this note might be different from the original. Last Assessment & Plan:  Well controlled. Continue current medications.  [name] Herpes gingivostomatitis 02/10/2015  . History of colon polyps 03/25/2019   Formatting of this note might be different from the original. tubular adenoma  . History of stroke 11/10/2019  . Hyperlipidemia   . Hypertension   . Ingrowing nail 03/12/2020  . Labile hypertension 08/16/2014   Formatting of this note  might be different from the original. Last Assessment & Plan:  Slightly above goal today secondary to pain. Will continue current regimen. Will check CMP today.  . 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 Concern over possible lichen planus. His dentist noticed lesions of his oral mucosa.  It was felt to be lichen planus.  He presents to discuss further.  Rarely does the area hurt.He smoked in the distant past EXAM shows several white patches on the buccal mucosa bilaterally.  No other concerning oral mucosal les  . Long-term use of immunosuppressant medication 03/23/2018  . Low testosterone 09/17/2016  . Midline thoracic back pain 02/10/2015  . Mixed hyperlipidemia 08/16/2014  . Normochromic normocytic anemia 08/16/2014  . PAD (peripheral artery disease) (HCC) 03/23/2019  . Pain  of right hip joint 01/19/2016  . Rheumatic fever   . Rheumatoid arthritis (HCC) 10/29/2018   Formatting of this note might be different from the original. Rheum:  Dr. Sharmon Revere Formerly on MTX - stopped due to mouth sores. Currently started on Arava.  Also on 5 mg prednisone x 3 months during run-in.  . Right-sided chest wall pain 04/17/2019  . Seasonal allergies 03/23/2018  . Stage 3a chronic kidney disease (HCC) 07/21/2019  . Statin intolerance 07/31/2020  . Thoracic radiculopathy 06/16/2019  . Tremor 06/16/2019  . Vitamin B12 deficiency    Injections  . Vitamin D deficiency 09/17/2016    Family History  Problem Relation Age of Onset  . Stroke Father 49       Deceased  . Hypertension Father   . Alcoholism Father   . Heart disease Maternal Grandfather 68       Deceased  . Alzheimer's disease Mother 54       Deceased in early 78s  . Heart disease Paternal Grandfather 14       Deceased  . Alcoholism Brother   . Hypertension Son     Social History   Socioeconomic History  . Marital status: Married    Spouse name: Not on file  . Number of children: Not on file  . Years of education: Not on file  . Highest education level: Not on file  Occupational History  . Not on file  Tobacco Use  . Smoking status: Former Smoker    Types: Cigarettes  . Smokeless tobacco: Never Used  . Tobacco comment: Quit>40 years  Substance and Sexual Activity  . Alcohol use: Not Currently    Alcohol/week: 0.0 standard drinks  . Drug use: Never  . Sexual activity: Not on file  Other Topics Concern  . Not on file  Social History Narrative  . Not on file   Social Determinants of Health   Financial Resource Strain: Not on file  Food Insecurity: Not on file  Transportation Needs: Not on file  Physical Activity: Not on file  Stress: Not on file  Social Connections: Not on file  Intimate Partner Violence: Not on file    Past Medical History, Surgical history, Social history, and Family history  were reviewed and updated as appropriate.   Please see review of systems for further details on the patient's review from today.   Objective:   Physical Exam:  There were no vitals taken for this visit.  Physical Exam Constitutional:      General: He is not in acute distress.    Appearance: He is well-developed.  Musculoskeletal:  General: No deformity.  Neurological:     Mental Status: He is alert and oriented to person, place, and time.     Coordination: Coordination normal.  Psychiatric:        Attention and Perception: Attention normal. He is attentive.        Mood and Affect: Mood normal. Mood is not anxious or depressed. Affect is not labile, blunt, angry or inappropriate.        Speech: Speech normal.        Behavior: Behavior normal.        Thought Content: Thought content normal. Thought content does not include homicidal or suicidal ideation. Thought content does not include homicidal or suicidal plan.        Cognition and Memory: Cognition normal.        Judgment: Judgment normal.     Comments: Insight is good. Good humor.     Lab Review:     Component Value Date/Time   NA 137 12/14/2020 1022   NA 141 11/28/2020 0841   K 4.2 12/14/2020 1022   CL 105 12/14/2020 1022   CO2 24 12/14/2020 1022   GLUCOSE 105 (H) 12/14/2020 1022   BUN 22 12/14/2020 1022   BUN 17 11/28/2020 0841   CREATININE 1.36 (H) 12/14/2020 1022   CREATININE 1.34 (H) 07/31/2020 1138   CALCIUM 8.4 (L) 12/14/2020 1022   PROT 5.9 (L) 11/28/2020 0841   ALBUMIN 4.2 11/28/2020 0841   AST 16 11/28/2020 0841   ALT 22 11/28/2020 0841   ALKPHOS 77 11/28/2020 0841   BILITOT 0.3 11/28/2020 0841   GFRNONAA 56 (L) 12/14/2020 1022   GFRAA 75 11/28/2020 0841       Component Value Date/Time   WBC 3.1 (L) 12/14/2020 1022   RBC 3.54 (L) 12/14/2020 1022   HGB 10.8 (L) 12/14/2020 1022   HGB 10.8 (L) 08/30/2020 1139   HCT 32.7 (L) 12/14/2020 1022   HCT 32.4 (L) 08/30/2020 1139   PLT 164  12/14/2020 1022   PLT 212 08/30/2020 1139   MCV 92.4 12/14/2020 1022   MCV 92 08/30/2020 1139   MCH 30.5 12/14/2020 1022   MCHC 33.0 12/14/2020 1022   RDW 13.2 12/14/2020 1022   RDW 12.0 08/30/2020 1139   LYMPHSABS 0.3 (L) 12/14/2020 1022   LYMPHSABS 0.5 (L) 08/30/2020 1139   MONOABS 0.7 12/14/2020 1022   EOSABS 0.1 12/14/2020 1022   EOSABS 0.3 08/30/2020 1139   BASOSABS 0.0 12/14/2020 1022   BASOSABS 0.1 08/30/2020 1139    Lithium Lvl  Date Value Ref Range Status  12/23/2019 0.8 0.6 - 1.2 mmol/L Final    Comment:                                     Detection Limit = 0.1                           <0.1 indicates None Detected      No results found for: PHENYTOIN, PHENOBARB, VALPROATE, CBMZ   .res Assessment: Plan:    Bipolar disorder with moderate depression (HCC) - Plan: Lithium level  Generalized anxiety disorder  Lithium use - Plan: Lithium level   He has failed multiple other psychiatric medications and does not wish to have further med changes today.  Lithium level from January 2021 0.8 on 600 mg daily. Gradually it's crept up.  Disc in detail it's relation to renal function and Cr has increased gradually. CR 1.35 01/18/21  Continue lithium 450 mg daily but tremor did not get better with reduction last visit. Check lithium level now.  Reduce carbamazepine by 1/2 tablet every week until off of it. Change DT DDI with CBZ and cardiac meds.  Disc risk mood swings and call if anything is worse.  Counseled patient regarding potential benefits, risks, and side effects of lithium to include potential risk of lithium affecting thyroid and renal function.  Discussed need for periodic lab monitoring to determine drug level and to assess for potential adverse effects.  Counseled patient regarding signs and symptoms of lithium toxicity and advised that they notify office immediately or seek urgent medical attention if experiencing these signs and symptoms.  Patient advised to  contact office with any questions or concerns.  No other changes indicated.  He's failed multiple others.  2 mos  Meredith Staggers, MD, DFAPA   Please see After Visit Summary for patient specific instructions.  Future Appointments  Date Time Provider Department Center  02/22/2021  9:20 AM Revankar, Aundra Dubin, MD [facility] None  07/02/2021 10:30 AM [name], Jilda Roche, DO [facility]    Orders Placed This Encounter  Procedures  . Lithium level      -------------------------------

## 2021-02-12 NOTE — Patient Instructions (Signed)
Reduce carbamazepine by 1/2 tablet every week until off of it.

## 2021-02-15 LAB — LITHIUM LEVEL: Lithium Lvl: 0.6 mmol/L (ref 0.6–1.2)

## 2021-02-15 NOTE — Progress Notes (Signed)
Lithium level 0.6 after reduction from 600 to 450 ER.  Prior levels 0.7-0.8.  pt clinically stable so no change

## 2021-02-20 ENCOUNTER — Other Ambulatory Visit: Payer: Self-pay

## 2021-02-20 DIAGNOSIS — I10 Essential (primary) hypertension: Secondary | ICD-10-CM | POA: Insufficient documentation

## 2021-02-22 ENCOUNTER — Other Ambulatory Visit: Payer: Self-pay

## 2021-02-22 ENCOUNTER — Ambulatory Visit (INDEPENDENT_AMBULATORY_CARE_PROVIDER_SITE_OTHER): Payer: HMO | Admitting: Cardiology

## 2021-02-22 ENCOUNTER — Encounter: Payer: Self-pay | Admitting: Cardiology

## 2021-02-22 VITALS — BP 156/80 | HR 55 | Ht 67.0 in | Wt 158.4 lb

## 2021-02-22 DIAGNOSIS — I10 Essential (primary) hypertension: Secondary | ICD-10-CM | POA: Diagnosis not present

## 2021-02-22 DIAGNOSIS — E782 Mixed hyperlipidemia: Secondary | ICD-10-CM | POA: Diagnosis not present

## 2021-02-22 DIAGNOSIS — I25119 Atherosclerotic heart disease of native coronary artery with unspecified angina pectoris: Secondary | ICD-10-CM

## 2021-02-22 DIAGNOSIS — I700 Atherosclerosis of aorta: Secondary | ICD-10-CM | POA: Diagnosis not present

## 2021-02-22 DIAGNOSIS — N1831 Chronic kidney disease, stage 3a: Secondary | ICD-10-CM

## 2021-02-22 NOTE — Progress Notes (Signed)
Cardiology Office Note:    Date:  02/22/2021   ID:  Noah Grant, DOB Oct 31, 1951, MRN [id number]  PCP:  Sharlene Dory, DO  Cardiologist:  Garwin Brothers, MD   Referring MD: Sharlene Dory*    ASSESSMENT:    1. Coronary artery disease involving native coronary artery of native heart with angina pectoris (HCC)   2. Abdominal aortic atherosclerosis (HCC)   3. Mixed hyperlipidemia   4. Essential hypertension   5. Stage 3a chronic kidney disease (HCC)    PLAN:    In order of problems listed above:  1. Coronary artery disease: Secondary prevention stressed with the patient.  Importance of compliance with diet medication stressed and he vocalized understanding.  Obviously because of his rheumatoid arthritis he cannot exercise at this time.  Whenever he is better he walks 20 to 30 minutes without any problem and has done so recently without any issues. 2. Essential hypertension: Blood pressure stable and diet was emphasized.  This elevation in blood pressure he feels is because of pain and that seems reasonable.  He is talking to his rheumatoid arthritis doctors for treatment. 3. Mixed dyslipidemia: Diet was emphasized.  Recent lipids are fine he is taking low-dose statin and Zetia.  4. Chronic daily insufficiency: Followed by nephrologist. 5. Patient will be seen in follow-up appointment in 6 months or earlier if the patient has any concerns    Medication Adjustments/Labs and Tests Ordered: Current medicines are reviewed at length with the patient today.  Concerns regarding medicines are outlined above.  No orders of the defined types were placed in this encounter.  No orders of the defined types were placed in this encounter.    No chief complaint on file.    History of Present Illness:    Noah Grant is a 70 y.o. male.  Patient has past medical history of coronary artery disease, essential hypertension and dyslipidemia.  He has significant  rheumatoid arthritis and currently mentions to me that he has a flare in significant pain which he is driving his blood pressure.  He tells me that his blood pressures generally fine.  He also sees a nephrologist.  He has renal insufficiency.  At the time of my evaluation, the patient is alert awake oriented and in no distress.  Past Medical History:  Diagnosis Date   Abdominal aortic atherosclerosis (HCC)    Anemia    likely of chronic disease   Angina pectoris (HCC) 08/30/2020   Arthritis    Barrett's esophagus without dysplasia 03/25/2019   Formatting of this note might be different from the original. EGD done in 02/2019. Due in 3 years.   Bipolar 1 disorder (HCC)    Bipolar 1 disorder, mixed, moderate (HCC) 08/16/2014   Bipolar affective disorder, currently depressed, moderate (HCC) 03/08/2020   Bipolar depression (HCC) 08/16/2014   BPH with urinary obstruction 12/29/2018   Brain ischemia 03/08/2020   Cardiac murmur 08/30/2020   Chest pain 09/06/2020   Chicken pox    Chronic bronchitis (HCC)    Cognitive complaints 03/08/2020   Coronary artery disease involving native coronary artery of native heart with angina pectoris (HCC) 09/06/2020   Depression    Disc degeneration, 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   Essential hypertension    GAD (generalized anxiety disorder) 11/02/2018   Gastroesophageal reflux disease 08/16/2014   Formatting of this note might be different from the original. Last Assessment & Plan:  Well controlled. Continue current medications.   Gastroesophageal reflux disease without  esophagitis 08/16/2014   Formatting of this note might be different from the original. Last Assessment & Plan:  Well controlled. Continue current medications.   Herpes gingivostomatitis 02/10/2015   History of colon polyps 03/25/2019   Formatting of this note might be different from the original. tubular adenoma   History of stroke 11/10/2019   Hyperlipidemia    Hypertension    Ingrowing nail  03/12/2020   Labile hypertension 08/16/2014   Formatting of this note might be different from the original. Last Assessment & Plan:  Slightly above goal today secondary to pain. Will continue current regimen. Will check CMP today.   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 Concern over possible lichen planus. His dentist noticed lesions of his oral mucosa.  It was felt to be lichen planus.  He presents to discuss further.  Rarely does the area hurt.He smoked in the distant past EXAM shows several white patches on the buccal mucosa bilaterally.  No other concerning oral mucosal les   Long-term use of immunosuppressant medication 03/23/2018   Low testosterone 09/17/2016   Midline thoracic back pain 02/10/2015   Mixed hyperlipidemia 08/16/2014   Normochromic normocytic anemia 08/16/2014   PAD (peripheral artery disease) (HCC) 03/23/2019   Pain of right hip joint 01/19/2016   Rheumatic fever    Rheumatoid arthritis (HCC) 10/29/2018   Formatting of this note might be different from the original. Rheum:  Dr. Sharmon Revere Formerly on MTX - stopped due to mouth sores. Currently started on Arava.  Also on 5 mg prednisone x 3 months during run-in.   Right-sided chest wall pain 04/17/2019   Seasonal allergies 03/23/2018   Stage 3a chronic kidney disease (HCC) 07/21/2019   Statin intolerance 07/31/2020   Thoracic radiculopathy 06/16/2019   Tremor 06/16/2019   Vitamin B12 deficiency    Injections   Vitamin D deficiency 09/17/2016    Past Surgical History:  Procedure Laterality Date   CLAVICLE SURGERY     Right, Hardware placed   CORONARY STENT INTERVENTION N/A 09/06/2020   Procedure: CORONARY STENT INTERVENTION;  Surgeon: Tonny Bollman, MD;  Location: [facility] INVASIVE CV LAB;  Service: Cardiovascular;  Laterality: N/A;   LEFT HEART CATH AND CORONARY ANGIOGRAPHY N/A 09/06/2020   Procedure: LEFT HEART CATH AND CORONARY ANGIOGRAPHY;   Surgeon: Tonny Bollman, MD;  Location: [facility] INVASIVE CV LAB;  Service: Cardiovascular;  Laterality: N/A;   WISDOM TOOTH EXTRACTION      Current Medications: Current Meds  Medication Sig   amLODipine (NORVASC) 10 MG tablet TAKE ONE (1) TABLET BY MOUTH EVERY DAY   aspirin EC 81 MG tablet Take 81 mg by mouth daily.   Cholecalciferol 50 MCG (2000 UT) CAPS Take 2,000 Units by mouth daily.    clopidogrel (PLAVIX) 75 MG tablet Take 1 tablet (75 mg total) by mouth daily.   Coenzyme Q10 (COQ10) 200 MG CAPS Take 200 mg by mouth daily.   ezetimibe (ZETIA) 10 MG tablet Take 10 mg by mouth daily.   hydrALAZINE (APRESOLINE) 50 MG tablet Take 50 mg by mouth 3 (three) times daily.   labetalol (NORMODYNE) 100 MG tablet Take 1 tablet (100 mg total) by mouth 2 (two) times daily.   leflunomide (ARAVA) 20 MG tablet Take 1 tab 5 days weekly.   lithium carbonate 150 MG capsule TAKE THREE CAPSULES BY MOUTH EVERY NIGHT AT BEDTIME   losartan (COZAAR) 100 MG tablet Take 100 mg by mouth daily.  Lysine HCl 1000 MG TABS Take 1,000 mg by mouth 2 (two) times daily.    nitroGLYCERIN (NITROSTAT) 0.4 MG SL tablet Place 0.4 mg under the tongue every 5 (five) minutes as needed for chest pain.   pantoprazole (PROTONIX) 40 MG tablet Take 1 tablet (40 mg total) by mouth daily.   simvastatin (ZOCOR) 5 MG tablet TAKE ONE TABLET BY MOUTH DAILY   tamsulosin (FLOMAX) 0.4 MG CAPS capsule TAKE ONE CAPSULE BY MOUTH DAILY     Allergies:   Hydroxychloroquine, Methotrexate, Nsaids, Lovastatin, Pravastatin, and Rosuvastatin   Social History   Socioeconomic History   Marital status: Married    Spouse name: Not on file   Number of children: Not on file   Years of education: Not on file   Highest education level: Not on file  Occupational History   Not on file  Tobacco Use   Smoking status: Former Smoker    Types: Cigarettes   Smokeless tobacco: Never Used   Tobacco comment: Quit>40 years  Substance and  Sexual Activity   Alcohol use: Not Currently    Alcohol/week: 0.0 standard drinks   Drug use: Never   Sexual activity: Not on file  Other Topics Concern   Not on file  Social History Narrative   Not on file   Social Determinants of Health   Financial Resource Strain: Not on file  Food Insecurity: Not on file  Transportation Needs: Not on file  Physical Activity: Not on file  Stress: Not on file  Social Connections: Not on file     Family History: The patient's family history includes Alcoholism in his brother and father; Alzheimer's disease (age of onset: 71) in his mother; Heart disease (age of onset: 16) in his paternal grandfather; Heart disease (age of onset: 41) in his maternal grandfather; Hypertension in his father and son; Stroke (age of onset: 3) in his father.  ROS:   Please see the history of present illness.    All other systems reviewed and are negative.  EKGs/Labs/Other Studies Reviewed:    The following studies were reviewed today: EKG reveals sinus rhythm and nonspecific ST-T changes   Recent Labs: 11/28/2020: ALT 22 12/14/2020: BUN 22; Creatinine, Ser 1.36; Hemoglobin 10.8; Platelets 164; Potassium 4.2; Sodium 137  Recent Lipid Panel    Component Value Date/Time   CHOL 109 11/28/2020 0841   TRIG 99 11/28/2020 0841   HDL 33 (L) 11/28/2020 0841   CHOLHDL 3.3 11/28/2020 0841   CHOLHDL 5 05/31/2020 1112   VLDL 37.6 05/31/2020 1112   LDLCALC 57 11/28/2020 0841   LDLDIRECT 99.0 12/01/2019 1056    Physical Exam:    VS:  BP (!) 156/80    Pulse (!) 55    Ht 5\' 7"  (1.702 m)    Wt 158 lb 6.4 oz (71.8 kg)    SpO2 99%    BMI 24.81 kg/m     Wt Readings from Last 3 Encounters:  02/22/21 158 lb 6.4 oz (71.8 kg)  01/10/21 163 lb 2 oz (74 kg)  01/01/21 161 lb 2 oz (73.1 kg)     GEN: Patient is in no acute distress HEENT: Normal NECK: No JVD; No carotid bruits LYMPHATICS: No lymphadenopathy CARDIAC: Hear sounds regular, 2/6 systolic murmur at the  apex. RESPIRATORY:  Clear to auscultation without rales, wheezing or rhonchi  ABDOMEN: Soft, non-tender, non-distended MUSCULOSKELETAL:  No edema; No deformity  SKIN: Warm and dry NEUROLOGIC:  Alert and oriented x 3 PSYCHIATRIC:  Normal affect  Signed, Garwin Brothers, MD  02/22/2021 9:30 AM    [facility]

## 2021-02-22 NOTE — Patient Instructions (Signed)
Medication Instructions:  Your physician recommends that you continue on your current medications as directed. Please refer to the Current Medication list given to you today. *If you need a refill on your cardiac medications before your next appointment, please call your pharmacy*  Lab Work: None If you have labs (blood work) drawn today and your tests are completely normal, you will receive your results only by: [name] MyChart Message (if you have MyChart) OR . A paper copy in the mail If you have any lab test that is abnormal or we need to change your treatment, we will call you to review the results.   Testing/Procedures: None   Follow-Up: At [facility], you and your health needs are our priority.  As part of our continuing mission to provide you with exceptional heart care, we have created designated Provider Care Teams.  These Care Teams include your primary Cardiologist (physician) and Advanced Practice Providers (APPs -  Physician Assistants and Nurse Practitioners) who all work together to provide you with the care you need, when you need it.   Your next appointment:   6 month(s)  The format for your next appointment:   In Person  Provider:   Belva Crome, MD

## 2021-03-06 ENCOUNTER — Ambulatory Visit (INDEPENDENT_AMBULATORY_CARE_PROVIDER_SITE_OTHER): Payer: HMO | Admitting: Family Medicine

## 2021-03-06 ENCOUNTER — Encounter: Payer: Self-pay | Admitting: Family Medicine

## 2021-03-06 ENCOUNTER — Other Ambulatory Visit: Payer: Self-pay

## 2021-03-06 VITALS — BP 120/72 | HR 58 | Temp 98.5°F | Ht 67.0 in | Wt 162.2 lb

## 2021-03-06 DIAGNOSIS — M25512 Pain in left shoulder: Secondary | ICD-10-CM | POA: Diagnosis not present

## 2021-03-06 DIAGNOSIS — M069 Rheumatoid arthritis, unspecified: Secondary | ICD-10-CM | POA: Diagnosis not present

## 2021-03-06 MED ORDER — PREDNISONE 20 MG PO TABS: 40.0000 mg | ORAL_TABLET | Freq: Every day | ORAL | 0 refills | Status: AC

## 2021-03-06 NOTE — Patient Instructions (Signed)
Ice/cold pack over area for 10-15 min twice daily.  Heat (pad or rice pillow in microwave) over affected area, 10-15 minutes twice daily.   OK to take Tylenol 1000 mg (2 extra strength tabs) or 975 mg (3 regular strength tabs) every 6 hours as needed.  Let us know if you need anything.  EXERCISES  RANGE OF MOTION (ROM) AND STRETCHING EXERCISES These exercises may help you when beginning to rehabilitate your injury. While completing these exercises, remember:  Restoring tissue flexibility helps normal motion to return to the joints. This allows healthier, less painful movement and activity. An effective stretch should be held for at least 30 seconds. A stretch should never be painful. You should only feel a gentle lengthening or release in the stretched tissue.  ROM - Pendulum Bend at the waist so that your right / left arm falls away from your body. Support yourself with your opposite hand on a solid surface, such as a table or a countertop. Your right / left arm should be perpendicular to the ground. If it is not perpendicular, you need to lean over farther. Relax the muscles in your right / left arm and shoulder as much as possible. Gently sway your hips and trunk so they move your right / left arm without any use of your right / left shoulder muscles. Progress your movements so that your right / left arm moves side to side, then forward and backward, and finally, both clockwise and counterclockwise. Complete 10-15 repetitions in each direction. Many people use this exercise to relieve discomfort in their shoulder as well as to gain range of motion. Repeat 2 times. Complete this exercise 3 times per week.  STRETCH - Flexion, Standing Stand with good posture. With an underhand grip on your right / left hand and an overhand grip on the opposite hand, grasp a broomstick or cane so that your hands are a little more than shoulder-width apart. Keeping your right / left elbow straight and  shoulder muscles relaxed, push the stick with your opposite hand to raise your right / left arm in front of your body and then overhead. Raise your arm until you feel a stretch in your right / left shoulder, but before you have increased shoulder pain. Try to avoid shrugging your right / left shoulder as your arm rises by keeping your shoulder blade tucked down and toward your mid-back spine. Hold 30 seconds. Slowly return to the starting position. Repeat 2 times. Complete this exercise 3 times per week.  STRETCH - Internal Rotation Place your right / left hand behind your back, palm-up. Throw a towel or belt over your opposite shoulder. Grasp the towel/belt with your right / left hand. While keeping an upright posture, gently pull up on the towel/belt until you feel a stretch in the front of your right / left shoulder. Avoid shrugging your right / left shoulder as your arm rises by keeping your shoulder blade tucked down and toward your mid-back spine. Hold 30. Release the stretch by lowering your opposite hand. Repeat 2 times. Complete this exercise 3 times per week.  STRETCH - External Rotation and Abduction Stagger your stance through a doorframe. It does not matter which foot is forward. As instructed by your physician, physical therapist or athletic trainer, place your hands: And forearms above your head and on the door frame. And forearms at head-height and on the door frame. At elbow-height and on the door frame. Keeping your head and chest upright and your   stomach muscles tight to prevent over-extending your low-back, slowly shift your weight onto your front foot until you feel a stretch across your chest and/or in the front of your shoulders. Hold 30 seconds. Shift your weight to your back foot to release the stretch. Repeat 2 times. Complete this stretch 3 times per week.   STRENGTHENING EXERCISES  These exercises may help you when beginning to rehabilitate your injury. They may  resolve your symptoms with or without further involvement from your physician, physical therapist or athletic trainer. While completing these exercises, remember:  Muscles can gain both the endurance and the strength needed for everyday activities through controlled exercises. Complete these exercises as instructed by your physician, physical therapist or athletic trainer. Progress the resistance and repetitions only as guided. You may experience muscle soreness or fatigue, but the pain or discomfort you are trying to eliminate should never worsen during these exercises. If this pain does worsen, stop and make certain you are following the directions exactly. If the pain is still present after adjustments, discontinue the exercise until you can discuss the trouble with your clinician. If advised by your physician, during your recovery, avoid activity or exercises which involve actions that place your right / left hand or elbow above your head or behind your back or head. These positions stress the tissues which are trying to heal.  STRENGTH - Scapular Depression and Adduction With good posture, sit on a firm chair. Supported your arms in front of you with pillows, arm rests or a table top. Have your elbows in line with the sides of your body. Gently draw your shoulder blades down and toward your mid-back spine. Gradually increase the tension without tensing the muscles along the top of your shoulders and the back of your neck. Hold for 3 seconds. Slowly release the tension and relax your muscles completely before completing the next repetition. After you have practiced this exercise, remove the arm support and complete it in standing as well as sitting. Repeat 2 times. Complete this exercise 3 times per week.   STRENGTH - External Rotators Secure a rubber exercise band/tubing to a fixed object so that it is at the same height as your right / left elbow when you are standing or sitting on a firm  surface. Stand or sit so that the secured exercise band/tubing is at your side that is not injured. Bend your elbow 90 degrees. Place a folded towel or small pillow under your right / left arm so that your elbow is a few inches away from your side. Keeping the tension on the exercise band/tubing, pull it away from your body, as if pivoting on your elbow. Be sure to keep your body steady so that the movement is only coming from your shoulder rotating. Hold 3 seconds. Release the tension in a controlled manner as you return to the starting position. Repeat 2 times. Complete this exercise 3 times per week.   STRENGTH - Supraspinatus Stand or sit with good posture. Grasp a 2-3 lb weight or an exercise band/tubing so that your hand is "thumbs-up," like when you shake hands. Slowly lift your right / left hand from your thigh into the air, traveling about 30 degrees from straight out at your side. Lift your hand to shoulder height or as far as you can without increasing any shoulder pain. Initially, many people do not lift their hands above shoulder height. Avoid shrugging your right / left shoulder as your arm rises by keeping your   shoulder blade tucked down and toward your mid-back spine. Hold for 3 seconds. Control the descent of your hand as you slowly return to your starting position. Repeat 2 times. Complete this exercise 3 times per week.   STRENGTH - Shoulder Extensors Secure a rubber exercise band/tubing so that it is at the height of your shoulders when you are either standing or sitting on a firm arm-less chair. With a thumbs-up grip, grasp an end of the band/tubing in each hand. Straighten your elbows and lift your hands straight in front of you at shoulder height. Step back away from the secured end of band/tubing until it becomes tense. Squeezing your shoulder blades together, pull your hands down to the sides of your thighs. Do not allow your hands to go behind you. Hold for 3 seconds.  Slowly ease the tension on the band/tubing as you reverse the directions and return to the starting position. Repeat 2 times. Complete this exercise 3 times per week.   STRENGTH - Scapular Retractors Secure a rubber exercise band/tubing so that it is at the height of your shoulders when you are either standing or sitting on a firm arm-less chair. With a palm-down grip, grasp an end of the band/tubing in each hand. Straighten your elbows and lift your hands straight in front of you at shoulder height. Step back away from the secured end of band/tubing until it becomes tense. Squeezing your shoulder blades together, draw your elbows back as you bend them. Keep your upper arm lifted away from your body throughout the exercise. Hold 3 seconds. Slowly ease the tension on the band/tubing as you reverse the directions and return to the starting position. Repeat 2 times. Complete this exercise 3 times per week.  STRENGTH - Scapular Depressors Find a sturdy chair without wheels, such as a from a dining room table. Keeping your feet on the floor, lift your bottom from the seat and lock your elbows. Keeping your elbows straight, allow gravity to pull your body weight down. Your shoulders will rise toward your ears. Raise your body against gravity by drawing your shoulder blades down your back, shortening the distance between your shoulders and ears. Although your feet should always maintain contact with the floor, your feet should progressively support less body weight as you get stronger. Hold 3 seconds. In a controlled and slow manner, lower your body weight to begin the next repetition. Repeat 2 times. Complete this exercise 3 times per week.    This information is not intended to replace advice given to you by your health care provider. Make sure you discuss any questions you have with your health care provider.   Document Released: 10/09/2005 Document Revised: 12/16/2014 Document Reviewed:  03/09/2009 [employer] Interactive Patient Education 2016 [employer] 

## 2021-03-06 NOTE — Progress Notes (Signed)
Musculoskeletal Exam  Patient: Noah Grant DOB: Mar 22, 1951  DOS: 03/06/2021  SUBJECTIVE:  Chief Complaint:   Chief Complaint  Patient presents with  . Shoulder Pain    Left shoulder    Noah Grant is a 70 y.o.  male for evaluation and treatment of L shoulder pain.   Onset:  3 weeks ago. No inj or change in activity.  Location: L shoulder Character:  burning  Progression of issue:  is unchanged Associated symptoms: decreased ROM; no bruising, redness, swelling Treatment: to date has been acetaminophen.   Neurovascular symptoms: no  Rheumatoid arthritis The patient follows with rheumatology and they recently decrease his leflunomide from 7 days/week to 5 days/week.  Since then, his rheumatoid arthritis has not been well controlled.  He was decreased due to anemia.  3 weeks ago, he reports every joint his body started to hurt and dry mouth.  No injury or change in activity.  Past Medical History:  Diagnosis Date  . Abdominal aortic atherosclerosis (HCC)   . Anemia    likely of chronic disease  . Angina pectoris (HCC) 08/30/2020  . Arthritis   . Barrett's esophagus without dysplasia 03/25/2019   Formatting of this note might be different from the original. EGD done in 02/2019. Due in 3 years.  . Bipolar 1 disorder (HCC)   . Bipolar 1 disorder, mixed, moderate (HCC) 08/16/2014  . Bipolar affective disorder, currently depressed, moderate (HCC) 03/08/2020  . Bipolar depression (HCC) 08/16/2014  . BPH with urinary obstruction 12/29/2018  . Brain ischemia 03/08/2020  . Cardiac murmur 08/30/2020  . Chest pain 09/06/2020  . Chicken pox   . Chronic bronchitis (HCC)   . Cognitive complaints 03/08/2020  . Coronary artery disease involving native coronary artery of native heart with angina pectoris (HCC) 09/06/2020  . Depression   . Disc degeneration, 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  . Essential hypertension   . GAD (generalized anxiety disorder) 11/02/2018  . Gastroesophageal reflux disease  08/16/2014   Formatting of this note might be different from the original. Last Assessment & Plan:  Well controlled. Continue current medications.  . Gastroesophageal reflux disease without esophagitis 08/16/2014   Formatting of this note might be different from the original. Last Assessment & Plan:  Well controlled. Continue current medications.  [name] Herpes gingivostomatitis 02/10/2015  . History of colon polyps 03/25/2019   Formatting of this note might be different from the original. tubular adenoma  . History of stroke 11/10/2019  . Hyperlipidemia   . Hypertension   . Ingrowing nail 03/12/2020  . Labile hypertension 08/16/2014   Formatting of this note might be different from the original. Last Assessment & Plan:  Slightly above goal today secondary to pain. Will continue current regimen. Will check CMP today.  . 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 Concern over possible lichen planus. His dentist noticed lesions of his oral mucosa.  It was felt to be lichen planus.  He presents to discuss further.  Rarely does the area hurt.He smoked in the distant past EXAM shows several white patches on the buccal mucosa bilaterally.  No other concerning oral mucosal les  . Long-term use of immunosuppressant medication 03/23/2018  . Low testosterone 09/17/2016  . Midline thoracic back pain 02/10/2015  . Mixed hyperlipidemia 08/16/2014  . Normochromic normocytic anemia 08/16/2014  . PAD (peripheral artery disease) (HCC) 03/23/2019  . Pain of right hip joint 01/19/2016  . Rheumatic fever   .  Rheumatoid arthritis (HCC) 10/29/2018   Formatting of this note might be different from the original. Rheum:  Dr. Sharmon Revere Formerly on MTX - stopped due to mouth sores. Currently started on Arava.  Also on 5 mg prednisone x 3 months during run-in.  . Right-sided chest wall pain 04/17/2019  . Seasonal allergies 03/23/2018  . Stage 3a chronic kidney disease (HCC)  07/21/2019  . Statin intolerance 07/31/2020  . Thoracic radiculopathy 06/16/2019  . Tremor 06/16/2019  . Vitamin B12 deficiency    Injections  . Vitamin D deficiency 09/17/2016    Objective: VITAL SIGNS: BP 120/72 (BP Location: Left Arm, Patient Position: Sitting, Cuff Size: Normal)   Pulse (!) 58   Temp 98.5 F (36.9 C) (Oral)   Ht 5\' 7"  (1.702 m)   Wt 162 lb 4 oz (73.6 kg)   SpO2 100%   BMI 25.41 kg/m  Constitutional: Well formed, well developed. No acute distress. Thorax & Lungs: No accessory muscle use Musculoskeletal: L shoulder.   Normal active range of motion: no.   Normal passive range of motion: no Tenderness to palpation: no Deformity: no Ecchymosis: no Tests positive: Neer's Tests negative: [place], lift off, cross over, empty can, Speed's Neurologic: Normal sensory function. No focal deficits noted. DTR's equal and symmetric in UE's. No clonus. Psychiatric: Normal mood. Age appropriate judgment and insight. Alert & oriented x 3.    Assessment:  Left shoulder pain, unspecified chronicity - Plan: predniSONE (DELTASONE) 20 MG tablet  Rheumatoid arthritis, involving unspecified site, unspecified whether rheumatoid factor present (HCC) - Plan: predniSONE (DELTASONE) 20 MG tablet  Plan: 1.  5-day prednisone burst, 40 mg/day.  Stretches/exercises, heat, ice, Tylenol.  If he is no better in 3 days, he will let know and I will consider injection. 2.  5-day prednisone burst should help with this as well.  He asked if he should restart leflunomide 7 days weekly which I said no, wait till he sees his rheumatologist. The patient voiced understanding and agreement to the plan.   [place], DO 03/06/21  11:03 AM

## 2021-03-19 ENCOUNTER — Ambulatory Visit (INDEPENDENT_AMBULATORY_CARE_PROVIDER_SITE_OTHER): Payer: HMO | Admitting: Family Medicine

## 2021-03-19 ENCOUNTER — Other Ambulatory Visit: Payer: Self-pay

## 2021-03-19 ENCOUNTER — Encounter: Payer: Self-pay | Admitting: Family Medicine

## 2021-03-19 VITALS — BP 132/72 | HR 66 | Temp 99.2°F | Ht 67.0 in | Wt 160.4 lb

## 2021-03-19 DIAGNOSIS — M25512 Pain in left shoulder: Secondary | ICD-10-CM | POA: Diagnosis not present

## 2021-03-19 MED ORDER — METHYLPREDNISOLONE ACETATE 80 MG/ML IJ SUSP
80.0000 mg | Freq: Once | INTRAMUSCULAR | Status: AC
  Administered 2021-03-19: 80 mg via INTRAMUSCULAR

## 2021-03-19 NOTE — Progress Notes (Signed)
Chief Complaint  Patient presents with  . Shoulder Pain    Subjective: Patient is a 70 y.o. male here for f/u L shoulder pain.  Was placed on a 5 d pred burst at end of March. It did help. The pain came back "with a vengeance".  He has been using Tylenol without relief.  He reiterates that his entire body has been in more pain since his leflunomide dosage decreased from 7 days/week to 5 days/week due to anemia.  Rheumatologist will not see him until June.  Past Medical History:  Diagnosis Date  . Abdominal aortic atherosclerosis (HCC)   . Anemia    likely of chronic disease  . Angina pectoris (HCC) 08/30/2020  . Arthritis   . Barrett's esophagus without dysplasia 03/25/2019   Formatting of this note might be different from the original. EGD done in 02/2019. Due in 3 years.  . Bipolar 1 disorder (HCC)   . Bipolar 1 disorder, mixed, moderate (HCC) 08/16/2014  . Bipolar affective disorder, currently depressed, moderate (HCC) 03/08/2020  . Bipolar depression (HCC) 08/16/2014  . BPH with urinary obstruction 12/29/2018  . Brain ischemia 03/08/2020  . Cardiac murmur 08/30/2020  . Chest pain 09/06/2020  . Chicken pox   . Chronic bronchitis (HCC)   . Cognitive complaints 03/08/2020  . Coronary artery disease involving native coronary artery of native heart with angina pectoris (HCC) 09/06/2020  . Depression   . Disc degeneration, 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  . Essential hypertension   . GAD (generalized anxiety disorder) 11/02/2018  . Gastroesophageal reflux disease 08/16/2014   Formatting of this note might be different from the original. Last Assessment & Plan:  Well controlled. Continue current medications.  . Gastroesophageal reflux disease without esophagitis 08/16/2014   Formatting of this note might be different from the original. Last Assessment & Plan:  Well controlled. Continue current medications.  [name] Herpes gingivostomatitis 02/10/2015  . History of colon polyps 03/25/2019   Formatting of this note might  be different from the original. tubular adenoma  . History of stroke 11/10/2019  . Hyperlipidemia   . Hypertension   . Ingrowing nail 03/12/2020  . Labile hypertension 08/16/2014   Formatting of this note might be different from the original. Last Assessment & Plan:  Slightly above goal today secondary to pain. Will continue current regimen. Will check CMP today.  . 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 Concern over possible lichen planus. His dentist noticed lesions of his oral mucosa.  It was felt to be lichen planus.  He presents to discuss further.  Rarely does the area hurt.He smoked in the distant past EXAM shows several white patches on the buccal mucosa bilaterally.  No other concerning oral mucosal les  . Long-term use of immunosuppressant medication 03/23/2018  . Low testosterone 09/17/2016  . Midline thoracic back pain 02/10/2015  . Mixed hyperlipidemia 08/16/2014  . Normochromic normocytic anemia 08/16/2014  . PAD (peripheral artery disease) (HCC) 03/23/2019  . Pain of right hip joint 01/19/2016  . Rheumatic fever   . Rheumatoid arthritis (HCC) 10/29/2018   Formatting of this note might be different from the original. Rheum:  Dr. Sharmon Revere Formerly on MTX - stopped due to mouth sores. Currently started on Arava.  Also on 5 mg prednisone x 3 months during run-in.  . Right-sided chest wall pain 04/17/2019  . Seasonal allergies 03/23/2018  . Stage 3a chronic kidney disease (HCC) 07/21/2019  .  Statin intolerance 07/31/2020  . Thoracic radiculopathy 06/16/2019  . Tremor 06/16/2019  . Vitamin B12 deficiency    Injections  . Vitamin D deficiency 09/17/2016    Objective: BP 132/72 (BP Location: Left Arm, Patient Position: Sitting, Cuff Size: Normal)   Pulse 66   Temp 99.2 F (37.3 C) (Oral)   Ht 5\' 7"  (1.702 m)   Wt 160 lb 6 oz (72.7 kg)   SpO2 98%   BMI 25.12 kg/m  General: Awake, appears stated age MSK: Left shoulder  decreased active and passive range of motion, tender to palpation over the left coracoid, equivocal Neer's and speeds, negative crossover, [place]ri[place], liftoff, empty can Lungs: No accessory muscle use Psych: Age appropriate judgment and insight, normal affect and mood  Assessment and Plan: Left shoulder pain, unspecified chronicity - Plan: Ambulatory referral to Sports Medicine, methylPREDNISolone acetate (DEPO-MEDROL) injection 80 mg  Depo injection today.  Continue stretches and exercises at home.  Continue Tylenol, add topical Voltaren/Flagyl for now.  Refer to sports medicine for further evaluation.  I will see him as originally scheduled with me otherwise. The patient voiced understanding and agreement to the plan.  [place], DO 03/19/21  1:03 PM

## 2021-03-19 NOTE — Patient Instructions (Signed)
Try topical Voltaren/diclofenac along with Tylenol.   If you do not hear anything about your referral in the next 1-2 weeks, call our office and ask for an update.  Continue the stretches/exercises for the shoulder.   Let us know if you need anything.

## 2021-03-21 ENCOUNTER — Other Ambulatory Visit: Payer: Self-pay | Admitting: Family Medicine

## 2021-03-21 DIAGNOSIS — I700 Atherosclerosis of aorta: Secondary | ICD-10-CM

## 2021-03-27 ENCOUNTER — Encounter: Payer: Self-pay | Admitting: Family Medicine

## 2021-04-09 ENCOUNTER — Other Ambulatory Visit: Payer: Self-pay

## 2021-04-09 ENCOUNTER — Encounter: Payer: Self-pay | Admitting: Psychiatry

## 2021-04-09 ENCOUNTER — Ambulatory Visit (INDEPENDENT_AMBULATORY_CARE_PROVIDER_SITE_OTHER): Payer: HMO | Admitting: Psychiatry

## 2021-04-09 DIAGNOSIS — F411 Generalized anxiety disorder: Secondary | ICD-10-CM

## 2021-04-09 DIAGNOSIS — F3132 Bipolar disorder, current episode depressed, moderate: Secondary | ICD-10-CM

## 2021-04-09 DIAGNOSIS — Z79899 Other long term (current) drug therapy: Secondary | ICD-10-CM

## 2021-04-09 NOTE — Progress Notes (Signed)
Noah Grant [id number] Mar 03, 1951 70 y.o.  Subjective:   Patient ID:  Noah Grant is a 70 y.o. (DOB January 05, 1951) male.  Chief Complaint:  Chief Complaint  Patient presents with  . Follow-up  . Bipolar disorder with moderate depression (HCC)  . Anxiety    Depression        Associated symptoms include no decreased concentration and no suicidal ideas.  Past medical history includes anxiety.   Anxiety Symptoms include nervous/anxious behavior. Patient reports no confusion, decreased concentration or suicidal ideas.    Medication Refill Associated symptoms include arthralgias. Pertinent negatives include no weakness.   [name] presents to the office today for follow-up of bipolar, GAD, poor STM.  seen May 18, 2019 and 11/17/2019 without med changes.    05/17/2020 appointment with the following noted: Word recall problems with neuropsych testing by Dr. Orie Fisherman inconclusive results.  Test reviewed. Still tremor can interfere with eating. Nocturia Dt overactive bladder. Overall mentally ok but too many medical concerns with RA and muscle pain.   Chronic pain issues ongoing.   Htn controlled.  Plan: Lithium level from January 2021 0.8 on 600 mg daily. Gradually it's crept up.  Disc in detail it's relation to renal function and Cr has increased gradually. BC tremor & jerks reduce to 450 mg daily.  02/12/2021 appointment with the following noted: Reduced ithium with no change in tremor. End of December, CVD stent placement and reaction to contrast dye.   Cardiologists ask if he can come off CBZ.   DT DDI. In the last year has felet mentally better than ever.   Plan: Reduce carbamazepine by 1/2 tablet every week until off of it.  04/09/2021 appointment with the following noted: Off CBZ and didn't have any problems.  No worse for mood or anxiety.  Never slept good in life.  Melatonin caused dizziness. Checked lithium level. 06  No change in tremors in a long time.   Maninly noticeable with fine motor control.  Depression is somewhat chronic and waxes and wanes dependent on health issues.  Avoids stressors if possible.   Patient reports stable mood and denie irritable moods except as noted.  Patient denies any recent difficulty with anxiety except situational.  Patient denies unusual difficulty with sleep initiation or maintenance except awakens with pain chronically. Denies appetite disturbance.  Patient reports that energy and motivation have been good.  Patient denies any difficulty with concentration  But history of word finding from ministrokes..  Patient denies any suicidal ideation.  Has seen nephorologists and rheumatologists and neurologist, [facility].   Lives in [place] now  Previous psych med trials are extensive and include meds for anxiety and mood.  These include clonidine, buspirone, Abilify 5 mg, temazepam, olanzapine, bupropion, Strattera, Depakote, Prozac, Ritalin, Serzone 700 mg daily, Provigil, sertraline, Geodon, lamotrigine, mirtazapine, Pamelor, amitriptyline 450 mg/day, duloxetine, Pristiq which caused rage, Cerefolin NAC, Ambien, ProSom, trazodone which was ineffective.  Review of Systems:  Review of Systems  Musculoskeletal: Positive for arthralgias and gait problem.  Neurological: Positive for tremors. Negative for weakness.       Occ jerks  Psychiatric/Behavioral: Positive for depression and dysphoric mood. Negative for agitation, behavioral problems, confusion, decreased concentration, hallucinations, self-injury, sleep disturbance and suicidal ideas. The patient is nervous/anxious. The patient is not hyperactive.     Medications: I have reviewed the patient's current medications.  Current Outpatient Medications  Medication Sig Dispense Refill  . amLODipine (NORVASC) 10 MG tablet TAKE ONE (1)  TABLET BY MOUTH EVERY DAY 90 tablet 2  . aspirin EC 81 MG tablet Take 81 mg by mouth daily.    . Cholecalciferol 50 MCG (2000  UT) CAPS Take 2,000 Units by mouth daily.     . clopidogrel (PLAVIX) 75 MG tablet Take 1 tablet (75 mg total) by mouth daily. 90 tablet 3  . Coenzyme Q10 (COQ10) 200 MG CAPS Take 200 mg by mouth daily.    [name] COVID-19 mRNA vaccine, Moderna, 100 MCG/0.5ML injection INJECT AS DIRECTED .25 mL 0  . ezetimibe (ZETIA) 10 MG tablet Take 10 mg by mouth daily.    . hydrALAZINE (APRESOLINE) 50 MG tablet Take 50 mg by mouth 3 (three) times daily.    [name] labetalol (NORMODYNE) 100 MG tablet Take 1 tablet (100 mg total) by mouth 2 (two) times daily. 180 tablet 2  . leflunomide (ARAVA) 20 MG tablet Take 1 tab 5 days weekly.    [name] lithium carbonate 150 MG capsule TAKE THREE CAPSULES BY MOUTH EVERY NIGHT AT BEDTIME 270 capsule 1  . losartan (COZAAR) 100 MG tablet Take 100 mg by mouth daily.    [name] Lysine HCl 1000 MG TABS Take 1,000 mg by mouth 2 (two) times daily.     . nitroGLYCERIN (NITROSTAT) 0.4 MG SL tablet Place 0.4 mg under the tongue every 5 (five) minutes as needed for chest pain.    . pantoprazole (PROTONIX) 40 MG tablet Take 1 tablet (40 mg total) by mouth daily. 90 tablet 2  . simvastatin (ZOCOR) 5 MG tablet TAKE ONE TABLET BY MOUTH DAILY 30 tablet 1  . tamsulosin (FLOMAX) 0.4 MG CAPS capsule TAKE ONE CAPSULE BY MOUTH DAILY (Patient not taking: Reported on 04/09/2021) 30 capsule 3   No current facility-administered medications for this visit.    Medication Side Effects: None  Allergies:  Allergies  Allergen Reactions  . Hydroxychloroquine Other (See Comments)    Terrible Nightmares  . Methotrexate Dermatitis    Developed Mouth Sores  . Nsaids Dermatitis    Developed Mouth Blisters  . Lovastatin Other (See Comments)    Caused abdominal pain that radiated to the back  . Pravastatin Other (See Comments)    Caused abdominal pain that radiated to the back  . Rosuvastatin Other (See Comments)    Caused abdominal pain that radiated to the back    Past Medical History:  Diagnosis Date  . Abdominal  aortic atherosclerosis (HCC)   . Anemia    likely of chronic disease  . Angina pectoris (HCC) 08/30/2020  . Arthritis   . Barrett's esophagus without dysplasia 03/25/2019   Formatting of this note might be different from the original. EGD done in 02/2019. Due in 3 years.  . Bipolar 1 disorder (HCC)   . Bipolar 1 disorder, mixed, moderate (HCC) 08/16/2014  . Bipolar affective disorder, currently depressed, moderate (HCC) 03/08/2020  . Bipolar depression (HCC) 08/16/2014  . BPH with urinary obstruction 12/29/2018  . Brain ischemia 03/08/2020  . Cardiac murmur 08/30/2020  . Chest pain 09/06/2020  . Chicken pox   . Chronic bronchitis (HCC)   . Cognitive complaints 03/08/2020  . Coronary artery disease involving native coronary artery of native heart with angina pectoris (HCC) 09/06/2020  . Depression   . Disc degeneration, 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  . Essential hypertension   . GAD (generalized anxiety disorder) 11/02/2018  . Gastroesophageal reflux disease 08/16/2014   Formatting of this note might be different from the original. Last Assessment & Plan:  Well controlled.  Continue current medications.  . Gastroesophageal reflux disease without esophagitis 08/16/2014   Formatting of this note might be different from the original. Last Assessment & Plan:  Well controlled. Continue current medications.  [name] Herpes gingivostomatitis 02/10/2015  . History of colon polyps 03/25/2019   Formatting of this note might be different from the original. tubular adenoma  . History of stroke 11/10/2019  . Hyperlipidemia   . Hypertension   . Ingrowing nail 03/12/2020  . Labile hypertension 08/16/2014   Formatting of this note might be different from the original. Last Assessment & Plan:  Slightly above goal today secondary to pain. Will continue current regimen. Will check CMP today.  . 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 Concern over possible lichen planus.  His dentist noticed lesions of his oral mucosa.  It was felt to be lichen planus.  He presents to discuss further.  Rarely does the area hurt.He smoked in the distant past EXAM shows several white patches on the buccal mucosa bilaterally.  No other concerning oral mucosal les  . Long-term use of immunosuppressant medication 03/23/2018  . Low testosterone 09/17/2016  . Midline thoracic back pain 02/10/2015  . Mixed hyperlipidemia 08/16/2014  . Normochromic normocytic anemia 08/16/2014  . PAD (peripheral artery disease) (HCC) 03/23/2019  . Pain of right hip joint 01/19/2016  . Rheumatic fever   . Rheumatoid arthritis (HCC) 10/29/2018   Formatting of this note might be different from the original. Rheum:  Dr. Sharmon Revere Formerly on MTX - stopped due to mouth sores. Currently started on Arava.  Also on 5 mg prednisone x 3 months during run-in.  . Right-sided chest wall pain 04/17/2019  . Seasonal allergies 03/23/2018  . Stage 3a chronic kidney disease (HCC) 07/21/2019  . Statin intolerance 07/31/2020  . Thoracic radiculopathy 06/16/2019  . Tremor 06/16/2019  . Vitamin B12 deficiency    Injections  . Vitamin D deficiency 09/17/2016    Family History  Problem Relation Age of Onset  . Stroke Father 38       Deceased  . Hypertension Father   . Alcoholism Father   . Heart disease Maternal Grandfather 35       Deceased  . Alzheimer's disease Mother 44       Deceased in early 49s  . Heart disease Paternal Grandfather 48       Deceased  . Alcoholism Brother   . Hypertension Son     Social History   Socioeconomic History  . Marital status: Married    Spouse name: Not on file  . Number of children: Not on file  . Years of education: Not on file  . Highest education level: Not on file  Occupational History  . Not on file  Tobacco Use  . Smoking status: Former Smoker    Types: Cigarettes  . Smokeless tobacco: Never Used  . Tobacco comment: Quit>40 years  Substance and Sexual Activity  . Alcohol  use: Not Currently    Alcohol/week: 0.0 standard drinks  . Drug use: Never  . Sexual activity: Not on file  Other Topics Concern  . Not on file  Social History Narrative  . Not on file   Social Determinants of Health   Financial Resource Strain: Not on file  Food Insecurity: Not on file  Transportation Needs: Not on file  Physical Activity: Not on file  Stress: Not on file  Social Connections: Not on file  Intimate Partner Violence: Not on file    Past Medical History, Surgical history, Social history, and Family history were reviewed and updated as appropriate.   Please see review of systems for further details on the patient's review from today.   Objective:   Physical Exam:  There were no vitals taken for this visit.  Physical Exam Constitutional:      General: He is not in acute distress.    Appearance: He is well-developed.  Musculoskeletal:        General: No deformity.  Neurological:     Mental Status: He is alert and oriented to person, place, and time.     Coordination: Coordination normal.  Psychiatric:        Attention and Perception: Attention normal. He is attentive.        Mood and Affect: Mood normal. Mood is not anxious or depressed. Affect is not labile, blunt, angry or inappropriate.        Speech: Speech normal.        Behavior: Behavior normal.        Thought Content: Thought content normal. Thought content does not include homicidal or suicidal ideation. Thought content does not include homicidal or suicidal plan.        Cognition and Memory: Cognition normal.        Judgment: Judgment normal.     Comments: Insight is good. Good humor.     Lab Review:     Component Value Date/Time   NA 137 12/14/2020 1022   NA 141 11/28/2020 0841   K 4.2 12/14/2020 1022   CL 105 12/14/2020 1022   CO2 24 12/14/2020 1022   GLUCOSE 105 (H) 12/14/2020 1022   BUN 22 12/14/2020 1022   BUN 17 11/28/2020 0841   CREATININE 1.36 (H) 12/14/2020 1022    CREATININE 1.34 (H) 07/31/2020 1138   CALCIUM 8.4 (L) 12/14/2020 1022   PROT 5.9 (L) 11/28/2020 0841   ALBUMIN 4.2 11/28/2020 0841   AST 16 11/28/2020 0841   ALT 22 11/28/2020 0841   ALKPHOS 77 11/28/2020 0841   BILITOT 0.3 11/28/2020 0841   GFRNONAA 56 (L) 12/14/2020 1022   GFRAA 75 11/28/2020 0841       Component Value Date/Time   WBC 3.1 (L) 12/14/2020 1022   RBC 3.54 (L) 12/14/2020 1022   HGB 10.8 (L) 12/14/2020 1022   HGB 10.8 (L) 08/30/2020 1139   HCT 32.7 (L) 12/14/2020 1022   HCT 32.4 (L) 08/30/2020 1139   PLT 164 12/14/2020 1022   PLT 212 08/30/2020 1139   MCV 92.4 12/14/2020 1022   MCV 92 08/30/2020 1139   MCH 30.5 12/14/2020 1022   MCHC 33.0 12/14/2020 1022   RDW 13.2 12/14/2020 1022   RDW 12.0 08/30/2020 1139   LYMPHSABS 0.3 (L) 12/14/2020 1022   LYMPHSABS 0.5 (L) 08/30/2020 1139   MONOABS 0.7 12/14/2020 1022   EOSABS 0.1 12/14/2020 1022   EOSABS 0.3 08/30/2020 1139   BASOSABS 0.0 12/14/2020 1022   BASOSABS 0.1 08/30/2020 1139    Lithium Lvl  Date Value Ref Range Status  02/14/2021 0.6 0.6 - 1.2 mmol/L Final     No results found for: PHENYTOIN, PHENOBARB, VALPROATE, CBMZ   .res Assessment: Plan:    Bipolar disorder with moderate depression (HCC)  Generalized anxiety disorder  Lithium use   He has failed multiple other psychiatric medications and does not wish to have further med changes today.  Lithium level from January 2021 0.8 on 600 mg  daily. Gradually it's crept up.  Disc in detail it's relation to renal function and Cr has increased gradually. CR 1.35 01/18/21  Continue lithium 450 mg daily but tremor did not get better with reduction last visit. Lithium level 02/14/21 = 0.6  No worse off CBZ so far.  Stopped DT DDI with cardiac meds.  Call if sx worsen.  Counseled patient regarding potential benefits, risks, and side effects of lithium to include potential risk of lithium affecting thyroid and renal function.  Discussed need for periodic  lab monitoring to determine drug level and to assess for potential adverse effects.  Counseled patient regarding signs and symptoms of lithium toxicity and advised that they notify office immediately or seek urgent medical attention if experiencing these signs and symptoms.  Patient advised to contact office with any questions or concerns.  No other changes indicated.  He's failed multiple others.  4-6 mos  Meredith Staggers, MD, DFAPA   Please see After Visit Summary for patient specific instructions.  Future Appointments  Date Time Provider Department Center  07/02/2021 10:30 AM Sharlene Dory, DO [facility]  08/27/2021 11:00 AM Revankar, Aundra Dubin, MD [facility] None    No orders of the defined types were placed in this encounter.     -------------------------------

## 2021-05-11 ENCOUNTER — Other Ambulatory Visit: Payer: Self-pay | Admitting: Psychiatry

## 2021-05-11 DIAGNOSIS — F3132 Bipolar disorder, current episode depressed, moderate: Secondary | ICD-10-CM

## 2021-05-18 DIAGNOSIS — Z79899 Other long term (current) drug therapy: Secondary | ICD-10-CM

## 2021-05-18 HISTORY — DX: Other long term (current) drug therapy: Z79.899

## 2021-05-19 ENCOUNTER — Other Ambulatory Visit: Payer: Self-pay | Admitting: Family Medicine

## 2021-05-19 DIAGNOSIS — R351 Nocturia: Secondary | ICD-10-CM

## 2021-07-02 ENCOUNTER — Ambulatory Visit (INDEPENDENT_AMBULATORY_CARE_PROVIDER_SITE_OTHER): Payer: HMO | Admitting: Family Medicine

## 2021-07-02 ENCOUNTER — Encounter: Payer: Self-pay | Admitting: Family Medicine

## 2021-07-02 ENCOUNTER — Other Ambulatory Visit: Payer: Self-pay

## 2021-07-02 VITALS — BP 120/64 | HR 54 | Temp 98.1°F | Ht 67.5 in | Wt 161.5 lb

## 2021-07-02 DIAGNOSIS — Z0000 Encounter for general adult medical examination without abnormal findings: Secondary | ICD-10-CM

## 2021-07-02 DIAGNOSIS — Z125 Encounter for screening for malignant neoplasm of prostate: Secondary | ICD-10-CM | POA: Diagnosis not present

## 2021-07-02 DIAGNOSIS — J42 Unspecified chronic bronchitis: Secondary | ICD-10-CM | POA: Diagnosis not present

## 2021-07-02 LAB — CBC
HCT: 32.6 % — ABNORMAL LOW (ref 39.0–52.0)
Hemoglobin: 10.8 g/dL — ABNORMAL LOW (ref 13.0–17.0)
MCHC: 33.1 g/dL (ref 30.0–36.0)
MCV: 91.3 fl (ref 78.0–100.0)
Platelets: 207 10*3/uL (ref 150.0–400.0)
RBC: 3.58 Mil/uL — ABNORMAL LOW (ref 4.22–5.81)
RDW: 13.4 % (ref 11.5–15.5)
WBC: 5.4 10*3/uL (ref 4.0–10.5)

## 2021-07-02 LAB — COMPREHENSIVE METABOLIC PANEL
ALT: 22 U/L (ref 0–53)
AST: 16 U/L (ref 0–37)
Albumin: 4.3 g/dL (ref 3.5–5.2)
Alkaline Phosphatase: 55 U/L (ref 39–117)
BUN: 25 mg/dL — ABNORMAL HIGH (ref 6–23)
CO2: 27 mEq/L (ref 19–32)
Calcium: 9.1 mg/dL (ref 8.4–10.5)
Chloride: 107 mEq/L (ref 96–112)
Creatinine, Ser: 1.34 mg/dL (ref 0.40–1.50)
GFR: 54.01 mL/min — ABNORMAL LOW (ref >60.00)
Glucose, Bld: 93 mg/dL (ref 70–99)
Potassium: 5.2 mEq/L — ABNORMAL HIGH (ref 3.5–5.1)
Sodium: 139 mEq/L (ref 135–145)
Total Bilirubin: 0.3 mg/dL (ref 0.2–1.2)
Total Protein: 6.2 g/dL (ref 6.0–8.3)

## 2021-07-02 LAB — LIPID PANEL
Cholesterol: 103 mg/dL (ref 0–200)
HDL: 28.5 mg/dL — ABNORMAL LOW (ref >39.00)
LDL Cholesterol: 41 mg/dL (ref 0–99)
NonHDL: 74.94
Total CHOL/HDL Ratio: 4
Triglycerides: 168 mg/dL — ABNORMAL HIGH (ref 0.0–149.0)
VLDL: 33.6 mg/dL (ref 0.0–40.0)

## 2021-07-02 LAB — PSA: PSA: 1.01 ng/mL (ref 0.10–4.00)

## 2021-07-02 NOTE — Patient Instructions (Signed)
Give [place] 2-3 business days to get the results of your labs back.   Keep the diet clean and stay active.  OK to set up your 2nd covid booster shot.  Let us know if you need anything.

## 2021-07-02 NOTE — Progress Notes (Signed)
Chief Complaint  Patient presents with   Follow-up    Well Male Noah Grant is here for a complete physical.   His last physical was >1 year ago.  Current diet: in general, a "pretty healthy" diet.   Current exercise: walking Weight trend: stable Fatigue out of ordinary? Yes from RA Seat belt? Yes.    Health maintenance Shingrix- Yes Colonoscopy- Yes Tetanus- Yes Hep C- Yes Pneumonia vaccine- Yes  Past Medical History:  Diagnosis Date   Abdominal aortic atherosclerosis (HCC)    Anemia    likely of chronic disease   Angina pectoris (HCC) 08/30/2020   Arthritis    Barrett's esophagus without dysplasia 03/25/2019   Formatting of this note might be different from the original. EGD done in 02/2019. Due in 3 years.   Bipolar 1 disorder (HCC)    Bipolar 1 disorder, mixed, moderate (HCC) 08/16/2014   Bipolar affective disorder, currently depressed, moderate (HCC) 03/08/2020   Bipolar depression (HCC) 08/16/2014   BPH with urinary obstruction 12/29/2018   Brain ischemia 03/08/2020   Cardiac murmur 08/30/2020   Chest pain 09/06/2020   Chicken pox    Chronic bronchitis (HCC)    Cognitive complaints 03/08/2020   Coronary artery disease involving native coronary artery of native heart with angina pectoris (HCC) 09/06/2020   Depression    Disc degeneration, 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   Essential hypertension    GAD (generalized anxiety disorder) 11/02/2018   Gastroesophageal reflux disease 08/16/2014   Formatting of this note might be different from the original. Last Assessment & Plan:  Well controlled. Continue current medications.   Gastroesophageal reflux disease without esophagitis 08/16/2014   Formatting of this note might be different from the original. Last Assessment & Plan:  Well controlled. Continue current medications.   Herpes gingivostomatitis 02/10/2015   History of colon polyps 03/25/2019   Formatting of this note might be different from the original. tubular adenoma   History of stroke  11/10/2019   Hyperlipidemia    Hypertension    Ingrowing nail 03/12/2020   Labile hypertension 08/16/2014   Formatting of this note might be different from the original. Last Assessment & Plan:  Slightly above goal today secondary to pain. Will continue current regimen. Will check CMP today.   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 Concern over possible lichen planus. His dentist noticed lesions of his oral mucosa.  It was felt to be lichen planus.  He presents to discuss further.  Rarely does the area hurt.He smoked in the distant past EXAM shows several white patches on the buccal mucosa bilaterally.  No other concerning oral mucosal les   Long-term use of immunosuppressant medication 03/23/2018   Low testosterone 09/17/2016   Midline thoracic back pain 02/10/2015   Mixed hyperlipidemia 08/16/2014   Normochromic normocytic anemia 08/16/2014   PAD (peripheral artery disease) (HCC) 03/23/2019   Pain of right hip joint 01/19/2016   Rheumatic fever    Rheumatoid arthritis (HCC) 10/29/2018   Formatting of this note might be different from the original. Rheum:  Dr. Sharmon Revere Formerly on MTX - stopped due to mouth sores. Currently started on Arava.  Also on 5 mg prednisone x 3 months during run-in.   Right-sided chest wall pain 04/17/2019   Seasonal allergies 03/23/2018   Stage 3a chronic kidney disease (HCC) 07/21/2019   Statin intolerance 07/31/2020   Thoracic radiculopathy 06/16/2019   Tremor 06/16/2019   Vitamin  B12 deficiency    Injections   Vitamin D deficiency 09/17/2016     Past Surgical History:  Procedure Laterality Date   CLAVICLE SURGERY     Right, Hardware placed   CORONARY STENT INTERVENTION N/A 09/06/2020   Procedure: CORONARY STENT INTERVENTION;  Surgeon: Tonny Bollman, MD;  Location[facility] INVASIVE CV LAB;  Service: Cardiovascular;  Laterality: N/A;   LEFT HEART CATH AND CORONARY ANGIOGRAPHY N/A 09/06/2020   Procedure:  LEFT HEART CATH AND CORONARY ANGIOGRAPHY;  Surgeon: Tonny Bollman, MD;  [facility] INVASIVE CV LAB;  Service: Cardiovascular;  Laterality: N/A;   WISDOM TOOTH EXTRACTION      Medications  Current Outpatient Medications on File Prior to Visit  Medication Sig Dispense Refill   amLODipine (NORVASC) 10 MG tablet TAKE ONE (1) TABLET BY MOUTH EVERY DAY 90 tablet 2   aspirin EC 81 MG tablet Take 81 mg by mouth daily.     Cholecalciferol 50 MCG (2000 UT) CAPS Take 2,000 Units by mouth daily.      clopidogrel (PLAVIX) 75 MG tablet Take 1 tablet (75 mg total) by mouth daily. 90 tablet 3   Coenzyme Q10 (COQ10) 200 MG CAPS Take 200 mg by mouth daily.     COVID-19 mRNA vaccine, Moderna, 100 MCG/0.5ML injection INJECT AS DIRECTED .25 mL 0   ezetimibe (ZETIA) 10 MG tablet Take 10 mg by mouth daily.     hydrALAZINE (APRESOLINE) 50 MG tablet Take 50 mg by mouth 3 (three) times daily.     labetalol (NORMODYNE) 100 MG tablet Take 1 tablet (100 mg total) by mouth 2 (two) times daily. 180 tablet 2   leflunomide (ARAVA) 20 MG tablet Take 1 tab 5 days weekly.     lithium carbonate 150 MG capsule TAKE THREE CAPSULES BY MOUTH EVERY NIGHT AT BEDTIME 270 capsule 1   losartan (COZAAR) 100 MG tablet Take 100 mg by mouth daily.     Lysine HCl 1000 MG TABS Take 1,000 mg by mouth 2 (two) times daily.      nitroGLYCERIN (NITROSTAT) 0.4 MG SL tablet Place 0.4 mg under the tongue every 5 (five) minutes as needed for chest pain.     pantoprazole (PROTONIX) 40 MG tablet TAKE ONE TABLET BY MOUTH DAILY 90 tablet 2   simvastatin (ZOCOR) 5 MG tablet TAKE ONE TABLET BY MOUTH DAILY 30 tablet 1   tamsulosin (FLOMAX) 0.4 MG CAPS capsule TAKE ONE CAPSULE BY MOUTH DAILY 30 capsule 3    Allergies Allergies  Allergen Reactions   Hydroxychloroquine Other (See Comments)    Terrible Nightmares   Methotrexate Dermatitis    Developed Mouth Sores   Nsaids Dermatitis    Developed Mouth Blisters   Lovastatin Other (See Comments)     Caused abdominal pain that radiated to the back   Pravastatin Other (See Comments)    Caused abdominal pain that radiated to the back   Rosuvastatin Other (See Comments)    Caused abdominal pain that radiated to the back    Family History Family History  Problem Relation Age of Onset   Stroke Father 65       Deceased   Hypertension Father    Alcoholism Father    Heart disease Maternal Grandfather 83       Deceased   Alzheimer's disease Mother 28       Deceased in early 82s   Heart disease Paternal Grandfather 37       Deceased   Alcoholism Brother  Hypertension Son     Review of Systems: Constitutional:  no fevers Eye:  no recent significant change in vision Ears:  No changes in hearing Nose/Mouth/Throat:  no complaints of nasal congestion, no sore throat Cardiovascular: no chest pain Respiratory:  No shortness of breath Gastrointestinal:  No change in bowel habits GU:  No frequency Integumentary:  no new abnormal skin lesions reported Neurologic:  no headaches Endocrine:  denies unexplained weight changes  Exam BP 120/64   Pulse (!) 54   Temp 98.1 F (36.7 C) (Oral)   Ht 5' 7.5" (1.715 m)   Wt 161 lb 8 oz (73.3 kg)   SpO2 99%   BMI 24.92 kg/m  General:  well developed, well nourished, in no apparent distress Skin:  no significant moles, warts, or growths Head:  no masses, lesions, or tenderness Eyes:  pupils equal and round, sclera anicteric without injection Ears:  canals without lesions, TMs shiny without retraction, no obvious effusion, no erythema Nose:  nares patent, septum midline, mucosa normal Throat/Pharynx:  lips and gingiva without lesion; tongue and uvula midline; non-inflamed pharynx; no exudates or postnasal drainage Lungs:  clear to auscultation, breath sounds equal bilaterally, no respiratory distress Cardio:  regular rate and rhythm, no LE edema or bruits Rectal: Deferred GI: BS+, S, NT, ND, no masses or organomegaly Musculoskeletal:   symmetrical muscle groups noted without atrophy or deformity Neuro:  gait normal; deep tendon reflexes normal and symmetric Psych: well oriented with normal range of affect and appropriate judgment/insight  Assessment and Plan  Well adult exam - Plan: CBC, Comprehensive metabolic panel, Lipid panel  Screening for prostate cancer - Plan: PSA  Chronic bronchitis, unspecified chronic bronchitis type (HCC), Chronic   Well 70 y.o. male. Counseled on diet and exercise. Other orders as above. He plans to set up his 2nd covid booster.  Follow up in 6 mo.  The patient voiced understanding and agreement to the plan.  Jilda Roche [place], DO 07/02/21 10:45 AM

## 2021-07-03 ENCOUNTER — Other Ambulatory Visit: Payer: Self-pay | Admitting: Family Medicine

## 2021-07-03 DIAGNOSIS — E875 Hyperkalemia: Secondary | ICD-10-CM

## 2021-07-03 NOTE — Progress Notes (Signed)
bmp 

## 2021-07-04 ENCOUNTER — Ambulatory Visit: Payer: HMO | Attending: Internal Medicine

## 2021-07-04 DIAGNOSIS — Z23 Encounter for immunization: Secondary | ICD-10-CM

## 2021-07-04 NOTE — Progress Notes (Signed)
   Covid-19 Vaccination Clinic  Name:  Noah Grant    MRN: [id number] DOB: 1951-01-18  07/04/2021  [name] was observed post Covid-19 immunization for 15 minutes without incident. He was provided with Vaccine Information Sheet and instruction to access the V-Safe system.   [name] was instructed to call 911 with any severe reactions post vaccine: Difficulty breathing  Swelling of face and throat  A fast heartbeat  A bad rash all over body  Dizziness and weakness   Immunizations Administered     Name Date Dose VIS Date Route   Moderna Covid-19 Booster Vaccine 07/04/2021 10:25 AM 0.25 mL 09/27/2020 Intramuscular   Manufacturer: Moderna   Lot: [id number]   NDC: 93968-864-84

## 2021-07-05 ENCOUNTER — Other Ambulatory Visit: Payer: Self-pay | Admitting: Family Medicine

## 2021-07-05 ENCOUNTER — Other Ambulatory Visit (INDEPENDENT_AMBULATORY_CARE_PROVIDER_SITE_OTHER): Payer: HMO

## 2021-07-05 ENCOUNTER — Other Ambulatory Visit: Payer: Self-pay

## 2021-07-05 DIAGNOSIS — E875 Hyperkalemia: Secondary | ICD-10-CM | POA: Diagnosis not present

## 2021-07-05 LAB — BASIC METABOLIC PANEL
BUN: 26 mg/dL — ABNORMAL HIGH (ref 6–23)
CO2: 24 mEq/L (ref 19–32)
Calcium: 9.1 mg/dL (ref 8.4–10.5)
Chloride: 104 mEq/L (ref 96–112)
Creatinine, Ser: 1.54 mg/dL — ABNORMAL HIGH (ref 0.40–1.50)
GFR: 45.71 mL/min — ABNORMAL LOW (ref >60.00)
Glucose, Bld: 98 mg/dL (ref 70–99)
Potassium: 5.3 mEq/L — ABNORMAL HIGH (ref 3.5–5.1)
Sodium: 136 mEq/L (ref 135–145)

## 2021-07-05 MED ORDER — LOKELMA 10 G PO PACK: 10.0000 g | PACK | Freq: Every day | ORAL | 0 refills | Status: AC

## 2021-07-06 ENCOUNTER — Other Ambulatory Visit: Payer: Self-pay | Admitting: Family Medicine

## 2021-07-06 DIAGNOSIS — E875 Hyperkalemia: Secondary | ICD-10-CM

## 2021-07-06 NOTE — Progress Notes (Signed)
bmp 

## 2021-07-10 ENCOUNTER — Other Ambulatory Visit (INDEPENDENT_AMBULATORY_CARE_PROVIDER_SITE_OTHER): Payer: HMO

## 2021-07-10 ENCOUNTER — Other Ambulatory Visit (HOSPITAL_BASED_OUTPATIENT_CLINIC_OR_DEPARTMENT_OTHER): Payer: Self-pay

## 2021-07-10 ENCOUNTER — Other Ambulatory Visit: Payer: Self-pay

## 2021-07-10 DIAGNOSIS — E875 Hyperkalemia: Secondary | ICD-10-CM

## 2021-07-10 LAB — BASIC METABOLIC PANEL
BUN: 21 mg/dL (ref 6–23)
CO2: 25 mEq/L (ref 19–32)
Calcium: 9 mg/dL (ref 8.4–10.5)
Chloride: 107 mEq/L (ref 96–112)
Creatinine, Ser: 1.42 mg/dL (ref 0.40–1.50)
GFR: 50.38 mL/min — ABNORMAL LOW (ref >60.00)
Glucose, Bld: 73 mg/dL (ref 70–99)
Potassium: 4.9 mEq/L (ref 3.5–5.1)
Sodium: 140 mEq/L (ref 135–145)

## 2021-07-10 MED ORDER — COVID-19 MRNA VACC (MODERNA) 100 MCG/0.5ML IM SUSP
INTRAMUSCULAR | 0 refills | Status: DC
  Filled 2021-07-10: qty 0.25, 1d supply, fill #0

## 2021-08-17 ENCOUNTER — Other Ambulatory Visit: Payer: Self-pay | Admitting: Family Medicine

## 2021-08-17 ENCOUNTER — Other Ambulatory Visit: Payer: Self-pay | Admitting: Cardiology

## 2021-08-20 ENCOUNTER — Other Ambulatory Visit (HOSPITAL_BASED_OUTPATIENT_CLINIC_OR_DEPARTMENT_OTHER): Payer: Self-pay

## 2021-08-27 ENCOUNTER — Ambulatory Visit: Payer: HMO | Admitting: Cardiology

## 2021-09-03 ENCOUNTER — Other Ambulatory Visit (HOSPITAL_BASED_OUTPATIENT_CLINIC_OR_DEPARTMENT_OTHER): Payer: Self-pay

## 2021-09-03 MED ORDER — INFLUENZA VAC A&B SA ADJ QUAD 0.5 ML IM PRSY
PREFILLED_SYRINGE | INTRAMUSCULAR | 0 refills | Status: DC
  Filled 2021-09-03: qty 0.5, 1d supply, fill #0

## 2021-09-14 ENCOUNTER — Telehealth: Payer: Self-pay | Admitting: Cardiology

## 2021-09-14 ENCOUNTER — Telehealth: Payer: Self-pay | Admitting: Family Medicine

## 2021-09-14 NOTE — Telephone Encounter (Signed)
Lupita Leash, RN from [facility] is calling about chronic special needs program, she needs to verify that patient did have a diagnosis of heart failure or diabetes.  The answer can be left on her private voicemail [phone].  Please advice.

## 2021-09-14 NOTE — Telephone Encounter (Signed)
Lupita Leash from [facility] [employer] is calling to verify if pt has a diagnosis of either or both Heart Failure or Diabetes. Lupita Leash said she is on her phone a lot but a VM can be left of the phone and it's not a Facilities manager

## 2021-09-14 NOTE — Telephone Encounter (Signed)
Advised that pt does not have either dx on his record.

## 2021-09-17 NOTE — Telephone Encounter (Signed)
Called back to verify that neither of these are listed under his diagnosis. [facility] had spoken to his cardiologist and received that information already.

## 2021-09-25 ENCOUNTER — Other Ambulatory Visit: Payer: Self-pay

## 2021-10-09 ENCOUNTER — Other Ambulatory Visit: Payer: Self-pay

## 2021-10-09 ENCOUNTER — Ambulatory Visit: Payer: PPO | Admitting: Cardiology

## 2021-10-09 ENCOUNTER — Encounter: Payer: Self-pay | Admitting: Cardiology

## 2021-10-09 VITALS — BP 132/58 | HR 52 | Ht 67.0 in | Wt 162.1 lb

## 2021-10-09 DIAGNOSIS — I25119 Atherosclerotic heart disease of native coronary artery with unspecified angina pectoris: Secondary | ICD-10-CM

## 2021-10-09 DIAGNOSIS — E782 Mixed hyperlipidemia: Secondary | ICD-10-CM

## 2021-10-09 DIAGNOSIS — I10 Essential (primary) hypertension: Secondary | ICD-10-CM

## 2021-10-09 DIAGNOSIS — I700 Atherosclerosis of aorta: Secondary | ICD-10-CM | POA: Diagnosis not present

## 2021-10-09 NOTE — Progress Notes (Signed)
Cardiology Office Note:    Date:  10/09/2021   ID:  Noah Grant, DOB 11-May-1951, MRN [id number]  PCP:  Sharlene Dory, DO  Cardiologist:  Garwin Brothers, MD   Referring MD: Sharlene Dory*    ASSESSMENT:    1. Abdominal aortic atherosclerosis (HCC)   2. Coronary artery disease involving native coronary artery of native heart with angina pectoris (HCC)   3. Essential hypertension   4. Mixed hyperlipidemia    PLAN:    In order of problems listed above:  Coronary artery disease: Secondary prevention stressed with the patient.  Importance of compliance with diet medication stressed any vocalized understanding.  He is ambulating to the best of his ability. Essential hypertension: Blood pressure stable and diet was emphasized.  Lifestyle modification urged. Mixed dyslipidemia: Statin intolerant.  On Zetia and tolerating well and lipids done recently and they are fine.  I congratulated him about this. Patient will be seen in follow-up appointment in 6 months or earlier if the patient has any concerns    Medication Adjustments/Labs and Tests Ordered: Current medicines are reviewed at length with the patient today.  Concerns regarding medicines are outlined above.  No orders of the defined types were placed in this encounter.  No orders of the defined types were placed in this encounter.    No chief complaint on file.    History of Present Illness:    Noah Grant is a 70 y.o. male.  Patient has past medical history of coronary artery disease, abdominal aortic atherosclerosis, essential hypertension and dyslipidemia.  He has rheumatoid arthritis which limits his ambulation.  He denies any chest pain orthopnea or PND.  He does the best to ambulate.  He is now on Remicade for rheumatoid arthritis and he just started this medication.  At the time of my evaluation, the patient is alert awake oriented and in no distress.  Past Medical History:  Diagnosis  Date   Abdominal aortic atherosclerosis (HCC)    Anemia    likely of chronic disease   Angina pectoris (HCC) 08/30/2020   Arthritis    Barrett's esophagus without dysplasia 03/25/2019   Formatting of this note might be different from the original. EGD done in 02/2019. Due in 3 years.   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 Last Assessment & Plan:  Well controlled. Continue current medications.   Gastroesophageal reflux disease without esophagitis 08/16/2014   Formatting of this note might be different from the original. Last Assessment & Plan:  Well controlled. Continue current medications.   Herpes gingivostomatitis 02/10/2015   History of colon polyps 03/25/2019   Formatting of this note might be different from the original. tubular adenoma   History of stroke 11/10/2019   Hyperlipidemia  Hypertension    Ingrowing nail 03/12/2020   Labile hypertension 08/16/2014   Formatting of this note might be different from the original. Last Assessment & Plan:  Slightly above goal today secondary to pain. Will continue current regimen. Will check CMP today.   Lichen planus [id number]   Last Assessment & Plan:  Formatting of this note might be different from the original. Concern over possible  lichen planus. His dentist noticed lesions of his oral mucosa.  It was felt to be lichen planus.  He presents to discuss further.  Rarely does the area hurt.He smoked in the distant past EXAM shows several white patches on the buccal mucosa bilaterally.  No other concerning oral mucosal les   Long-term use of immunosuppressant medication 03/23/2018   Low testosterone 09/17/2016   Midline thoracic back pain 02/10/2015   Mixed hyperlipidemia 08/16/2014   Normochromic normocytic anemia 08/16/2014   PAD (peripheral artery disease) ([place]) 03/23/2019   Pain of right hip joint 01/19/2016   Rheumatic fever    Rheumatoid arthritis ([place]) 10/29/2018   Formatting of this note might be different from the original. Rheum:  Dr. Gerilyn Nestle Formerly on MTX - stopped due to mouth sores. Currently started on Arava.  Also on 5 mg prednisone x 3 months during run-in.   Right-sided chest wall pain 04/17/2019   Seasonal allergies 03/23/2018   Stage 3a chronic kidney disease ([place]) 07/21/2019   Statin intolerance 07/31/2020   Thoracic radiculopathy 06/16/2019   Tremor 06/16/2019   Vitamin B12 deficiency    Injections   Vitamin D deficiency 09/17/2016    Past Surgical History:  Procedure Laterality Date   CLAVICLE SURGERY     Right, Hardware placed   CORONARY STENT INTERVENTION N/A 09/06/2020   Procedure: CORONARY STENT INTERVENTION;  Surgeon: Sherren Mocha, MD;  Location: [facility];  Service: Cardiovascular;  Laterality: N/A;   LEFT HEART CATH AND CORONARY ANGIOGRAPHY N/A 09/06/2020   Procedure: LEFT HEART CATH AND CORONARY ANGIOGRAPHY;  Surgeon: Sherren Mocha, MD;  Location: [facility];  Service: Cardiovascular;  Laterality: N/A;   WISDOM TOOTH EXTRACTION      Current Medications: Current Meds  Medication Sig   amLODipine (NORVASC) 10 MG tablet TAKE ONE TABLET BY MOUTH DAILY   aspirin EC 81 MG tablet Take 81 mg by mouth daily.   Cholecalciferol 50 MCG (2000 UT) CAPS Take 2,000 Units by mouth daily.     clopidogrel (PLAVIX) 75 MG tablet TAKE ONE TABLET BY MOUTH DAILY   Coenzyme Q10 (COQ10) 200 MG CAPS Take 200 mg by mouth daily.   ezetimibe (ZETIA) 10 MG tablet TAKE ONE TABLET BY MOUTH DAILY   hydrALAZINE (APRESOLINE) 50 MG tablet Take 50 mg by mouth 3 (three) times daily.   inFLIXimab (REMICADE IV) Inject 300 mg into the vein every 8 (eight) weeks.   labetalol (NORMODYNE) 100 MG tablet Take 1 tablet (100 mg total) by mouth 2 (two) times daily.   leflunomide (ARAVA) 20 MG tablet Take 20 mg by mouth every Monday, Tuesday, Wednesday, Thursday, and Friday.   lithium carbonate 150 MG capsule TAKE THREE CAPSULES BY MOUTH EVERY NIGHT AT BEDTIME   losartan (COZAAR) 100 MG tablet Take 100 mg by mouth daily.   Lysine HCl 1000 MG TABS Take 1,000 mg by mouth 2 (two) times daily.    nitroGLYCERIN (NITROSTAT) 0.4 MG SL tablet Place 0.4 mg under the tongue every 5 (five) minutes as needed for chest pain.   pantoprazole (PROTONIX) 40 MG  tablet TAKE ONE TABLET BY MOUTH DAILY   predniSONE (DELTASONE) 5 MG tablet Take 1 tablet by mouth 2 (two) times daily.   simvastatin (ZOCOR) 5 MG tablet TAKE ONE TABLET BY MOUTH DAILY     Allergies:   Hydroxychloroquine, Methotrexate, Nsaids, Isosorbide, Lovastatin, Pravastatin, Rosuvastatin, and Sulfasalazine   Social History   Socioeconomic History   Marital status: Married    Spouse name: Not on file   Number of children: Not on file   Years of education: Not on file   Highest education level: Not on file  Occupational History   Not on file  Tobacco Use   Smoking status: Former    Types: Cigarettes   Smokeless tobacco: Never   Tobacco comments:    Quit>40 years  Substance and Sexual Activity   Alcohol use: Not Currently    Alcohol/week: 0.0 standard drinks   Drug use: Never   Sexual activity: Not on file  Other Topics Concern   Not on file  Social History Narrative   Not on file   Social Determinants of Health   Financial Resource Strain: Not on  file  Food Insecurity: Not on file  Transportation Needs: Not on file  Physical Activity: Not on file  Stress: Not on file  Social Connections: Not on file     Family History: The patient's family history includes Alcoholism in his brother and father; Alzheimer's disease (age of onset: 59) in his mother; Heart disease (age of onset: 3) in his paternal grandfather; Heart disease (age of onset: 49) in his maternal grandfather; Hypertension in his father and son; Stroke (age of onset: 26) in his father.  ROS:   Please see the history of present illness.    All other systems reviewed and are negative.  EKGs/Labs/Other Studies Reviewed:    The following studies were reviewed today: I discussed my findings with the patient at length.  EKG reveals sinus rhythm bifascicular block and nonspecific ST-T changes   Recent Labs: 07/02/2021: ALT 22; Hemoglobin 10.8; Platelets 207.0 07/10/2021: BUN 21; Creatinine, Ser 1.42; Potassium 4.9; Sodium 140  Recent Lipid Panel    Component Value Date/Time   CHOL 103 07/02/2021 1048   CHOL 109 11/28/2020 0841   TRIG 168.0 (H) 07/02/2021 1048   HDL 28.50 (L) 07/02/2021 1048   HDL 33 (L) 11/28/2020 0841   CHOLHDL 4 07/02/2021 1048   VLDL 33.6 07/02/2021 1048   LDLCALC 41 07/02/2021 1048   LDLCALC 57 11/28/2020 0841   LDLDIRECT 99.0 12/01/2019 1056    Physical Exam:    VS:  BP (!) 132/58   Pulse (!) 52   Ht 5\' 7"  (1.702 m)   Wt 162 lb 1.9 oz (73.5 kg)   BMI 25.39 kg/m     Wt Readings from Last 3 Encounters:  10/09/21 162 lb 1.9 oz (73.5 kg)  07/02/21 161 lb 8 oz (73.3 kg)  03/19/21 160 lb 6 oz (72.7 kg)     GEN: Patient is in no acute distress HEENT: Normal NECK: No JVD; No carotid bruits LYMPHATICS: No lymphadenopathy CARDIAC: Hear sounds regular, 2/6 systolic murmur at the apex. RESPIRATORY:  Clear to auscultation without rales, wheezing or rhonchi  ABDOMEN: Soft, non-tender, non-distended MUSCULOSKELETAL:  No edema; No deformity   SKIN: Warm and dry NEUROLOGIC:  Alert and oriented x 3 PSYCHIATRIC:  Normal affect   Signed, Jenean Lindau, MD  10/09/2021 11:00 AM     [facility]

## 2021-10-09 NOTE — Patient Instructions (Signed)

## 2021-10-10 ENCOUNTER — Ambulatory Visit: Payer: PPO | Admitting: Psychiatry

## 2021-10-10 ENCOUNTER — Encounter: Payer: Self-pay | Admitting: Psychiatry

## 2021-10-10 DIAGNOSIS — Z79899 Other long term (current) drug therapy: Secondary | ICD-10-CM

## 2021-10-10 DIAGNOSIS — F411 Generalized anxiety disorder: Secondary | ICD-10-CM | POA: Diagnosis not present

## 2021-10-10 DIAGNOSIS — F3132 Bipolar disorder, current episode depressed, moderate: Secondary | ICD-10-CM | POA: Diagnosis not present

## 2021-10-10 DIAGNOSIS — R251 Tremor, unspecified: Secondary | ICD-10-CM

## 2021-10-10 MED ORDER — LITHIUM CARBONATE 150 MG PO CAPS: 450.0000 mg | ORAL_CAPSULE | Freq: Every day | ORAL | 1 refills | Status: DC

## 2021-10-10 NOTE — Progress Notes (Signed)
Noah Grant [id number] March 18, 1951 70 y.o.  Subjective:   Patient ID:  Noah Grant is a 70 y.o. (DOB [birth year]) male.  Chief Complaint:  Chief Complaint  Patient presents with   Follow-up   Depression   Anxiety    Depression        Associated symptoms include no decreased concentration and no suicidal ideas.  Past medical history includes anxiety.   Anxiety Symptoms include nervous/anxious behavior. Patient reports no chest pain, confusion, decreased concentration or suicidal ideas.    Medication Refill Associated symptoms include arthralgias. Pertinent negatives include no chest pain or weakness.  Noah Grant presents to the office today for follow-up of bipolar, GAD, poor STM.  seen May 18, 2019 and 11/17/2019 without med changes.    05/17/2020 appointment with the following noted: Word recall problems with neuropsych testing by Dr. Orie Fisherman inconclusive results.  Test reviewed. Still tremor can interfere with eating. Nocturia Dt overactive bladder. Overall mentally ok but too many medical concerns with RA and muscle pain.   Chronic pain issues ongoing.   Htn controlled.  Plan: Lithium level from January 2021 0.8 on 600 mg daily. Gradually it's crept up.  Disc in detail it's relation to renal function and Cr has increased gradually. BC tremor & jerks reduce to 450 mg daily.  02/12/2021 appointment with the following noted: Reduced ithium with no change in tremor. End of December, CVD stent placement and reaction to contrast dye.   Cardiologists ask if he can come off CBZ.   DT DDI. In the last year has felet mentally better than ever.   Plan: Reduce carbamazepine by 1/2 tablet every week until off of it.  04/09/2021 appointment with the following noted: Off CBZ and didn't have any problems.  No worse for mood or anxiety.  Never slept good in life.  Melatonin caused dizziness. Checked lithium level. 06  No change in tremors in a long time.  Maninly noticeable  with fine motor control. Depression is somewhat chronic and waxes and wanes dependent on health issues.  Avoids stressors if possible.  Plan no med changes  10/10/21 appt noted: No Covid.  Hx TIA's.  Tremor seems a little worse but also has RA and started infusions last week. Continues lithium 450 mg HS as only psych med.  Patient reports stable mood and denie irritable moods except as noted.  Patient denies any recent difficulty with anxiety except situational.  Patient denies unusual difficulty with sleep initiation or maintenance except awakens with pain chronically. Denies appetite disturbance.  Patient reports that energy and motivation have been good.  Patient denies any difficulty with concentration  But history of word finding from ministrokes..  Patient denies any suicidal ideation.  Has seen nephorologists and rheumatologists and neurologist, [facility].   Lives in [place] now  Previous psych med trials are extensive and include meds for anxiety and mood.  These include  clonidine, buspirone,  Abilify 5 mg,  olanzapine, Geodon, Depakote, CBZ, gabapentin, lamotrigine, bupropion, Strattera,  Prozac, Serzone 700 mg daily,sertraline,duloxetine, Pristiq which caused rage, mirtazapine, Pamelor, amitriptyline 450 mg/day, Ritalin, Provigil,  temazepam,  Ambien, ProSom, trazodone which was ineffective.    Cerefolin NAC,   Review of Systems:  Review of Systems  Cardiovascular:  Negative for chest pain.  Musculoskeletal:  Positive for arthralgias and gait problem.  Neurological:  Positive for tremors. Negative for weakness.       Occ jerks  Psychiatric/Behavioral:  Positive for depression and dysphoric mood.  Negative for agitation, behavioral problems, confusion, decreased concentration, hallucinations, self-injury, sleep disturbance and suicidal ideas. The patient is nervous/anxious. The patient is not hyperactive.    Medications: I have reviewed the patient's current  medications.  Current Outpatient Medications  Medication Sig Dispense Refill   amLODipine (NORVASC) 10 MG tablet TAKE ONE TABLET BY MOUTH DAILY 30 tablet 0   aspirin EC 81 MG tablet Take 81 mg by mouth daily.     Cholecalciferol 50 MCG (2000 UT) CAPS Take 2,000 Units by mouth daily.      clopidogrel (PLAVIX) 75 MG tablet TAKE ONE TABLET BY MOUTH DAILY 90 tablet 1   Coenzyme Q10 (COQ10) 200 MG CAPS Take 200 mg by mouth daily.     ezetimibe (ZETIA) 10 MG tablet TAKE ONE TABLET BY MOUTH DAILY 30 tablet 1   hydrALAZINE (APRESOLINE) 50 MG tablet Take 50 mg by mouth 3 (three) times daily.     inFLIXimab (REMICADE IV) Inject 300 mg into the vein every 8 (eight) weeks.     labetalol (NORMODYNE) 100 MG tablet Take 1 tablet (100 mg total) by mouth 2 (two) times daily. 180 tablet 2   leflunomide (ARAVA) 20 MG tablet Take 20 mg by mouth every Monday, Tuesday, Wednesday, Thursday, and Friday.     losartan (COZAAR) 100 MG tablet Take 100 mg by mouth daily.     Lysine HCl 1000 MG TABS Take 1,000 mg by mouth 2 (two) times daily.      nitroGLYCERIN (NITROSTAT) 0.4 MG SL tablet Place 0.4 mg under the tongue every 5 (five) minutes as needed for chest pain.     pantoprazole (PROTONIX) 40 MG tablet TAKE ONE TABLET BY MOUTH DAILY 90 tablet 2   predniSONE (DELTASONE) 5 MG tablet Take 1 tablet by mouth 2 (two) times daily.     simvastatin (ZOCOR) 5 MG tablet TAKE ONE TABLET BY MOUTH DAILY 30 tablet 1   lithium carbonate 150 MG capsule Take 3 capsules (450 mg total) by mouth daily. 270 capsule 1   No current facility-administered medications for this visit.    Medication Side Effects: None  Allergies:  Allergies  Allergen Reactions   Hydroxychloroquine Other (See Comments)    Terrible Nightmares   Methotrexate Dermatitis    Developed Mouth Sores   Nsaids Dermatitis    Developed Mouth Blisters   Isosorbide Other (See Comments)    Made him feel weak   Lovastatin Other (See Comments)    Caused abdominal  pain that radiated to the back   Pravastatin Other (See Comments)    Caused abdominal pain that radiated to the back   Rosuvastatin Other (See Comments)    Caused abdominal pain that radiated to the back   Sulfasalazine Nausea Only    Past Medical History:  Diagnosis Date   Abdominal aortic atherosclerosis ([place])    Anemia    likely of chronic disease   Angina pectoris ([place]) 08/30/2020   Arthritis    Barrett's esophagus without dysplasia 03/25/2019   Formatting of this note might be different from the original. EGD done in 02/2019. Due in 3 years.   Bipolar 1 disorder ([place])    Bipolar 1 disorder, mixed, moderate ([place]) 08/16/2014   Bipolar affective disorder, currently depressed, moderate ([place]) 03/08/2020   Bipolar depression ([place]) 08/16/2014   BPH with urinary obstruction 12/29/2018   Brain ischemia 03/08/2020   Cardiac murmur 08/30/2020   Chest pain 09/06/2020   Chicken pox    Chronic bronchitis ([place])  Cognitive complaints 03/08/2020   Coronary artery disease involving native coronary artery of native heart with angina pectoris ([place]) 09/06/2020   Depression    Disc degeneration, 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   Drug therapy 05/18/2021   Essential hypertension    GAD (generalized anxiety disorder) 11/02/2018   Gastroesophageal reflux disease 08/16/2014   Formatting of this note might be different from the original. Last Assessment & Plan:  Well controlled. Continue current medications.   Gastroesophageal reflux disease without esophagitis 08/16/2014   Formatting of this note might be different from the original. Last Assessment & Plan:  Well controlled. Continue current medications.   Herpes gingivostomatitis 02/10/2015   History of colon polyps 03/25/2019   Formatting of this note might be different from the original. tubular adenoma   History of stroke 11/10/2019   Hyperlipidemia    Hypertension    Ingrowing nail 03/12/2020   Labile hypertension 08/16/2014   Formatting of this note might be different from  the original. Last Assessment & Plan:  Slightly above goal today secondary to pain. Will continue current regimen. Will check CMP today.   Lichen planus [id number]   Last Assessment & Plan:  Formatting of this note might be different from the original. Concern over possible lichen planus. His dentist noticed lesions of his oral mucosa.  It was felt to be lichen planus.  He presents to discuss further.  Rarely does the area hurt.He smoked in the distant past EXAM shows several white patches on the buccal mucosa bilaterally.  No other concerning oral mucosal les   Long-term use of immunosuppressant medication 03/23/2018   Low testosterone 09/17/2016   Midline thoracic back pain 02/10/2015   Mixed hyperlipidemia 08/16/2014   Normochromic normocytic anemia 08/16/2014   PAD (peripheral artery disease) ([place]) 03/23/2019   Pain of right hip joint 01/19/2016   Rheumatic fever    Rheumatoid arthritis ([place]) 10/29/2018   Formatting of this note might be different from the original. Rheum:  Dr. Gerilyn Nestle Formerly on MTX - stopped due to mouth sores. Currently started on Arava.  Also on 5 mg prednisone x 3 months during run-in.   Right-sided chest wall pain 04/17/2019   Seasonal allergies 03/23/2018   Stage 3a chronic kidney disease ([place]) 07/21/2019   Statin intolerance 07/31/2020   Thoracic radiculopathy 06/16/2019   Tremor 06/16/2019   Vitamin B12 deficiency    Injections   Vitamin D deficiency 09/17/2016    Family History  Problem Relation Age of Onset   Stroke Father 23       Deceased   Hypertension Father    Alcoholism Father    Heart disease Maternal Grandfather 55       Deceased   Alzheimer's disease Mother 46       Deceased in early 39s   Heart disease Paternal Grandfather 59       Deceased   Alcoholism Brother    Hypertension Son     Social History   Socioeconomic History   Marital status: Married    Spouse name: Not on file   Number of children: Not on file   Years of education: Not on file    Highest education level: Not on file  Occupational History   Not on file  Tobacco Use   Smoking status: Former    Types: Cigarettes   Smokeless tobacco: Never   Tobacco comments:    Quit>40 years  Substance and Sexual Activity   Alcohol use: Not Currently    Alcohol/week: 0.0 standard drinks  Drug use: Never   Sexual activity: Not on file  Other Topics Concern   Not on file  Social History Narrative   Not on file   Social Determinants of Health   Financial Resource Strain: Not on file  Food Insecurity: Not on file  Transportation Needs: Not on file  Physical Activity: Not on file  Stress: Not on file  Social Connections: Not on file  Intimate Partner Violence: Not on file    Past Medical History, Surgical history, Social history, and Family history were reviewed and updated as appropriate.   Please see review of systems for further details on the patient's review from today.   Objective:   Physical Exam:  There were no vitals taken for this visit.  Physical Exam Constitutional:      General: He is not in acute distress.    Appearance: He is well-developed.  Musculoskeletal:        General: No deformity.  Neurological:     Mental Status: He is alert and oriented to person, place, and time.     Coordination: Coordination normal.  Psychiatric:        Attention and Perception: Attention normal. He is attentive.        Mood and Affect: Mood normal. Mood is not anxious or depressed. Affect is not labile, blunt, angry or inappropriate.        Speech: Speech normal.        Behavior: Behavior normal.        Thought Content: Thought content normal. Thought content does not include homicidal or suicidal ideation. Thought content does not include homicidal or suicidal plan.        Cognition and Memory: Cognition normal.        Judgment: Judgment normal.     Comments: Insight is good. Good humor.    Lab Review:     Component Value Date/Time   NA 140 07/10/2021  1022   NA 141 11/28/2020 0841   K 4.9 07/10/2021 1022   CL 107 07/10/2021 1022   CO2 25 07/10/2021 1022   GLUCOSE 73 07/10/2021 1022   BUN 21 07/10/2021 1022   BUN 17 11/28/2020 0841   CREATININE 1.42 07/10/2021 1022   CREATININE 1.34 (H) 07/31/2020 1138   CALCIUM 9.0 07/10/2021 1022   PROT 6.2 07/02/2021 1048   PROT 5.9 (L) 11/28/2020 0841   ALBUMIN 4.3 07/02/2021 1048   ALBUMIN 4.2 11/28/2020 0841   AST 16 07/02/2021 1048   ALT 22 07/02/2021 1048   ALKPHOS 55 07/02/2021 1048   BILITOT 0.3 07/02/2021 1048   BILITOT 0.3 11/28/2020 0841   GFRNONAA 56 (L) 12/14/2020 1022   GFRAA 75 11/28/2020 0841       Component Value Date/Time   WBC 5.4 07/02/2021 1048   RBC 3.58 (L) 07/02/2021 1048   HGB 10.8 (L) 07/02/2021 1048   HGB 10.8 (L) 08/30/2020 1139   HCT 32.6 (L) 07/02/2021 1048   HCT 32.4 (L) 08/30/2020 1139   PLT 207.0 07/02/2021 1048   PLT 212 08/30/2020 1139   MCV 91.3 07/02/2021 1048   MCV 92 08/30/2020 1139   MCH 30.5 12/14/2020 1022   MCHC 33.1 07/02/2021 1048   RDW 13.4 07/02/2021 1048   RDW 12.0 08/30/2020 1139   LYMPHSABS 0.3 (L) 12/14/2020 1022   LYMPHSABS 0.5 (L) 08/30/2020 1139   MONOABS 0.7 12/14/2020 1022   EOSABS 0.1 12/14/2020 1022   EOSABS 0.3 08/30/2020 1139   BASOSABS 0.0 12/14/2020 1022  BASOSABS 0.1 08/30/2020 1139    Lithium Lvl  Date Value Ref Range Status  02/14/2021 0.6 0.6 - 1.2 mmol/L Final     No results found for: PHENYTOIN, PHENOBARB, VALPROATE, CBMZ   .res Assessment: Plan:    Bipolar disorder with moderate depression ([place]) - Plan: Lithium level, lithium carbonate 150 MG capsule  Generalized anxiety disorder  Lithium use - Plan: Lithium level  Tremor   He has failed multiple other psychiatric medications and does not wish to have further med changes today. Only current psych med is lithium 450 mg daily .  Lithium level from January 2021 0.8 on 600 mg daily. Gradually it's crept up.  Disc in detail it's relation to renal  function and Cr has increased gradually. CR 1.35 01/18/21  Continue lithium 450 mg daily but tremor did not get better with reduction last visit. Lithium level 02/14/21 = 0.6 Repeat level.  No worse off CBZ so far.  Stopped DT DDI with cardiac meds.  Call if sx worsen.  Counseled patient regarding potential benefits, risks, and side effects of lithium to include potential risk of lithium affecting thyroid and renal function.  Discussed need for periodic lab monitoring to determine drug level and to assess for potential adverse effects.  Counseled patient regarding signs and symptoms of lithium toxicity and advised that they notify office immediately or seek urgent medical attention if experiencing these signs and symptoms.  Patient advised to contact office with any questions or concerns.  Disc risk mania from occ use of prednisone.  He does feel edgy on it.  Can't use propranolol for tremor DT multiple BP meds.  No changes indicated.  He's failed multiple others.  6 mos  Lynder Parents, MD, DFAPA   Please see After Visit Summary for patient specific instructions.  Future Appointments  Date Time Provider [place]  01/02/2022 10:30 AM [name], DO [facility]  04/02/2022 11:00 AM Revankar, Reita Cliche, MD [facility] None    Orders Placed This Encounter  Procedures   Lithium level       -------------------------------

## 2021-10-17 ENCOUNTER — Other Ambulatory Visit: Payer: Self-pay | Admitting: Cardiology

## 2021-10-17 ENCOUNTER — Other Ambulatory Visit: Payer: Self-pay | Admitting: Family Medicine

## 2021-10-18 DIAGNOSIS — F3132 Bipolar disorder, current episode depressed, moderate: Secondary | ICD-10-CM | POA: Diagnosis not present

## 2021-10-18 DIAGNOSIS — Z79899 Other long term (current) drug therapy: Secondary | ICD-10-CM | POA: Diagnosis not present

## 2021-10-19 LAB — LITHIUM LEVEL: Lithium Lvl: 0.6 mmol/L (ref 0.6–1.2)

## 2021-11-16 ENCOUNTER — Other Ambulatory Visit: Payer: Self-pay | Admitting: Family Medicine

## 2021-11-27 DIAGNOSIS — M05742 Rheumatoid arthritis with rheumatoid factor of left hand without organ or systems involvement: Secondary | ICD-10-CM | POA: Diagnosis not present

## 2021-11-27 DIAGNOSIS — M05741 Rheumatoid arthritis with rheumatoid factor of right hand without organ or systems involvement: Secondary | ICD-10-CM | POA: Diagnosis not present

## 2021-12-12 ENCOUNTER — Encounter (HOSPITAL_BASED_OUTPATIENT_CLINIC_OR_DEPARTMENT_OTHER): Payer: Self-pay | Admitting: Emergency Medicine

## 2021-12-12 ENCOUNTER — Other Ambulatory Visit: Payer: Self-pay

## 2021-12-12 ENCOUNTER — Emergency Department (HOSPITAL_BASED_OUTPATIENT_CLINIC_OR_DEPARTMENT_OTHER)
Admission: EM | Admit: 2021-12-12 | Discharge: 2021-12-12 | Disposition: A | Discharge status: Home / Self Care | Payer: PPO | Attending: Emergency Medicine | Admitting: Emergency Medicine

## 2021-12-12 DIAGNOSIS — Z79899 Other long term (current) drug therapy: Secondary | ICD-10-CM | POA: Insufficient documentation

## 2021-12-12 DIAGNOSIS — I10 Essential (primary) hypertension: Secondary | ICD-10-CM | POA: Insufficient documentation

## 2021-12-12 DIAGNOSIS — Z7982 Long term (current) use of aspirin: Secondary | ICD-10-CM | POA: Insufficient documentation

## 2021-12-12 DIAGNOSIS — H1033 Unspecified acute conjunctivitis, bilateral: Secondary | ICD-10-CM

## 2021-12-12 DIAGNOSIS — I2510 Atherosclerotic heart disease of native coronary artery without angina pectoris: Secondary | ICD-10-CM | POA: Insufficient documentation

## 2021-12-12 DIAGNOSIS — X58XXXA Exposure to other specified factors, initial encounter: Secondary | ICD-10-CM | POA: Insufficient documentation

## 2021-12-12 DIAGNOSIS — Z20822 Contact with and (suspected) exposure to covid-19: Secondary | ICD-10-CM | POA: Insufficient documentation

## 2021-12-12 DIAGNOSIS — T1501XA Foreign body in cornea, right eye, initial encounter: Secondary | ICD-10-CM | POA: Diagnosis not present

## 2021-12-12 DIAGNOSIS — S0501XA Injury of conjunctiva and corneal abrasion without foreign body, right eye, initial encounter: Secondary | ICD-10-CM

## 2021-12-12 DIAGNOSIS — H109 Unspecified conjunctivitis: Secondary | ICD-10-CM | POA: Diagnosis not present

## 2021-12-12 DIAGNOSIS — T1591XA Foreign body on external eye, part unspecified, right eye, initial encounter: Secondary | ICD-10-CM

## 2021-12-12 DIAGNOSIS — S0502XA Injury of conjunctiva and corneal abrasion without foreign body, left eye, initial encounter: Secondary | ICD-10-CM | POA: Diagnosis not present

## 2021-12-12 DIAGNOSIS — S0591XA Unspecified injury of right eye and orbit, initial encounter: Secondary | ICD-10-CM | POA: Diagnosis present

## 2021-12-12 LAB — RESP PANEL BY RT-PCR (FLU A&B, COVID) ARPGX2
Influenza A by PCR: NEGATIVE
Influenza B by PCR: NEGATIVE
SARS Coronavirus 2 by RT PCR: NEGATIVE

## 2021-12-12 MED ORDER — ERYTHROMYCIN 5 MG/GM OP OINT: TOPICAL_OINTMENT | OPHTHALMIC | 0 refills | Status: DC

## 2021-12-12 MED ORDER — TETRACAINE HCL 0.5 % OP SOLN
2.0000 [drp] | Freq: Once | OPHTHALMIC | Status: AC
  Administered 2021-12-12: 2 [drp] via OPHTHALMIC
  Filled 2021-12-12: qty 4

## 2021-12-12 MED ORDER — FLUORESCEIN SODIUM 1 MG OP STRP
1.0000 | ORAL_STRIP | Freq: Once | OPHTHALMIC | Status: AC
  Administered 2021-12-12: 1 via OPHTHALMIC
  Filled 2021-12-12: qty 1

## 2021-12-12 NOTE — ED Triage Notes (Addendum)
Pt reports bilateral eye redness and clear drainage accompanied by blurred vision. Initially began in R eye before also appearing in L eye as well. No known injury. Pt also reports he had to use 2 home nitroglycerin tablets last night for chest pain. No symptoms this morning, but reports chest pain was in slightly different location last night.

## 2021-12-12 NOTE — ED Provider Notes (Signed)
[facility] EMERGENCY DEPARTMENT Provider Note   CSN: [id number] Arrival date & time: 12/12/21  0831     History  Chief Complaint  Patient presents with   Conjunctivitis    Noah Grant is a 71 y.o. male.  HPI     Second dose of remicaide 12/20  A few days later developed right eye redness like a blood vessel popped, no pain, cleared up Then 3 days ago developed pain and swelling of right eye then Tuesday developed pain and swelling of left eye During night both eyes swelled shut, crusting of eyes, blurred vision Clear watery discharge, were fused shut then tears would come Vision blurred, clears a little bit with blinking 6/10, feels like eyes full of rocks, no eye itching. Hurts to blink Hurts a little bit to move them No other known exposures, didn't get anything in eyes  My eye doctor is name of opt Wears just reading glasses  Has had some congestion, cough No fever, no body aches No sick contacts  Home Medications Prior to Admission medications   Medication Sig Start Date End Date Taking? Authorizing Provider  amLODipine (NORVASC) 10 MG tablet TAKE ONE TABLET BY MOUTH DAILY 11/16/21  Yes [name], DO  aspirin EC 81 MG tablet Take 81 mg by mouth daily.   Yes [provider]  Cholecalciferol 50 MCG (2000 UT) CAPS Take 2,000 Units by mouth daily.    Yes [provider]  clopidogrel (PLAVIX) 75 MG tablet TAKE ONE TABLET BY MOUTH DAILY 08/20/21  Yes Revankar, Reita Cliche, MD  Coenzyme Q10 (COQ10) 200 MG CAPS Take 200 mg by mouth daily.   Yes [provider]  erythromycin ophthalmic ointment Place a 1/2 inch ribbon of ointment into the lower eyelid four times daily for 7 days. 12/12/21  Yes Gareth Morgan, MD  ezetimibe (ZETIA) 10 MG tablet TAKE ONE TABLET BY MOUTH DAILY 10/17/21  Yes Revankar, Reita Cliche, MD  hydrALAZINE (APRESOLINE) 50 MG tablet Take 50 mg by mouth 3 (three) times daily.   Yes [provider]   inFLIXimab (REMICADE IV) Inject 300 mg into the vein every 8 (eight) weeks.   Yes [provider]  labetalol (NORMODYNE) 100 MG tablet Take 1 tablet (100 mg total) by mouth 2 (two) times daily. 07/31/20  Yes [name], DO  leflunomide (ARAVA) 20 MG tablet Take 20 mg by mouth every Monday, Tuesday, Wednesday, Thursday, and Friday.   Yes [provider]  lithium carbonate 150 MG capsule Take 3 capsules (450 mg total) by mouth daily. 10/10/21  Yes Cottle, Billey Co., MD  losartan (COZAAR) 100 MG tablet Take 100 mg by mouth daily.   Yes [provider]  Lysine HCl 1000 MG TABS Take 1,000 mg by mouth 2 (two) times daily.    Yes [provider]  nitroGLYCERIN (NITROSTAT) 0.4 MG SL tablet Place 0.4 mg under the tongue every 5 (five) minutes as needed for chest pain.   Yes [provider]  pantoprazole (PROTONIX) 40 MG tablet TAKE ONE TABLET BY MOUTH DAILY 05/21/21  Yes [name], Crosby Oyster, DO  predniSONE (DELTASONE) 5 MG tablet Take 1 tablet by mouth 2 (two) times daily. 09/11/21  Yes [provider]  simvastatin (ZOCOR) 5 MG tablet TAKE ONE TABLET BY MOUTH DAILY 03/22/21  Yes [name], DO      Allergies    Hydroxychloroquine, Methotrexate, Nsaids, Isosorbide, Lovastatin, Pravastatin, Rosuvastatin, and Sulfasalazine    Review of  Systems   Review of Systems  Constitutional:  Negative for fever.  Eyes:  Positive for pain, discharge, redness and visual disturbance. Negative for photophobia.  Respiratory:  Positive for cough.   Musculoskeletal:  Positive for arthralgias (bad RA).  Skin:  Negative for rash.   Physical Exam Updated Vital Signs BP 132/68    Pulse (!) 59    Temp 98.2 F (36.8 C) (Oral)    Resp 16    SpO2 98%  Physical Exam Vitals and nursing note reviewed.  Constitutional:      General: He is not in acute distress.    Appearance: He is well-developed. He is not diaphoretic.  HENT:     Head:  Normocephalic and atraumatic.  Eyes:     General: No visual field deficit.       Right eye: Foreign body (eyelash in eye) and discharge present.        Left eye: Discharge present.    Intraocular pressure: Right eye pressure is 17 mmHg. Left eye pressure is 15 mmHg.     Extraocular Movements:     Right eye: Normal extraocular motion.     Left eye: Normal extraocular motion.     Conjunctiva/sclera:     Right eye: Right conjunctiva is injected.     Left eye: Left conjunctiva is injected.  Cardiovascular:     Rate and Rhythm: Normal rate and regular rhythm.     Heart sounds: Normal heart sounds. No murmur heard.   No friction rub. No gallop.  Pulmonary:     Effort: Pulmonary effort is normal. No respiratory distress.     Breath sounds: Normal breath sounds. No wheezing or rales.  Abdominal:     General: There is no distension.     Palpations: Abdomen is soft.     Tenderness: There is no abdominal tenderness. There is no guarding.  Musculoskeletal:     Cervical back: Normal range of motion.  Skin:    General: Skin is warm and dry.  Neurological:     Mental Status: He is alert and oriented to person, place, and time.    ED Results / Procedures / Treatments   Labs (all labs ordered are listed, but only abnormal results are displayed) Labs Reviewed  RESP PANEL BY RT-PCR (FLU A&B, COVID) [id number]    EKG EKG Interpretation  Date/Time:  Wednesday December 12 2021 09:09:21 EST Ventricular Rate:  56 PR Interval:  166 QRS Duration: 137 QT Interval:  429 QTC Calculation: 414 R Axis:   93 Text Interpretation: Sinus rhythm RBBB and LPFB No significant change since last tracing Confirmed by Gareth Morgan [phone]) on 12/12/2021 10:25:18 PM  Radiology No results found.  Procedures Procedures     Visual Acuity  Right Eye Distance: 20/100 Left Eye Distance: 20/70 Bilateral Distance: 20/50  Right Eye Near:   Left Eye Near:    Bilateral Near:     Medications Ordered in  ED Medications  tetracaine (PONTOCAINE) 0.5 % ophthalmic solution 2 drop (2 drops Both Eyes Given by Other 12/12/21 0934)  fluorescein ophthalmic strip 1 strip (1 strip Both Eyes Given by Other 12/12/21 0935)    ED Course/ Medical Decision Making/ A&P                           Medical Decision Making  71 year old male with history of hypertension, hyperlipidemia, bipolar, coronary artery disease, angina, peripheral artery disease, who presents with concern for bilateral  eye pain and redness.  Also reports having an episode of chest pain last night which resolved after taking nitroglycerin.  Reports that he has been followed by Dr. Geraldo Pitter for his anginal symptoms, and that his symptoms are not escalated, would not present for [facility] or unusual for him.  Offered lab testing to evaluate troponin, however he declines.  Given he is not having worsening symptoms, has known angina that responds to nitroglycerin and is chest pain free, feel it is not unreasonable for him to forgo labs and follow up with Dr. Lennox Pippins with strict return precautions.  Regarding eye symptoms: He has normal intraocular pressures bilaterally, no sign of glaucoma.  Has some mild edema and erythema surrounding his bilateral eyes that is not consistent with periorbital cellulitis, no does not have history or exam findings to suggest an orbital cellulitis.  He was found to have a eyelash in the right eye which was removed and associated corneal abrasion, although do not feel this was responsible for bilateral symptoms.  Pain resolved with tetracaine, do not see signs of iritis.  Suspect most likely viral or allergic conjunctivitis given other URI symptoms but difficult to rule out bacterial etiology in setting of discharge. Given erythromycin ointment to use for abrasion and possible bacterial conjunctivitis and recommend Optometry follow up and strict return precautions.        Final Clinical Impression(s) / ED Diagnoses Final  diagnoses:  Acute conjunctivitis of both eyes, unspecified acute conjunctivitis type  Abrasion of right cornea, initial encounter  Foreign body of right eye, initial encounter    Rx / DC Orders ED Discharge Orders          Ordered    erythromycin ophthalmic ointment        12/12/21 1103              Gareth Morgan, MD 12/12/21 2237

## 2021-12-16 ENCOUNTER — Other Ambulatory Visit: Payer: Self-pay | Admitting: Cardiology

## 2021-12-16 ENCOUNTER — Other Ambulatory Visit: Payer: Self-pay | Admitting: Family Medicine

## 2021-12-18 DIAGNOSIS — Z87891 Personal history of nicotine dependence: Secondary | ICD-10-CM | POA: Diagnosis not present

## 2021-12-18 DIAGNOSIS — Z955 Presence of coronary angioplasty implant and graft: Secondary | ICD-10-CM | POA: Diagnosis not present

## 2021-12-18 DIAGNOSIS — I25118 Atherosclerotic heart disease of native coronary artery with other forms of angina pectoris: Secondary | ICD-10-CM | POA: Diagnosis not present

## 2021-12-18 DIAGNOSIS — I739 Peripheral vascular disease, unspecified: Secondary | ICD-10-CM | POA: Diagnosis not present

## 2021-12-18 DIAGNOSIS — D692 Other nonthrombocytopenic purpura: Secondary | ICD-10-CM | POA: Diagnosis not present

## 2021-12-18 DIAGNOSIS — M059 Rheumatoid arthritis with rheumatoid factor, unspecified: Secondary | ICD-10-CM | POA: Diagnosis not present

## 2021-12-18 DIAGNOSIS — Z7982 Long term (current) use of aspirin: Secondary | ICD-10-CM | POA: Diagnosis not present

## 2021-12-18 DIAGNOSIS — N1831 Chronic kidney disease, stage 3a: Secondary | ICD-10-CM | POA: Diagnosis not present

## 2022-01-01 DIAGNOSIS — M05742 Rheumatoid arthritis with rheumatoid factor of left hand without organ or systems involvement: Secondary | ICD-10-CM | POA: Diagnosis not present

## 2022-01-01 DIAGNOSIS — M069 Rheumatoid arthritis, unspecified: Secondary | ICD-10-CM | POA: Diagnosis not present

## 2022-01-01 DIAGNOSIS — Z79899 Other long term (current) drug therapy: Secondary | ICD-10-CM | POA: Diagnosis not present

## 2022-01-01 DIAGNOSIS — M05741 Rheumatoid arthritis with rheumatoid factor of right hand without organ or systems involvement: Secondary | ICD-10-CM | POA: Diagnosis not present

## 2022-01-02 ENCOUNTER — Encounter: Payer: Self-pay | Admitting: Family Medicine

## 2022-01-02 ENCOUNTER — Ambulatory Visit (INDEPENDENT_AMBULATORY_CARE_PROVIDER_SITE_OTHER): Payer: PPO | Admitting: Family Medicine

## 2022-01-02 VITALS — BP 130/71 | HR 54 | Temp 98.7°F | Ht 67.0 in | Wt 162.2 lb

## 2022-01-02 DIAGNOSIS — J42 Unspecified chronic bronchitis: Secondary | ICD-10-CM

## 2022-01-02 DIAGNOSIS — Z23 Encounter for immunization: Secondary | ICD-10-CM | POA: Diagnosis not present

## 2022-01-02 DIAGNOSIS — I700 Atherosclerosis of aorta: Secondary | ICD-10-CM | POA: Diagnosis not present

## 2022-01-02 DIAGNOSIS — M069 Rheumatoid arthritis, unspecified: Secondary | ICD-10-CM | POA: Diagnosis not present

## 2022-01-02 DIAGNOSIS — I10 Essential (primary) hypertension: Secondary | ICD-10-CM

## 2022-01-02 DIAGNOSIS — I25119 Atherosclerotic heart disease of native coronary artery with unspecified angina pectoris: Secondary | ICD-10-CM | POA: Diagnosis not present

## 2022-01-02 DIAGNOSIS — K219 Gastro-esophageal reflux disease without esophagitis: Secondary | ICD-10-CM | POA: Diagnosis not present

## 2022-01-02 DIAGNOSIS — F3132 Bipolar disorder, current episode depressed, moderate: Secondary | ICD-10-CM | POA: Diagnosis not present

## 2022-01-02 LAB — COMPREHENSIVE METABOLIC PANEL
ALT: 31 U/L (ref 0–53)
AST: 23 U/L (ref 0–37)
Albumin: 4.2 g/dL (ref 3.5–5.2)
Alkaline Phosphatase: 56 U/L (ref 39–117)
BUN: 27 mg/dL — ABNORMAL HIGH (ref 6–23)
CO2: 27 mEq/L (ref 19–32)
Calcium: 9 mg/dL (ref 8.4–10.5)
Chloride: 106 mEq/L (ref 96–112)
Creatinine, Ser: 1.44 mg/dL (ref 0.40–1.50)
GFR: 49.37 mL/min — ABNORMAL LOW (ref >60.00)
Glucose, Bld: 93 mg/dL (ref 70–99)
Potassium: 5 mEq/L (ref 3.5–5.1)
Sodium: 138 mEq/L (ref 135–145)
Total Bilirubin: 0.4 mg/dL (ref 0.2–1.2)
Total Protein: 6.1 g/dL (ref 6.0–8.3)

## 2022-01-02 LAB — LIPID PANEL
Cholesterol: 107 mg/dL (ref 0–200)
HDL: 28.9 mg/dL — ABNORMAL LOW (ref >39.00)
LDL Cholesterol: 39 mg/dL (ref 0–99)
NonHDL: 77.76
Total CHOL/HDL Ratio: 4
Triglycerides: 193 mg/dL — ABNORMAL HIGH (ref 0.0–149.0)
VLDL: 38.6 mg/dL (ref 0.0–40.0)

## 2022-01-02 MED ORDER — AMLODIPINE BESYLATE 10 MG PO TABS: 10.0000 mg | ORAL_TABLET | Freq: Every day | ORAL | 2 refills | Status: DC

## 2022-01-02 MED ORDER — SIMVASTATIN 5 MG PO TABS: 5.0000 mg | ORAL_TABLET | Freq: Every day | ORAL | 2 refills | Status: DC

## 2022-01-02 MED ORDER — PANTOPRAZOLE SODIUM 40 MG PO TBEC: 40.0000 mg | DELAYED_RELEASE_TABLET | Freq: Every day | ORAL | 2 refills | Status: DC

## 2022-01-02 MED ORDER — LABETALOL HCL 100 MG PO TABS: 100.0000 mg | ORAL_TABLET | Freq: Two times a day (BID) | ORAL | 2 refills | Status: DC

## 2022-01-02 NOTE — Progress Notes (Signed)
Chief Complaint  Patient presents with   Follow-up    Subjective Noah Grant is a 71 y.o. male who presents for hypertension follow up. He does not monitor home blood pressures. He is compliant with medications- Norvasc 10 mg/d, labetalol 100 mg bid, hydralazine 25 mg TID. Patient has these side effects of medication: none He is adhering to a healthy diet overall. Current exercise: some walking No CP or SOB.  Hyperlipidemia Patient presents for dyslipidemia follow up. Currently being treated with Zocor 5 mg/d and compliance with treatment thus far has been good. He complains of myalgias at higher doses. Diet/exercise.  The patient is known to have coexisting coronary artery disease.  GERD Hx of GERD on Protonix, getting worse. Failed Prilosec. Takes Pepcid prn. Sees GI.    Past Medical History:  Diagnosis Date   Abdominal aortic atherosclerosis ([place])    Anemia    likely of chronic disease   Angina pectoris ([place]) 08/30/2020   Arthritis    Barrett's esophagus without dysplasia 03/25/2019   Formatting of this note might be different from the original. EGD done in 02/2019. Due in 3 years.   Bipolar 1 disorder ([place])    Bipolar 1 disorder, mixed, moderate ([place]) 08/16/2014   Bipolar affective disorder, currently depressed, moderate ([place]) 03/08/2020   Bipolar depression ([place]) 08/16/2014   BPH with urinary obstruction 12/29/2018   Brain ischemia 03/08/2020   Cardiac murmur 08/30/2020   Chest pain 09/06/2020   Chicken pox    Chronic bronchitis (HCC)    Cognitive complaints 03/08/2020   Coronary artery disease involving native coronary artery of native heart with angina pectoris ([place]) 09/06/2020   Depression    Disc degeneration, 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   Drug therapy 05/18/2021   Essential hypertension    GAD (generalized anxiety disorder) 11/02/2018   Gastroesophageal reflux disease 08/16/2014   Formatting of this note might be different from the original. Last Assessment & Plan:  Well  controlled. Continue current medications.   Gastroesophageal reflux disease without esophagitis 08/16/2014   Formatting of this note might be different from the original. Last Assessment & Plan:  Well controlled. Continue current medications.   Herpes gingivostomatitis 02/10/2015   History of colon polyps 03/25/2019   Formatting of this note might be different from the original. tubular adenoma   History of stroke 11/10/2019   Hyperlipidemia    Hypertension    Ingrowing nail 03/12/2020   Labile hypertension 08/16/2014   Formatting of this note might be different from the original. Last Assessment & Plan:  Slightly above goal today secondary to pain. Will continue current regimen. Will check CMP today.   Lichen planus [id number]   Last Assessment & Plan:  Formatting of this note might be different from the original. Concern over possible lichen planus. His dentist noticed lesions of his oral mucosa.  It was felt to be lichen planus.  He presents to discuss further.  Rarely does the area hurt.He smoked in the distant past EXAM shows several white patches on the buccal mucosa bilaterally.  No other concerning oral mucosal les   Long-term use of immunosuppressant medication 03/23/2018   Low testosterone 09/17/2016   Midline thoracic back pain 02/10/2015   Mixed hyperlipidemia 08/16/2014   Normochromic normocytic anemia 08/16/2014   PAD (peripheral artery disease) ([place]) 03/23/2019   Pain of right hip joint 01/19/2016   Rheumatic fever    Rheumatoid arthritis ([place]) 10/29/2018   Formatting of this note might be different from the original. Rheum:  Dr. Gerilyn Nestle Formerly on MTX - stopped due to mouth sores. Currently started on Arava.  Also on 5 mg prednisone x 3 months during run-in.   Right-sided chest wall pain 04/17/2019   Seasonal allergies 03/23/2018   Stage 3a chronic kidney disease ([place]) 07/21/2019   Statin intolerance 07/31/2020   Thoracic radiculopathy 06/16/2019   Tremor 06/16/2019   Vitamin B12 deficiency     Injections   Vitamin D deficiency 09/17/2016    Exam BP 130/71    Pulse (!) 54    Temp 98.7 F (37.1 C) (Oral)    Ht 5\' 7"  (1.702 m)    Wt 162 lb 4 oz (73.6 kg)    SpO2 99%    BMI 25.41 kg/m  General:  well developed, well nourished, in no apparent distress Heart: Reg rhythm, bradycardic, no bruits, no LE edema Lungs: clear to auscultation, no accessory muscle use Psych: well oriented with normal range of affect and appropriate judgment/insight  Essential hypertension  Abdominal aortic atherosclerosis (HCC) - Plan: simvastatin (ZOCOR) 5 MG tablet, Comprehensive metabolic panel, Lipid panel  Gastroesophageal reflux disease, unspecified whether esophagitis present - Plan: pantoprazole (PROTONIX) 40 MG tablet  Rheumatoid arthritis, involving unspecified site, unspecified whether rheumatoid factor present ([place]) - Plan: Ambulatory referral to Rheumatology  Chronic bronchitis, unspecified chronic bronchitis type ([place]), Chronic  Bipolar disorder with moderate depression ([place]), Chronic  Coronary artery disease involving native coronary artery of native heart with angina pectoris ([place]), Chronic  Chronic, stable. Cont Norvasc 10 mg/d, labetalol 100 mg bid, hydralazine 25 mg TID. Counseled on diet and exercise. Chronic, stable. Ck labs. Cont Zocor 5 mg/d.  Chronic, unstable. Start Pepcid 20 mg/d bid routinely rather than prn. Cont Protonix 40 mg/d. Might need to f/u w GI. Has known hx of Barrett's also.  Refer to [facility] Rheum as he would like all providers through [facility].  PCV20 today.  F/u in 6 mo for CPE or prn. The patient voiced understanding and agreement to the plan.  [place], DO 01/02/22  10:34 AM

## 2022-01-02 NOTE — Patient Instructions (Signed)
Give us 2-3 business days to get the results of your labs back.   Keep the diet clean and stay active.  If you do not hear anything about your referral in the next 1-2 weeks, call our office and ask for an update.  Let us know if you need anything. 

## 2022-01-22 ENCOUNTER — Other Ambulatory Visit: Payer: Self-pay | Admitting: Cardiology

## 2022-01-22 DIAGNOSIS — N1831 Chronic kidney disease, stage 3a: Secondary | ICD-10-CM | POA: Diagnosis not present

## 2022-01-29 DIAGNOSIS — I739 Peripheral vascular disease, unspecified: Secondary | ICD-10-CM | POA: Diagnosis not present

## 2022-01-29 DIAGNOSIS — M059 Rheumatoid arthritis with rheumatoid factor, unspecified: Secondary | ICD-10-CM | POA: Diagnosis not present

## 2022-01-29 DIAGNOSIS — M05742 Rheumatoid arthritis with rheumatoid factor of left hand without organ or systems involvement: Secondary | ICD-10-CM | POA: Diagnosis not present

## 2022-01-29 DIAGNOSIS — N1831 Chronic kidney disease, stage 3a: Secondary | ICD-10-CM | POA: Diagnosis not present

## 2022-01-29 DIAGNOSIS — R0989 Other specified symptoms and signs involving the circulatory and respiratory systems: Secondary | ICD-10-CM | POA: Diagnosis not present

## 2022-01-29 DIAGNOSIS — Z955 Presence of coronary angioplasty implant and graft: Secondary | ICD-10-CM | POA: Diagnosis not present

## 2022-01-29 DIAGNOSIS — Z7982 Long term (current) use of aspirin: Secondary | ICD-10-CM | POA: Diagnosis not present

## 2022-01-29 DIAGNOSIS — I25118 Atherosclerotic heart disease of native coronary artery with other forms of angina pectoris: Secondary | ICD-10-CM | POA: Diagnosis not present

## 2022-01-29 DIAGNOSIS — M05741 Rheumatoid arthritis with rheumatoid factor of right hand without organ or systems involvement: Secondary | ICD-10-CM | POA: Diagnosis not present

## 2022-01-29 DIAGNOSIS — Z6825 Body mass index (BMI) 25.0-25.9, adult: Secondary | ICD-10-CM | POA: Diagnosis not present

## 2022-01-29 DIAGNOSIS — D692 Other nonthrombocytopenic purpura: Secondary | ICD-10-CM | POA: Diagnosis not present

## 2022-02-06 ENCOUNTER — Telehealth: Payer: Self-pay | Admitting: Family Medicine

## 2022-02-06 NOTE — Telephone Encounter (Signed)
Pt was wondering if Dr.[name] was able to take on his wife Corrie Dandy as a new pt. Please advise.  ?

## 2022-02-12 ENCOUNTER — Ambulatory Visit: Payer: PPO | Admitting: Internal Medicine

## 2022-03-13 ENCOUNTER — Ambulatory Visit: Payer: PPO | Admitting: Internal Medicine

## 2022-04-02 ENCOUNTER — Ambulatory Visit: Payer: PPO | Admitting: Cardiology

## 2022-04-02 ENCOUNTER — Encounter: Payer: Self-pay | Admitting: Cardiology

## 2022-04-02 VITALS — BP 136/60 | HR 60 | Ht 67.0 in | Wt 162.0 lb

## 2022-04-02 DIAGNOSIS — I10 Essential (primary) hypertension: Secondary | ICD-10-CM | POA: Diagnosis not present

## 2022-04-02 DIAGNOSIS — E782 Mixed hyperlipidemia: Secondary | ICD-10-CM | POA: Diagnosis not present

## 2022-04-02 DIAGNOSIS — I700 Atherosclerosis of aorta: Secondary | ICD-10-CM

## 2022-04-02 DIAGNOSIS — Z789 Other specified health status: Secondary | ICD-10-CM | POA: Diagnosis not present

## 2022-04-02 DIAGNOSIS — I25119 Atherosclerotic heart disease of native coronary artery with unspecified angina pectoris: Secondary | ICD-10-CM

## 2022-04-02 NOTE — Progress Notes (Signed)
Denies ?Cardiology Office Note:   ? ?Date:  04/02/2022  ? ?ID:  Noah Grant, DOB [birth year], MRN [id number] ? ?PCP:  [name], DO  ?Cardiologist:  Jenean Lindau, MD  ? ?Referring MD: [name]*  ? ? ?ASSESSMENT:   ? ?1. Coronary artery disease involving native coronary artery of native heart with angina pectoris ([place])   ?2. Essential hypertension   ?3. Mixed hyperlipidemia   ?4. Abdominal aortic atherosclerosis ([place])   ?5. Statin intolerance   ? ?PLAN:   ? ?In order of problems listed above: ? ?Coronary arteriosclerosis and abdominal aortic atherosclerosis: Secondary prevention stressed to the patient.  Importance of compliance with diet medication stressed any vocalized understanding.  He walks about half an hour and a daily basis though he walks slow.  I encouraged him to continue to do this.  He is on dual antiplatelet therapy and I do not think he needs it at this time.  I have asked him to discontinue aspirin. ?Essential hypertension: Blood pressure stable and diet was emphasized.  Lifestyle modification urged. ?Mixed dyslipidemia: Generally he is statin intolerant.  He is on low-dose statin and Zetia and doing and tolerating well.  He has no issues with this.  I reviewed [facility] from [facility] sheet and discussed with him at length. ?Patient will be seen in follow-up appointment in 9 months or earlier if the patient has any concerns ? ? ? ?Medication Adjustments/Labs and Tests Ordered: ?Current medicines are reviewed at length with the patient today.  Concerns regarding medicines are outlined above.  ?No orders of the defined types were placed in this encounter. ? ?No orders of the defined types were placed in this encounter. ? ? ? ?No chief complaint on file. ?  ? ?History of Present Illness:   ? ?Noah Grant is a 71 y.o. male.  Patient has past medical history of coronary atherosclerosis, essential hypertension, dyslipidemia and rheumatoid arthritis.  Problems at this time and  takes care of activities of daily living.  No chest pain orthopnea or PND.  He walks on a regular basis.  At the time of my evaluation, the patient is alert awake oriented and in no distress. ? ?Past Medical History:  ?Diagnosis Date  ? Abdominal aortic atherosclerosis ([place])   ? Anemia   ? likely of chronic disease  ? Angina pectoris ([place]) 08/30/2020  ? Arthritis   ? Barrett's esophagus without dysplasia 03/25/2019  ? Formatting of this note might be different from the original. EGD done in 02/2019. Due in 3 years.  ? Bipolar 1 disorder ([place])   ? Bipolar 1 disorder, mixed, moderate ([place]) 08/16/2014  ? Bipolar affective disorder, currently depressed, moderate ([place]) 03/08/2020  ? Bipolar depression ([place]) 08/16/2014  ? BPH with urinary obstruction 12/29/2018  ? Brain ischemia 03/08/2020  ? Cardiac murmur 08/30/2020  ? Chest pain 09/06/2020  ? Chicken pox   ? Chronic bronchitis ([place])   ? Cognitive complaints 03/08/2020  ? Coronary artery disease involving native coronary artery of native heart with angina pectoris ([place]) 09/06/2020  ? Depression   ? Disc degeneration, 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  ? Drug therapy 05/18/2021  ? Essential hypertension   ? GAD (generalized anxiety disorder) 11/02/2018  ? Gastroesophageal reflux disease 08/16/2014  ? Formatting of this note might be different from the original. Last Assessment & Plan:  Well controlled. Continue current medications.  ? Gastroesophageal reflux disease without esophagitis 08/16/2014  ? Formatting of this note might be different from  the original. Last Assessment & Plan:  Well controlled. Continue current medications.  ? Herpes gingivostomatitis 02/10/2015  ? History of colon polyps 03/25/2019  ? Formatting of this note might be different from the original. tubular adenoma  ? History of stroke 11/10/2019  ? Hyperlipidemia   ? Hypertension   ? Ingrowing nail 03/12/2020  ? Labile hypertension 08/16/2014  ? Formatting of this note might be different from the original. Last Assessment & Plan:  Slightly  above goal today secondary to pain. Will continue current regimen. Will check CMP today.  ? Lichen planus [id number]  ? Last Assessment & Plan:  Formatting of this note might be different from the original. Concern over possible lichen planus. His dentist noticed lesions of his oral mucosa.  It was felt to be lichen planus.  He presents to discuss further.  Rarely does the area hurt.He smoked in the distant past EXAM shows several white patches on the buccal mucosa bilaterally.  No other concerning oral mucosal les  ? Long-term use of immunosuppressant medication 03/23/2018  ? Low testosterone 09/17/2016  ? Midline thoracic back pain 02/10/2015  ? Mixed hyperlipidemia 08/16/2014  ? Normochromic normocytic anemia 08/16/2014  ? PAD (peripheral artery disease) ([place]) 03/23/2019  ? Pain of right hip joint 01/19/2016  ? Rheumatic fever   ? Rheumatoid arthritis ([place]) 10/29/2018  ? Formatting of this note might be different from the original. Rheum:  Dr. Gerilyn Nestle Formerly on MTX - stopped due to mouth sores. Currently started on Arava.  Also on 5 mg prednisone x 3 months during run-in.  ? Right-sided chest wall pain 04/17/2019  ? Seasonal allergies 03/23/2018  ? Stage 3a chronic kidney disease ([place]) 07/21/2019  ? Statin intolerance 07/31/2020  ? Thoracic radiculopathy 06/16/2019  ? Tremor 06/16/2019  ? Vitamin B12 deficiency   ? Injections  ? Vitamin D deficiency 09/17/2016  ? ? ?Past Surgical History:  ?Procedure Laterality Date  ? CLAVICLE SURGERY    ? Right, Hardware placed  ? CORONARY STENT INTERVENTION N/A 09/06/2020  ? Procedure: CORONARY STENT INTERVENTION;  Surgeon: Sherren Mocha, MD;  Location: [facility];  Service: Cardiovascular;  Laterality: N/A;  ? LEFT HEART CATH AND CORONARY ANGIOGRAPHY N/A 09/06/2020  ? Procedure: LEFT HEART CATH AND CORONARY ANGIOGRAPHY;  Surgeon: Sherren Mocha, MD;  Location: [facility];  Service: Cardiovascular;  Laterality: N/A;  ? WISDOM TOOTH EXTRACTION    ? ? ?Current  Medications: ?Current Meds  ?Medication Sig  ? amLODipine (NORVASC) 10 MG tablet Take 1 tablet (10 mg total) by mouth daily.  ? Cholecalciferol 50 MCG (2000 UT) CAPS Take 2,000 Units by mouth daily.   ? clopidogrel (PLAVIX) 75 MG tablet TAKE ONE TABLET BY MOUTH DAILY  ? Coenzyme Q10 (COQ10) 200 MG CAPS Take 200 mg by mouth daily.  ? ezetimibe (ZETIA) 10 MG tablet TAKE ONE TABLET BY MOUTH DAILY  ? hydrALAZINE (APRESOLINE) 50 MG tablet Take 50 mg by mouth 3 (three) times daily.  ? labetalol (NORMODYNE) 100 MG tablet Take 1 tablet (100 mg total) by mouth 2 (two) times daily.  ? leflunomide (ARAVA) 20 MG tablet Take 20 mg by mouth every Monday, Tuesday, Wednesday, Thursday, and Friday.  ? lithium carbonate 150 MG capsule Take 3 capsules (450 mg total) by mouth daily.  ? losartan (COZAAR) 100 MG tablet Take 100 mg by mouth daily.  ? Lysine HCl 1000 MG TABS Take 1,000 mg by mouth 2 (two) times daily.   ? nitroGLYCERIN (NITROSTAT)  0.4 MG SL tablet Place 1 tablet (0.4 mg total) under the tongue every 5 (five) minutes as needed for chest pain.  ? pantoprazole (PROTONIX) 40 MG tablet Take 1 tablet (40 mg total) by mouth daily.  ? predniSONE (DELTASONE) 5 MG tablet Take 1 tablet by mouth 2 (two) times daily.  ? simvastatin (ZOCOR) 5 MG tablet Take 1 tablet (5 mg total) by mouth daily.  ? [DISCONTINUED] aspirin EC 81 MG tablet Take 81 mg by mouth daily.  ?  ? ?Allergies:   Hydroxychloroquine, Methotrexate, Nsaids, Isosorbide, Lovastatin, Pravastatin, Rosuvastatin, and Sulfasalazine  ? ?Social History  ? ?Socioeconomic History  ? Marital status: Married  ?  Spouse name: Not on file  ? Number of children: Not on file  ? Years of education: Not on file  ? Highest education level: Not on file  ?Occupational History  ? Not on file  ?Tobacco Use  ? Smoking status: Former  ?  Types: Cigarettes  ? Smokeless tobacco: Never  ? Tobacco comments:  ?  Quit>40 years  ?Substance and Sexual Activity  ? Alcohol use: Not Currently  ?   Alcohol/week: 0.0 standard drinks  ? Drug use: Never  ? Sexual activity: Not on file  ?Other Topics Concern  ? Not on file  ?Social History Narrative  ? Not on file  ? ?Social Determinants of Health  ? ?Financial Resource Strain:

## 2022-04-02 NOTE — Patient Instructions (Signed)
Medication Instructions:  ?Your physician has recommended you make the following change in your medication:  ? ?Stop taking aspirin. ? ?*If you need a refill on your cardiac medications before your next appointment, please call your pharmacy* ? ? ?Lab Work: ?None ordered ?If you have labs (blood work) drawn today and your tests are completely normal, you will receive your results only by: ?MyChart Message (if you have MyChart) OR ?A paper copy in the mail ?If you have any lab test that is abnormal or we need to change your treatment, we will call you to review the results. ? ? ?Testing/Procedures: ?None ordered ? ? ?Follow-Up: ?At [facility], you and your health needs are our priority.  As part of our continuing mission to provide you with exceptional heart care, we have created designated Provider Care Teams.  These Care Teams include your primary Cardiologist (physician) and Advanced Practice Providers (APPs -  Physician Assistants and Nurse Practitioners) who all work together to provide you with the care you need, when you need it. ? ?We recommend signing up for the patient portal called "MyChart".  Sign up information is provided on this After Visit Summary.  MyChart is used to connect with patients for Virtual Visits (Telemedicine).  Patients are able to view lab/test results, encounter notes, upcoming appointments, etc.  Non-urgent messages can be sent to your provider as well.   ?To learn more about what you can do with MyChart, go to [email or web address].   ? ?Your next appointment:   ?9 month(s) ? ?The format for your next appointment:   ?In Person ? ?Provider:   ?Belva Cromeajan Revankar, MD ? ? ?Other Instructions ?NA ? ?

## 2022-04-06 ENCOUNTER — Other Ambulatory Visit: Payer: Self-pay | Admitting: Psychiatry

## 2022-04-06 DIAGNOSIS — F3132 Bipolar disorder, current episode depressed, moderate: Secondary | ICD-10-CM

## 2022-04-07 NOTE — Telephone Encounter (Signed)
Has appt with Dr. Clovis Pu 5/2 ?

## 2022-04-09 ENCOUNTER — Ambulatory Visit (INDEPENDENT_AMBULATORY_CARE_PROVIDER_SITE_OTHER): Payer: PPO | Admitting: Family Medicine

## 2022-04-09 ENCOUNTER — Ambulatory Visit: Payer: PPO | Admitting: Psychiatry

## 2022-04-09 ENCOUNTER — Encounter: Payer: Self-pay | Admitting: Family Medicine

## 2022-04-09 ENCOUNTER — Encounter: Payer: Self-pay | Admitting: Psychiatry

## 2022-04-09 VITALS — BP 130/70 | HR 62 | Temp 99.2°F | Ht 67.0 in | Wt 164.0 lb

## 2022-04-09 DIAGNOSIS — Z79899 Other long term (current) drug therapy: Secondary | ICD-10-CM | POA: Diagnosis not present

## 2022-04-09 DIAGNOSIS — R251 Tremor, unspecified: Secondary | ICD-10-CM

## 2022-04-09 DIAGNOSIS — F3132 Bipolar disorder, current episode depressed, moderate: Secondary | ICD-10-CM

## 2022-04-09 DIAGNOSIS — H6123 Impacted cerumen, bilateral: Secondary | ICD-10-CM

## 2022-04-09 DIAGNOSIS — F411 Generalized anxiety disorder: Secondary | ICD-10-CM

## 2022-04-09 NOTE — Progress Notes (Signed)
Chief Complaint  ?Patient presents with  ? Hearing Loss  ?  Left ear  ? ? ?Subjective: ?Patient is a 71 y.o. male here for hearing loss. ? 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 Has a hx of wax buildup. Usually uses peroxide gtt with good relief. Used these drops Th night and into Fri had the issue. Does not routinely use Q tips. No pain, drainage, itching, fevers.  ? ?Past Medical History:  ?Diagnosis Date  ? Abdominal aortic atherosclerosis ([place])   ? Anemia   ? likely of chronic disease  ? Angina pectoris ([place]) 08/30/2020  ? Arthritis   ? Barrett's esophagus without dysplasia 03/25/2019  ? Formatting of this note might be different from the original. EGD done in 02/2019. Due in 3 years.  ? Bipolar 1 disorder ([place])   ? Bipolar 1 disorder, mixed, moderate ([place]) 08/16/2014  ? Bipolar affective disorder, currently depressed, moderate ([place]) 03/08/2020  ? Bipolar depression ([place]) 08/16/2014  ? BPH with urinary obstruction 12/29/2018  ? Brain ischemia 03/08/2020  ? Cardiac murmur 08/30/2020  ? Chest pain 09/06/2020  ? Chicken pox   ? Chronic bronchitis ([place])   ? Cognitive complaints 03/08/2020  ? Coronary artery disease involving native coronary artery of native heart with angina pectoris ([place]) 09/06/2020  ? Depression   ? Disc degeneration, 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  ? Drug therapy 05/18/2021  ? Essential hypertension   ? GAD (generalized anxiety disorder) 11/02/2018  ? Gastroesophageal reflux disease 08/16/2014  ? Formatting of this note might be different from the original. Last Assessment & Plan:  Well controlled. Continue current medications.  ? Gastroesophageal reflux disease without esophagitis 08/16/2014  ? Formatting of this note might be different from the original. Last Assessment & Plan:  Well controlled. Continue current medications.  ? Herpes gingivostomatitis 02/10/2015  ? History of colon polyps 03/25/2019  ? Formatting of this note might be different from the original. tubular adenoma  ? History of stroke 11/10/2019  ?  Hyperlipidemia   ? Hypertension   ? Ingrowing nail 03/12/2020  ? Labile hypertension 08/16/2014  ? Formatting of this note might be different from the original. Last Assessment & Plan:  Slightly above goal today secondary to pain. Will continue current regimen. Will check CMP today.  ? Lichen planus [id number]  ? Last Assessment & Plan:  Formatting of this note might be different from the original. Concern over possible lichen planus. His dentist noticed lesions of his oral mucosa.  It was felt to be lichen planus.  He presents to discuss further.  Rarely does the area hurt.He smoked in the distant past EXAM shows several white patches on the buccal mucosa bilaterally.  No other concerning oral mucosal les  ? Long-term use of immunosuppressant medication 03/23/2018  ? Low testosterone 09/17/2016  ? Midline thoracic back pain 02/10/2015  ? Mixed hyperlipidemia 08/16/2014  ? Normochromic normocytic anemia 08/16/2014  ? PAD (peripheral artery disease) ([place]) 03/23/2019  ? Pain of right hip joint 01/19/2016  ? Rheumatic fever   ? Rheumatoid arthritis ([place]) 10/29/2018  ? Formatting of this note might be different from the original. Rheum:  Dr. Gerilyn Nestle Formerly on MTX - stopped due to mouth sores. Currently started on Arava.  Also on 5 mg prednisone x 3 months during run-in.  ? Right-sided chest wall pain 04/17/2019  ? Seasonal allergies 03/23/2018  ? Stage 3a chronic kidney disease ([place]) 07/21/2019  ? Statin intolerance 07/31/2020  ? Thoracic radiculopathy 06/16/2019  ?  Tremor 06/16/2019  ? Vitamin B12 deficiency   ? Injections  ? Vitamin D deficiency 09/17/2016  ? ? ?Objective: ?BP 130/70   Pulse 62   Temp 99.2 ?F (37.3 ?C) (Oral)   Ht 5\' 7"  (1.702 m)   Wt 164 lb (74.4 kg)   SpO2 97%   BMI 25.69 kg/m?  ?General: Awake, appears stated age ?Ears: 100% obstructed w cerumen b/l ?Lungs: No accessory muscle use ?Psych: Age appropriate judgment and insight, normal affect and mood ? ?Assessment and Plan: ?Hearing loss due to cerumen  impaction, bilateral ? ?Home care instructions provided in AVS. Lavage today w success. No signs of infection. F/u as originally scheduled or prn.  ?The patient voiced understanding and agreement to the plan. ? ?[name], DO ?04/09/22  ?2:17 PM ? ? ? ? ?

## 2022-04-09 NOTE — Progress Notes (Signed)
Noah Grant ?[id number] ?1951/09/29 ?71 y.o. ? ?Subjective:  ? ?Patient ID:  Noah Grant is a 71 y.o. (DOB 1951/09/29) male. ? ?Chief Complaint:  ?Chief Complaint  ?Patient presents with  ? Follow-up  ? ? ?Depression ?       Associated symptoms include no decreased concentration and no suicidal ideas.  Past medical history includes anxiety.   ?Anxiety ?Symptoms include nervous/anxious behavior. Patient reports no chest pain, confusion, decreased concentration or suicidal ideas.  ? ? ?Medication Refill ?Associated symptoms include arthralgias. Pertinent negatives include no chest pain or weakness.  ?Noah Grant presents to the office today for follow-up of bipolar, GAD, poor STM. ? ?seen May 18, 2019 and 11/17/2019 without med changes.   ? ?05/17/2020 appointment with the following noted: ?Word recall problems with neuropsych testing by Dr. Orie FishermanAdam McDermott inconclusive results.  Test reviewed. Still tremor can interfere with eating. ?Nocturia Dt overactive bladder. ?Overall mentally ok but too many medical concerns with RA and muscle pain.   Chronic pain issues ongoing.   Htn controlled.  ?Plan: Lithium level from January 2021 0.8 on 600 mg daily. Gradually it's crept up.  Disc in detail it's relation to renal function and Cr has increased gradually. ?BC tremor & jerks reduce to 450 mg daily. ? ?02/12/2021 appointment with the following noted: ?Reduced ithium with no change in tremor. ?End of December, CVD stent placement and reaction to contrast dye.   ?Cardiologists ask if he can come off CBZ.   DT DDI. ?In the last year has felet mentally better than ever.   ?Plan: Reduce carbamazepine by 1/2 tablet every week until off of it. ? ?04/09/2021 appointment with the following noted: ?Off CBZ and didn't have any problems.  No worse for mood or anxiety.  Never slept good in life.  Melatonin caused dizziness. ?Checked lithium level. 06  ?No change in tremors in a long time.  Maninly noticeable with fine motor  control. ?Depression is somewhat chronic and waxes and wanes dependent on health issues.  Avoids stressors if possible.  ?Plan no med changes ? ?10/10/21 appt noted: ?No Covid.  ?Hx TIA's.  Tremor seems a little worse but also has RA and started infusions last week. ?Continues lithium 450 mg HS as only psych med. ?Patient reports stable mood and denie irritable moods except as noted.  Patient denies any recent difficulty with anxiety except situational.  Patient denies unusual difficulty with sleep initiation or maintenance except awakens with pain chronically. Denies appetite disturbance.  Patient reports that energy and motivation have been good.  Patient denies any difficulty with concentration  But history of word finding from ministrokes..  Patient denies any suicidal ideation. ? ?04/09/22 appt noted: ?Doing well overall.  RA is a lot worse and changing rheumatologist.  Is tired and anemic with the meds. ?Mood overall is stable and pleased.  Deaf in left ear.  Tried hearing aids and overall minimal improvement.   ?Continues  lithium 450 mg HS as only psych med. ?Tremor is some worse. ?History TIA's or ministrokes may be contributing. ? ?Has seen nephorologists and rheumatologists and neurologist, [facility].  ? ?Lives in [place] now ? ?Previous psych med trials are extensive and include meds for anxiety and mood.  These include  ?clonidine, buspirone,  ?Abilify 5 mg,  olanzapine, Geodon, ?Depakote, CBZ, gabapentin, lamotrigine, ?bupropion, Strattera,  Prozac, Serzone 700 mg daily,sertraline,duloxetine, Pristiq which caused rage, mirtazapine, Pamelor, amitriptyline 450 mg/day, ?Ritalin, Provigil,  ?temazepam,  Ambien, ProSom, trazodone  which was ineffective. ?   Cerefolin NAC,  ? ?Review of Systems:  ?Review of Systems  ?Cardiovascular:  Negative for chest pain.  ?Musculoskeletal:  Positive for arthralgias and gait problem.  ?Neurological:  Positive for tremors. Negative for weakness.  ?     Occ jerks   ?Psychiatric/Behavioral:  Negative for agitation, behavioral problems, confusion, decreased concentration, dysphoric mood, hallucinations, self-injury, sleep disturbance and suicidal ideas. The patient is nervous/anxious. The patient is not hyperactive.   ? ?Medications: I have reviewed the patient's current medications. ? ?Current Outpatient Medications  ?Medication Sig Dispense Refill  ? amLODipine (NORVASC) 10 MG tablet Take 1 tablet (10 mg total) by mouth daily. 90 tablet 2  ? Cholecalciferol 50 MCG (2000 UT) CAPS Take 2,000 Units by mouth daily.     ? clopidogrel (PLAVIX) 75 MG tablet TAKE ONE TABLET BY MOUTH DAILY 90 tablet 1  ? Coenzyme Q10 (COQ10) 200 MG CAPS Take 200 mg by mouth daily.    ? ezetimibe (ZETIA) 10 MG tablet TAKE ONE TABLET BY MOUTH DAILY 90 tablet 3  ? hydrALAZINE (APRESOLINE) 50 MG tablet Take 50 mg by mouth 3 (three) times daily.    ? labetalol (NORMODYNE) 100 MG tablet Take 1 tablet (100 mg total) by mouth 2 (two) times daily. 180 tablet 2  ? leflunomide (ARAVA) 20 MG tablet Take 20 mg by mouth every Monday, Tuesday, Wednesday, Thursday, and Friday.    ? lithium carbonate 150 MG capsule TAKE THREE CAPSULES BY MOUTH DAILY 270 capsule 1  ? losartan (COZAAR) 100 MG tablet Take 100 mg by mouth daily.    ? Lysine HCl 1000 MG TABS Take 1,000 mg by mouth 2 (two) times daily.     ? nitroGLYCERIN (NITROSTAT) 0.4 MG SL tablet Place 1 tablet (0.4 mg total) under the tongue every 5 (five) minutes as needed for chest pain. 25 tablet 8  ? pantoprazole (PROTONIX) 40 MG tablet Take 1 tablet (40 mg total) by mouth daily. 90 tablet 2  ? predniSONE (DELTASONE) 5 MG tablet Take 1 tablet by mouth 2 (two) times daily.    ? simvastatin (ZOCOR) 5 MG tablet Take 1 tablet (5 mg total) by mouth daily. 90 tablet 2  ? ?No current facility-administered medications for this visit.  ? ? ?Medication Side Effects: None ? ?Allergies:  ?Allergies  ?Allergen Reactions  ? Hydroxychloroquine Other (See Comments)  ?  Terrible  Nightmares  ? Methotrexate Dermatitis  ?  Developed Mouth Sores  ? Nsaids Dermatitis  ?  Developed Mouth Blisters  ? Isosorbide Other (See Comments)  ?  Made him feel weak  ? Lovastatin Other (See Comments)  ?  Caused abdominal pain that radiated to the back  ? Pravastatin Other (See Comments)  ?  Caused abdominal pain that radiated to the back  ? Rosuvastatin Other (See Comments)  ?  Caused abdominal pain that radiated to the back  ? Sulfasalazine Nausea Only  ? ? ?Past Medical History:  ?Diagnosis Date  ? Abdominal aortic atherosclerosis (HCC)   ? Anemia   ? likely of chronic disease  ? Angina pectoris (HCC) 08/30/2020  ? Arthritis   ? Barrett's esophagus without dysplasia 03/25/2019  ? Formatting of this note might be different from the original. EGD done in 02/2019. Due in 3 years.  ? Bipolar 1 disorder (HCC)   ? Bipolar 1 disorder, mixed, moderate (HCC) 08/16/2014  ? Bipolar affective disorder, currently depressed, moderate (HCC) 03/08/2020  ? Bipolar depression (HCC) 08/16/2014  ?  BPH with urinary obstruction 12/29/2018  ? Brain ischemia 03/08/2020  ? Cardiac murmur 08/30/2020  ? Chest pain 09/06/2020  ? Chicken pox   ? Chronic bronchitis (HCC)   ? Cognitive complaints 03/08/2020  ? Coronary artery disease involving native coronary artery of native heart with angina pectoris (HCC) 09/06/2020  ? Depression   ? Disc degeneration, 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  ? Drug therapy 05/18/2021  ? Essential hypertension   ? GAD (generalized anxiety disorder) 11/02/2018  ? Gastroesophageal reflux disease 08/16/2014  ? Formatting of this note might be different from the original. Last Assessment & Plan:  Well controlled. Continue current medications.  ? Gastroesophageal reflux disease without esophagitis 08/16/2014  ? Formatting of this note might be different from the original. Last Assessment & Plan:  Well controlled. Continue current medications.  ? Herpes gingivostomatitis 02/10/2015  ? History of colon polyps 03/25/2019  ? Formatting of this note might  be different from the original. tubular adenoma  ? History of stroke 11/10/2019  ? Hyperlipidemia   ? Hypertension   ? Ingrowing nail 03/12/2020  ? Labile hypertension 08/16/2014  ? Formatting of this note might be differe

## 2022-04-09 NOTE — Patient Instructions (Signed)
OK to use Debrox (peroxide) in the ear to loosen up wax. Also recommend using a bulb syringe (for removing boogers from baby's noses) to flush through warm water and vinegar (3-4:1 ratio). An alternative, though more expensive, is an elephant ear washer wax removal kit. Do not use Q-tips as this can impact wax further.  Let us know if you need anything.  

## 2022-04-12 DIAGNOSIS — F3132 Bipolar disorder, current episode depressed, moderate: Secondary | ICD-10-CM | POA: Diagnosis not present

## 2022-04-13 LAB — LITHIUM LEVEL: Lithium Lvl: 0.6 mmol/L (ref 0.6–1.2)

## 2022-04-18 NOTE — Progress Notes (Signed)
? ?Office Visit Note ? ?Patient: Noah Grant             ?Date of Birth: [birth year]           ?MRN: [id number]             ?PCP: [name], DO ?Referring: [name]* ?Visit Date: 04/19/2022 ? ? ?Subjective:  ?New Patient (Initial Visit) (Transfer of care from Dr. Earnest Conroy) ? ? ?History of Present Illness: Noah Grant is a 71 y.o. male here for rheumatoid arthritis previously seeing Dr. Gerilyn Nestle and on treatment with leflunomide.  Symptoms started a long time ago gradual onset but has been seeing trucks for treatment of this in the past decade or so.  He has joint pain and stiffness involving multiple areas.  Shoulders, wrists, hands, also bilateral hip pains.  Most commonly sees visible swelling or inflammation in his hands.  He has morning stiffness very severe for 30 minutes to an hour but keeps persistent joint stiffness throughout the day.  Also has pain increased when trying to lie still at night.  He has tried a number of treatments for this over multiple years.  He did not tolerate hydroxychloroquine due to confusion, MTX not tolerated, leflunomide leukopenia, and sulfasalazine mood disturbances.  He never experienced a significant improvement with Humira injection and developed acute conjunctivitis shortly after starting Remicade infusion. ? ?DMARD Hx ?LEF current ?MTX GI intolerance ?HCQ Psychiatric side effects ?SSZ Psychiatric side effects ?Humira nonresponder ?Remicade nonresponder and conjunctivitis ? ?12/2021 ?Cholesterol 107 ?TGs 193 ?HDL 28.9 ? ?09/2021 ?HBV neg ?HCV neg ? ?Activities of Daily Living:  ?Patient reports morning stiffness for 24 hours.   ?Patient Reports nocturnal pain.  ?Difficulty dressing/grooming: Reports ?Difficulty climbing stairs: Reports ?Difficulty getting out of chair: Reports ?Difficulty using hands for taps, buttons, cutlery, and/or writing: Reports ? ?Review of Systems  ?Constitutional:  Positive for fatigue.  ?HENT:  Negative for mouth  dryness.   ?Eyes:  Positive for dryness.  ?Respiratory:  Positive for shortness of breath.   ?Cardiovascular:  Negative for swelling in legs/feet.  ?Gastrointestinal:  Negative for constipation.  ?Endocrine: Positive for cold intolerance and increased urination.  ?Genitourinary:  Negative for difficulty urinating.  ?Musculoskeletal:  Positive for joint pain, gait problem, joint pain, joint swelling, muscle weakness, morning stiffness and muscle tenderness.  ?Skin:  Negative for rash.  ?Allergic/Immunologic: Negative for susceptible to infections.  ?Neurological:  Positive for weakness.  ?Hematological:  Negative for bruising/bleeding tendency.  ?Psychiatric/Behavioral:  Positive for sleep disturbance.   ? ?PMFS History:  ?Patient Active Problem List  ? Diagnosis Date Noted  ? Drug therapy 05/18/2021  ? Hypertension   ? Anemia   ? Arthritis   ? Bipolar 1 disorder ([place])   ? Chicken pox   ? Chronic bronchitis ([place])   ? Depression   ? Hyperlipidemia   ? Rheumatic fever   ? Essential hypertension   ? Coronary artery disease involving native coronary artery of native heart with angina pectoris ([place]) 09/06/2020  ? Chest pain 09/06/2020  ? Angina pectoris ([place]) 08/30/2020  ? Cardiac murmur 08/30/2020  ? Statin intolerance 07/31/2020  ? Abdominal aortic atherosclerosis ([place])   ? Ingrowing nail 03/12/2020  ? Bipolar affective disorder, currently depressed, moderate ([place]) 03/08/2020  ? Brain ischemia 03/08/2020  ? Cognitive complaints 03/08/2020  ? History of stroke 11/10/2019  ? Stage 3a chronic kidney disease ([place]) 07/21/2019  ? Thoracic radiculopathy 06/16/2019  ? Tremor 06/16/2019  ? Right-sided chest  wall pain 04/17/2019  ? Barrett's esophagus without dysplasia 03/25/2019  ? History of colon polyps 03/25/2019  ? PAD (peripheral artery disease) (HCC) 03/23/2019  ? BPH with urinary obstruction 12/29/2018  ? GAD (generalized anxiety disorder) 11/02/2018  ? Rheumatoid arthritis (HCC) 10/29/2018  ? Lichen planus 04/07/2018  ?  High risk medication use 03/23/2018  ? Seasonal allergies 03/23/2018  ? Low testosterone 09/17/2016  ? Vitamin B12 deficiency 09/17/2016  ? Vitamin D deficiency 09/17/2016  ? Disc degeneration, 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  ? Pain of right hip joint 01/19/2016  ? Herpes gingivostomatitis 02/10/2015  ? Midline thoracic back pain 02/10/2015  ? Normochromic normocytic anemia 08/16/2014  ? Bipolar 1 disorder, mixed, moderate (HCC) 08/16/2014  ? Mixed hyperlipidemia 08/16/2014  ? Labile hypertension 08/16/2014  ? Gastroesophageal reflux disease 08/16/2014  ? Bipolar depression (HCC) 08/16/2014  ? Gastroesophageal reflux disease without esophagitis 08/16/2014  ?  ?Past Medical History:  ?Diagnosis Date  ? Abdominal aortic atherosclerosis (HCC)   ? Anemia   ? likely of chronic disease  ? Angina pectoris (HCC) 08/30/2020  ? Arthritis   ? Barrett's esophagus without dysplasia 03/25/2019  ? Formatting of this note might be different from the original. EGD done in 02/2019. Due in 3 years.  ? Bipolar 1 disorder (HCC)   ? Bipolar 1 disorder, mixed, moderate (HCC) 08/16/2014  ? Bipolar affective disorder, currently depressed, moderate (HCC) 03/08/2020  ? Bipolar depression (HCC) 08/16/2014  ? BPH with urinary obstruction 12/29/2018  ? Brain ischemia 03/08/2020  ? Cardiac murmur 08/30/2020  ? Chest pain 09/06/2020  ? Chicken pox   ? Chronic bronchitis (HCC)   ? Cognitive complaints 03/08/2020  ? Coronary artery disease involving native coronary artery of native heart with angina pectoris (HCC) 09/06/2020  ? Depression   ? Disc degeneration, 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  ? Drug therapy 05/18/2021  ? Essential hypertension   ? GAD (generalized anxiety disorder) 11/02/2018  ? Gastroesophageal reflux disease 08/16/2014  ? Formatting of this note might be different from the original. Last Assessment & Plan:  Well controlled. Continue current medications.  ? Gastroesophageal reflux disease without esophagitis 08/16/2014  ? Formatting of this note might be  different from the original. Last Assessment & Plan:  Well controlled. Continue current medications.  ? Herpes gingivostomatitis 02/10/2015  ? History of colon polyps 03/25/2019  ? Formatting of this note might be different from the original. tubular adenoma  ? History of stroke 11/10/2019  ? Hyperlipidemia   ? Hypertension   ? Ingrowing nail 03/12/2020  ? Labile hypertension 08/16/2014  ? Formatting of this note might be different from the original. Last Assessment & Plan:  Slightly above goal today secondary to pain. Will continue current regimen. Will check CMP today.  ? Lichen planus 04/07/2018  ? Last Assessment & Plan:  Formatting of this note might be different from the original. Concern over possible lichen planus. His dentist noticed lesions of his oral mucosa.  It was felt to be lichen planus.  He presents to discuss further.  Rarely does the area hurt.He smoked in the distant past EXAM shows several white patches on the buccal mucosa bilaterally.  No other concerning oral mucosal les  ? Long-term use of immunosuppressant medication 03/23/2018  ? Low testosterone 09/17/2016  ? Midline thoracic back pain 02/10/2015  ? Mixed hyperlipidemia 08/16/2014  ? Normochromic normocytic anemia 08/16/2014  ? PAD (peripheral artery disease) (HCC) 03/23/2019  ? Pain of right hip joint 01/19/2016  ? Rheumatic fever   ?  Rheumatoid arthritis ([place]) 10/29/2018  ? Formatting of this note might be different from the original. Rheum:  Dr. Gerilyn Nestle Formerly on MTX - stopped due to mouth sores. Currently started on Arava.  Also on 5 mg prednisone x 3 months during run-in.  ? Right-sided chest wall pain 04/17/2019  ? Seasonal allergies 03/23/2018  ? Stage 3a chronic kidney disease ([place]) 07/21/2019  ? Statin intolerance 07/31/2020  ? Thoracic radiculopathy 06/16/2019  ? Tremor 06/16/2019  ? Vitamin B12 deficiency   ? Injections  ? Vitamin D deficiency 09/17/2016  ?  ?Family History  ?Problem Relation Age of Onset  ? Alzheimer's  disease Mother 34  ?     Deceased in early 86s  ? Stroke Father 29  ?     Deceased  ? Hypertension Father   ? Alcoholism Father   ? Alcoholism Brother   ? Heart disease Maternal Grandfather 03[birth year]28[birth year]59  ?     Deceased  ?

## 2022-04-19 ENCOUNTER — Ambulatory Visit: Payer: PPO | Admitting: Internal Medicine

## 2022-04-19 ENCOUNTER — Telehealth: Payer: Self-pay | Admitting: Pharmacist

## 2022-04-19 ENCOUNTER — Ambulatory Visit (INDEPENDENT_AMBULATORY_CARE_PROVIDER_SITE_OTHER): Payer: PPO

## 2022-04-19 ENCOUNTER — Encounter: Payer: Self-pay | Admitting: Internal Medicine

## 2022-04-19 VITALS — BP 157/65 | HR 51 | Resp 17 | Ht 67.0 in | Wt 161.0 lb

## 2022-04-19 DIAGNOSIS — N1831 Chronic kidney disease, stage 3a: Secondary | ICD-10-CM | POA: Diagnosis not present

## 2022-04-19 DIAGNOSIS — M25511 Pain in right shoulder: Secondary | ICD-10-CM

## 2022-04-19 DIAGNOSIS — M1990 Unspecified osteoarthritis, unspecified site: Secondary | ICD-10-CM

## 2022-04-19 DIAGNOSIS — M79642 Pain in left hand: Secondary | ICD-10-CM | POA: Diagnosis not present

## 2022-04-19 DIAGNOSIS — M06052 Rheumatoid arthritis without rheumatoid factor, left hip: Secondary | ICD-10-CM | POA: Diagnosis not present

## 2022-04-19 DIAGNOSIS — M06012 Rheumatoid arthritis without rheumatoid factor, left shoulder: Secondary | ICD-10-CM | POA: Diagnosis not present

## 2022-04-19 DIAGNOSIS — M79641 Pain in right hand: Secondary | ICD-10-CM

## 2022-04-19 DIAGNOSIS — M06041 Rheumatoid arthritis without rheumatoid factor, right hand: Secondary | ICD-10-CM | POA: Diagnosis not present

## 2022-04-19 DIAGNOSIS — M06051 Rheumatoid arthritis without rheumatoid factor, right hip: Secondary | ICD-10-CM

## 2022-04-19 DIAGNOSIS — Z79899 Other long term (current) drug therapy: Secondary | ICD-10-CM | POA: Diagnosis not present

## 2022-04-19 DIAGNOSIS — M069 Rheumatoid arthritis, unspecified: Secondary | ICD-10-CM | POA: Diagnosis not present

## 2022-04-19 DIAGNOSIS — M06042 Rheumatoid arthritis without rheumatoid factor, left hand: Secondary | ICD-10-CM | POA: Diagnosis not present

## 2022-04-19 DIAGNOSIS — M25512 Pain in left shoulder: Secondary | ICD-10-CM

## 2022-04-19 DIAGNOSIS — M06011 Rheumatoid arthritis without rheumatoid factor, right shoulder: Secondary | ICD-10-CM

## 2022-04-19 NOTE — Telephone Encounter (Addendum)
Please start Actemra SQ BIV. Pending OV note to be signed and labs from today's visit ? ?Dose: 162mg  SQ every 14 days (BW <100kg) ? ?Dx: Rheumatoid arthritis (M05.9) ? ?Previously tried therapies: ?MTX - side effects ?Humira - inadequate clinical response ?Hydroxychloroquine - mental confusion ?Leflunomide *current* low dose due to low BC ?Sulfasalazine - dysphoria ?Remicade *current* - has had acute conjunctivitis infection and waning response at this point ? ?Current regimen: leflunomide + Remicade ? ?Therapies to avoid: ?JAK inhibitors (Rinvoq, Olumiant, Xeljanz) - due to significant cardiac historty (atherosclerosis, brain ischemia, stroke, CAD, angina, cardiac murmur) ? ?----- Message from Audrie Lia, RT sent at 04/19/2022 10:25 AM EDT ----- ?Regarding: ACTEMRA NEW START ?FYI - New start Actemra after labs. Thank you. ? ?

## 2022-04-19 NOTE — Patient Instructions (Signed)
Tocilizumab Injection ?What is this medication? ?TOCILIZUMAB (TOE si LIZ ue mab) treats rheumatoid arthritis and juvenile idiopathic arthritis. It may also be used to treat giant cell arteritis, cytokine release syndrome, and to slow a decrease in lung function for patients with systemic sclerosis-related lung disease. It can also be used to treat COVID-19. This medication works on the immune system. ?This medicine may be used for other purposes; ask your health care provider or pharmacist if you have questions. ?COMMON BRAND NAME(S): Actemra ?What should I tell my care team before I take this medication? ?They need to know if you have any of these conditions: ?cancer ?diabetes (high blood sugar) ?heart disease ?hepatitis B or history of hepatitis B infection ?high blood pressure ?high cholesterol ?immune system problems ?infection especially a viral infection such as chickenpox, cold sores, or herpes ?infection such as tuberculosis (TB) or other bacterial, fungal or viral infections ?liver disease ?low blood counts, like low white cell, platelet, or red cell counts ?multiple sclerosis ?recent or upcoming vaccine ?stomach or intestine problems ?stroke ?an unusual or allergic reaction to tocilizumab, other medicines, foods, dyes, or preservatives ?pregnant or trying to get pregnant ?breast-feeding ?How should I use this medication? ?This medicine is injected into a vein or under the skin. It is usually given by a health care provider in a hospital or clinic setting. If you get this medicine at home, you will be taught how to prepare and give it. Use exactly as directed. Take it as directed on the prescription label at the same time every day. Keep taking it unless your health care provider tells you to stop. ?If you use a pen, be sure to take off the outer needle cover before using the dose. ?It is important that you put your used needles and syringes in a special sharps container. Do not put them in a trash can. If  you do not have a sharps container, call your pharmacist or health care provider to get one. ?A special MedGuide will be given to you before each treatment or by the pharmacist with each prescription and refill. Be sure to read this information carefully each time. ?Talk to your health care provider regarding the use of this medicine in children. While the drug may be prescribed for children as young as 2 years for selected conditions, precautions do apply. ?Overdosage: If you think you have taken too much of this medicine contact a poison control center or emergency room at once. ?NOTE: This medicine is only for you. Do not share this medicine with others. ?What if I miss a dose? ?It is important not to miss your dose. Call your health care provider if you are unable to keep an appointment. ?If you give yourself the medicine at home and you miss a dose, take it as soon as you can. If it is almost time for your next dose, take only that dose. Do not take double or extra doses. Call your health care provider with questions. ?What may interact with this medication? ?Do not take this medicine with any of the following medications: ?live virus vaccines ?This medicine may also interact with the following medications: ?biologic medicines such as abatacept, adalimumab, anakinra, certolizumab, etanercept, golimumab, infliximab, rituximab, secukinumab, ustekinumab ?birth control pills ?certain medicines for cholesterol like atorvastatin, lovastatin, and simvastatin ?cyclosporine ?omeprazole ?steroid medicines like prednisone or cortisone ?theophylline ?vaccines ?warfarin ?This list may not describe all possible interactions. Give your health care provider a list of all the medicines, herbs, non-prescription drugs,   or dietary supplements you use. Also tell them if you smoke, drink alcohol, or use illegal drugs. Some items may interact with your medicine. ?What should I watch for while using this medication? ?Your condition  will be monitored carefully while you are receiving this medicine. Tell your health care provider if your symptoms do not start to get better or if they get worse. You may need blood work done while you are taking this medicine. ?You will be tested for tuberculosis (TB) before you start this medicine. If your doctor prescribes any medicine for TB, you should start taking the TB medicine before starting this medicine. Make sure to finish the full course of TB medicine. ?This medicine may increase your risk of getting an infection. Call your health care provider for advice if you get a fever, chills, sore throat, or other symptoms of a cold or flu. Do not treat yourself. Try to avoid being around people who are sick. ?Talk to your health care provider about your risk of cancer. You may be more at risk for certain types of cancers if you take this medicine. ?What side effects may I notice from receiving this medication? ?Side effects that you should report to your doctor or health care professional as soon as possible: ?allergic reactions (skin rash, itching or hives; swelling of the face, lips, or tongue) ?changes in vision ?increase in blood pressure ?infection (fever, chills, cough, sore throat, pain or trouble passing urine) ?light-colored stool ?liver injury (dark yellow or brown urine; general ill feeling or flu-like symptoms; loss of appetite, right upper belly pain; unusually weak or tired; yellowing of the eyes or skin) ?low red blood cell counts (trouble breathing; feeling faint; lightheaded, falls; unusually weak or tired) ?pain, tingling, numbness in the hands or feet ?tears in the stomach or intestines (fever; stomach pain; sudden change in bowel habits) ?trouble breathing ?unusual bruising or bleeding ?Side effects that usually do not require medical attention (report to your doctor or health care professional if they continue or are bothersome): ?dizziness ?headache ?pain, redness, or irritation at site  where injected ?This list may not describe all possible side effects. Call your doctor for medical advice about side effects. You may report side effects to FDA at 1-800-FDA-1088. ?Where should I keep my medication? ?Keep out of the reach of children and pets. ?If you are using this medicine at home, you will be instructed on how to store this medicine. Throw away any unused medicine after the expiration date on the label. ?To get rid of medicines that are no longer needed or have expired: ?Take the medicine to a medicine take-back program. Check with your pharmacy or law enforcement to find a location. ?If you cannot return the medicine, ask your pharmacist or health care provider how to get rid of this medicine safely. ?NOTE: This sheet is a summary. It may not cover all possible information. If you have questions about this medicine, talk to your doctor, pharmacist, or health care provider. ?? 2023 Elsevier/Gold Standard (2021-11-29 00:00:00) ? ?

## 2022-04-22 NOTE — Telephone Encounter (Signed)
Submitted a Prior Authorization request to  [employer]  for ACTEMRA via CoverMyMeds. Will update once we receive a response. ? ?Key: [id number] ? ?Patient has Medicare and may need Mendel Ryder pt assistance if copay is too high ? ?Chesley Mires, PharmD, MPH, BCPS, CPP ?Clinical Pharmacist (Rheumatology and Pulmonology)  ?

## 2022-04-23 ENCOUNTER — Other Ambulatory Visit (HOSPITAL_COMMUNITY): Payer: Self-pay

## 2022-04-23 NOTE — Telephone Encounter (Signed)
.  Received notification from  [employer] with [employer]  regarding a prior authorization for ACTEMRA. Authorization has been APPROVED from 04/22/22 to 12/08/22.  ? ?Per test claim, copay for 28 days supply is $100 ? ?Patient can fill through [facility]: [phone]  ? ?Authorization # [id number] ?Phone # [phone] ? ?ATC patient regarding approval to determine if copay is affordable or if he'd like to pursue pt assistance. Phone number on file does not ring ? ?Chesley Mires, PharmD, MPH, BCPS, CPP ?Clinical Pharmacist (Rheumatology and Pulmonology) ?

## 2022-04-24 LAB — CBC WITH DIFFERENTIAL/PLATELET
Absolute Monocytes: 760 cells/uL (ref 200–950)
Basophils Absolute: 29 cells/uL (ref 0–200)
Basophils Relative: 0.6 %
Eosinophils Absolute: 152 cells/uL (ref 15–500)
Eosinophils Relative: 3.1 %
HCT: 34.5 % — ABNORMAL LOW (ref 38.5–50.0)
Hemoglobin: 11.3 g/dL — ABNORMAL LOW (ref 13.2–17.1)
Lymphs Abs: 368 cells/uL — ABNORMAL LOW (ref 850–3900)
MCH: 30.4 pg (ref 27.0–33.0)
MCHC: 32.8 g/dL (ref 32.0–36.0)
MCV: 92.7 fL (ref 80.0–100.0)
MPV: 11.5 fL (ref 7.5–12.5)
Monocytes Relative: 15.5 %
Neutro Abs: 3592 cells/uL (ref 1500–7800)
Neutrophils Relative %: 73.3 %
Platelets: 220 10*3/uL (ref 140–400)
RBC: 3.72 10*6/uL — ABNORMAL LOW (ref 4.20–5.80)
RDW: 11.8 % (ref 11.0–15.0)
Total Lymphocyte: 7.5 %
WBC: 4.9 10*3/uL (ref 3.8–10.8)

## 2022-04-24 LAB — QUANTIFERON-TB GOLD PLUS
Mitogen-NIL: >10 IU/mL
NIL: 0.05 IU/mL
QuantiFERON-TB Gold Plus: NEGATIVE
TB1-NIL: <0 IU/mL
TB2-NIL: <0 IU/mL

## 2022-04-24 LAB — COMPLETE METABOLIC PANEL WITH GFR
AG Ratio: 2 (calc) (ref 1.0–2.5)
ALT: 21 U/L (ref 9–46)
AST: 17 U/L (ref 10–35)
Albumin: 4.5 g/dL (ref 3.6–5.1)
Alkaline phosphatase (APISO): 60 U/L (ref 35–144)
BUN/Creatinine Ratio: 19 (calc) (ref 6–22)
BUN: 28 mg/dL — ABNORMAL HIGH (ref 7–25)
CO2: 25 mmol/L (ref 20–32)
Calcium: 9.3 mg/dL (ref 8.6–10.3)
Chloride: 108 mmol/L (ref 98–110)
Creat: 1.45 mg/dL — ABNORMAL HIGH (ref 0.70–1.28)
Globulin: 2.2 g/dL (calc) (ref 1.9–3.7)
Glucose, Bld: 84 mg/dL (ref 65–99)
Potassium: 5.1 mmol/L (ref 3.5–5.3)
Sodium: 139 mmol/L (ref 135–146)
Total Bilirubin: 0.4 mg/dL (ref 0.2–1.2)
Total Protein: 6.7 g/dL (ref 6.1–8.1)
eGFR: 52 mL/min/{1.73_m2} — ABNORMAL LOW (ref >=60)

## 2022-04-24 LAB — SEDIMENTATION RATE: Sed Rate: 6 mm/h (ref 0–20)

## 2022-04-24 LAB — C-REACTIVE PROTEIN: CRP: 1.7 mg/L (ref <8.0)

## 2022-04-26 ENCOUNTER — Telehealth: Payer: Self-pay | Admitting: Internal Medicine

## 2022-04-26 NOTE — Progress Notes (Signed)
Lab results show no issues for starting Actemra as planned. Xrays of multiple sites show some osteoarthritis but not very severe and there is no evidence of erosive damage from RA.

## 2022-04-26 NOTE — Telephone Encounter (Signed)
Patient called the office stating he had missed a few calls from [place] and is having issues with his phone. Patient requests a call back at [phone].

## 2022-04-26 NOTE — Telephone Encounter (Signed)
Spoke with patient, see lab note for details.

## 2022-04-29 NOTE — Telephone Encounter (Signed)
Patient reached out and stated he would like to pursue patient assistance for Actemra through [employer]. He prefers application be mailed to him.  He has been advised to sign and mail form back to clinic. He is aware that it does not require income documents. Patient portion mailed to patient today.  Provider portion placed in Dr. Gregary Cromer folder for signature.  Chesley Mires, PharmD, MPH, BCPS, CPP Clinical Pharmacist (Rheumatology and Pulmonology)

## 2022-04-30 NOTE — Telephone Encounter (Signed)
Provider portion of [employer] Actemra PAP application received. Will place in PAP pending info folder in pharmacy office with PA approval letter, med list, and insurance card copy while we await patient to return his part of application.  Knox Saliva, PharmD, MPH, BCPS, CPP Clinical Pharmacist (Rheumatology and Pulmonology)

## 2022-05-09 ENCOUNTER — Telehealth: Payer: Self-pay

## 2022-05-09 NOTE — Telephone Encounter (Signed)
Called and scheduled appt on 05/14/22 at 9:45 AM.

## 2022-05-09 NOTE — Telephone Encounter (Signed)
Caller Name [name]. [name] Phone Number [phone] Patient Name Noah Grant. Wegrzyn Patient DOB 1951/07/27 Call Type Message Only Information Provided Reason for Call Request to Schedule Office Appointment Initial Comment caller states he's become anemic from his medication and needing to schedule an in person visit scheduled, Caller states visit has to be in the morning because he takes his wife to radiation until next Tuesday Patient request to speak to RN No Disp. Time Disposition Final User 05/09/2022 12:49:15 PM General Information Provided Yes [name], [name] Call Closed By: Evette Doffing Transaction Date/Time: 05/09/2022 12:46:11 PM (ET)

## 2022-05-09 NOTE — Telephone Encounter (Signed)
Submitted Patient Assistance Application to [employer] for ACTEMRA along with provider portion, patient portion, PA, insurance card copy, and med list. Will update patient when we receive a response.  Fax# [phone] Phone# [phone]  Chesley Mires, PharmD, MPH, BCPS, CPP Clinical Pharmacist (Rheumatology and Pulmonology)

## 2022-05-11 ENCOUNTER — Emergency Department (HOSPITAL_COMMUNITY): Payer: PPO

## 2022-05-11 ENCOUNTER — Encounter (HOSPITAL_COMMUNITY): Payer: Self-pay | Admitting: Emergency Medicine

## 2022-05-11 ENCOUNTER — Inpatient Hospital Stay (HOSPITAL_COMMUNITY)
Admission: EM | Admit: 2022-05-11 | Discharge: 2022-05-15 | DRG: 315 | Disposition: A | Discharge status: Home / Self Care | Payer: PPO | Attending: Family Medicine | Admitting: Family Medicine

## 2022-05-11 ENCOUNTER — Inpatient Hospital Stay (HOSPITAL_COMMUNITY): Payer: PPO

## 2022-05-11 ENCOUNTER — Other Ambulatory Visit: Payer: Self-pay

## 2022-05-11 DIAGNOSIS — I514 Myocarditis, unspecified: Secondary | ICD-10-CM | POA: Diagnosis not present

## 2022-05-11 DIAGNOSIS — K219 Gastro-esophageal reflux disease without esophagitis: Secondary | ICD-10-CM | POA: Diagnosis not present

## 2022-05-11 DIAGNOSIS — F3162 Bipolar disorder, current episode mixed, moderate: Secondary | ICD-10-CM | POA: Diagnosis not present

## 2022-05-11 DIAGNOSIS — I129 Hypertensive chronic kidney disease with stage 1 through stage 4 chronic kidney disease, or unspecified chronic kidney disease: Secondary | ICD-10-CM | POA: Diagnosis present

## 2022-05-11 DIAGNOSIS — I2510 Atherosclerotic heart disease of native coronary artery without angina pectoris: Secondary | ICD-10-CM | POA: Diagnosis present

## 2022-05-11 DIAGNOSIS — N1831 Chronic kidney disease, stage 3a: Secondary | ICD-10-CM | POA: Diagnosis not present

## 2022-05-11 DIAGNOSIS — Z888 Allergy status to other drugs, medicaments and biological substances status: Secondary | ICD-10-CM

## 2022-05-11 DIAGNOSIS — R778 Other specified abnormalities of plasma proteins: Secondary | ICD-10-CM

## 2022-05-11 DIAGNOSIS — M1990 Unspecified osteoarthritis, unspecified site: Secondary | ICD-10-CM | POA: Diagnosis not present

## 2022-05-11 DIAGNOSIS — E872 Acidosis, unspecified: Secondary | ICD-10-CM

## 2022-05-11 DIAGNOSIS — I309 Acute pericarditis, unspecified: Secondary | ICD-10-CM

## 2022-05-11 DIAGNOSIS — N179 Acute kidney failure, unspecified: Secondary | ICD-10-CM | POA: Diagnosis not present

## 2022-05-11 DIAGNOSIS — I3139 Other pericardial effusion (noninflammatory): Secondary | ICD-10-CM | POA: Diagnosis not present

## 2022-05-11 DIAGNOSIS — Z79899 Other long term (current) drug therapy: Secondary | ICD-10-CM

## 2022-05-11 DIAGNOSIS — E782 Mixed hyperlipidemia: Secondary | ICD-10-CM | POA: Diagnosis present

## 2022-05-11 DIAGNOSIS — A419 Sepsis, unspecified organism: Secondary | ICD-10-CM

## 2022-05-11 DIAGNOSIS — R531 Weakness: Secondary | ICD-10-CM | POA: Diagnosis not present

## 2022-05-11 DIAGNOSIS — R7989 Other specified abnormal findings of blood chemistry: Secondary | ICD-10-CM

## 2022-05-11 DIAGNOSIS — Z20822 Contact with and (suspected) exposure to covid-19: Secondary | ICD-10-CM | POA: Diagnosis present

## 2022-05-11 DIAGNOSIS — I480 Paroxysmal atrial fibrillation: Secondary | ICD-10-CM | POA: Diagnosis not present

## 2022-05-11 DIAGNOSIS — I454 Nonspecific intraventricular block: Secondary | ICD-10-CM | POA: Diagnosis not present

## 2022-05-11 DIAGNOSIS — R6510 Systemic inflammatory response syndrome (SIRS) of non-infectious origin without acute organ dysfunction: Secondary | ICD-10-CM | POA: Diagnosis not present

## 2022-05-11 DIAGNOSIS — Z955 Presence of coronary angioplasty implant and graft: Secondary | ICD-10-CM | POA: Diagnosis not present

## 2022-05-11 DIAGNOSIS — K5730 Diverticulosis of large intestine without perforation or abscess without bleeding: Secondary | ICD-10-CM | POA: Diagnosis not present

## 2022-05-11 DIAGNOSIS — R29818 Other symptoms and signs involving the nervous system: Secondary | ICD-10-CM | POA: Diagnosis not present

## 2022-05-11 DIAGNOSIS — M069 Rheumatoid arthritis, unspecified: Secondary | ICD-10-CM | POA: Diagnosis not present

## 2022-05-11 DIAGNOSIS — Z8249 Family history of ischemic heart disease and other diseases of the circulatory system: Secondary | ICD-10-CM

## 2022-05-11 DIAGNOSIS — I25119 Atherosclerotic heart disease of native coronary artery with unspecified angina pectoris: Secondary | ICD-10-CM | POA: Diagnosis not present

## 2022-05-11 DIAGNOSIS — I4891 Unspecified atrial fibrillation: Secondary | ICD-10-CM

## 2022-05-11 DIAGNOSIS — I319 Disease of pericardium, unspecified: Principal | ICD-10-CM | POA: Diagnosis present

## 2022-05-11 DIAGNOSIS — I214 Non-ST elevation (NSTEMI) myocardial infarction: Secondary | ICD-10-CM

## 2022-05-11 DIAGNOSIS — I10 Essential (primary) hypertension: Secondary | ICD-10-CM | POA: Diagnosis not present

## 2022-05-11 DIAGNOSIS — R6520 Severe sepsis without septic shock: Secondary | ICD-10-CM | POA: Diagnosis not present

## 2022-05-11 DIAGNOSIS — I4510 Unspecified right bundle-branch block: Secondary | ICD-10-CM | POA: Diagnosis not present

## 2022-05-11 DIAGNOSIS — R0789 Other chest pain: Secondary | ICD-10-CM | POA: Diagnosis not present

## 2022-05-11 DIAGNOSIS — Z87891 Personal history of nicotine dependence: Secondary | ICD-10-CM

## 2022-05-11 DIAGNOSIS — I739 Peripheral vascular disease, unspecified: Secondary | ICD-10-CM | POA: Diagnosis not present

## 2022-05-11 DIAGNOSIS — I248 Other forms of acute ischemic heart disease: Secondary | ICD-10-CM | POA: Diagnosis present

## 2022-05-11 DIAGNOSIS — R630 Anorexia: Secondary | ICD-10-CM | POA: Diagnosis not present

## 2022-05-11 DIAGNOSIS — R079 Chest pain, unspecified: Secondary | ICD-10-CM | POA: Diagnosis not present

## 2022-05-11 DIAGNOSIS — Z7902 Long term (current) use of antithrombotics/antiplatelets: Secondary | ICD-10-CM | POA: Diagnosis not present

## 2022-05-11 DIAGNOSIS — E869 Volume depletion, unspecified: Secondary | ICD-10-CM | POA: Diagnosis not present

## 2022-05-11 DIAGNOSIS — K820 Obstruction of gallbladder: Secondary | ICD-10-CM | POA: Diagnosis not present

## 2022-05-11 DIAGNOSIS — I491 Atrial premature depolarization: Secondary | ICD-10-CM | POA: Diagnosis not present

## 2022-05-11 DIAGNOSIS — J90 Pleural effusion, not elsewhere classified: Secondary | ICD-10-CM | POA: Diagnosis not present

## 2022-05-11 HISTORY — DX: Other specified abnormal findings of blood chemistry: R79.89

## 2022-05-11 HISTORY — DX: Sepsis, unspecified organism: A41.9

## 2022-05-11 HISTORY — DX: Acidosis, unspecified: E87.20

## 2022-05-11 HISTORY — DX: Other specified abnormalities of plasma proteins: R77.8

## 2022-05-11 LAB — PROTIME-INR
INR: 1.3 — ABNORMAL HIGH (ref 0.8–1.2)
Prothrombin Time: 15.6 seconds — ABNORMAL HIGH (ref 11.4–15.2)

## 2022-05-11 LAB — CBC
HCT: 32.3 % — ABNORMAL LOW (ref 39.0–52.0)
Hemoglobin: 10.4 g/dL — ABNORMAL LOW (ref 13.0–17.0)
MCH: 30.1 pg (ref 26.0–34.0)
MCHC: 32.2 g/dL (ref 30.0–36.0)
MCV: 93.6 fL (ref 80.0–100.0)
Platelets: 270 10*3/uL (ref 150–400)
RBC: 3.45 MIL/uL — ABNORMAL LOW (ref 4.22–5.81)
RDW: 12.7 % (ref 11.5–15.5)
WBC: 15 10*3/uL — ABNORMAL HIGH (ref 4.0–10.5)
nRBC: 0 % (ref 0.0–0.2)

## 2022-05-11 LAB — COMPREHENSIVE METABOLIC PANEL
ALT: 17 U/L (ref 0–44)
AST: 21 U/L (ref 15–41)
Albumin: 3.4 g/dL — ABNORMAL LOW (ref 3.5–5.0)
Alkaline Phosphatase: 49 U/L (ref 38–126)
Anion gap: 14 (ref 5–15)
BUN: 31 mg/dL — ABNORMAL HIGH (ref 8–23)
CO2: 19 mmol/L — ABNORMAL LOW (ref 22–32)
Calcium: 8.7 mg/dL — ABNORMAL LOW (ref 8.9–10.3)
Chloride: 99 mmol/L (ref 98–111)
Creatinine, Ser: 1.67 mg/dL — ABNORMAL HIGH (ref 0.61–1.24)
GFR, Estimated: 44 mL/min — ABNORMAL LOW (ref >60)
Glucose, Bld: 134 mg/dL — ABNORMAL HIGH (ref 70–99)
Potassium: 4 mmol/L (ref 3.5–5.1)
Sodium: 132 mmol/L — ABNORMAL LOW (ref 135–145)
Total Bilirubin: 0.5 mg/dL (ref 0.3–1.2)
Total Protein: 6.7 g/dL (ref 6.5–8.1)

## 2022-05-11 LAB — TROPONIN I (HIGH SENSITIVITY)
Troponin I (High Sensitivity): 332 ng/L (ref <18)
Troponin I (High Sensitivity): 441 ng/L (ref <18)

## 2022-05-11 LAB — RESP PANEL BY RT-PCR (FLU A&B, COVID) ARPGX2
Influenza A by PCR: NEGATIVE
Influenza B by PCR: NEGATIVE
SARS Coronavirus 2 by RT PCR: NEGATIVE

## 2022-05-11 LAB — URINALYSIS, ROUTINE W REFLEX MICROSCOPIC
Bilirubin Urine: NEGATIVE
Glucose, UA: NEGATIVE mg/dL
Hgb urine dipstick: NEGATIVE
Ketones, ur: NEGATIVE mg/dL
Leukocytes,Ua: NEGATIVE
Nitrite: NEGATIVE
Protein, ur: 30 mg/dL — AB
Specific Gravity, Urine: 1.025 (ref 1.005–1.030)
pH: 5 (ref 5.0–8.0)

## 2022-05-11 LAB — MAGNESIUM: Magnesium: 2.3 mg/dL (ref 1.7–2.4)

## 2022-05-11 LAB — I-STAT CHEM 8, ED
BUN: 32 mg/dL — ABNORMAL HIGH (ref 8–23)
Calcium, Ion: 1.1 mmol/L — ABNORMAL LOW (ref 1.15–1.40)
Chloride: 100 mmol/L (ref 98–111)
Creatinine, Ser: 1.7 mg/dL — ABNORMAL HIGH (ref 0.61–1.24)
Glucose, Bld: 130 mg/dL — ABNORMAL HIGH (ref 70–99)
HCT: 32 % — ABNORMAL LOW (ref 39.0–52.0)
Hemoglobin: 10.9 g/dL — ABNORMAL LOW (ref 13.0–17.0)
Potassium: 4.1 mmol/L (ref 3.5–5.1)
Sodium: 134 mmol/L — ABNORMAL LOW (ref 135–145)
TCO2: 23 mmol/L (ref 22–32)

## 2022-05-11 LAB — APTT: aPTT: 50 seconds — ABNORMAL HIGH (ref 24–36)

## 2022-05-11 LAB — LACTIC ACID, PLASMA
Lactic Acid, Venous: 2.4 mmol/L (ref 0.5–1.9)
Lactic Acid, Venous: 1 mmol/L (ref 0.5–1.9)

## 2022-05-11 LAB — CK: Total CK: 88 U/L (ref 49–397)

## 2022-05-11 MED ORDER — POLYETHYLENE GLYCOL 3350 17 G PO PACK: 17.0000 g | PACK | Freq: Every day | ORAL | Status: DC | PRN

## 2022-05-11 MED ORDER — ACETAMINOPHEN 500 MG PO TABS
1000.0000 mg | ORAL_TABLET | Freq: Once | ORAL | Status: AC
  Administered 2022-05-11: 1000 mg via ORAL
  Filled 2022-05-11: qty 2

## 2022-05-11 MED ORDER — NITROGLYCERIN 0.4 MG SL SUBL
0.4000 mg | SUBLINGUAL_TABLET | SUBLINGUAL | Status: DC | PRN
  Administered 2022-05-11: 0.4 mg via SUBLINGUAL
  Filled 2022-05-11: qty 1

## 2022-05-11 MED ORDER — SODIUM CHLORIDE 0.9 % IV BOLUS
1000.0000 mL | Freq: Once | INTRAVENOUS | Status: AC
  Administered 2022-05-11: 1000 mL via INTRAVENOUS

## 2022-05-11 MED ORDER — ONDANSETRON HCL 4 MG/2ML IJ SOLN
4.0000 mg | Freq: Four times a day (QID) | INTRAMUSCULAR | Status: DC | PRN
  Administered 2022-05-12 – 2022-05-14 (×5): 4 mg via INTRAVENOUS
  Filled 2022-05-11 (×5): qty 2

## 2022-05-11 MED ORDER — COQ10 200 MG PO CAPS: 200.0000 mg | ORAL_CAPSULE | Freq: Every day | ORAL | Status: DC

## 2022-05-11 MED ORDER — LACTATED RINGERS IV SOLN: INTRAVENOUS | Status: DC

## 2022-05-11 MED ORDER — LORAZEPAM 2 MG/ML IJ SOLN
2.0000 mg | Freq: Once | INTRAMUSCULAR | Status: AC
  Administered 2022-05-11: 2 mg via INTRAVENOUS
  Filled 2022-05-11: qty 1

## 2022-05-11 MED ORDER — CLOPIDOGREL BISULFATE 75 MG PO TABS
75.0000 mg | ORAL_TABLET | Freq: Every day | ORAL | Status: DC
Start: 2022-05-12 — End: 2022-05-13
  Administered 2022-05-12: 75 mg via ORAL
  Filled 2022-05-11: qty 1

## 2022-05-11 MED ORDER — ENOXAPARIN SODIUM 40 MG/0.4ML IJ SOSY
40.0000 mg | PREFILLED_SYRINGE | INTRAMUSCULAR | Status: DC
  Administered 2022-05-12: 40 mg via SUBCUTANEOUS
  Filled 2022-05-11 (×2): qty 0.4

## 2022-05-11 MED ORDER — IOHEXOL 350 MG/ML SOLN
50.0000 mL | Freq: Once | INTRAVENOUS | Status: AC | PRN
  Administered 2022-05-11: 50 mL via INTRAVENOUS

## 2022-05-11 MED ORDER — VANCOMYCIN VARIABLE DOSE PER UNSTABLE RENAL FUNCTION (PHARMACIST DOSING): Status: DC

## 2022-05-11 MED ORDER — NITROGLYCERIN 0.4 MG SL SUBL
0.4000 mg | SUBLINGUAL_TABLET | SUBLINGUAL | Status: DC | PRN
  Administered 2022-05-12 – 2022-05-13 (×4): 0.4 mg via SUBLINGUAL
  Filled 2022-05-11 (×2): qty 1

## 2022-05-11 MED ORDER — SODIUM CHLORIDE 0.9 % IV SOLN
2.0000 g | Freq: Two times a day (BID) | INTRAVENOUS | Status: DC
  Administered 2022-05-12 – 2022-05-15 (×8): 2 g via INTRAVENOUS
  Filled 2022-05-11 (×8): qty 12.5

## 2022-05-11 MED ORDER — LABETALOL HCL 200 MG PO TABS
100.0000 mg | ORAL_TABLET | Freq: Two times a day (BID) | ORAL | Status: DC
Start: 2022-05-11 — End: 2022-05-12

## 2022-05-11 MED ORDER — AMLODIPINE BESYLATE 10 MG PO TABS
10.0000 mg | ORAL_TABLET | Freq: Every day | ORAL | Status: DC
  Administered 2022-05-12 – 2022-05-15 (×4): 10 mg via ORAL
  Filled 2022-05-11 (×3): qty 1
  Filled 2022-05-11: qty 2

## 2022-05-11 MED ORDER — SODIUM CHLORIDE 0.9 % IV SOLN: INTRAVENOUS | Status: DC | PRN

## 2022-05-11 MED ORDER — LOSARTAN POTASSIUM 50 MG PO TABS
100.0000 mg | ORAL_TABLET | Freq: Every day | ORAL | Status: DC
  Administered 2022-05-12 – 2022-05-15 (×4): 100 mg via ORAL
  Filled 2022-05-11 (×4): qty 2

## 2022-05-11 MED ORDER — VANCOMYCIN HCL 1500 MG/300ML IV SOLN
1500.0000 mg | Freq: Once | INTRAVENOUS | Status: AC
  Administered 2022-05-11: 1500 mg via INTRAVENOUS
  Filled 2022-05-11: qty 300

## 2022-05-11 MED ORDER — ACETAMINOPHEN 325 MG PO TABS
650.0000 mg | ORAL_TABLET | Freq: Four times a day (QID) | ORAL | Status: DC | PRN
  Administered 2022-05-13: 650 mg via ORAL
  Filled 2022-05-11: qty 2

## 2022-05-11 MED ORDER — METRONIDAZOLE 500 MG/100ML IV SOLN
500.0000 mg | Freq: Two times a day (BID) | INTRAVENOUS | Status: DC
  Administered 2022-05-12 – 2022-05-15 (×8): 500 mg via INTRAVENOUS
  Filled 2022-05-11 (×8): qty 100

## 2022-05-11 MED ORDER — HYDRALAZINE HCL 25 MG PO TABS
50.0000 mg | ORAL_TABLET | Freq: Three times a day (TID) | ORAL | Status: DC
Start: 2022-05-11 — End: 2022-05-12

## 2022-05-11 MED ORDER — SIMVASTATIN 5 MG PO TABS
5.0000 mg | ORAL_TABLET | Freq: Every day | ORAL | Status: DC
  Administered 2022-05-12 – 2022-05-15 (×4): 5 mg via ORAL
  Filled 2022-05-11 (×4): qty 1

## 2022-05-11 MED ORDER — LITHIUM CARBONATE 150 MG PO CAPS
450.0000 mg | ORAL_CAPSULE | Freq: Every day | ORAL | Status: DC
  Administered 2022-05-12 – 2022-05-15 (×4): 450 mg via ORAL
  Filled 2022-05-11 (×4): qty 3

## 2022-05-11 MED ORDER — IOHEXOL 300 MG/ML  SOLN
80.0000 mL | Freq: Once | INTRAMUSCULAR | Status: AC | PRN
  Administered 2022-05-11: 80 mL via INTRAVENOUS

## 2022-05-11 MED ORDER — ONDANSETRON HCL 4 MG PO TABS: 4.0000 mg | ORAL_TABLET | Freq: Four times a day (QID) | ORAL | Status: DC | PRN

## 2022-05-11 MED ORDER — SODIUM CHLORIDE 0.9 % IV BOLUS (SEPSIS)
1000.0000 mL | Freq: Once | INTRAVENOUS | Status: AC
  Administered 2022-05-11: 1000 mL via INTRAVENOUS

## 2022-05-11 MED ORDER — HYDRALAZINE HCL 20 MG/ML IJ SOLN: 10.0000 mg | Freq: Four times a day (QID) | INTRAMUSCULAR | Status: DC | PRN

## 2022-05-11 MED ORDER — LACTATED RINGERS IV SOLN
INTRAVENOUS | Status: DC
  Administered 2022-05-14: 50 mL/h via INTRAVENOUS

## 2022-05-11 MED ORDER — CEFTRIAXONE SODIUM 1 G IJ SOLR
1.0000 g | INTRAMUSCULAR | Status: DC
  Administered 2022-05-11: 1 g via INTRAVENOUS
  Filled 2022-05-11: qty 10

## 2022-05-11 MED ORDER — PANTOPRAZOLE SODIUM 40 MG PO TBEC
40.0000 mg | DELAYED_RELEASE_TABLET | Freq: Every day | ORAL | Status: DC
  Filled 2022-05-11: qty 1

## 2022-05-11 MED ORDER — ACETAMINOPHEN 650 MG RE SUPP: 650.0000 mg | Freq: Four times a day (QID) | RECTAL | Status: DC | PRN

## 2022-05-11 MED ORDER — ACETAMINOPHEN 500 MG PO TABS: 1000.0000 mg | ORAL_TABLET | Freq: Once | ORAL | Status: DC

## 2022-05-11 MED ORDER — ACETAMINOPHEN 650 MG RE SUPP
650.0000 mg | Freq: Once | RECTAL | Status: AC
  Administered 2022-05-11: 650 mg via RECTAL
  Filled 2022-05-11: qty 1

## 2022-05-11 NOTE — ED Notes (Signed)
Pt having seizure like activity. Christian PA at bedside. Verbal orders placed for 2mg  lorazepam IV at this time.

## 2022-05-11 NOTE — ED Triage Notes (Signed)
Pt BIB [facility] from home, initially called out for weakness x1 week, being unable to stand. Pt has also had periods of shaking but being alert for 3 days, worsening today. On arrival to ED pt was shaking and was aphasic and rigid. 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 Per EMS pt is warm to touch.

## 2022-05-11 NOTE — H&P (Signed)
History and Physical    Patient: Noah Grant MRN: [id number] DOA: 05/11/2022  Date of Service: the patient was seen and examined on 05/12/2022  Patient coming from: Home via EMS  Chief Complaint:  Chief Complaint  Patient presents with   Weakness    HPI:   71 year old male with past medical history of coronary artery disease (S/P cath with DES to right PDA branch 08/2020), hypertension, hyperlipidemia, chronic kidney disease stage IIIa (Baseline Cr 1.3-1.5) and rheumatoid arthritis (currently on leflunomide due to numerous drug intolerances), bipolar 1 disorder, benign prostatic hyperplasia, gastroesophageal reflux disease, generalized anxiety disorder, vitamin B12 deficiency presenting to [facility] via EMS due to complaints of weakness and tremors.  Patient explains that for approximate the past week he has developed increasing weakness.  Initially weakness was mild in intensity but progressively became more more severe.  Particularly in the past 3 days his symptoms have become severe, associated with subjective fevers as well as intermittent complaints of chest discomfort.  In the past 24 hours in particular patient reports episodes of violent shaking.  Patient denies any focal extremity weakness, slurred speech, changes in vision, sick contacts, recent travel, shortness of breath, cough or contact with confirmed COVID-19 infection.  Finally the patient also complains of chest pain.  Chest pain is sharp in quality, located in the left chest and left upper quadrant.  Pain is worse with movement or deep inspiration.  Pain seems to resolve when patient leans forward.  Patient states he has never had chest pain like this before.  Of note, patient has a history of rheumatoid arthritis and is receiving leflunomide therapy.  EMS was eventually contacted who promptly came to evaluate patient and brought him into [facility] for evaluation.  Upon evaluation in the emergency  department patient was found to have extremely high fevers as high as 103.7 F with concurrent leukocytosis of 15 K concerning for sepsis of undetermined source.  Patient was initiated on broad-spectrum intravenous antibiotics including ceftriaxone and vancomycin.  Patient was given 2 L of normal saline.  Hospitalist group was then called to assess the patient for admission the hospital due to concerns for sepsis.  Review of Systems: Review of Systems  Constitutional:  Positive for chills and fever.  Neurological:  Positive for tremors and weakness.  All other systems reviewed and are negative.   Past Medical History:  Diagnosis Date   Abdominal aortic atherosclerosis ([place])    Anemia    likely of chronic disease   Angina pectoris ([place]) 08/30/2020   Arthritis    Barrett's esophagus without dysplasia 03/25/2019   Formatting of this note might be different from the original. EGD done in 02/2019. Due in 3 years.   Bipolar 1 disorder ([place])    Bipolar 1 disorder, mixed, moderate ([place]) 08/16/2014   Bipolar affective disorder, currently depressed, moderate ([place]) 03/08/2020   Bipolar depression ([place]) 08/16/2014   BPH with urinary obstruction 12/29/2018   Brain ischemia 03/08/2020   Cardiac murmur 08/30/2020   Chest pain 09/06/2020   Chicken pox    Chronic bronchitis (HCC)    Cognitive complaints 03/08/2020   Coronary artery disease involving native coronary artery of native heart with angina pectoris ([place]) 09/06/2020   Depression    Disc degeneration, 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   Drug therapy 05/18/2021   Essential hypertension    GAD (generalized anxiety disorder) 11/02/2018   Gastroesophageal reflux disease 08/16/2014   Formatting of this note might be different from the original.  Last Assessment & Plan:  Well controlled. Continue current medications.   Gastroesophageal reflux disease without esophagitis 08/16/2014   Formatting of this note might be different from the original. Last Assessment &  Plan:  Well controlled. Continue current medications.   Herpes gingivostomatitis 02/10/2015   History of colon polyps 03/25/2019   Formatting of this note might be different from the original. tubular adenoma   History of stroke 11/10/2019   Hyperlipidemia    Hypertension    Ingrowing nail 03/12/2020   Labile hypertension 08/16/2014   Formatting of this note might be different from the original. Last Assessment & Plan:  Slightly above goal today secondary to pain. Will continue current regimen. Will check CMP today.   Lichen planus [id number]   Last Assessment & Plan:  Formatting of this note might be different from the original. Concern over possible lichen planus. His dentist noticed lesions of his oral mucosa.  It was felt to be lichen planus.  He presents to discuss further.  Rarely does the area hurt.He smoked in the distant past EXAM shows several white patches on the buccal mucosa bilaterally.  No other concerning oral mucosal les   Long-term use of immunosuppressant medication 03/23/2018   Low testosterone 09/17/2016   Midline thoracic back pain 02/10/2015   Mixed hyperlipidemia 08/16/2014   Normochromic normocytic anemia 08/16/2014   PAD (peripheral artery disease) ([place]) 03/23/2019   Pain of right hip joint 01/19/2016   Rheumatic fever    Rheumatoid arthritis ([place]) 10/29/2018   Formatting of this note might be different from the original. Rheum:  Dr. Gerilyn Nestle Formerly on MTX - stopped due to mouth sores. Currently started on Arava.  Also on 5 mg prednisone x 3 months during run-in.   Right-sided chest wall pain 04/17/2019   Seasonal allergies 03/23/2018   Stage 3a chronic kidney disease ([place]) 07/21/2019   Statin intolerance 07/31/2020   Thoracic radiculopathy 06/16/2019   Tremor 06/16/2019   Vitamin B12 deficiency    Injections   Vitamin D deficiency 09/17/2016    Past Surgical History:  Procedure Laterality Date   CLAVICLE SURGERY     Right, Hardware placed   CORONARY  STENT INTERVENTION N/A 09/06/2020   Procedure: CORONARY STENT INTERVENTION;  Surgeon: Sherren Mocha, MD;  Location: [place] [facility];  Service: Cardiovascular;  Laterality: N/A;   LEFT HEART CATH AND CORONARY ANGIOGRAPHY N/A 09/06/2020   Procedure: LEFT HEART CATH AND CORONARY ANGIOGRAPHY;  Surgeon: Sherren Mocha, MD;  Location: [place] [facility];  Service: Cardiovascular;  Laterality: N/A;   Noah TOOTH EXTRACTION      Social History:  reports that he quit smoking about 49 years ago. His smoking use included cigarettes. He has a 20.00 pack-year smoking history. He has never used smokeless tobacco. He reports that he does not currently use alcohol. He reports that he does not use drugs.  Allergies  Allergen Reactions   Hydroxychloroquine Other (See Comments)    Terrible Nightmares   Methotrexate Dermatitis    Developed Mouth Sores   Nsaids Dermatitis    Developed Mouth Blisters   Isosorbide Other (See Comments)    Made him feel weak   Lovastatin Other (See Comments)    Caused abdominal pain that radiated to the back   Pravastatin Other (See Comments)    Caused abdominal pain that radiated to the back   Rosuvastatin Other (See Comments)    Caused abdominal pain that radiated to the back   Sulfasalazine Nausea Only  Family History  Problem Relation Age of Onset   Alzheimer's disease Mother 74       Deceased in early 60s   Stroke Father 59       Deceased   Hypertension Father    Alcoholism Father    Alcoholism Brother    Heart disease Maternal Grandfather 36       Deceased   Heart disease Paternal Grandfather 67       Deceased   Hypertension Son     Prior to Admission medications   Medication Sig Start Date End Date Taking? Authorizing Provider  amLODipine (NORVASC) 10 MG tablet Take 1 tablet (10 mg total) by mouth daily. 01/02/22   [name], DO  Cholecalciferol 50 MCG (2000 UT) CAPS Take 2,000 Units by mouth daily.     [provider]   clopidogrel (PLAVIX) 75 MG tablet TAKE ONE TABLET BY MOUTH DAILY 12/17/21   Revankar, Reita Cliche, MD  Coenzyme Q10 (COQ10) 200 MG CAPS Take 200 mg by mouth daily.    [provider]  ezetimibe (ZETIA) 10 MG tablet TAKE ONE TABLET BY MOUTH DAILY 10/17/21   Revankar, Reita Cliche, MD  hydrALAZINE (APRESOLINE) 50 MG tablet Take 50 mg by mouth 3 (three) times daily.    [provider]  labetalol (NORMODYNE) 100 MG tablet Take 1 tablet (100 mg total) by mouth 2 (two) times daily. 01/02/22   [name], DO  leflunomide (ARAVA) 20 MG tablet Take 20 mg by mouth every Monday, Tuesday, Wednesday, Thursday, and Friday.    [provider]  lithium carbonate 150 MG capsule TAKE THREE CAPSULES BY MOUTH DAILY 04/08/22   Cottle, Billey Co., MD  losartan (COZAAR) 100 MG tablet Take 100 mg by mouth daily.    [provider]  Lysine HCl 1000 MG TABS Take 1,000 mg by mouth 2 (two) times daily.     [provider]  nitroGLYCERIN (NITROSTAT) 0.4 MG SL tablet Place 1 tablet (0.4 mg total) under the tongue every 5 (five) minutes as needed for chest pain. 01/22/22   Revankar, Reita Cliche, MD  pantoprazole (PROTONIX) 40 MG tablet Take 1 tablet (40 mg total) by mouth daily. 01/02/22   [name], DO  predniSONE (DELTASONE) 5 MG tablet Take 1 tablet by mouth 2 (two) times daily. Patient not taking: Reported on 04/19/2022 09/11/21   [provider]  simvastatin (ZOCOR) 5 MG tablet Take 1 tablet (5 mg total) by mouth daily. 01/02/22   [name], DO    Physical Exam:  Vitals:   05/12/22 0800 05/12/22 0818 05/12/22 0830 05/12/22 1006  BP: 118/67   136/73  Pulse: 66  68   Resp: (!) 23  (!) 35   Temp:  97.7 F (36.5 C)    TempSrc:  Oral    SpO2: 98%  96%   Weight:      Height:        Constitutional: Lethargic but arousable and oriented x3, no associated distress.   Skin: no rashes, no lesions, poor skin turgor noted. Eyes: Pupils are equally  reactive to light.  No evidence of scleral icterus or conjunctival pallor.  ENMT: dry mucous membranes noted.  Posterior pharynx clear of any exudate or lesions.   Neck: normal, supple, no masses, no thyromegaly.  No evidence of jugular venous distension.   Respiratory: clear to auscultation bilaterally, no wheezing, no crackles. Normal respiratory effort. No accessory muscle use.  Cardiovascular: Tachycardic rate with  irregularly irregular rhythm, pericardial friction rub heard, No extremity edema. 2+ pedal pulses. No carotid bruits.  Chest:   Notable left chest wall tenderness without without crepitus or deformity.   Back:   Nontender without crepitus or deformity. Abdomen: Left upper quadrant abdominal tenderness that is soft.  No evidence of intra-abdominal masses.  Positive bowel sounds noted in all quadrants.   Musculoskeletal: No joint deformity upper and lower extremities. Good ROM, no contractures. Normal muscle tone.  Neurologic: CN 2-12 grossly intact. Sensation intact.  Patient moving all 4 extremities spontaneously.  Patient is following all commands.  Patient is responsive to verbal stimuli.   Psychiatric: Patient is visibly anxious with labile affect.  Patient does seem to possess insight as to his current situation.    Data Reviewed:  I have personally reviewed and interpreted labs, imaging.  Significant findings are:  CBC revealing white blood cell count 15, hemoglobin 10.4, hematocrit 32.3, platelet count of 270. Chemistry revealing sodium 132, bicarbonate 12, BUN 31, creatinine 1.67.   Lactic acid 2.4. Elevated troponins found to be 332 followed by 441. COVID-19 PCR testing negative Chest x-ray personally reviewed revealing no evidence of cardiopulmonary disease.  EKG: Personally reviewed.  Rhythm is normal sinus rhythm with heart rate of 96 bpm.  Evidence of right bundle branch block obscuring assessment of ST segments.     Assessment and Plan: * Sepsis [facility]) Patient  presenting with multiple SIRS criteria including tachycardia, extremely high fevers and leukocytosis  Patient additionally exhibiting evidence of organ dysfunction with markedly elevated troponins and lactic acidosis A thorough work-up has been undertaken which has not been able to identify a definitive source of infection That being said, patient's presentation of severe chest pain that improves with leaning forward, a pericardial friction rub on examination, elevated troponin and small pericardial effusion on CT imaging are all concerning for pericarditis Typically pericarditis would be inflammatory in this patient with rheumatoid arthritis however considering patient is extremely immunocompromised on leflunomide and infectious cause of the pericarditis must be considered Keeping patient on broad-spectrum antibiotics for now Intravenous volume resuscitation with isotonic fluids. Blood cultures have been obtained Awaiting consultation by cardiology, I have discussed the case with them multiple times and while they agree the pericarditis possibility is atypical it is the most likely diagnosis at this point. We will additionally obtain infectious disease consultation in the morning Finally, we will treat pericarditis with an evidence-based regimen of 0.5 mg/kg of prednisone daily in addition to colchicine 0.5 mg twice daily unfortunately NSAIDs are not an option in this patient with renal disease. Close, for monitoring in the progressive unit  Pericarditis Please see assessment and plan above.  Lactic acidosis Lactic acidosis likely secondary to sepsis or volume depletion Hydrate patient intravenous isotonic fluids while concurrently treating underlying infection Monitoring serial lactic acid levels to ensure downtrending and resolution.   Elevated troponin level not due myocardial infarction Markedly elevated troponin Most likely causes for elevated troponin are either possible pericarditis  or possibly simply demand ischemia due to underlying infectious/inflammatory process. Plaque rupture unlikely Obtaining echocardiogram in the morning Monitoring patient on telemetry Remainder of assessment and plan as above  Atrial fibrillation with rapid ventricular response [facility]) Patient developed rapid atrial fibrillation shortly after arrival to the emergency department. This further brings about concern for a possible underlying pericarditis Managing rate control with scheduled low-dose intravenous metoprolol Holding off on anticoagulation for now Monitoring patient on telemetry Echocardiogram in the morning Monitoring electrolytes closely  Rheumatoid arthritis (  [place]) Longstanding history of rheumatoid arthritis on regular leflunomide therapy has placed the patient in an extremely immunocompromised state. Holding leflunomide for now as we manage a potentially infectious process Patient will require continued outpatient rheumatology follow-up  Coronary artery disease involving native coronary artery of native heart Continuing home regimen of statin and beta-blocker therapy Monitoring on telemetry Please see comments on elevated troponin above   Chronic kidney disease, stage 3a (HCC) Presence of chronic kidney disease limits NSAID use in the setting of possible pericarditis Strict intake and output monitoring Creatinine near baseline Minimizing nephrotoxic agents as much as possible Serial chemistries to monitor renal function and electrolytes   Essential hypertension Resuming home regimen of Norvasc as blood pressure tolerates. Temporarily using intravenous metoprolol instead of home regimen of labetalol in the setting of rapid atrial fibrillation As needed intravenous antihypertensives for markedly elevated blood pressure.  Mixed hyperlipidemia Continuing home regimen of lipid lowering therapy.   Bipolar 1 disorder, mixed, moderate (HCC) Continue home regimen of  lithium Obtain lithium level with morning labs  GERD without esophagitis Continuing home regimen of daily PPI therapy.        Code Status:  Full code  code status decision has been confirmed with: patient Family Communication: deferred  Consults: Cardiology  Severity of Illness:  The appropriate patient status for this patient is INPATIENT. Inpatient status is judged to be reasonable and necessary in order to provide the required intensity of service to ensure the patient's safety. The patient's presenting symptoms, physical exam findings, and initial radiographic and laboratory data in the context of their chronic comorbidities is felt to place them at high risk for further clinical deterioration. Furthermore, it is not anticipated that the patient will be medically stable for discharge from the hospital within 2 midnights of admission.   * I certify that at the point of admission it is my clinical judgment that the patient will require inpatient hospital care spanning beyond 2 midnights from the point of admission due to high intensity of service, high risk for further deterioration and high frequency of surveillance required.*  Author:  Vernelle Emerald MD  05/12/2022 10:18 AM

## 2022-05-11 NOTE — Progress Notes (Signed)
Pharmacy Antibiotic Note  Noah PerchesRonald R Rod is a 71 y.o. male for which pharmacy has been consulted for vancomycin dosing for sepsis.  Patient with a history of bipolar disorder, HLD, anxiety, stroke, RA, tremor. Patient presenting with weakness, difficulty standing, worsening tremors for the last 3 days.  SCr 1.7 - above baseline WBC 15; LA 1; T 102.7 F; HR 94; RR 18 COVID/flu - neg  Plan: Ceftriaxone per MD Vancomycin 1500 mg once, subsequent dosing as indicated per random vancomycin level until renal function stable and/or improved, at which time scheduled dosing can be considered Trend WBC, Fever, Renal function, & Clinical course F/u cultures, clinical course, WBC, fever De-escalate when able     Temp (24hrs), Avg:103.2 F (39.6 C), Min:102.7 F (39.3 C), Max:103.6 F (39.8 C)  Recent Labs  Lab 05/11/22 1711 05/11/22 1743  WBC 15.0*  --   CREATININE 1.67* 1.70*  LATICACIDVEN 2.4*  --     CrCl cannot be calculated (Unknown ideal weight.).    Allergies  Allergen Reactions   Hydroxychloroquine Other (See Comments)    Terrible Nightmares   Methotrexate Dermatitis    Developed Mouth Sores   Nsaids Dermatitis    Developed Mouth Blisters   Isosorbide Other (See Comments)    Made him feel weak   Lovastatin Other (See Comments)    Caused abdominal pain that radiated to the back   Pravastatin Other (See Comments)    Caused abdominal pain that radiated to the back   Rosuvastatin Other (See Comments)    Caused abdominal pain that radiated to the back   Sulfasalazine Nausea Only    Antimicrobials this admission: ceftriaxone 6/3 >>  vancomycin 6/3 >>  Microbiology results: Pending  Thank you for allowing pharmacy to be a part of this patient's care.  Delmar LandauJonathan Conner Muegge, PharmD, BCPS 05/11/2022 7:40 PM ED Clinical Pharmacist -  [phone]

## 2022-05-11 NOTE — ED Provider Notes (Signed)
[facility] DEPARTMENT Provider Note   CSN: [id number] Arrival date & time: 05/11/22  1701     History  Chief Complaint  Patient presents with   Weakness    Noah Grant is a 71 y.o. male with extensive past medical history including bipolar disorder, hyperlipidemia, anxiety, previous stroke, rheumatoid arthritis, history of tremor who presents with weakness, difficulty standing, worsening tremors for the last 3 days, feeling hot to the touch, he also endorsed some left-sided chest chest pain.  He has a history of stent due to total coronary artery disease blockage.  No diagnosed history of seizures.  He has a few high risk medications including Plavix, lithium, he is on an immunosuppressive drug for his rheumatoid arthritis called leflunomide.  Patient arrives via EMS actively shaking, with rigors of all 4 extremities, however he is conscious, following commands.  He initially has difficulty with speech, and some dysarthria.  He does not seem to have any unilateral deficits on their evaluation, and no evidence of EKG changes or ST elevations prior to arrival.   Weakness Associated symptoms: chest pain and fever       Home Medications Prior to Admission medications   Medication Sig Start Date End Date Taking? Authorizing Provider  amLODipine (NORVASC) 10 MG tablet Take 1 tablet (10 mg total) by mouth daily. 01/02/22   [name], DO  Cholecalciferol 50 MCG (2000 UT) CAPS Take 2,000 Units by mouth daily.     [provider]  clopidogrel (PLAVIX) 75 MG tablet TAKE ONE TABLET BY MOUTH DAILY 12/17/21   Revankar, Reita Cliche, MD  Coenzyme Q10 (COQ10) 200 MG CAPS Take 200 mg by mouth daily.    [provider]  ezetimibe (ZETIA) 10 MG tablet TAKE ONE TABLET BY MOUTH DAILY 10/17/21   Revankar, Reita Cliche, MD  hydrALAZINE (APRESOLINE) 50 MG tablet Take 50 mg by mouth 3 (three) times daily.    [provider]  labetalol (NORMODYNE) 100  MG tablet Take 1 tablet (100 mg total) by mouth 2 (two) times daily. 01/02/22   [name], DO  leflunomide (ARAVA) 20 MG tablet Take 20 mg by mouth every Monday, Tuesday, Wednesday, Thursday, and Friday.    [provider]  lithium carbonate 150 MG capsule TAKE THREE CAPSULES BY MOUTH DAILY 04/08/22   Cottle, Billey Co., MD  losartan (COZAAR) 100 MG tablet Take 100 mg by mouth daily.    [provider]  Lysine HCl 1000 MG TABS Take 1,000 mg by mouth 2 (two) times daily.     [provider]  nitroGLYCERIN (NITROSTAT) 0.4 MG SL tablet Place 1 tablet (0.4 mg total) under the tongue every 5 (five) minutes as needed for chest pain. 01/22/22   Revankar, Reita Cliche, MD  pantoprazole (PROTONIX) 40 MG tablet Take 1 tablet (40 mg total) by mouth daily. 01/02/22   [name], DO  predniSONE (DELTASONE) 5 MG tablet Take 1 tablet by mouth 2 (two) times daily. Patient not taking: Reported on 04/19/2022 09/11/21   [provider]  simvastatin (ZOCOR) 5 MG tablet Take 1 tablet (5 mg total) by mouth daily. 01/02/22   [name], DO      Allergies    Hydroxychloroquine, Methotrexate, Nsaids, Isosorbide, Lovastatin, Pravastatin, Rosuvastatin, and Sulfasalazine    Review of Systems   Review of Systems  Constitutional:  Positive for chills and fever.  Cardiovascular:  Positive for chest pain.  Neurological:  Positive for weakness.  All other systems reviewed and are negative.  Physical Exam Updated Vital Signs BP (!) 117/58   Pulse 85   Temp (!) 102.2 F (39 C) (Rectal)   Resp (!) 24   SpO2 93%  Physical Exam Vitals and nursing note reviewed.  Constitutional:      General: He is in acute distress.     Appearance: Normal appearance. He is ill-appearing.     Comments: Patient with full body tremors versus rigors on arrival that resolved spontaneously after Ativan.  He was conscious for the entire rigors presentation  HENT:     Head:  Normocephalic and atraumatic.  Eyes:     General:        Right eye: No discharge.        Left eye: No discharge.     Comments: Pupils equal round reactive to light bilaterally.  No photophobia noted.  Pupils symmetric.  Neck:     Comments: No nuchal rigidity noted Cardiovascular:     Rate and Rhythm: Normal rate and regular rhythm.     Heart sounds: No murmur heard.   No friction rub. No gallop.     Comments: No appreciable murmur Pulmonary:     Effort: Pulmonary effort is normal.     Breath sounds: Normal breath sounds.     Comments: Some tachypnea noted. Abdominal:     General: Bowel sounds are normal.     Palpations: Abdomen is soft.     Comments: Patient does endorse some generalized tenderness to palpation throughout the entirety of the abdomen.  No focal localization in any quadrant.  No rebound, rigidity, guarding.  Normal bowel sounds throughout.  Musculoskeletal:     Comments: Intact strength 5 out of 5 bilateral upper and lower extremities, patient does seem to have some kind of an intention tremor with his tremor/regular activity seeming to worsen with an eye ask him to move.  Skin:    General: Skin is warm and dry.     Capillary Refill: Capillary refill takes less than 2 seconds.     Comments: Significantly warm, minimally diaphoretic skin.  Warm to the touch.  No evidence of ulcers, redness of joints, joint swelling.  Neurological:     Mental Status: He is alert and oriented to person, place, and time.  Psychiatric:        Mood and Affect: Mood normal.        Behavior: Behavior normal.    ED Results / Procedures / Treatments   Labs (all labs ordered are listed, but only abnormal results are displayed) Labs Reviewed  CBC - Abnormal; Notable for the following components:      Result Value   WBC 15.0 (*)    RBC 3.45 (*)    Hemoglobin 10.4 (*)    HCT 32.3 (*)    All other components within normal limits  COMPREHENSIVE METABOLIC PANEL - Abnormal; Notable for the  following components:   Sodium 132 (*)    CO2 19 (*)    Glucose, Bld 134 (*)    BUN 31 (*)    Creatinine, Ser 1.67 (*)    Calcium 8.7 (*)    Albumin 3.4 (*)    GFR, Estimated 44 (*)    All other components within normal limits  LACTIC ACID, PLASMA - Abnormal; Notable for the following components:   Lactic Acid, Venous 2.4 (*)    All other components within normal limits  PROTIME-INR - Abnormal; Notable for the following components:  Prothrombin Time 15.6 (*)    INR 1.3 (*)    All other components within normal limits  APTT - Abnormal; Notable for the following components:   aPTT 50 (*)    All other components within normal limits  URINALYSIS, ROUTINE W REFLEX MICROSCOPIC - Abnormal; Notable for the following components:   APPearance HAZY (*)    Protein, ur 30 (*)    Bacteria, UA RARE (*)    All other components within normal limits  I-STAT CHEM 8, ED - Abnormal; Notable for the following components:   Sodium 134 (*)    BUN 32 (*)    Creatinine, Ser 1.70 (*)    Glucose, Bld 130 (*)    Calcium, Ion 1.10 (*)    Hemoglobin 10.9 (*)    HCT 32.0 (*)    All other components within normal limits  TROPONIN I (HIGH SENSITIVITY) - Abnormal; Notable for the following components:   Troponin I (High Sensitivity) 332 (*)    All other components within normal limits  TROPONIN I (HIGH SENSITIVITY) - Abnormal; Notable for the following components:   Troponin I (High Sensitivity) 441 (*)    All other components within normal limits  RESP PANEL BY RT-PCR (FLU A&B, COVID) ARPGX2  CULTURE, BLOOD (ROUTINE X 2)  CULTURE, BLOOD (ROUTINE X 2)  URINE CULTURE  LACTIC ACID, PLASMA  CK  MAGNESIUM  SEDIMENTATION RATE  HIV ANTIBODY (ROUTINE TESTING W REFLEX)  COMPREHENSIVE METABOLIC PANEL  MAGNESIUM  CBC WITH DIFFERENTIAL/PLATELET  LITHIUM LEVEL    EKG EKG Interpretation  Date/Time:  Saturday May 11 2022 17:12:23 EDT Ventricular Rate:  96 PR Interval:  149 QRS Duration: 132 QT  Interval:  346 QTC Calculation: 438 R Axis:   104 Text Interpretation: Sinus rhythm RBBB and LPFB Inferior infarct, acute  No significant change from prior ecg Jan 2023 Confirmed by Octaviano Glow [phone]) on 05/11/2022 6:26:28 PM  Radiology CT Head Wo Contrast  Addendum Date: 05/11/2022   ADDENDUM REPORT: 05/11/2022 22:14 ADDENDUM: Interval increase in size compared to MRI head 06/30/2019 right scalp 14 x 10 mm cm subcutaneus soft tissue lesion with associated calcifications (new from 2012). Recommend correlation with physical exam. These results were called by telephone at the time of interpretation on 05/11/2022 at 10:14 pm to provider [facility] , who verbally acknowledged these results. Electronically Signed   By: Iven Finn M.D.   On: 05/11/2022 22:14   Result Date: 05/11/2022 CLINICAL DATA:  Neuro deficit, acute, stroke suspected EXAM: CT HEAD WITHOUT CONTRAST TECHNIQUE: Contiguous axial images were obtained from the base of the skull through the vertex without intravenous contrast. RADIATION DOSE REDUCTION: This exam was performed according to the departmental dose-optimization program which includes automated exposure control, adjustment of the mA and/or kV according to patient size and/or use of iterative reconstruction technique. COMPARISON:  MRI head 06/30/2019 BRAIN: BRAIN Cerebral ventricle sizes are concordant with the degree of cerebral volume loss. Patchy and confluent areas of decreased attenuation are noted throughout the deep and periventricular white matter of the cerebral hemispheres bilaterally, compatible with chronic microvascular ischemic disease. Chronic left centrum semiovale lacunar infarction. Chronic right basal ganglia lacunar infarction. No evidence of large-territorial acute infarction. No parenchymal hemorrhage. No mass lesion. No extra-axial collection. No mass effect or midline shift. No hydrocephalus. Basilar cisterns are patent. Vascular: No hyperdense vessel.  Atherosclerotic calcifications are present within the cavernous internal carotid and vertebral arteries. Skull: No acute fracture or focal lesion. Sinuses/Orbits: Paranasal sinuses and mastoid  air cells are clear. The orbits are unremarkable. Other: None. IMPRESSION: No acute intracranial abnormality. Electronically Signed: By: Iven Finn M.D. On: 05/11/2022 18:47   CT Angio Chest PE W and/or Wo Contrast  Result Date: 05/11/2022 CLINICAL DATA:  Pulmonary embolism (PE) suspected, high prob. Generalized weakness. EXAM: CT ANGIOGRAPHY CHEST WITH CONTRAST TECHNIQUE: Multidetector CT imaging of the chest was performed using the standard protocol during bolus administration of intravenous contrast. Multiplanar CT image reconstructions and MIPs were obtained to evaluate the vascular anatomy. RADIATION DOSE REDUCTION: This exam was performed according to the departmental dose-optimization program which includes automated exposure control, adjustment of the mA and/or kV according to patient size and/or use of iterative reconstruction technique. CONTRAST:  74mL OMNIPAQUE IOHEXOL 350 MG/ML SOLN COMPARISON:  CT chest 12/21/2018 FINDINGS: Cardiovascular: Satisfactory opacification of the pulmonary arteries to the segmental level. No evidence of pulmonary embolism. Slightly limited evaluation of the subsegmental level due to motion artifact and atelectasis. The main pulmonary artery is normal in caliber. Prominent right ventricle. Trace pericardial effusion. The thoracic aorta is normal in caliber. Moderate atherosclerotic plaque of the thoracic aorta. At least 2 vessel coronary artery calcifications. Mediastinum/Nodes: No enlarged mediastinal, hilar, or axillary lymph nodes. Thyroid gland, trachea, and esophagus demonstrate no significant findings. Lungs/Pleura: No focal consolidation. No pulmonary nodule. No pulmonary mass. Trace bilateral, left greater than right, pleural effusions. Associated bilateral lower lobe  passive atelectasis. No pneumothorax. Upper Abdomen: No acute abnormality.  Splenule noted. Musculoskeletal: No chest wall abnormality. No suspicious lytic or blastic osseous lesions. No acute displaced fracture. Old healed right rib fractures. Plate and screw fixation of an old healed right clavicular fracture. Review of the MIP images confirms the above findings. IMPRESSION: 1. No pulmonary embolus with slightly limited evaluation of the subsegmental level due to motion artifact and atelectasis. 2. Trace pericardial effusion and trace bilateral pleural effusions, left greater than right. 3. Prominent right cardiac ventricle. 4. Aortic Atherosclerosis (ICD10-I70.0) with at least 2 vessel coronary calcification. Electronically Signed   By: Iven Finn M.D.   On: 05/11/2022 23:10   CT ABDOMEN PELVIS W CONTRAST  Result Date: 05/11/2022 CLINICAL DATA:  Abdominal pain, acute, nonlocalized EXAM: CT ABDOMEN AND PELVIS WITH CONTRAST TECHNIQUE: Multidetector CT imaging of the abdomen and pelvis was performed using the standard protocol following bolus administration of intravenous contrast. RADIATION DOSE REDUCTION: This exam was performed according to the departmental dose-optimization program which includes automated exposure control, adjustment of the mA and/or kV according to patient size and/or use of iterative reconstruction technique. CONTRAST:  72mL OMNIPAQUE IOHEXOL 300 MG/ML  SOLN COMPARISON:  None Available. FINDINGS: Lower chest: Elevated left hemidiaphragm. Posterior right lower lobe dependent passive atelectasis. Coronary calcifications. Hepatobiliary: No focal liver abnormality. Contracted gallbladder. No gallstones, gallbladder wall thickening, or pericholecystic fluid. No biliary dilatation. Pancreas: No focal lesion. Normal pancreatic contour. No surrounding inflammatory changes. No main pancreatic ductal dilatation. Spleen: Normal in size without focal abnormality.  Splenule noted. Adrenals/Urinary  Tract: No adrenal nodule bilaterally. Bilateral kidneys enhance symmetrically. No hydronephrosis. No hydroureter. The urinary bladder is unremarkable. Stomach/Bowel: Stomach is within normal limits. No evidence of bowel wall thickening or dilatation. Scattered colonic diverticulosis. The appendix is not definitely identified with no inflammatory changes in the right lower quadrant to suggest acute appendicitis. Vascular/Lymphatic: No abdominal aorta or iliac aneurysm. Severe atherosclerotic plaque of the aorta and its branches. No abdominal, pelvic, or inguinal lymphadenopathy. Reproductive: The prostate measures mildly enlarged (5 cm). Other: No intraperitoneal free fluid. No  intraperitoneal free gas. No organized fluid collection. Musculoskeletal: No abdominal wall hernia or abnormality. No suspicious lytic or blastic osseous lesions. No acute displaced fracture. IMPRESSION: 1. No acute intra-abdominal or intrapelvic abnormality. 2. Scattered colonic diverticulosis with no acute diverticulitis. 3. Prostatomegaly. 4. Aortic Atherosclerosis (ICD10-I70.0) - severe. Electronically Signed   By: Iven Finn M.D.   On: 05/11/2022 18:58   DG Chest Port 1 View  Result Date: 05/11/2022 CLINICAL DATA:  Weakness for a week. EXAM: PORTABLE CHEST 1 VIEW COMPARISON:  May 14, 2021 FINDINGS: Stable cardiomegaly. The hila and mediastinum are unremarkable. No pneumothorax. No nodules or masses. No focal infiltrates. Healed right rib fractures. Repair of right clavicular fracture. IMPRESSION: No active disease. Electronically Signed   By: Dorise Bullion III M.D.   On: 05/11/2022 18:03    Procedures .Critical Care Performed by: Anselmo Pickler, PA-C Authorized by: Anselmo Pickler, PA-C   Critical care provider statement:    Critical care time (minutes):  43   Critical care was necessary to treat or prevent imminent or life-threatening deterioration of the following conditions:  Sepsis   Critical care was  time spent personally by me on the following activities:  Development of treatment plan with patient or surrogate, discussions with consultants, evaluation of patient's response to treatment, examination of patient, ordering and review of laboratory studies, ordering and review of radiographic studies, ordering and performing treatments and interventions, pulse oximetry, re-evaluation of patient's condition and review of old charts   Care discussed with: admitting provider      Medications Ordered in ED Medications  vancomycin variable dose per unstable renal function (pharmacist dosing) (has no administration in time range)  amLODipine (NORVASC) tablet 10 mg (has no administration in time range)  clopidogrel (PLAVIX) tablet 75 mg (has no administration in time range)  lithium carbonate capsule 450 mg (has no administration in time range)  losartan (COZAAR) tablet 100 mg (has no administration in time range)  simvastatin (ZOCOR) tablet 5 mg (has no administration in time range)  pantoprazole (PROTONIX) EC tablet 40 mg (has no administration in time range)  nitroGLYCERIN (NITROSTAT) SL tablet 0.4 mg (has no administration in time range)  enoxaparin (LOVENOX) injection 40 mg (has no administration in time range)  acetaminophen (TYLENOL) tablet 650 mg (has no administration in time range)    Or  acetaminophen (TYLENOL) suppository 650 mg (has no administration in time range)  polyethylene glycol (MIRALAX / GLYCOLAX) packet 17 g (has no administration in time range)  ondansetron (ZOFRAN) tablet 4 mg (has no administration in time range)    Or  ondansetron (ZOFRAN) injection 4 mg (has no administration in time range)  lactated ringers infusion ( Intravenous New Bag/Given 05/11/22 2355)  metroNIDAZOLE (FLAGYL) IVPB 500 mg (has no administration in time range)  hydrALAZINE (APRESOLINE) injection 10 mg (has no administration in time range)  ceFEPIme (MAXIPIME) 2 g in sodium chloride 0.9 % 100 mL IVPB  (2 g Intravenous New Bag/Given 05/12/22 0002)  0.9 %  sodium chloride infusion ( Intravenous New Bag/Given 05/12/22 0000)  metoprolol tartrate (LOPRESSOR) injection 2.5 mg (has no administration in time range)  LORazepam (ATIVAN) injection 2 mg (2 mg Intravenous Given 05/11/22 1719)  sodium chloride 0.9 % bolus 1,000 mL (0 mLs Intravenous Stopped 05/11/22 1941)  acetaminophen (TYLENOL) suppository 650 mg (650 mg Rectal Given 05/11/22 1737)  sodium chloride 0.9 % bolus 1,000 mL (0 mLs Intravenous Stopped 05/11/22 1904)  iohexol (OMNIPAQUE) 300 MG/ML solution 80 mL (80 mLs  Intravenous Contrast Given 05/11/22 1835)  vancomycin (VANCOREADY) IVPB 1500 mg/300 mL (0 mg Intravenous Stopped 05/11/22 2255)  acetaminophen (TYLENOL) tablet 1,000 mg (1,000 mg Oral Given 05/11/22 2225)  iohexol (OMNIPAQUE) 350 MG/ML injection 50 mL (50 mLs Intravenous Contrast Given 05/11/22 2249)    ED Course/ Medical Decision Making/ A&P Clinical Course as of 05/12/22 0017  Sat May 11, 2022  1820 Patient had endorsed some abnormal smell of urine to my physician.  We will start on Rocephin. [CP]  [id number] This is a 63-year-old male presenting from home with 1 week of generalized shaking chills, weakness, fatigue.  His wife by supplemental history.  They deny that he has any coughing or shortness of breath.  He does have rheumatoid arthritis reports that all of his joints have been aching.  He reported that his urine smelled "bad like ham" earlier this week.  He denies history of UTIs.  He denies sick contacts in the house.  On arrival he is febrile, heart rate around 90, blood pressure has been mildly hypertensive, no hypoxia, no respiratory distress or rhonchi on pulmonary exam, no significant abdominal tenderness or GI complaints per the patient's report.  Labs show leukocytosis white count of 15,000.  X-ray of the chest personally reviewed showing no focal infiltrate.  We are awaiting a UA, but I I suspect this would be the likely source.  If this is  negative I have also ordered a CT of the abdomen to evaluate for intra-abdominal source of infection.  The patient does meet sepsis criteria and blood cultures have been drawn, with his rigors there is some possible concern for sepsis.  We will reassess during his fluid bolus, Rocephin ordered. 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  I suspect he may have an NSTEMI from demand ischemia.  Less likely from acute PE or ACS.  I would hold off on heparin at this time until we have completed his infectious work-up. [MT]  2120 Paged cards, paged admission Still no clear infectious source [CP]    Clinical Course User Index [CP] Elisama Thissen, Joesph Fillers, PA-C [MT] Langston Masker Carola Rhine, MD                           Medical Decision Making Amount and/or Complexity of Data Reviewed Labs: ordered. Decision-making details documented in ED Course. Radiology: ordered.  Risk OTC drugs. Prescription drug management. Decision regarding hospitalization.   This patient is a 71 y.o. male who presents to the ED for concern of undifferentiated sepsis presentation, chest pain, fever, weakness, this involves an extensive number of treatment options, and is a complaint that carries with it a high risk of complications and morbidity. The emergent differential diagnosis prior to evaluation includes, but is not limited to; patient's undifferentiated presentation on arrival gives quite a broad differential.  Considered focal seizures with retained consciousness, meningitis, encephalitis, pericarditis, endocarditis, ACS, AAS, intra-abdominal infection, urinary infection, pneumonia, atypical PE presentation versus other  This is not an exhaustive differential.   Past  Medical History / Co-morbidities / Social History: History of bipolar disorder, hyperlipidemia, CKD, rheumatoid arthritis, hypertension, acid reflux, chronic bronchitis  Additional history: Chart reviewed. Pertinent results include: Outpatient family medicine, rheumatology  visits, previous lab work and imaging from emergency department visit  Physical Exam: Physical exam performed. The pertinent findings include: Patient with no neurologic deficits on my exam, however he does have some questionable altered mental status versus dysarthria versus focal seizure presentation without postictal state, loss of consciousness.  He shows no signs of skin changes to suggest cellulitis, ulcer, septic arthritis as source of his infection.  He does have some tenderness palpation of the abdomen, question intra-abdominal complications.  He has no nuchal rigidity, no photosensitivity.  Clinically does not seem consistent with meningitis, encephalitis.  He is got normal heart and lung sounds on my exam.  He does endorse some significant chest pain throughout the entirety of his presentation  Lab Tests: I ordered, and personally interpreted labs.  The pertinent results include:   Significantly elevated troponin, initial troponin at 332, delta troponin 441.  Based on the extent of patient's fever and his presentation low clinical suspicion for acute ACS as the cause for patient's septic presentation today, question endocarditis, pericarditis, myocarditis, versus demand ischemia related to his sepsis presentation.  Cannot definitively rule out possible ACS presentation although I do not think that it would be causative for patient's fever.  His urinalysis shows no evidence of urinary tract infection.  Patient with elevation of his creatinine at 1.67, he has some chronic kidney disease, this does represent some kidney injury from baseline but no clinical AKI with elevation less than 0.3 from baseline.  He has a mild  hyponatremia with sodium 132.  CBC does suggest that there is an infection with leukocytosis of 15, patient with mild anemia hemoglobin 10.4.  Initial lactic acid elevated at 2.4.  RVP is negative.  Elevation of PT/INR/APTT.  CK unremarkable.  Repeat lactic acid improved after fluid bolus.  Magnesium unremarkable.    Imaging Studies: I ordered imaging studies including CT PE study, CT head, CT abdomen pelvis, and plain film chest. I independently visualized and interpreted imaging which showed no evidence of acute PE, there is a small pericardial effusion as well as small pleural effusions.  No evidence of intra abdominal abnormality to explain patient's findings, no evidence of intracranial abnormality, possible sebaceous cyst noted.  No evidence of pneumonia. I agree with the radiologist interpretation.   Cardiac Monitoring:  The patient was maintained on a cardiac monitor.  My attending physician Dr. 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 I agree with this interpretation.   Medications: I ordered medication including broad-spectrum antibiotics, aggressive fluid resuscitation, and antipyretics for fever, undifferentiated sepsis presentation, hyponatremia, elevated creatinine.  Gave 2 mg of Ativan on arrival to treat for possible for focal seizure presentation.  Patient with mild improvement of his pyrexia, however he remains febrile, his tremors have significantly improved.  He continues to have some chest pain.  Patient will need further evaluation and work-up, on admission.  Consultations Obtained: I requested consultation with the cardiologist, spoke with Dr. Renella Cunas who agrees to consult on  patient, discussed that he does not believe that this is representative of an acute primary occlusive condition, we did not discuss the possibility of pericarditis, endocarditis,, but difficult to ascertain for these possibilities on initial presentation due to bundle branch blocks hiding any possible  subtle diffuse ST elevations, electrical alternans.  Spoke with Dr. Cyd Silence on the admitting team who agrees to admission after we discussed lab and imaging findings as well as pertinent plan  I discussed this case with my attending physician Dr. Langston Masker who cosigned this note including patient's presenting symptoms, physical exam, and planned diagnostics and interventions. Attending physician stated agreement with plan or made changes to plan which were implemented.    My attending physician assessed this patient at bedside. Final Clinical Impression(s) / ED Diagnoses Final diagnoses:  None    Rx / DC Orders ED Discharge Orders     None         Anselmo Pickler, PA-C 05/12/22 0017    Wyvonnia Dusky, MD 05/12/22 [phone]

## 2022-05-11 NOTE — Sepsis Progress Note (Signed)
Elink following Code Sepsis. 

## 2022-05-12 ENCOUNTER — Inpatient Hospital Stay (HOSPITAL_COMMUNITY): Payer: PPO

## 2022-05-12 DIAGNOSIS — N1831 Chronic kidney disease, stage 3a: Secondary | ICD-10-CM | POA: Diagnosis not present

## 2022-05-12 DIAGNOSIS — I319 Disease of pericardium, unspecified: Secondary | ICD-10-CM | POA: Diagnosis present

## 2022-05-12 DIAGNOSIS — R778 Other specified abnormalities of plasma proteins: Secondary | ICD-10-CM | POA: Diagnosis not present

## 2022-05-12 DIAGNOSIS — R6520 Severe sepsis without septic shock: Secondary | ICD-10-CM

## 2022-05-12 DIAGNOSIS — I4891 Unspecified atrial fibrillation: Secondary | ICD-10-CM

## 2022-05-12 DIAGNOSIS — I3139 Other pericardial effusion (noninflammatory): Secondary | ICD-10-CM

## 2022-05-12 DIAGNOSIS — A419 Sepsis, unspecified organism: Secondary | ICD-10-CM | POA: Diagnosis not present

## 2022-05-12 DIAGNOSIS — I514 Myocarditis, unspecified: Secondary | ICD-10-CM

## 2022-05-12 HISTORY — DX: Disease of pericardium, unspecified: I31.9

## 2022-05-12 HISTORY — DX: Unspecified atrial fibrillation: I48.91

## 2022-05-12 LAB — SEDIMENTATION RATE: Sed Rate: 65 mm/hr — ABNORMAL HIGH (ref 0–16)

## 2022-05-12 LAB — COMPREHENSIVE METABOLIC PANEL
ALT: 13 U/L (ref 0–44)
AST: 16 U/L (ref 15–41)
Albumin: 2.8 g/dL — ABNORMAL LOW (ref 3.5–5.0)
Alkaline Phosphatase: 44 U/L (ref 38–126)
Anion gap: 9 (ref 5–15)
BUN: 27 mg/dL — ABNORMAL HIGH (ref 8–23)
CO2: 19 mmol/L — ABNORMAL LOW (ref 22–32)
Calcium: 8.1 mg/dL — ABNORMAL LOW (ref 8.9–10.3)
Chloride: 107 mmol/L (ref 98–111)
Creatinine, Ser: 1.51 mg/dL — ABNORMAL HIGH (ref 0.61–1.24)
GFR, Estimated: 49 mL/min — ABNORMAL LOW (ref >60)
Glucose, Bld: 138 mg/dL — ABNORMAL HIGH (ref 70–99)
Potassium: 4.1 mmol/L (ref 3.5–5.1)
Sodium: 135 mmol/L (ref 135–145)
Total Bilirubin: 0.9 mg/dL (ref 0.3–1.2)
Total Protein: 5.7 g/dL — ABNORMAL LOW (ref 6.5–8.1)

## 2022-05-12 LAB — LIPASE, BLOOD: Lipase: 34 U/L (ref 11–51)

## 2022-05-12 LAB — ECHOCARDIOGRAM COMPLETE
Area-P 1/2: 4.31 cm2
Height: 67 in
S' Lateral: 3 cm
Weight: 2560 oz

## 2022-05-12 LAB — URINE CULTURE

## 2022-05-12 LAB — CBC WITH DIFFERENTIAL/PLATELET
Abs Immature Granulocytes: 0.05 10*3/uL (ref 0.00–0.07)
Basophils Absolute: 0 10*3/uL (ref 0.0–0.1)
Basophils Relative: 0 %
Eosinophils Absolute: 0 10*3/uL (ref 0.0–0.5)
Eosinophils Relative: 0 %
HCT: 27.6 % — ABNORMAL LOW (ref 39.0–52.0)
Hemoglobin: 8.9 g/dL — ABNORMAL LOW (ref 13.0–17.0)
Immature Granulocytes: 1 %
Lymphocytes Relative: 4 %
Lymphs Abs: 0.4 10*3/uL — ABNORMAL LOW (ref 0.7–4.0)
MCH: 30.3 pg (ref 26.0–34.0)
MCHC: 32.2 g/dL (ref 30.0–36.0)
MCV: 93.9 fL (ref 80.0–100.0)
Monocytes Absolute: 1.3 10*3/uL — ABNORMAL HIGH (ref 0.1–1.0)
Monocytes Relative: 12 %
Neutro Abs: 8.9 10*3/uL — ABNORMAL HIGH (ref 1.7–7.7)
Neutrophils Relative %: 83 %
Platelets: 177 10*3/uL (ref 150–400)
RBC: 2.94 MIL/uL — ABNORMAL LOW (ref 4.22–5.81)
RDW: 12.6 % (ref 11.5–15.5)
WBC: 10.6 10*3/uL — ABNORMAL HIGH (ref 4.0–10.5)
nRBC: 0 % (ref 0.0–0.2)

## 2022-05-12 LAB — MAGNESIUM: Magnesium: 2 mg/dL (ref 1.7–2.4)

## 2022-05-12 LAB — C-REACTIVE PROTEIN: CRP: 17.9 mg/dL — ABNORMAL HIGH (ref <1.0)

## 2022-05-12 LAB — LITHIUM LEVEL: Lithium Lvl: 0.45 mmol/L — ABNORMAL LOW (ref 0.60–1.20)

## 2022-05-12 LAB — PROCALCITONIN: Procalcitonin: 0.28 ng/mL

## 2022-05-12 LAB — TROPONIN I (HIGH SENSITIVITY): Troponin I (High Sensitivity): 330 ng/L (ref <18)

## 2022-05-12 LAB — HIV ANTIBODY (ROUTINE TESTING W REFLEX): HIV Screen 4th Generation wRfx: NONREACTIVE

## 2022-05-12 MED ORDER — METOPROLOL TARTRATE 5 MG/5ML IV SOLN
2.5000 mg | Freq: Four times a day (QID) | INTRAVENOUS | Status: DC
  Administered 2022-05-12 – 2022-05-14 (×8): 2.5 mg via INTRAVENOUS
  Filled 2022-05-12 (×8): qty 5

## 2022-05-12 MED ORDER — COLCHICINE 0.6 MG PO TABS
0.6000 mg | ORAL_TABLET | Freq: Every day | ORAL | Status: DC
  Administered 2022-05-12: 0.6 mg via ORAL
  Filled 2022-05-12: qty 1

## 2022-05-12 MED ORDER — PREDNISONE 20 MG PO TABS
30.0000 mg | ORAL_TABLET | Freq: Every day | ORAL | Status: DC
  Administered 2022-05-12: 30 mg via ORAL
  Filled 2022-05-12: qty 2

## 2022-05-12 MED ORDER — MORPHINE SULFATE (PF) 2 MG/ML IV SOLN
2.0000 mg | INTRAVENOUS | Status: DC | PRN
  Administered 2022-05-12: 2 mg via INTRAVENOUS
  Filled 2022-05-12: qty 1

## 2022-05-12 MED ORDER — HYDROMORPHONE HCL 1 MG/ML IJ SOLN
0.5000 mg | Freq: Once | INTRAMUSCULAR | Status: AC
  Administered 2022-05-12: 0.5 mg via INTRAVENOUS
  Filled 2022-05-12: qty 1

## 2022-05-12 MED ORDER — MORPHINE SULFATE (PF) 2 MG/ML IV SOLN
2.0000 mg | INTRAVENOUS | Status: DC | PRN
  Administered 2022-05-12 – 2022-05-13 (×6): 2 mg via INTRAVENOUS
  Filled 2022-05-12 (×7): qty 1

## 2022-05-12 MED ORDER — METHYLPREDNISOLONE SODIUM SUCC 125 MG IJ SOLR: 80.0000 mg | Freq: Every day | INTRAMUSCULAR | Status: DC

## 2022-05-12 MED ORDER — VANCOMYCIN HCL IN DEXTROSE 1-5 GM/200ML-% IV SOLN
1000.0000 mg | INTRAVENOUS | Status: DC
  Administered 2022-05-12 – 2022-05-13 (×2): 1000 mg via INTRAVENOUS
  Filled 2022-05-12 (×3): qty 200

## 2022-05-12 MED ORDER — METHYLPREDNISOLONE SODIUM SUCC 125 MG IJ SOLR
125.0000 mg | Freq: Every day | INTRAMUSCULAR | Status: DC
  Administered 2022-05-12 – 2022-05-14 (×3): 125 mg via INTRAVENOUS
  Filled 2022-05-12 (×3): qty 2

## 2022-05-12 MED ORDER — PANTOPRAZOLE SODIUM 40 MG PO TBEC
40.0000 mg | DELAYED_RELEASE_TABLET | Freq: Two times a day (BID) | ORAL | Status: DC
  Administered 2022-05-12 – 2022-05-15 (×7): 40 mg via ORAL
  Filled 2022-05-12 (×7): qty 1

## 2022-05-12 MED ORDER — COLCHICINE 0.6 MG PO TABS
0.6000 mg | ORAL_TABLET | Freq: Two times a day (BID) | ORAL | Status: DC
  Administered 2022-05-12 – 2022-05-15 (×7): 0.6 mg via ORAL
  Filled 2022-05-12 (×8): qty 1

## 2022-05-12 NOTE — Progress Notes (Signed)
Pharmacy Antibiotic Note  Noah Grant is a 71 y.o. male admitted on 05/11/2022 with sepsis.  Pharmacy has been consulted for Cefepime dosing. Already on vancomycin. WBC elevated. Noted renal dysfunction  Plan: Add Cefepime 2g IV q12h Already on vancomycin   Temp (24hrs), Avg:103.1 F (39.5 C), Min:102.2 F (39 C), Max:103.7 F (39.8 C)  Recent Labs  Lab 05/11/22 1711 05/11/22 1743 05/11/22 1911  WBC 15.0*  --   --   CREATININE 1.67* 1.70*  --   LATICACIDVEN 2.4*  --  1.0    CrCl cannot be calculated (Unknown ideal weight.).    Allergies  Allergen Reactions   Hydroxychloroquine Other (See Comments)    Terrible Nightmares   Methotrexate Dermatitis    Developed Mouth Sores   Nsaids Dermatitis    Developed Mouth Blisters   Isosorbide Other (See Comments)    Made him feel weak   Lovastatin Other (See Comments)    Caused abdominal pain that radiated to the back   Pravastatin Other (See Comments)    Caused abdominal pain that radiated to the back   Rosuvastatin Other (See Comments)    Caused abdominal pain that radiated to the back   Sulfasalazine Nausea Only    Narda Bonds, PharmD, [place] Pharmacist Phone: [phone]

## 2022-05-12 NOTE — Progress Notes (Addendum)
Progress Note  Patient Name: Noah Grant Date of Encounter: 05/12/2022  [facility] Cardiologist: Jenean Lindau, MD   Subjective   Flushed, uncomfortable, c/o pleurtic L sided chest discomfort/ upper quadrant.. Blood cultures NG. Crp elevated. CT abd/pelvis did not show any acute issues. [name] +CAC 2 vessel disease  Inpatient Medications    Scheduled Meds:  amLODipine  10 mg Oral Daily   clopidogrel  75 mg Oral Daily   colchicine  0.6 mg Oral BID   enoxaparin (LOVENOX) injection  40 mg Subcutaneous Q24H   lithium carbonate  450 mg Oral Daily   losartan  100 mg Oral Daily   methylPREDNISolone (SOLU-MEDROL) injection  125 mg Intravenous Daily   metoprolol tartrate  2.5 mg Intravenous Q6H   pantoprazole  40 mg Oral BID   simvastatin  5 mg Oral Daily   vancomycin variable dose per unstable renal function (pharmacist dosing)   Does not apply See admin instructions   Continuous Infusions:  sodium chloride Stopped (05/12/22 0215)   ceFEPime (MAXIPIME) IV 2 g (05/12/22 1018)   lactated ringers 125 mL/hr at 05/12/22 0814   metronidazole Stopped (05/12/22 0144)   PRN Meds: sodium chloride, acetaminophen **OR** acetaminophen, hydrALAZINE, morphine injection, nitroGLYCERIN, ondansetron **OR** ondansetron (ZOFRAN) IV, polyethylene glycol   Vital Signs    Vitals:   05/12/22 0800 05/12/22 0818 05/12/22 0830 05/12/22 1006  BP: 118/67   136/73  Pulse: 66  68   Resp: (!) 23  (!) 35   Temp:  97.7 F (36.5 C)    TempSrc:  Oral    SpO2: 98%  96%   Weight:      Height:        Intake/Output Summary (Last 24 hours) at 05/12/2022 1024 Last data filed at 05/12/2022 0609 Gross per 24 hour  Intake 1991 ml  Output 900 ml  Net 1091 ml      05/12/2022    7:51 AM 04/19/2022    9:30 AM 04/09/2022    2:05 PM  Last 3 Weights  Weight (lbs) 160 lb 161 lb 164 lb  Weight (kg) 72.576 kg 73.029 kg 74.39 kg      Telemetry    NSR - Personally Reviewed  ECG    NA - Personally  Reviewed  Physical Exam   Vitals:   05/12/22 0830 05/12/22 1006  BP:  136/73  Pulse: 68   Resp: (!) 35   Temp:    SpO2: 96%     GEN: flushed, mild distress  Neck: No JVD Cardiac: RRR, no murmurs, rubs, or gallops.  Respiratory: Clear to auscultation bilaterally. GI: Soft, nontender, non-distended  MS: No edema; No deformity. Neuro:  Nonfocal  Psych: Normal affect   Labs    High Sensitivity Troponin:   Recent Labs  Lab 05/11/22 1711 05/11/22 1911 05/12/22 0114  TROPONINIHS 332* 441* 330*     Chemistry Recent Labs  Lab 05/11/22 1711 05/11/22 1743 05/12/22 0201  NA 132* 134* 135  K 4.0 4.1 4.1  CL 99 100 107  CO2 19*  --  19*  GLUCOSE 134* 130* 138*  BUN 31* 32* 27*  CREATININE 1.67* 1.70* 1.51*  CALCIUM 8.7*  --  8.1*  MG 2.3  --  2.0  PROT 6.7  --  5.7*  ALBUMIN 3.4*  --  2.8*  AST 21  --  16  ALT 17  --  13  ALKPHOS 49  --  44  BILITOT 0.5  --  0.9  GFRNONAA 44*  --  49*  ANIONGAP 14  --  9    Lipids No results for input(s): CHOL, TRIG, HDL, LABVLDL, LDLCALC, CHOLHDL in the last 168 hours.  Hematology Recent Labs  Lab 05/11/22 1711 05/11/22 1743 05/12/22 0201  WBC 15.0*  --  10.6*  RBC 3.45*  --  2.94*  HGB 10.4* 10.9* 8.9*  HCT 32.3* 32.0* 27.6*  MCV 93.6  --  93.9  MCH 30.1  --  30.3  MCHC 32.2  --  32.2  RDW 12.7  --  12.6  PLT 270  --  177   Thyroid No results for input(s): TSH, FREET4 in the last 168 hours.  BNPNo results for input(s): BNP, PROBNP in the last 168 hours.  DDimer No results for input(s): DDIMER in the last 168 hours.   Radiology    CT Head Wo Contrast  Addendum Date: 05/11/2022   ADDENDUM REPORT: 05/11/2022 22:14 ADDENDUM: Interval increase in size compared to MRI head 06/30/2019 right scalp 14 x 10 mm cm subcutaneus soft tissue lesion with associated calcifications (new from 2012). Recommend correlation with physical exam. These results were called by telephone at the time of interpretation on 05/11/2022 at 10:14 pm to  provider [facility] , who verbally acknowledged these results. Electronically Signed   By: Iven Finn M.D.   On: 05/11/2022 22:14   Result Date: 05/11/2022 CLINICAL DATA:  Neuro deficit, acute, stroke suspected EXAM: CT HEAD WITHOUT CONTRAST TECHNIQUE: Contiguous axial images were obtained from the base of the skull through the vertex without intravenous contrast. RADIATION DOSE REDUCTION: This exam was performed according to the departmental dose-optimization program which includes automated exposure control, adjustment of the mA and/or kV according to patient size and/or use of iterative reconstruction technique. COMPARISON:  MRI head 06/30/2019 BRAIN: BRAIN Cerebral ventricle sizes are concordant with the degree of cerebral volume loss. Patchy and confluent areas of decreased attenuation are noted throughout the deep and periventricular white matter of the cerebral hemispheres bilaterally, compatible with chronic microvascular ischemic disease. Chronic left centrum semiovale lacunar infarction. Chronic right basal ganglia lacunar infarction. No evidence of large-territorial acute infarction. No parenchymal hemorrhage. No mass lesion. No extra-axial collection. No mass effect or midline shift. No hydrocephalus. Basilar cisterns are patent. Vascular: No hyperdense vessel. Atherosclerotic calcifications are present within the cavernous internal carotid and vertebral arteries. Skull: No acute fracture or focal lesion. Sinuses/Orbits: Paranasal sinuses and mastoid air cells are clear. The orbits are unremarkable. Other: None. IMPRESSION: No acute intracranial abnormality. Electronically Signed: By: Iven Finn M.D. On: 05/11/2022 18:47   CT Angio Chest PE W and/or Wo Contrast  Result Date: 05/11/2022 CLINICAL DATA:  Pulmonary embolism (PE) suspected, high prob. Generalized weakness. EXAM: CT ANGIOGRAPHY CHEST WITH CONTRAST TECHNIQUE: Multidetector CT imaging of the chest was performed using the  standard protocol during bolus administration of intravenous contrast. Multiplanar CT image reconstructions and MIPs were obtained to evaluate the vascular anatomy. RADIATION DOSE REDUCTION: This exam was performed according to the departmental dose-optimization program which includes automated exposure control, adjustment of the mA and/or kV according to patient size and/or use of iterative reconstruction technique. CONTRAST:  47mL OMNIPAQUE IOHEXOL 350 MG/ML SOLN COMPARISON:  CT chest 12/21/2018 FINDINGS: Cardiovascular: Satisfactory opacification of the pulmonary arteries to the segmental level. No evidence of pulmonary embolism. Slightly limited evaluation of the subsegmental level due to motion artifact and atelectasis. The main pulmonary artery is normal in caliber. Prominent right ventricle. Trace pericardial effusion. The thoracic aorta  is normal in caliber. Moderate atherosclerotic plaque of the thoracic aorta. At least 2 vessel coronary artery calcifications. Mediastinum/Nodes: No enlarged mediastinal, hilar, or axillary lymph nodes. Thyroid gland, trachea, and esophagus demonstrate no significant findings. Lungs/Pleura: No focal consolidation. No pulmonary nodule. No pulmonary mass. Trace bilateral, left greater than right, pleural effusions. Associated bilateral lower lobe passive atelectasis. No pneumothorax. Upper Abdomen: No acute abnormality.  Splenule noted. Musculoskeletal: No chest wall abnormality. No suspicious lytic or blastic osseous lesions. No acute displaced fracture. Old healed right rib fractures. Plate and screw fixation of an old healed right clavicular fracture. Review of the MIP images confirms the above findings. IMPRESSION: 1. No pulmonary embolus with slightly limited evaluation of the subsegmental level due to motion artifact and atelectasis. 2. Trace pericardial effusion and trace bilateral pleural effusions, left greater than right. 3. Prominent right cardiac ventricle. 4.  Aortic Atherosclerosis (ICD10-I70.0) with at least 2 vessel coronary calcification. Electronically Signed   By: Iven Finn M.D.   On: 05/11/2022 23:10   CT ABDOMEN PELVIS W CONTRAST  Result Date: 05/11/2022 CLINICAL DATA:  Abdominal pain, acute, nonlocalized EXAM: CT ABDOMEN AND PELVIS WITH CONTRAST TECHNIQUE: Multidetector CT imaging of the abdomen and pelvis was performed using the standard protocol following bolus administration of intravenous contrast. RADIATION DOSE REDUCTION: This exam was performed according to the departmental dose-optimization program which includes automated exposure control, adjustment of the mA and/or kV according to patient size and/or use of iterative reconstruction technique. CONTRAST:  48mL OMNIPAQUE IOHEXOL 300 MG/ML  SOLN COMPARISON:  None Available. FINDINGS: Lower chest: Elevated left hemidiaphragm. Posterior right lower lobe dependent passive atelectasis. Coronary calcifications. Hepatobiliary: No focal liver abnormality. Contracted gallbladder. No gallstones, gallbladder wall thickening, or pericholecystic fluid. No biliary dilatation. Pancreas: No focal lesion. Normal pancreatic contour. No surrounding inflammatory changes. No main pancreatic ductal dilatation. Spleen: Normal in size without focal abnormality.  Splenule noted. Adrenals/Urinary Tract: No adrenal nodule bilaterally. Bilateral kidneys enhance symmetrically. No hydronephrosis. No hydroureter. The urinary bladder is unremarkable. Stomach/Bowel: Stomach is within normal limits. No evidence of bowel wall thickening or dilatation. Scattered colonic diverticulosis. The appendix is not definitely identified with no inflammatory changes in the right lower quadrant to suggest acute appendicitis. Vascular/Lymphatic: No abdominal aorta or iliac aneurysm. Severe atherosclerotic plaque of the aorta and its branches. No abdominal, pelvic, or inguinal lymphadenopathy. Reproductive: The prostate measures mildly enlarged  (5 cm). Other: No intraperitoneal free fluid. No intraperitoneal free gas. No organized fluid collection. Musculoskeletal: No abdominal wall hernia or abnormality. No suspicious lytic or blastic osseous lesions. No acute displaced fracture. IMPRESSION: 1. No acute intra-abdominal or intrapelvic abnormality. 2. Scattered colonic diverticulosis with no acute diverticulitis. 3. Prostatomegaly. 4. Aortic Atherosclerosis (ICD10-I70.0) - severe. Electronically Signed   By: Iven Finn M.D.   On: 05/11/2022 18:58   DG Chest Port 1 View  Result Date: 05/11/2022 CLINICAL DATA:  Weakness for a week. EXAM: PORTABLE CHEST 1 VIEW COMPARISON:  May 14, 2021 FINDINGS: Stable cardiomegaly. The hila and mediastinum are unremarkable. No pneumothorax. No nodules or masses. No focal infiltrates. Healed right rib fractures. Repair of right clavicular fracture. IMPRESSION: No active disease. Electronically Signed   By: Dorise Bullion III M.D.   On: 05/11/2022 18:03    Cardiac Studies   Echo pending  LHC 09/06/2020 1.  Severe single-vessel coronary artery disease involving the right PDA Mirissa Lopresti, treated successfully with overlapping drug-eluting stents, 0% residual stenosis 2.  Mild nonobstructive disease in the mid LAD 3.  Normal  left main and left circumflex 4.  Known normal LV function by echo assessment with LVEF 60%   Recommendations: Same-day PCI protocol, aspirin and clopidogrel for 6 months without interruption.  Patient Profile     MANARD GAILES is a 71 y.o. male with CAD s/p DES x2 to [place] (08/2020), HTN, HLD, CKDIII, RA, BPD type 1, GERD, GAD, BPH and vit B12 deficiency who is being seen today for the evaluation of elevated hsT, in the setting of SIRs and pleuritic chest pain at the request of Christian Prosperi  Assessment & Plan    Myopericarditis: he has significant pleuritic chest pain in the setting of SIRs. EKG does not show PR depression or diffuse STE. CRP is high, but suspect this with SIRs  He has RA which also increases risk of a pericarditis. Considering this and his symptoms however, agree with continuing management with colchcine and  IV methylprednisone. Can plan for prednisone taper on discharge and 3 months of colchicine. He does not have signs of heart failure.  - continue colchicine - continue IV methylpred - FU echo - can consider CMR - on antibiotics; pending FU cultures, no source  Ischemic Heart disease: no anginal symptoms. Don't suspect his symptoms are c/w ischemia - no plans for an ischemic evaluation - can hold home BB for now with possible Sirs to sepsis - continue statin    For questions or updates, please contact [place] Please consult [email or web address] for contact info under        Signed, Janina Mayo, MD  05/12/2022, 10:24 AM

## 2022-05-12 NOTE — Assessment & Plan Note (Signed)
.   Continuing home regimen of lipid lowering therapy.  

## 2022-05-12 NOTE — Assessment & Plan Note (Addendum)
   Patient presenting with multiple SIRS criteria including tachycardia, extremely high fevers and leukocytosis   Patient additionally exhibiting evidence of organ dysfunction with markedly elevated troponins and lactic acidosis  A thorough work-up has been undertaken which has not been able to identify a definitive source of infection  That being said, patient's presentation of severe chest pain that improves with leaning forward, a pericardial friction rub on examination, elevated troponin and small pericardial effusion on CT imaging are all concerning for pericarditis  Typically pericarditis would be inflammatory in this patient with rheumatoid arthritis however considering patient is extremely immunocompromised on leflunomide and infectious cause of the pericarditis must be considered  Keeping patient on broad-spectrum antibiotics for now  Intravenous volume resuscitation with isotonic fluids.  Blood cultures have been obtained  Awaiting consultation by cardiology, I have discussed the case with them multiple times and while they agree the pericarditis possibility is atypical it is the most likely diagnosis at this point.  We will additionally obtain infectious disease consultation in the morning  Finally, we will treat pericarditis with an evidence-based regimen of 0.5 mg/kg of prednisone daily in addition to colchicine 0.5 mg twice daily unfortunately NSAIDs are not an option in this patient with renal disease.  Close, for monitoring in the progressive unit

## 2022-05-12 NOTE — Assessment & Plan Note (Signed)
   Patient developed rapid atrial fibrillation shortly after arrival to the emergency department.  This further brings about concern for a possible underlying pericarditis  Managing rate control with scheduled low-dose intravenous metoprolol  Holding off on anticoagulation for now  Monitoring patient on telemetry  Echocardiogram in the morning  Monitoring electrolytes closely

## 2022-05-12 NOTE — Progress Notes (Signed)
   05/12/22 2013  Assess: MEWS Score  Temp 97.9 F (36.6 C)  BP 121/71  MAP (mmHg) 80  Pulse Rate (!) 135  ECG Heart Rate (!) 127  Resp 20  SpO2 96 %  O2 Device Nasal Cannula  Assess: MEWS Score  MEWS Temp 0  MEWS Systolic 0  MEWS Pulse 2  MEWS RR 0  MEWS LOC 0  MEWS Score 2  MEWS Score Color Yellow  Assess: if the MEWS score is Yellow or Red  Were vital signs taken at a resting state? Yes  Focused Assessment No change from prior assessment  Does the patient meet 2 or more of the SIRS criteria? No  MEWS guidelines implemented *See Row Information* Yes  Treat  MEWS Interventions Escalated (See documentation below)  Pain Scale 0-10  Pain Score 4  Pain Type Acute pain  Pain Location Breast  Pain Orientation Left  Pain Radiating Towards denies  Pain Descriptors / Indicators Aching  Pain Frequency Constant  Pain Onset On-going  Patients Stated Pain Goal 2  Pain Intervention(s) Other (Comment) (pain med already given)  Multiple Pain Sites No  Take Vital Signs  Increase Vital Sign Frequency  Yellow: Q 2hr X 2 then Q 4hr X 2, if remains yellow, continue Q 4hrs  Escalate  MEWS: Escalate Yellow: discuss with charge nurse/RN and consider discussing with provider and RRT  Notify: Charge Nurse/RN  Name of Charge Nurse/RN Notified Francene Castle, RN  Date Charge Nurse/RN Notified 05/12/22  Time Charge Nurse/RN Notified 2017  Document  Patient Outcome Other (Comment) (pt remains stable)  Progress note created (see row info) Yes  Assess: SIRS CRITERIA  SIRS Temperature  0  SIRS Pulse 1  SIRS Respirations  0  SIRS WBC 0  SIRS Score Sum  1

## 2022-05-12 NOTE — Progress Notes (Addendum)
Pharmacy Antibiotic Note  Noah Grant is a 71 y.o. male for which pharmacy has been consulted for vancomycin and cefepime intially for sepsis. CT imaging concerning for pericarditis  Patient with a history of bipolar disorder, HLD, anxiety, stroke, RA, tremor. Patient presenting with weakness, difficulty standing, worsening tremors for the last 3 days.  SCr 1.7>>1.51  WBC 15>>10.6; LA 1; T 102.7 F >> 97.7; HR 94>>68; RR 24>>20 COVID/flu - neg  Plan: Vancomycin started with PRN dosing as SCr was up. Has come back now so will schedule Cefepime 2g q12hr Vancomycin 1500 mg once then 1000 mg q24hr (eAUC 481) unless change in renal function Trend WBC, Fever, Renal function, & Clinical course F/u cultures, clinical course, WBC, fever De-escalate when able  Height: 5\' 7"  (170.2 cm) Weight: 72.6 kg (160 lb) IBW/kg (Calculated) : 66.1  Temp (24hrs), Avg:100.9 F (38.3 C), Min:97.7 F (36.5 C), Max:103.7 F (39.8 C)  Recent Labs  Lab 05/11/22 1711 05/11/22 1743 05/11/22 1911 05/12/22 0201  WBC 15.0*  --   --  10.6*  CREATININE 1.67* 1.70*  --  1.51*  LATICACIDVEN 2.4*  --  1.0  --      Estimated Creatinine Clearance: 42.6 mL/min (A) (by C-G formula based on SCr of 1.51 mg/dL (H)).    Allergies  Allergen Reactions   Hydroxychloroquine Other (See Comments)    Terrible Nightmares   Methotrexate Dermatitis    Developed Mouth Sores   Nsaids Dermatitis    Developed Mouth Blisters   Isosorbide Other (See Comments)    Made him feel weak   Lovastatin Other (See Comments)    Caused abdominal pain that radiated to the back   Pravastatin Other (See Comments)    Caused abdominal pain that radiated to the back   Rosuvastatin Other (See Comments)    Caused abdominal pain that radiated to the back   Sulfasalazine Nausea Only    Antimicrobials this admission: ceftriaxone 6/3 >> 6/3 cefepime 6/3 vancomycin 6/3 >>  Microbiology results: Pending  Thank you for allowing  pharmacy to be a part of this patient's care.  Lorelei Pont, PharmD, BCPS 05/12/2022 12:11 PM ED Clinical Pharmacist -  [phone]

## 2022-05-12 NOTE — Assessment & Plan Note (Signed)
   Continue home regimen of lithium  Obtain lithium level with morning labs

## 2022-05-12 NOTE — Consult Note (Addendum)
Cardiology Consultation:   Patient ID: ELAM APODACA MRN: [id number]; DOB: 10/10/1951  Admit date: 05/11/2022 Date of Consult: 05/12/2022  Primary Care Provider: [name], [facility] Cardiologist: Jenean Lindau, MD  [facility], [facility] Electrophysiologist:  None   Patient Profile:   OVED PALMBERG is a 71 y.o. male with CAD s/p DES x2 to [place] (08/2020), HTN, HLD, CKDIII, RA, BPD type 1, GERD, GAD, BPH and vit B12 deficiency who is being seen today for the evaluation of elevated hsT at the request of Quentin Mulling.  History of Present Illness:   Mr. Winns was brought in by [facility] for which she initially described weakness of 1 week duration along with intermittent shaking episodes.  Her chart review he was shaking on arrival and was "aphasic and rigid."  Reported a prior history of TIA but no seizures.  He was given Ativan 2 mg IV for presumed seizures.  He is on lithium for control of his bipolar disorder.  He was reportedly following commands and conscious but initially had some dysarthria without any other focal neurologic deficits.  He was found to be febrile (T 102.57F), covid/flu negative with leukocytosis (WBC 15k).  Subsequent Tmax 103.76F.  He was started on ceftriaxone and vancomycin for more presumed sepsis and possible rigors.  Lab work revealed AKI (sCr 1.67) on CKD (sCr 1.4-1.5), lactic acidosis (2.4), elevated hsT (332->441).   During my evaluation:  VS: P 71, BP 119/63 (79), O2 96%/RA  He was in acute distress during my evaluation and was initially reporting focal left lower quadrant severe abdominal pain.  Palpation of his left side with significant pain however palpation with central abdomen also produce similar pain.  Palpation over right abdomen and chest without any discomfort.  He reported abdominal pain of 1 week duration which was progressive and worsening but not constant.  He noticed that around a week ago he started to become  more fatigued and thought it was related to anemia secondary to his leflunomide which had occurred before.  He then noticed some pain in his shoulder, neck, and legs which was also progressive over the past week that he initially thought this was a rheumatoid arthritis flare however it was more in the muscles and joints.  He then started shaking and his wife said that his face was "drawn" and she was concerned he was having a stroke.  He then noticed the shaking get worse but never had loss of consciousness and called EMS.  He did have 1 episode of chest pain in the emergency department for which she was given sublingual nitroglycerin with resolution.  He has had these episodes of chest pain every few weeks and they always go away with nitroglycerin although sometimes he will go several weeks without any at all.   Review of imaging without any intraabdominal pathology to explain his symptoms of diffuse pain.   Past Medical History:  Diagnosis Date   Abdominal aortic atherosclerosis ([place])    Anemia    likely of chronic disease   Angina pectoris ([place]) 08/30/2020   Arthritis    Barrett's esophagus without dysplasia 03/25/2019   Formatting of this note might be different from the original. EGD done in 02/2019. Due in 3 years.   Bipolar 1 disorder ([place])    Bipolar 1 disorder, mixed, moderate ([place]) 08/16/2014   Bipolar affective disorder, currently depressed, moderate ([place]) 03/08/2020   Bipolar depression ([place]) 08/16/2014   BPH with urinary obstruction 12/29/2018  Brain ischemia 03/08/2020   Cardiac murmur 08/30/2020   Chest pain 09/06/2020   Chicken pox    Chronic bronchitis (HCC)    Cognitive complaints 03/08/2020   Coronary artery disease involving native coronary artery of native heart with angina pectoris ([employer]) 09/06/2020   Depression    Disc degeneration, 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   Drug therapy 05/18/2021   Essential hypertension    GAD (generalized anxiety disorder) 11/02/2018    Gastroesophageal reflux disease 08/16/2014   Formatting of this note might be different from the original. Last Assessment & Plan:  Well controlled. Continue current medications.   Gastroesophageal reflux disease without esophagitis 08/16/2014   Formatting of this note might be different from the original. Last Assessment & Plan:  Well controlled. Continue current medications.   Herpes gingivostomatitis 02/10/2015   History of colon polyps 03/25/2019   Formatting of this note might be different from the original. tubular adenoma   History of stroke 11/10/2019   Hyperlipidemia    Hypertension    Ingrowing nail 03/12/2020   Labile hypertension 08/16/2014   Formatting of this note might be different from the original. Last Assessment & Plan:  Slightly above goal today secondary to pain. Will continue current regimen. Will check CMP today.   Lichen planus [id number]   Last Assessment & Plan:  Formatting of this note might be different from the original. Concern over possible lichen planus. His dentist noticed lesions of his oral mucosa.  It was felt to be lichen planus.  He presents to discuss further.  Rarely does the area hurt.He smoked in the distant past EXAM shows several white patches on the buccal mucosa bilaterally.  No other concerning oral mucosal les   Long-term use of immunosuppressant medication 03/23/2018   Low testosterone 09/17/2016   Midline thoracic back pain 02/10/2015   Mixed hyperlipidemia 08/16/2014   Normochromic normocytic anemia 08/16/2014   PAD (peripheral artery disease) ([place]) 03/23/2019   Pain of right hip joint 01/19/2016   Rheumatic fever    Rheumatoid arthritis ([place]) 10/29/2018   Formatting of this note might be different from the original. Rheum:  Dr. Gerilyn Nestle Formerly on MTX - stopped due to mouth sores. Currently started on Arava.  Also on 5 mg prednisone x 3 months during run-in.   Right-sided chest wall pain 04/17/2019   Seasonal allergies 03/23/2018    Stage 3a chronic kidney disease ([place]) 07/21/2019   Statin intolerance 07/31/2020   Thoracic radiculopathy 06/16/2019   Tremor 06/16/2019   Vitamin B12 deficiency    Injections   Vitamin D deficiency 09/17/2016   Past Surgical History:  Procedure Laterality Date   CLAVICLE SURGERY     Right, Hardware placed   CORONARY STENT INTERVENTION N/A 09/06/2020   Procedure: CORONARY STENT INTERVENTION;  Surgeon: Sherren Mocha, MD;  Location: [facility];  Service: Cardiovascular;  Laterality: N/A;   LEFT HEART CATH AND CORONARY ANGIOGRAPHY N/A 09/06/2020   Procedure: LEFT HEART CATH AND CORONARY ANGIOGRAPHY;  Surgeon: Sherren Mocha, MD;  Location: [facility];  Service: Cardiovascular;  Laterality: N/A;   WISDOM TOOTH EXTRACTION      Home Medications:  Prior to Admission medications   Medication Sig Start Date End Date Taking? Authorizing Provider  amLODipine (NORVASC) 10 MG tablet Take 1 tablet (10 mg total) by mouth daily. 01/02/22   [name], DO  Cholecalciferol 50 MCG (2000 UT) CAPS Take 2,000 Units by mouth daily.     [provider]  clopidogrel (PLAVIX) 75  MG tablet TAKE ONE TABLET BY MOUTH DAILY 12/17/21   Revankar, Aundra Dubin, MD  Coenzyme Q10 (COQ10) 200 MG CAPS Take 200 mg by mouth daily.    [provider]  ezetimibe (ZETIA) 10 MG tablet TAKE ONE TABLET BY MOUTH DAILY 10/17/21   Revankar, Aundra Dubin, MD  hydrALAZINE (APRESOLINE) 50 MG tablet Take 50 mg by mouth 3 (three) times daily.    [provider]  labetalol (NORMODYNE) 100 MG tablet Take 1 tablet (100 mg total) by mouth 2 (two) times daily. 01/02/22   Sharlene Dory, DO  leflunomide (ARAVA) 20 MG tablet Take 20 mg by mouth every Monday, Tuesday, Wednesday, Thursday, and Friday.    [provider]  lithium carbonate 150 MG capsule TAKE THREE CAPSULES BY MOUTH DAILY 04/08/22   Cottle, Steva Ready., MD  losartan (COZAAR) 100 MG tablet Take 100 mg by mouth daily.     [provider]  Lysine HCl 1000 MG TABS Take 1,000 mg by mouth 2 (two) times daily.     [provider]  nitroGLYCERIN (NITROSTAT) 0.4 MG SL tablet Place 1 tablet (0.4 mg total) under the tongue every 5 (five) minutes as needed for chest pain. 01/22/22   Revankar, Aundra Dubin, MD  pantoprazole (PROTONIX) 40 MG tablet Take 1 tablet (40 mg total) by mouth daily. 01/02/22   Sharlene Dory, DO  predniSONE (DELTASONE) 5 MG tablet Take 1 tablet by mouth 2 (two) times daily. Patient not taking: Reported on 04/19/2022 09/11/21   [provider]  simvastatin (ZOCOR) 5 MG tablet Take 1 tablet (5 mg total) by mouth daily. 01/02/22   Sharlene Dory, DO    Inpatient Medications: Scheduled Meds:  amLODipine  10 mg Oral Daily   clopidogrel  75 mg Oral Daily   enoxaparin (LOVENOX) injection  40 mg Subcutaneous Q24H   lithium carbonate  450 mg Oral Daily   losartan  100 mg Oral Daily   metoprolol tartrate  2.5 mg Intravenous Q6H   pantoprazole  40 mg Oral Daily   simvastatin  5 mg Oral Daily   vancomycin variable dose per unstable renal function (pharmacist dosing)   Does not apply See admin instructions   Continuous Infusions:  sodium chloride Stopped (05/12/22 0215)   ceFEPime (MAXIPIME) IV Stopped (05/12/22 0042)   lactated ringers 125 mL/hr at 05/11/22 2355   metronidazole Stopped (05/12/22 0144)   PRN Meds: sodium chloride, acetaminophen **OR** acetaminophen, hydrALAZINE, morphine injection, nitroGLYCERIN, ondansetron **OR** ondansetron (ZOFRAN) IV, polyethylene glycol  Allergies:    Allergies  Allergen Reactions   Hydroxychloroquine Other (See Comments)    Terrible Nightmares   Methotrexate Dermatitis    Developed Mouth Sores   Nsaids Dermatitis    Developed Mouth Blisters   Isosorbide Other (See Comments)    Made him feel weak   Lovastatin Other (See Comments)    Caused abdominal pain that radiated to the back   Pravastatin Other (See Comments)     Caused abdominal pain that radiated to the back   Rosuvastatin Other (See Comments)    Caused abdominal pain that radiated to the back   Sulfasalazine Nausea Only    Social History:   Social History   Socioeconomic History   Marital status: Married    Spouse name: Not on file   Number of children: Not on file   Years of education: Not on file   Highest education level: Not on file  Occupational History   Not on  file  Tobacco Use   Smoking status: Former    Packs/day: 2.00    Years: 10.00    Pack years: 20.00    Types: Cigarettes    Quit date: 49    Years since quitting: 49.4   Smokeless tobacco: Never   Tobacco comments:    Quit>40 years  Vaping Use   Vaping Use: Never used  Substance and Sexual Activity   Alcohol use: Not Currently    Alcohol/week: 0.0 standard drinks   Drug use: Never   Sexual activity: Not on file  Other Topics Concern   Not on file  Social History Narrative   Not on file   Social Determinants of Health   Financial Resource Strain: Not on file  Food Insecurity: Not on file  Transportation Needs: Not on file  Physical Activity: Not on file  Stress: Not on file  Social Connections: Not on file  Intimate Partner Violence: Not on file    Family History:    Family History  Problem Relation Age of Onset   Alzheimer's disease Mother 8       Deceased in early 14s   Stroke Father 86       Deceased   Hypertension Father    Alcoholism Father    Alcoholism Brother    Heart disease Maternal Grandfather 37       Deceased   Heart disease Paternal Grandfather 17       Deceased   Hypertension Son     ROS:  Review of Systems: [y] = yes, [ ]  = no      General: Weight gain [ ] ; Weight loss [ ] ; Anorexia [ ] ; Fatigue [y]; Fever [y]; Chills [y]; Weakness [y]   Cardiac: Chest pain/pressure [y]; Resting SOB [ ] ; Exertional SOB [ ] ; Orthopnea [ ] ; Pedal Edema [ ] ; Palpitations [ ] ; Syncope [ ] ; Presyncope [ ] ; Paroxysmal nocturnal dyspnea [ ]     Pulmonary: Cough [ ] ; Wheezing [ ] ; Hemoptysis [ ] ; Sputum [ ] ; Snoring [ ]    GI: Vomiting [ ] ; Dysphagia [ ] ; Melena [ ] ; Hematochezia [ ] ; Heartburn [ ] ; Abdominal pain [ ] ; Constipation [ ] ; Diarrhea [ ] ; BRBPR [ ]    GU: Hematuria [ ] ; Dysuria [ ] ; Nocturia [ ]  Vascular: Pain in legs with walking [ ] ; Pain in feet with lying flat [ ] ; Non-healing sores [ ] ; Stroke [ ] ; TIA [ ] ; Slurred speech [ ] ;   Neuro: Headaches [ ] ; Vertigo [ ] ; Seizures [ ] ; Paresthesias [ ] ;Blurred vision [ ] ; Diplopia [ ] ; Vision changes [ ]    Ortho/Skin: Arthritis [ ] ; Joint pain [ ] ; Muscle pain [ ] ; Joint swelling [ ] ; Back Pain [ ] ; Rash [ ]    Psych: Depression [ ] ; Anxiety [ ]    Heme: Bleeding problems [ ] ; Clotting disorders [ ] ; Anemia [ ]    Endocrine: Diabetes [ ] ; Thyroid dysfunction [ ]    Physical Exam/Data:   Vitals:   05/12/22 0215 05/12/22 0230 05/12/22 0300 05/12/22 0330  BP:  112/63 (!) 124/59 117/64  Pulse:  69 62 (!) 55  Resp:  17 13 16   Temp: 98.3 F (36.8 C)     TempSrc: Oral     SpO2:  93% 97% 92%    Intake/Output Summary (Last 24 hours) at 05/12/2022 0342 Last data filed at 05/12/2022 0144 Gross per 24 hour  Intake 1991 ml  Output --  Net 1991 ml  04/19/2022    9:30 AM 04/09/2022    2:05 PM 04/02/2022   10:42 AM  Last 3 Weights  Weight (lbs) 161 lb 164 lb 162 lb 0.6 oz  Weight (kg) 73.029 kg 74.39 kg 73.501 kg     There is no height or weight on file to calculate BMI.  General: in acute distress  HEENT: normal Lymph: no adenopathy Neck: no JVD Endocrine:  No thryomegaly Vascular: No carotid bruits; FA pulses 2+ bilaterally without bruits  Cardiac:  normal S1, S2; RRR; no murmur  Lungs:  clear to auscultation bilaterally, no wheezing, rhonchi or rales  Abd: diffusely tender through LLQ and central abdomen  Ext: no edema Musculoskeletal:  No deformities, BUE and BLE strength normal and equal Skin: warm and dry  Neuro:  CNs 2-12 intact, no focal abnormalities  noted Psych:  Normal affect   EKG:  The EKG was personally reviewed (05/11/22, 17:12:23) and demonstrates: NSR with ventricular rate 96 bpm, PR 149 ms, QRS 132 ms, Qtc 438 ms, RBBB/LPFB, Q waves inferior leads  Telemetry:  Telemetry was personally reviewed and demonstrates: NSR and one sustained episode of AF/RVR with ventricular rates 110-130s  Relevant CV Studies:  Coronary angiography  Result date: 09/06/20 1.  Severe single-vessel coronary artery disease involving the right PDA branch, treated successfully with overlapping drug-eluting stents, 0% residual stenosis 2.  Mild nonobstructive disease in the mid LAD 3.  Normal left main and left circumflex 4.  Known normal LV function by echo assessment with LVEF 60%  TTE  Result date: 08/31/20  1. Elevated pressure half time seen in isolation. Spectral Doppler would  be consistent with greater than mild pulmonic insufficiency. If  clinincally indicated, would recommended repeat limited study.   2. Left ventricular ejection fraction, by estimation, is 55 to 60%. The  left ventricle has normal function. The left ventricle has no regional  wall motion abnormalities. Left ventricular diastolic parameters were  normal.   3. Right ventricular systolic function is normal. The right ventricular  size is normal. There is mildly elevated pulmonary artery systolic  pressure.   4. The mitral valve is normal in structure. Mild mitral valve  regurgitation.   5. The aortic valve is normal in structure. Aortic valve regurgitation is  not visualized.   6. The inferior vena cava is normal in size with <50% respiratory  variability, suggesting right atrial pressure of 8 mmHg.   Laboratory Data:  High Sensitivity Troponin:   Recent Labs  Lab 05/11/22 1711 05/11/22 1911 05/12/22 0114  TROPONINIHS 332* 441* 330*     Chemistry Recent Labs  Lab 05/11/22 1711 05/11/22 1743 05/12/22 0201  NA 132* 134* 135  K 4.0 4.1 4.1  CL 99 100 107  CO2  19*  --  19*  GLUCOSE 134* 130* 138*  BUN 31* 32* 27*  CREATININE 1.67* 1.70* 1.51*  CALCIUM 8.7*  --  8.1*  GFRNONAA 44*  --  49*  ANIONGAP 14  --  9    Recent Labs  Lab 05/11/22 1711 05/12/22 0201  PROT 6.7 5.7*  ALBUMIN 3.4* 2.8*  AST 21 16  ALT 17 13  ALKPHOS 49 44  BILITOT 0.5 0.9   Hematology Recent Labs  Lab 05/11/22 1711 05/11/22 1743 05/12/22 0201  WBC 15.0*  --  10.6*  RBC 3.45*  --  2.94*  HGB 10.4* 10.9* 8.9*  HCT 32.3* 32.0* 27.6*  MCV 93.6  --  93.9  MCH 30.1  --  30.3  MCHC 32.2  --  32.2  RDW 12.7  --  12.6  PLT 270  --  177   BNPNo results for input(s): BNP, PROBNP in the last 168 hours.  DDimer No results for input(s): DDIMER in the last 168 hours.  Radiology/Studies:  CT Head Wo Contrast  Addendum Date: 05/11/2022   ADDENDUM REPORT: 05/11/2022 22:14 ADDENDUM: Interval increase in size compared to MRI head 06/30/2019 right scalp 14 x 10 mm cm subcutaneus soft tissue lesion with associated calcifications (new from 2012). Recommend correlation with physical exam. These results were called by telephone at the time of interpretation on 05/11/2022 at 10:14 pm to provider [facility] , who verbally acknowledged these results. Electronically Signed   By: Iven Finn M.D.   On: 05/11/2022 22:14   Result Date: 05/11/2022 CLINICAL DATA:  Neuro deficit, acute, stroke suspected EXAM: CT HEAD WITHOUT CONTRAST TECHNIQUE: Contiguous axial images were obtained from the base of the skull through the vertex without intravenous contrast. RADIATION DOSE REDUCTION: This exam was performed according to the departmental dose-optimization program which includes automated exposure control, adjustment of the mA and/or kV according to patient size and/or use of iterative reconstruction technique. COMPARISON:  MRI head 06/30/2019 BRAIN: BRAIN Cerebral ventricle sizes are concordant with the degree of cerebral volume loss. Patchy and confluent areas of decreased attenuation are  noted throughout the deep and periventricular white matter of the cerebral hemispheres bilaterally, compatible with chronic microvascular ischemic disease. Chronic left centrum semiovale lacunar infarction. Chronic right basal ganglia lacunar infarction. No evidence of large-territorial acute infarction. No parenchymal hemorrhage. No mass lesion. No extra-axial collection. No mass effect or midline shift. No hydrocephalus. Basilar cisterns are patent. Vascular: No hyperdense vessel. Atherosclerotic calcifications are present within the cavernous internal carotid and vertebral arteries. Skull: No acute fracture or focal lesion. Sinuses/Orbits: Paranasal sinuses and mastoid air cells are clear. The orbits are unremarkable. Other: None. IMPRESSION: No acute intracranial abnormality. Electronically Signed: By: Iven Finn M.D. On: 05/11/2022 18:47   CT Angio Chest PE W and/or Wo Contrast  Result Date: 05/11/2022 CLINICAL DATA:  Pulmonary embolism (PE) suspected, high prob. Generalized weakness. EXAM: CT ANGIOGRAPHY CHEST WITH CONTRAST TECHNIQUE: Multidetector CT imaging of the chest was performed using the standard protocol during bolus administration of intravenous contrast. Multiplanar CT image reconstructions and MIPs were obtained to evaluate the vascular anatomy. RADIATION DOSE REDUCTION: This exam was performed according to the departmental dose-optimization program which includes automated exposure control, adjustment of the mA and/or kV according to patient size and/or use of iterative reconstruction technique. CONTRAST:  33mL OMNIPAQUE IOHEXOL 350 MG/ML SOLN COMPARISON:  CT chest 12/21/2018 FINDINGS: Cardiovascular: Satisfactory opacification of the pulmonary arteries to the segmental level. No evidence of pulmonary embolism. Slightly limited evaluation of the subsegmental level due to motion artifact and atelectasis. The main pulmonary artery is normal in caliber. Prominent right ventricle. Trace  pericardial effusion. The thoracic aorta is normal in caliber. Moderate atherosclerotic plaque of the thoracic aorta. At least 2 vessel coronary artery calcifications. Mediastinum/Nodes: No enlarged mediastinal, hilar, or axillary lymph nodes. Thyroid gland, trachea, and esophagus demonstrate no significant findings. Lungs/Pleura: No focal consolidation. No pulmonary nodule. No pulmonary mass. Trace bilateral, left greater than right, pleural effusions. Associated bilateral lower lobe passive atelectasis. No pneumothorax. Upper Abdomen: No acute abnormality.  Splenule noted. Musculoskeletal: No chest wall abnormality. No suspicious lytic or blastic osseous lesions. No acute displaced fracture. Old healed right rib fractures. Plate and screw fixation of an old healed  right clavicular fracture. Review of the MIP images confirms the above findings. IMPRESSION: 1. No pulmonary embolus with slightly limited evaluation of the subsegmental level due to motion artifact and atelectasis. 2. Trace pericardial effusion and trace bilateral pleural effusions, left greater than right. 3. Prominent right cardiac ventricle. 4. Aortic Atherosclerosis (ICD10-I70.0) with at least 2 vessel coronary calcification. Electronically Signed   By: Iven Finn M.D.   On: 05/11/2022 23:10   CT ABDOMEN PELVIS W CONTRAST  Result Date: 05/11/2022 CLINICAL DATA:  Abdominal pain, acute, nonlocalized EXAM: CT ABDOMEN AND PELVIS WITH CONTRAST TECHNIQUE: Multidetector CT imaging of the abdomen and pelvis was performed using the standard protocol following bolus administration of intravenous contrast. RADIATION DOSE REDUCTION: This exam was performed according to the departmental dose-optimization program which includes automated exposure control, adjustment of the mA and/or kV according to patient size and/or use of iterative reconstruction technique. CONTRAST:  31mL OMNIPAQUE IOHEXOL 300 MG/ML  SOLN COMPARISON:  None Available. FINDINGS: Lower  chest: Elevated left hemidiaphragm. Posterior right lower lobe dependent passive atelectasis. Coronary calcifications. Hepatobiliary: No focal liver abnormality. Contracted gallbladder. No gallstones, gallbladder wall thickening, or pericholecystic fluid. No biliary dilatation. Pancreas: No focal lesion. Normal pancreatic contour. No surrounding inflammatory changes. No main pancreatic ductal dilatation. Spleen: Normal in size without focal abnormality.  Splenule noted. Adrenals/Urinary Tract: No adrenal nodule bilaterally. Bilateral kidneys enhance symmetrically. No hydronephrosis. No hydroureter. The urinary bladder is unremarkable. Stomach/Bowel: Stomach is within normal limits. No evidence of bowel wall thickening or dilatation. Scattered colonic diverticulosis. The appendix is not definitely identified with no inflammatory changes in the right lower quadrant to suggest acute appendicitis. Vascular/Lymphatic: No abdominal aorta or iliac aneurysm. Severe atherosclerotic plaque of the aorta and its branches. No abdominal, pelvic, or inguinal lymphadenopathy. Reproductive: The prostate measures mildly enlarged (5 cm). Other: No intraperitoneal free fluid. No intraperitoneal free gas. No organized fluid collection. Musculoskeletal: No abdominal wall hernia or abnormality. No suspicious lytic or blastic osseous lesions. No acute displaced fracture. IMPRESSION: 1. No acute intra-abdominal or intrapelvic abnormality. 2. Scattered colonic diverticulosis with no acute diverticulitis. 3. Prostatomegaly. 4. Aortic Atherosclerosis (ICD10-I70.0) - severe. Electronically Signed   By: Iven Finn M.D.   On: 05/11/2022 18:58   DG Chest Port 1 View  Result Date: 05/11/2022 CLINICAL DATA:  Weakness for a week. EXAM: PORTABLE CHEST 1 VIEW COMPARISON:  May 14, 2021 FINDINGS: Stable cardiomegaly. The hila and mediastinum are unremarkable. No pneumothorax. No nodules or masses. No focal infiltrates. Healed right rib fractures.  Repair of right clavicular fracture. IMPRESSION: No active disease. Electronically Signed   By: Dorise Bullion III M.D.   On: 05/11/2022 18:03    TIMI Risk Score for Unstable Angina or Non-ST Elevation MI:   The patient's TIMI risk score is 4, which indicates a 20% risk of all cause mortality, new or recurrent myocardial infarction or need for urgent revascularization in the next 14 days.{  Assessment and Plan:   Myopericarditis  Mr. Scheible has a very atypical presentation for myo/pericarditis alone as he only had 1 episode of chest discomfort which seems similar to his chronic angina however he was noted to have a small pericardial effusion on bedside echo and has significantly elevated inflammatory markers along with an elevated troponin and new onset atrial fibrillation.  The degree of pain he experienced throughout his abdomen along with diffuse pain throughout his musculature makes me concerned for inflammatory process driving his symptoms.  Imaging is not consistent with diverticulitis, pancreatitis, cholecystitis,  appendicitis.  Lab work shows normal hepatic function and lipase.  If he has persistent pain in his abdomen despite treatment for presumed myopericarditis I would obtain additional imaging of his aorta to evaluate for aortitis with a significantly elevated CRP and diffuse central and left-sided abdominal pain.  I do not think his pain is consistent with a type I NSTEMI but could be consistent with myocardial inflammation as above.  His kidney function is already tenuous so additional contrast to evaluate for aortic pathology would not be pursued.  Additionally treatment for myopericarditis will be colchicine and prednisone as opposed to NSAIDs with his renal function.  Although there is a consideration that he could be acutely infected given his fever, his current infectious work-up is completely negative. Echo ordered to evaluated EF and pericardial effusion.  Additionally he has new onset  AF/RVR that has not previously been documented in his CHA2DS2-VASc is at least 3.  I would not start anticoagulation in the acute hospitalization but would favor either anticoagulation or 30-day monitor to assess for recurrent AF following recovery.  For questions or updates, please contact [place] Please consult [email or web address] for contact info under   Signed, Dion Body, MD  05/12/2022 3:42 AM

## 2022-05-12 NOTE — Assessment & Plan Note (Signed)
   Resuming home regimen of Norvasc as blood pressure tolerates.  Temporarily using intravenous metoprolol instead of home regimen of labetalol in the setting of rapid atrial fibrillation  As needed intravenous antihypertensives for markedly elevated blood pressure.

## 2022-05-12 NOTE — Assessment & Plan Note (Signed)
   Lactic acidosis likely secondary to sepsis or volume depletion  Hydrate patient intravenous isotonic fluids while concurrently treating underlying infection  Monitoring serial lactic acid levels to ensure downtrending and resolution.

## 2022-05-12 NOTE — Assessment & Plan Note (Signed)
·   Please see assessment and plan above °

## 2022-05-12 NOTE — ED Notes (Signed)
Earna CoderMary Faust wife [phone] requesting an update on the patient

## 2022-05-12 NOTE — Assessment & Plan Note (Signed)
   Longstanding history of rheumatoid arthritis on regular leflunomide therapy has placed the patient in an extremely immunocompromised state.  Holding leflunomide for now as we manage a potentially infectious process  Patient will require continued outpatient rheumatology follow-up

## 2022-05-12 NOTE — Assessment & Plan Note (Signed)
Continuing home regimen of daily PPI therapy.  

## 2022-05-12 NOTE — Assessment & Plan Note (Signed)
.   Presence of chronic kidney disease limits NSAID use in the setting of possible pericarditis . Strict intake and output monitoring . Creatinine near baseline . Minimizing nephrotoxic agents as much as possible . Serial chemistries to monitor renal function and electrolytes

## 2022-05-12 NOTE — Progress Notes (Signed)
  Echocardiogram 2D Echocardiogram has been performed.  Delcie Roch 05/12/2022, 9:44 AM

## 2022-05-12 NOTE — Assessment & Plan Note (Signed)
.   Continuing home regimen of statin and beta-blocker therapy . Monitoring on telemetry . Please see comments on elevated troponin above

## 2022-05-12 NOTE — Progress Notes (Signed)
PROGRESS NOTE    Noah Grant  [id number] DOB: [birth year] DOA: 05/11/2022 PCP: [name], DO (Confirm with patient/family/NH records and if not entered, this HAS to be entered at [facility] point of entry. "No PCP" if truly none.)   Chief Complaint  Patient presents with   Weakness    Brief Narrative:    71 year old male with past medical history of coronary artery disease (S/P cath with DES to right PDA branch 08/2020), hypertension, hyperlipidemia, chronic kidney disease stage IIIa (Baseline Cr 1.3-1.5) and rheumatoid arthritis (currently on leflunomide due to numerous drug intolerances), bipolar 1 disorder, benign prostatic hyperplasia, gastroesophageal reflux disease, generalized anxiety disorder, vitamin B12 deficiency presenting to [facility] via EMS due to complaints of weakness and tremors.   Patient explains that for approximate the past week he has developed increasing weakness.  Initially weakness was mild in intensity but progressively became more more severe.  Particularly in the past 3 days his symptoms have become severe, associated with subjective fevers as well as intermittent complaints of chest discomfort.  In the past 24 hours in particular patient reports episodes of violent shaking.  Patient denies any focal extremity weakness, slurred speech, changes in vision, sick contacts, recent travel, shortness of breath, cough or contact with confirmed COVID-19 infection.   Finally the patient also complains of chest pain.  Chest pain is sharp in quality, located in the left chest and left upper quadrant.  Pain is worse with movement or deep inspiration.  Pain seems to resolve when patient leans forward.  Patient states he has never had chest pain like this before.   Of note, patient has a history of rheumatoid arthritis and is receiving leflunomide therapy.   EMS was eventually contacted who promptly came to evaluate patient and brought him into [facility] for evaluation.   Upon evaluation in the emergency department patient was found to have extremely high fevers as high as 103.7 F with concurrent leukocytosis of 15 K concerning for sepsis of undetermined source.  Patient was initiated on broad-spectrum intravenous antibiotics including ceftriaxone and vancomycin.  Patient was given 2 L of normal saline.  Hospitalist group was then called to assess the patient for admission the hospital due to concerns for sepsis.   Assessment & Plan:   Principal Problem:   Sepsis ([place]) Active Problems:   Lactic acidosis   Pericarditis   Elevated troponin level not due myocardial infarction   Rheumatoid arthritis (HCC)   Atrial fibrillation with rapid ventricular response (HCC)   Coronary artery disease involving native coronary artery of native heart   Chronic kidney disease, stage 3a (HCC)   Essential hypertension   Mixed hyperlipidemia   Bipolar 1 disorder, mixed, moderate (HCC)   GERD without esophagitis  SIRS  - Patient presenting with multiple SIRS criteria including tachycardia, extremely high fevers and leukocytosis  - Patient additionally exhibiting evidence of organ dysfunction with markedly elevated troponins and lactic acidosis -This can be related to infectious process, but more likely due to perimyocarditis. -Given his immunocompromise, and he will be on the second dose of steroids, and he is already on leflunomide, will need to cover empirically on broad-spectrum antibiotics, continue with IV vancomycin and cefepime, can narrow antibiotics once more data and cultures are available.   Myopericarditis -Known history of RA. -Significantly elevated inflammatory markers. -No evidence of pericardial effusion on 2D echo. -remains with significant pleuritic and right upper quadrant pain with deep inspiration. -Started on colchicine. -Started on  IV Solu-Medrol. -Holding on NSAIDs specially he received IV contrast with both CT abdomen  pelvis with contrast and CT chest PE protocol.   Lactic acidosis - Lactic acidosis likely secondary to sepsis or volume depletion - Hydrate patient intravenous isotonic fluids while concurrently treating underlying infection - Monitoring serial lactic acid levels to ensure downtrending and resolution.     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  Atrial fibrillation with rapid ventricular response (HCC) - Patient developed rapid atrial fibrillation shortly after arrival to the emergency department. -No anticoagulation with concern of pericarditis   Rheumatoid arthritis ([place]) Longstanding history of rheumatoid arthritis on regular leflunomide therapy has placed the patient in an extremely immunocompromised state. Holding leflunomide for now as we manage a potentially infectious process Patient will require continued outpatient rheumatology follow-up   Coronary artery disease involving native coronary artery of native heart Continuing home regimen of statin and beta-blocker therapy Monitoring on telemetry Please see comments on elevated troponin above     Chronic kidney disease, stage 3a ([place]) Presence of chronic kidney disease limits NSAID use in the setting of possible pericarditis especially he received 2 loads of IV contrast Strict intake and output monitoring Creatinine near baseline Minimizing nephrotoxic agents as much as possible Serial chemistries to monitor renal function and electrolytes     Essential hypertension Resuming home regimen of Norvasc as blood pressure tolerates. Temporarily using intravenous metoprolol instead of home regimen of labetalol in the setting of rapid atrial fibrillation As needed intravenous antihypertensives for markedly elevated blood pressure.   Mixed hyperlipidemia Continuing home regimen of  lipid lowering therapy.     Bipolar 1 disorder, mixed, moderate (HCC) Continue home regimen of lithium Obtain lithium level with morning labs   GERD without esophagitis Continuing home regimen of daily PPI therapy.      DVT prophylaxis: Lovenox Code Status: Full Family Communication: none at bedside Disposition:   Status is: Inpatient    Consultants:  cardiology  Subjective:  he is complaining of left upper quadrant pain with deep inspiration,  Objective: Vitals:   05/12/22 1400 05/12/22 1430 05/12/22 1445 05/12/22 1500  BP: (!) 136/57     Pulse: 66 64 62 62  Resp: (!) 22 20 13 16   Temp:      TempSrc:      SpO2: 92% 92% 95% 96%  Weight:      Height:        Intake/Output Summary (Last 24 hours) at 05/12/2022 1522 Last data filed at 05/12/2022 1409 Gross per 24 hour  Intake 2177.5 ml  Output 900 ml  Net 1277.5 ml   Filed Weights   05/12/22 0751  Weight: 72.6 kg    Examination:  Awake Alert, Oriented X 3, No new F.N deficits, Normal affect, frail, significant discomfort due to left upper quadrant/pleuritic chest pain with deep inspiration Symmetrical Chest wall movement, managed air entry at the bases as he is unable to take deep breath RRR,No Gallops,Rubs or new Murmurs, No Parasternal Heave +ve B.Sounds, Abd Soft, left upper quadrant palpation provoke tenderness and chest area, No rebound - guarding or rigidity. No Cyanosis, Clubbing or edema, No new Rash or bruise      Data Reviewed: I have personally reviewed following labs and imaging studies  CBC: Recent Labs  Lab 05/11/22 1711 05/11/22 1743 05/12/22 0201  WBC 15.0*  --  10.6*  NEUTROABS  --   --  8.9*  HGB 10.4* 10.9* 8.9*  HCT 32.3* 32.0* 27.6*  MCV 93.6  --  93.9  PLT 270  --  [id number]    Basic Metabolic Panel: Recent Labs  Lab 05/11/22 1711 05/11/22 1743 05/12/22 0201  NA 132* 134* 135  K 4.0 4.1 4.1  CL 99 100 107  CO2 19*  --  19*  GLUCOSE 134* 130* 138*  BUN 31* 32* 27*   CREATININE 1.67* 1.70* 1.51*  CALCIUM 8.7*  --  8.1*  MG 2.3  --  2.0    GFR: Estimated Creatinine Clearance: 42.6 mL/min (A) (by C-G formula based on SCr of 1.51 mg/dL (H)).  Liver Function Tests: Recent Labs  Lab 05/11/22 1711 05/12/22 0201  AST 21 16  ALT 17 13  ALKPHOS 49 44  BILITOT 0.5 0.9  PROT 6.7 5.7*  ALBUMIN 3.4* 2.8*    CBG: No results for input(s): GLUCAP in the last 168 hours.   Recent Results (from the past 240 hour(s))  Blood culture (routine x 2)     Status: None (Preliminary result)   Collection Time: 05/11/22  5:22 PM   Specimen: BLOOD  Result Value Ref Range Status   Specimen Description BLOOD RIGHT ANTECUBITAL  Final   Special Requests   Final    BOTTLES DRAWN AEROBIC AND ANAEROBIC Blood Culture adequate volume   Culture   Final    NO GROWTH < 12 HOURS Performed at [facility], [address] [place], [place] [address]    Report Status PENDING  Incomplete  Blood culture (routine x 2)     Status: None (Preliminary result)   Collection Time: 05/11/22  5:29 PM   Specimen: BLOOD LEFT WRIST  Result Value Ref Range Status   Specimen Description BLOOD LEFT WRIST  Final   Special Requests   Final    BOTTLES DRAWN AEROBIC AND ANAEROBIC Blood Culture results may not be optimal due to an inadequate volume of blood received in culture bottles   Culture   Final    NO GROWTH < 12 HOURS Performed at [facility], [place] [address] [place], [place] [address]    Report Status PENDING  Incomplete  Urine Culture     Status: Abnormal   Collection Time: 05/11/22  6:35 PM   Specimen: In/Out Cath Urine  Result Value Ref Range Status   Specimen Description IN/OUT CATH URINE  Final   Special Requests   Final    NONE Performed at [facility], [place] [address], [place], [place] [address]    Culture MULTIPLE SPECIES PRESENT, SUGGEST RECOLLECTION (A)  Final   Report Status 05/12/2022 FINAL  Final  Resp Panel by RT-PCR (Flu A&B, Covid) Anterior  Nasal Swab     Status: None   Collection Time: 05/11/22  6:40 PM   Specimen: Anterior Nasal Swab  Result Value Ref Range Status   SARS Coronavirus 2 by RT PCR NEGATIVE NEGATIVE Final    Comment: (NOTE) SARS-CoV-2 target nucleic acids are NOT DETECTED.  The SARS-CoV-2 RNA is generally detectable in upper respiratory specimens during the acute phase of infection. The lowest concentration of SARS-CoV-2 viral copies this assay can detect is 138 copies/mL. A negative result does not preclude SARS-Cov-2 infection and should not be used as the sole basis for treatment or other patient management decisions. A negative result may occur with  improper specimen collection/handling, submission of specimen other than nasopharyngeal swab, presence of viral mutation(s) within the areas  targeted by this assay, and inadequate number of viral copies(<138 copies/mL). A negative result must be combined with clinical observations, patient history, and epidemiological information. The expected result is Negative.  Fact Sheet for Patients:  EntrepreneurPulse.com.au  Fact Sheet for Healthcare Providers:  IncredibleEmployment.be  This test is no t yet approved or cleared by the [place] FDA and  has been authorized for detection and/or diagnosis of SARS-CoV-2 by FDA under an Emergency Use Authorization (EUA). This EUA will remain  in effect (meaning this test can be used) for the duration of the COVID-19 declaration under Section 564(b)(1) of the Act, 21 U.S.C.section 360bbb-3(b)(1), unless the authorization is terminated  or revoked sooner.       Influenza A by PCR NEGATIVE NEGATIVE Final   Influenza B by PCR NEGATIVE NEGATIVE Final    Comment: (NOTE) The Xpert Xpress SARS-CoV-2/FLU/RSV plus assay is intended as an aid in the diagnosis of influenza from Nasopharyngeal swab specimens and should not be used as a sole basis for treatment. Nasal washings  and aspirates are unacceptable for Xpert Xpress SARS-CoV-2/FLU/RSV testing.  Fact Sheet for Patients: EntrepreneurPulse.com.au  Fact Sheet for Healthcare Providers: IncredibleEmployment.be  This test is not yet approved or cleared by the [place] FDA and has been authorized for detection and/or diagnosis of SARS-CoV-2 by FDA under an Emergency Use Authorization (EUA). This EUA will remain in effect (meaning this test can be used) for the duration of the COVID-19 declaration under Section 564(b)(1) of the Act, 21 U.S.C. section 360bbb-3(b)(1), unless the authorization is terminated or revoked.  Performed at [facility], [place] [address], [place], [place] [address]          Radiology Studies: CT Head Wo Contrast  Addendum Date: 05/11/2022   ADDENDUM REPORT: 05/11/2022 22:14 ADDENDUM: Interval increase in size compared to MRI head 06/30/2019 right scalp 14 x 10 mm cm subcutaneus soft tissue lesion with associated calcifications (new from 2012). Recommend correlation with physical exam. These results were called by telephone at the time of interpretation on 05/11/2022 at 10:14 pm to provider [facility] , who verbally acknowledged these results. Electronically Signed   By: Iven Finn M.D.   On: 05/11/2022 22:14   Result Date: 05/11/2022 CLINICAL DATA:  Neuro deficit, acute, stroke suspected EXAM: CT HEAD WITHOUT CONTRAST TECHNIQUE: Contiguous axial images were obtained from the base of the skull through the vertex without intravenous contrast. RADIATION DOSE REDUCTION: This exam was performed according to the departmental dose-optimization program which includes automated exposure control, adjustment of the mA and/or kV according to patient size and/or use of iterative reconstruction technique. COMPARISON:  MRI head 06/30/2019 BRAIN: BRAIN Cerebral ventricle sizes are concordant with the degree of cerebral volume loss. Patchy and  confluent areas of decreased attenuation are noted throughout the deep and periventricular white matter of the cerebral hemispheres bilaterally, compatible with chronic microvascular ischemic disease. Chronic left centrum semiovale lacunar infarction. Chronic right basal ganglia lacunar infarction. No evidence of large-territorial acute infarction. No parenchymal hemorrhage. No mass lesion. No extra-axial collection. No mass effect or midline shift. No hydrocephalus. Basilar cisterns are patent. Vascular: No hyperdense vessel. Atherosclerotic calcifications are present within the cavernous internal carotid and vertebral arteries. Skull: No acute fracture or focal lesion. Sinuses/Orbits: Paranasal sinuses and mastoid air cells are clear. The orbits are unremarkable. Other: None. IMPRESSION: No acute intracranial abnormality. Electronically Signed: By: Iven Finn M.D. On: 05/11/2022 18:47   CT Angio Chest PE W and/or Wo Contrast  Result  Date: 05/11/2022 CLINICAL DATA:  Pulmonary embolism (PE) suspected, high prob. Generalized weakness. EXAM: CT ANGIOGRAPHY CHEST WITH CONTRAST TECHNIQUE: Multidetector CT imaging of the chest was performed using the standard protocol during bolus administration of intravenous contrast. Multiplanar CT image reconstructions and MIPs were obtained to evaluate the vascular anatomy. RADIATION DOSE REDUCTION: This exam was performed according to the departmental dose-optimization program which includes automated exposure control, adjustment of the mA and/or kV according to patient size and/or use of iterative reconstruction technique. CONTRAST:  50mL OMNIPAQUE IOHEXOL 350 MG/ML SOLN COMPARISON:  CT chest 12/21/2018 FINDINGS: Cardiovascular: Satisfactory opacification of the pulmonary arteries to the segmental level. No evidence of pulmonary embolism. Slightly limited evaluation of the subsegmental level due to motion artifact and atelectasis. The main pulmonary artery is normal in  caliber. Prominent right ventricle. Trace pericardial effusion. The thoracic aorta is normal in caliber. Moderate atherosclerotic plaque of the thoracic aorta. At least 2 vessel coronary artery calcifications. Mediastinum/Nodes: No enlarged mediastinal, hilar, or axillary lymph nodes. Thyroid gland, trachea, and esophagus demonstrate no significant findings. Lungs/Pleura: No focal consolidation. No pulmonary nodule. No pulmonary mass. Trace bilateral, left greater than right, pleural effusions. Associated bilateral lower lobe passive atelectasis. No pneumothorax. Upper Abdomen: No acute abnormality.  Splenule noted. Musculoskeletal: No chest wall abnormality. No suspicious lytic or blastic osseous lesions. No acute displaced fracture. Old healed right rib fractures. Plate and screw fixation of an old healed right clavicular fracture. Review of the MIP images confirms the above findings. IMPRESSION: 1. No pulmonary embolus with slightly limited evaluation of the subsegmental level due to motion artifact and atelectasis. 2. Trace pericardial effusion and trace bilateral pleural effusions, left greater than right. 3. Prominent right cardiac ventricle. 4. Aortic Atherosclerosis (ICD10-I70.0) with at least 2 vessel coronary calcification. Electronically Signed   By: Tish FredericksonMorgane  Naveau M.D.   On: 05/11/2022 23:10   CT ABDOMEN PELVIS W CONTRAST  Result Date: 05/11/2022 CLINICAL DATA:  Abdominal pain, acute, nonlocalized EXAM: CT ABDOMEN AND PELVIS WITH CONTRAST TECHNIQUE: Multidetector CT imaging of the abdomen and pelvis was performed using the standard protocol following bolus administration of intravenous contrast. RADIATION DOSE REDUCTION: This exam was performed according to the departmental dose-optimization program which includes automated exposure control, adjustment of the mA and/or kV according to patient size and/or use of iterative reconstruction technique. CONTRAST:  80mL OMNIPAQUE IOHEXOL 300 MG/ML  SOLN  COMPARISON:  None Available. FINDINGS: Lower chest: Elevated left hemidiaphragm. Posterior right lower lobe dependent passive atelectasis. Coronary calcifications. Hepatobiliary: No focal liver abnormality. Contracted gallbladder. No gallstones, gallbladder wall thickening, or pericholecystic fluid. No biliary dilatation. Pancreas: No focal lesion. Normal pancreatic contour. No surrounding inflammatory changes. No main pancreatic ductal dilatation. Spleen: Normal in size without focal abnormality.  Splenule noted. Adrenals/Urinary Tract: No adrenal nodule bilaterally. Bilateral kidneys enhance symmetrically. No hydronephrosis. No hydroureter. The urinary bladder is unremarkable. Stomach/Bowel: Stomach is within normal limits. No evidence of bowel wall thickening or dilatation. Scattered colonic diverticulosis. The appendix is not definitely identified with no inflammatory changes in the right lower quadrant to suggest acute appendicitis. Vascular/Lymphatic: No abdominal aorta or iliac aneurysm. Severe atherosclerotic plaque of the aorta and its branches. No abdominal, pelvic, or inguinal lymphadenopathy. Reproductive: The prostate measures mildly enlarged (5 cm). Other: No intraperitoneal free fluid. No intraperitoneal free gas. No organized fluid collection. Musculoskeletal: No abdominal wall hernia or abnormality. No suspicious lytic or blastic osseous lesions. No acute displaced fracture. IMPRESSION: 1. No acute intra-abdominal or intrapelvic abnormality. 2. Scattered colonic diverticulosis  with no acute diverticulitis. 3. Prostatomegaly. 4. Aortic Atherosclerosis (ICD10-I70.0) - severe. Electronically Signed   By: Iven Finn M.D.   On: 05/11/2022 18:58   DG Chest Port 1 View  Result Date: 05/11/2022 CLINICAL DATA:  Weakness for a week. EXAM: PORTABLE CHEST 1 VIEW COMPARISON:  May 14, 2021 FINDINGS: Stable cardiomegaly. The hila and mediastinum are unremarkable. No pneumothorax. No nodules or masses. No  focal infiltrates. Healed right rib fractures. Repair of right clavicular fracture. IMPRESSION: No active disease. Electronically Signed   By: Dorise Bullion III M.D.   On: 05/11/2022 18:03   ECHOCARDIOGRAM COMPLETE  Result Date: 05/12/2022    ECHOCARDIOGRAM REPORT   Patient Name:   Noah Grant Date of Exam: 05/12/2022 Medical Rec #:  [id number]       Height:       67.0 in Accession #:    [id number]      Weight:       160.0 lb Date of Birth:  May 29, 1951      BSA:          1.839 m Patient Age:    29 years        BP:           118/67 mmHg Patient Gender: M               HR:           72 bpm. Exam Location:  Inpatient Procedure: 2D Echo Indications:    pericardial effusion  History:        Patient has prior history of Echocardiogram examinations, most                 recent 08/31/2020. CAD, chronic kidney disease,                 Signs/Symptoms:elevated troponin; Risk Factors:Hypertension and                 Dyslipidemia.  Sonographer:    Johny Chess RDCS Referring Phys: [id number] [place]  Sonographer Comments: Patient in severe pain. IMPRESSIONS  1. Left ventricular ejection fraction, by estimation, is 60 to 65%. The left ventricle has normal function. The left ventricle has no regional wall motion abnormalities. Left ventricular diastolic parameters are consistent with Grade II diastolic dysfunction (pseudonormalization). Elevated left atrial pressure.  2. Right ventricular systolic function is normal. The right ventricular size is normal. There is mildly elevated pulmonary artery systolic pressure. The estimated right ventricular systolic pressure is [id number] mmHg.  3. Left atrial size was mildly dilated.  4. The mitral valve is normal in structure. Mild mitral valve regurgitation. No evidence of mitral stenosis.  5. The aortic valve is tricuspid. There is mild calcification of the aortic valve. Aortic valve regurgitation is not visualized. Aortic valve sclerosis is present, with no evidence of aortic  valve stenosis.  6. The inferior vena cava is normal in size with greater than 50% respiratory variability, suggesting right atrial pressure of 3 mmHg. Comparison(s): No significant change from prior study. Prior images reviewed side by side. FINDINGS  Left Ventricle: Left ventricular ejection fraction, by estimation, is 60 to 65%. The left ventricle has normal function. The left ventricle has no regional wall motion abnormalities. The left ventricular internal cavity size was normal in size. There is  no left ventricular hypertrophy. Left ventricular diastolic parameters are consistent with Grade II diastolic dysfunction (pseudonormalization). Elevated left atrial pressure. Right Ventricle: The right ventricular size is normal. No increase  in right ventricular wall thickness. Right ventricular systolic function is normal. There is mildly elevated pulmonary artery systolic pressure. The tricuspid regurgitant velocity is 3.07  m/s, and with an assumed right atrial pressure of 3 mmHg, the estimated right ventricular systolic pressure is [id number] mmHg. Left Atrium: Left atrial size was mildly dilated. Right Atrium: Right atrial size was normal in size. Pericardium: There is no evidence of pericardial effusion. Mitral Valve: The mitral valve is normal in structure. Mild mitral valve regurgitation. No evidence of mitral valve stenosis. Tricuspid Valve: The tricuspid valve is normal in structure. Tricuspid valve regurgitation is trivial. No evidence of tricuspid stenosis. Aortic Valve: The aortic valve is tricuspid. There is mild calcification of the aortic valve. Aortic valve regurgitation is not visualized. Aortic valve sclerosis is present, with no evidence of aortic valve stenosis. Pulmonic Valve: The pulmonic valve was grossly normal. Pulmonic valve regurgitation is not visualized. No evidence of pulmonic stenosis. Aorta: The aortic root and ascending aorta are structurally normal, with no evidence of dilitation. Venous:  The inferior vena cava is normal in size with greater than 50% respiratory variability, suggesting right atrial pressure of 3 mmHg. IAS/Shunts: No atrial level shunt detected by color flow Doppler.  LEFT VENTRICLE PLAX 2D LVIDd:         4.20 cm   Diastology LVIDs:         3.00 cm   LV e' medial:    7.13 cm/s LV PW:         1.00 cm   LV E/e' medial:  17.0 LV IVS:        0.95 cm   LV e' lateral:   8.25 cm/s LVOT diam:     2.20 cm   LV E/e' lateral: 14.7 LV SV:         113 LV SV Index:   61 LVOT Area:     3.80 cm  RIGHT VENTRICLE RV S prime:     15.70 cm/s TAPSE (M-mode): 2.5 cm LEFT ATRIUM             Index        RIGHT ATRIUM           Index LA diam:        4.20 cm 2.28 cm/m   RA Area:     15.80 cm LA Vol (A2C):   60.3 ml 32.78 ml/m  RA Volume:   35.80 ml  19.46 ml/m LA Vol (A4C):   74.1 ml 40.29 ml/m LA Biplane Vol: 72.7 ml 39.53 ml/m  AORTIC VALVE LVOT Vmax:   133.00 cm/s LVOT Vmean:  92.900 cm/s LVOT VTI:    0.296 m  AORTA Ao Root diam: 3.30 cm Ao Asc diam:  3.60 cm MITRAL VALVE                TRICUSPID VALVE MV Area (PHT): 4.31 cm     TR Peak grad:   37.7 mmHg MV Decel Time: 176 msec     TR Vmax:        307.00 cm/s MV E velocity: 121.00 cm/s MV A velocity: 92.70 cm/s   SHUNTS MV E/A ratio:  1.31         Systemic VTI:  0.30 m                             Systemic Diam: 2.20 cm Dani Gobble Croitoru MD Electronically signed by Sanda Klein MD Signature Date/Time: 05/12/2022/1:57:14  PM    Final         Scheduled Meds:  amLODipine  10 mg Oral Daily   clopidogrel  75 mg Oral Daily   colchicine  0.6 mg Oral BID   enoxaparin (LOVENOX) injection  40 mg Subcutaneous Q24H   lithium carbonate  450 mg Oral Daily   losartan  100 mg Oral Daily   methylPREDNISolone (SOLU-MEDROL) injection  125 mg Intravenous Daily   metoprolol tartrate  2.5 mg Intravenous Q6H   pantoprazole  40 mg Oral BID   simvastatin  5 mg Oral Daily   Continuous Infusions:  sodium chloride Stopped (05/12/22 0215)   ceFEPime (MAXIPIME) IV  Stopped (05/12/22 1219)   lactated ringers 125 mL/hr at 05/12/22 0814   metronidazole Stopped (05/12/22 1409)   vancomycin       LOS: 1 day       Phillips Climes, MD [facility]   To contact the attending provider between 7A-7P or the covering provider during after hours 7P-7A, please log into the web site [email or web address] and access using universal [place] password for that web site. If you do not have the password, please call the hospital operator.  05/12/2022, 3:22 PM

## 2022-05-12 NOTE — Assessment & Plan Note (Signed)
   Markedly elevated troponin  Most likely causes for elevated troponin are either possible pericarditis or possibly simply demand ischemia due to underlying infectious/inflammatory process.  Plaque rupture unlikely  Obtaining echocardiogram in the morning  Monitoring patient on telemetry  Remainder of assessment and plan as above

## 2022-05-12 NOTE — ED Notes (Signed)
Awaiting response from Elgergawy MD as patient is moaning in pain, complaining of severe LUQ abd pain. PRN morphine given at 0556 with initial relief however patient now in severe 10/10 pain again.

## 2022-05-13 ENCOUNTER — Inpatient Hospital Stay (HOSPITAL_COMMUNITY): Payer: PPO

## 2022-05-13 ENCOUNTER — Other Ambulatory Visit (HOSPITAL_COMMUNITY): Payer: Self-pay

## 2022-05-13 DIAGNOSIS — I4891 Unspecified atrial fibrillation: Secondary | ICD-10-CM | POA: Diagnosis not present

## 2022-05-13 DIAGNOSIS — R6520 Severe sepsis without septic shock: Secondary | ICD-10-CM | POA: Diagnosis not present

## 2022-05-13 DIAGNOSIS — J90 Pleural effusion, not elsewhere classified: Secondary | ICD-10-CM | POA: Diagnosis not present

## 2022-05-13 DIAGNOSIS — I309 Acute pericarditis, unspecified: Secondary | ICD-10-CM | POA: Diagnosis not present

## 2022-05-13 DIAGNOSIS — N1831 Chronic kidney disease, stage 3a: Secondary | ICD-10-CM | POA: Diagnosis not present

## 2022-05-13 DIAGNOSIS — A419 Sepsis, unspecified organism: Secondary | ICD-10-CM | POA: Diagnosis not present

## 2022-05-13 LAB — CBC
HCT: 34.4 % — ABNORMAL LOW (ref 39.0–52.0)
Hemoglobin: 11.4 g/dL — ABNORMAL LOW (ref 13.0–17.0)
MCH: 30.2 pg (ref 26.0–34.0)
MCHC: 33.1 g/dL (ref 30.0–36.0)
MCV: 91.2 fL (ref 80.0–100.0)
Platelets: 251 10*3/uL (ref 150–400)
RBC: 3.77 MIL/uL — ABNORMAL LOW (ref 4.22–5.81)
RDW: 12.7 % (ref 11.5–15.5)
WBC: 14.6 10*3/uL — ABNORMAL HIGH (ref 4.0–10.5)
nRBC: 0 % (ref 0.0–0.2)

## 2022-05-13 LAB — BASIC METABOLIC PANEL
Anion gap: 10 (ref 5–15)
BUN: 26 mg/dL — ABNORMAL HIGH (ref 8–23)
CO2: 19 mmol/L — ABNORMAL LOW (ref 22–32)
Calcium: 8.6 mg/dL — ABNORMAL LOW (ref 8.9–10.3)
Chloride: 109 mmol/L (ref 98–111)
Creatinine, Ser: 1.29 mg/dL — ABNORMAL HIGH (ref 0.61–1.24)
GFR, Estimated: 60 mL/min — ABNORMAL LOW (ref >60)
Glucose, Bld: 157 mg/dL — ABNORMAL HIGH (ref 70–99)
Potassium: 4.7 mmol/L (ref 3.5–5.1)
Sodium: 138 mmol/L (ref 135–145)

## 2022-05-13 LAB — SEDIMENTATION RATE: Sed Rate: 56 mm/hr — ABNORMAL HIGH (ref 0–16)

## 2022-05-13 LAB — PROCALCITONIN: Procalcitonin: 0.25 ng/mL

## 2022-05-13 LAB — GLUCOSE, CAPILLARY: Glucose-Capillary: 150 mg/dL — ABNORMAL HIGH (ref 70–99)

## 2022-05-13 LAB — C-REACTIVE PROTEIN: CRP: 23.8 mg/dL — ABNORMAL HIGH (ref <1.0)

## 2022-05-13 LAB — HEPARIN LEVEL (UNFRACTIONATED): Heparin Unfractionated: <0.1 IU/mL — ABNORMAL LOW (ref 0.30–0.70)

## 2022-05-13 MED ORDER — LORAZEPAM 2 MG/ML IJ SOLN
0.5000 mg | Freq: Three times a day (TID) | INTRAMUSCULAR | Status: DC | PRN
  Administered 2022-05-13: 0.5 mg via INTRAVENOUS
  Filled 2022-05-13: qty 1

## 2022-05-13 MED ORDER — IBUPROFEN 600 MG PO TABS
600.0000 mg | ORAL_TABLET | Freq: Three times a day (TID) | ORAL | Status: DC
  Administered 2022-05-13: 600 mg via ORAL
  Filled 2022-05-13: qty 1

## 2022-05-13 MED ORDER — HEPARIN BOLUS VIA INFUSION
4000.0000 [IU] | Freq: Once | INTRAVENOUS | Status: AC
  Administered 2022-05-13: 4000 [IU] via INTRAVENOUS
  Filled 2022-05-13: qty 4000

## 2022-05-13 MED ORDER — HEPARIN (PORCINE) 25000 UT/250ML-% IV SOLN
INTRAVENOUS | Status: AC
  Filled 2022-05-13: qty 250

## 2022-05-13 MED ORDER — HYDROMORPHONE HCL 1 MG/ML IJ SOLN
0.5000 mg | Freq: Once | INTRAMUSCULAR | Status: AC | PRN
  Administered 2022-05-13: 0.5 mg via INTRAVENOUS
  Filled 2022-05-13: qty 1

## 2022-05-13 MED ORDER — MORPHINE SULFATE (PF) 2 MG/ML IV SOLN
2.0000 mg | INTRAVENOUS | Status: DC | PRN
  Administered 2022-05-13 (×3): 2 mg via INTRAVENOUS
  Filled 2022-05-13 (×3): qty 1

## 2022-05-13 MED ORDER — KETOROLAC TROMETHAMINE 15 MG/ML IJ SOLN
15.0000 mg | Freq: Once | INTRAMUSCULAR | Status: AC
Start: 2022-05-13 — End: 2022-05-13
  Administered 2022-05-13: 15 mg via INTRAVENOUS
  Filled 2022-05-13: qty 1

## 2022-05-13 MED ORDER — HEPARIN (PORCINE) 25000 UT/250ML-% IV SOLN
1250.0000 [IU]/h | INTRAVENOUS | Status: DC
  Administered 2022-05-13: 1000 [IU]/h via INTRAVENOUS
  Administered 2022-05-14: 1250 [IU]/h via INTRAVENOUS
  Filled 2022-05-13: qty 250

## 2022-05-13 MED ORDER — GADOBUTROL 1 MMOL/ML IV SOLN
10.0000 mL | Freq: Once | INTRAVENOUS | Status: AC | PRN
  Administered 2022-05-13: 10 mL via INTRAVENOUS

## 2022-05-13 MED ORDER — HEPARIN BOLUS VIA INFUSION
2200.0000 [IU] | Freq: Once | INTRAVENOUS | Status: AC
  Administered 2022-05-13: 2200 [IU] via INTRAVENOUS
  Filled 2022-05-13: qty 2200

## 2022-05-13 MED ORDER — PROCHLORPERAZINE EDISYLATE 10 MG/2ML IJ SOLN
10.0000 mg | Freq: Once | INTRAMUSCULAR | Status: AC | PRN
Start: 2022-05-13 — End: 2022-05-13
  Administered 2022-05-13: 10 mg via INTRAVENOUS
  Filled 2022-05-13: qty 2

## 2022-05-13 MED ORDER — ALUM & MAG HYDROXIDE-SIMETH 200-200-20 MG/5ML PO SUSP
30.0000 mL | Freq: Four times a day (QID) | ORAL | Status: DC | PRN
  Administered 2022-05-14: 30 mL via ORAL
  Filled 2022-05-13 (×2): qty 30

## 2022-05-13 NOTE — Progress Notes (Signed)
PROGRESS NOTE    Noah Grant  [id number] DOB: July 03, 1951 DOA: 05/11/2022 PCP: [name], DO    Chief Complaint  Patient presents with   Weakness    Brief Narrative:    71 year old male with past medical history of coronary artery disease (S/P cath with DES to right PDA branch 08/2020), hypertension, hyperlipidemia, chronic kidney disease stage IIIa (Baseline Cr 1.3-1.5) and rheumatoid arthritis (currently on leflunomide due to numerous drug intolerances), bipolar 1 disorder, benign prostatic hyperplasia, gastroesophageal reflux disease, generalized anxiety disorder, vitamin B12 deficiency presenting to [facility] via EMS due to complaints of weakness and tremors.   Patient explains that for approximate the past week he has developed increasing weakness.  Initially weakness was mild in intensity but progressively became more more severe.  Particularly in the past 3 days his symptoms have become severe, associated with subjective fevers as well as intermittent complaints of chest discomfort.  In the past 24 hours in particular patient reports episodes of violent shaking.  Patient denies any focal extremity weakness, slurred speech, changes in vision, sick contacts, recent travel, shortness of breath, cough or contact with confirmed COVID-19 infection.   Finally the patient also complains of chest pain.  Chest pain is sharp in quality, located in the left chest and left upper quadrant.  Pain is worse with movement or deep inspiration.  Pain seems to resolve when patient leans forward.  Patient states he has never had chest pain like this before.   Of note, patient has a history of rheumatoid arthritis and is receiving leflunomide therapy.   EMS was eventually contacted who promptly came to evaluate patient and brought him into [facility] for evaluation.   Upon evaluation in the emergency department patient was found to have extremely high fevers as high as  103.7 F with concurrent leukocytosis of 15 K concerning for sepsis of undetermined source.  Patient was initiated on broad-spectrum intravenous antibiotics including ceftriaxone and vancomycin.  Patient was given 2 L of normal saline.  Hospitalist group was then called to assess the patient for admission the hospital due to concerns for sepsis.   Assessment & Plan:   Principal Problem:   Sepsis ([place]) Active Problems:   Lactic acidosis   Pericarditis   Elevated troponin level not due myocardial infarction   Rheumatoid arthritis (HCC)   Atrial fibrillation with rapid ventricular response (HCC)   Coronary artery disease involving native coronary artery of native heart   Chronic kidney disease, stage 3a (HCC)   Essential hypertension   Mixed hyperlipidemia   Bipolar 1 disorder, mixed, moderate (HCC)   GERD without esophagitis  SIRS  - Patient presenting with multiple SIRS criteria including tachycardia, extremely high fevers and leukocytosis  - Patient additionally exhibiting evidence of organ dysfunction with markedly elevated troponins and lactic acidosis -This can be related to infectious process, but more likely due to perimyocarditis. -Given his immunocompromise, and he will be on the second dose of steroids, and he is already on leflunomide, will need to cover empirically on broad-spectrum antibiotics, continue with IV vancomycin and cefepime, escalate antibiotics when more stable and more culture data available.  Myopericarditis -Known history of RA. -Significantly elevated inflammatory markers.  CRP trending up at 23 today, continue to monitor -No evidence of pericardial effusion on 2D echo. -remains with significant pleuritic and right upper quadrant pain with deep inspiration. -Started on colchicine. -Started on IV Solu-Medrol. -He was started on NSAIDs today given significant pleuritic chest pain  despite colchicine and IV steroids, as well his CRP is trending up, but I have  discontinued later in the day now he is on heparin GTT for A-fib.   -Cardiac MRI is pending.     Lactic acidosis - Lactic acidosis likely secondary to sepsis or volume depletion - Hydrate patient intravenous isotonic fluids while concurrently treating underlying infection - Monitoring serial lactic acid levels to ensure downtrending and resolution.     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  Atrial fibrillation with rapid ventricular response (HCC) - Patient developed rapid atrial fibrillation shortly after arrival to the emergency department. -No pleural effusion on echo, heparin GTT   Rheumatoid arthritis ([name]) Longstanding history of rheumatoid arthritis on regular leflunomide therapy has placed the patient in an extremely immunocompromised state. Holding leflunomide for now as we manage a potentially infectious process Patient will require continued outpatient rheumatology follow-up   Coronary artery disease involving native coronary artery of native heart Continuing home regimen of statin and beta-blocker therapy Monitoring on telemetry Please see comments on elevated troponin above     Chronic kidney disease, stage 3a ([name]) Presence of chronic kidney disease limits NSAID use in the setting of possible pericarditis especially he received 2 loads of IV contrast Strict intake and output monitoring Creatinine near baseline Minimizing nephrotoxic agents as much as possible Serial chemistries to monitor renal function and electrolytes     Essential hypertension Resuming home regimen of Norvasc as blood pressure tolerates. Temporarily using intravenous metoprolol instead of home regimen of labetalol in the setting of rapid atrial fibrillation As needed intravenous antihypertensives for markedly elevated blood pressure.   Mixed  hyperlipidemia Continuing home regimen of lipid lowering therapy.     Bipolar 1 disorder, mixed, moderate (HCC) Continue home regimen of lithium Obtain lithium level with morning labs   GERD without esophagitis Continuing home regimen of daily PPI therapy.      DVT prophylaxis: Lovenox Code Status: Full Family Communication: none at bedside Disposition:   Status is: Inpatient    Consultants:  cardiology  Subjective:  Patient is still complaining of left-sided pleuritic chest pain, and left upper quadrant chest pain with deep inspiration.  Objective: Vitals:   05/13/22 0534 05/13/22 0700 05/13/22 0745 05/13/22 1328  BP: (!) 176/81   (!) 168/71  Pulse: 65 64 86   Resp:   (!) 35   Temp:      TempSrc:      SpO2: 96% 98% 92% 94%  Weight:      Height:        Intake/Output Summary (Last 24 hours) at 05/13/2022 1407 Last data filed at 05/13/2022 0900 Gross per 24 hour  Intake 1483.24 ml  Output 900 ml  Net 583.24 ml   Filed Weights   05/12/22 0751 05/12/22 1809  Weight: 72.6 kg 68.1 kg    Examination:  Awake Alert, Oriented X 3, he remains in significant discomfort due to left-sided pleuritic chest pain Symmetrical Chest wall movement, Good air movement bilaterally, CTAB RRR,No Gallops,Rubs or new Murmurs, No Parasternal Heave +ve B.Sounds, Abd Soft, No tenderness, No rebound - guarding or rigidity. No Cyanosis, Clubbing or edema, No new Rash or bruise       Data Reviewed: I have personally reviewed following labs and imaging studies  CBC: Recent Labs  Lab 05/11/22 1711 05/11/22 1743 05/12/22 0201 05/13/22 0151  WBC 15.0*  --  10.6*  14.6*  NEUTROABS  --   --  8.9*  --   HGB 10.4* 10.9* 8.9* 11.4*  HCT 32.3* 32.0* 27.6* 34.4*  MCV 93.6  --  93.9 91.2  PLT 270  --  177 [id number]    Basic Metabolic Panel: Recent Labs  Lab 05/11/22 1711 05/11/22 1743 05/12/22 0201 05/13/22 0151  NA 132* 134* 135 138  K 4.0 4.1 4.1 4.7  CL 99 100 107 109  CO2 19*   --  19* 19*  GLUCOSE 134* 130* 138* 157*  BUN 31* 32* 27* 26*  CREATININE 1.67* 1.70* 1.51* 1.29*  CALCIUM 8.7*  --  8.1* 8.6*  MG 2.3  --  2.0  --     GFR: Estimated Creatinine Clearance: 49.8 mL/min (A) (by C-G formula based on SCr of 1.29 mg/dL (H)).  Liver Function Tests: Recent Labs  Lab 05/11/22 1711 05/12/22 0201  AST 21 16  ALT 17 13  ALKPHOS 49 44  BILITOT 0.5 0.9  PROT 6.7 5.7*  ALBUMIN 3.4* 2.8*    CBG: Recent Labs  Lab 05/11/22 1702  GLUCAP 150*     Recent Results (from the past 240 hour(s))  Blood culture (routine x 2)     Status: None (Preliminary result)   Collection Time: 05/11/22  5:22 PM   Specimen: BLOOD  Result Value Ref Range Status   Specimen Description BLOOD RIGHT ANTECUBITAL  Final   Special Requests   Final    BOTTLES DRAWN AEROBIC AND ANAEROBIC Blood Culture adequate volume   Culture   Final    NO GROWTH 2 DAYS Performed at [facility], [place] [address], [place], [place] [address]    Report Status PENDING  Incomplete  Blood culture (routine x 2)     Status: None (Preliminary result)   Collection Time: 05/11/22  5:29 PM   Specimen: BLOOD LEFT WRIST  Result Value Ref Range Status   Specimen Description BLOOD LEFT WRIST  Final   Special Requests   Final    BOTTLES DRAWN AEROBIC AND ANAEROBIC Blood Culture results may not be optimal due to an inadequate volume of blood received in culture bottles   Culture   Final    NO GROWTH 2 DAYS Performed at [facility], [place] [address] [place], [place] [address]    Report Status PENDING  Incomplete  Urine Culture     Status: Abnormal   Collection Time: 05/11/22  6:35 PM   Specimen: In/Out Cath Urine  Result Value Ref Range Status   Specimen Description IN/OUT CATH URINE  Final   Special Requests   Final    NONE Performed at [facility], [place] [address] [place], [place] [address]    Culture MULTIPLE SPECIES PRESENT, SUGGEST RECOLLECTION (A)  Final   Report Status  05/12/2022 FINAL  Final  Resp Panel by RT-PCR (Flu A&B, Covid) Anterior Nasal Swab     Status: None   Collection Time: 05/11/22  6:40 PM   Specimen: Anterior Nasal Swab  Result Value Ref Range Status   SARS Coronavirus 2 by RT PCR NEGATIVE NEGATIVE Final    Comment: (NOTE) SARS-CoV-2 target nucleic acids are NOT DETECTED.  The SARS-CoV-2 RNA is generally detectable in upper respiratory specimens during the acute phase of infection. The lowest concentration of SARS-CoV-2 viral copies this assay can detect is 138 copies/mL. A negative result does not preclude SARS-Cov-2 infection and should not be used as the sole basis for treatment or  other patient management decisions. A negative result may occur with  improper specimen collection/handling, submission of specimen other than nasopharyngeal swab, presence of viral mutation(s) within the areas targeted by this assay, and inadequate number of viral copies(<138 copies/mL). A negative result must be combined with clinical observations, patient history, and epidemiological information. The expected result is Negative.  Fact Sheet for Patients:  EntrepreneurPulse.com.au  Fact Sheet for Healthcare Providers:  IncredibleEmployment.be  This test is no t yet approved or cleared by the [place] FDA and  has been authorized for detection and/or diagnosis of SARS-CoV-2 by FDA under an Emergency Use Authorization (EUA). This EUA will remain  in effect (meaning this test can be used) for the duration of the COVID-19 declaration under Section 564(b)(1) of the Act, 21 U.S.C.section 360bbb-3(b)(1), unless the authorization is terminated  or revoked sooner.       Influenza A by PCR NEGATIVE NEGATIVE Final   Influenza B by PCR NEGATIVE NEGATIVE Final    Comment: (NOTE) The Xpert Xpress SARS-CoV-2/FLU/RSV plus assay is intended as an aid in the diagnosis of influenza from Nasopharyngeal swab specimens  and should not be used as a sole basis for treatment. Nasal washings and aspirates are unacceptable for Xpert Xpress SARS-CoV-2/FLU/RSV testing.  Fact Sheet for Patients: EntrepreneurPulse.com.au  Fact Sheet for Healthcare Providers: IncredibleEmployment.be  This test is not yet approved or cleared by the [place] FDA and has been authorized for detection and/or diagnosis of SARS-CoV-2 by FDA under an Emergency Use Authorization (EUA). This EUA will remain in effect (meaning this test can be used) for the duration of the COVID-19 declaration under Section 564(b)(1) of the Act, 21 U.S.C. section 360bbb-3(b)(1), unless the authorization is terminated or revoked.  Performed at [facility], [place] [address], [place], [place] [address]          Radiology Studies: CT Head Wo Contrast  Addendum Date: 05/11/2022   ADDENDUM REPORT: 05/11/2022 22:14 ADDENDUM: Interval increase in size compared to MRI head 06/30/2019 right scalp 14 x 10 mm cm subcutaneus soft tissue lesion with associated calcifications (new from 2012). Recommend correlation with physical exam. These results were called by telephone at the time of interpretation on 05/11/2022 at 10:14 pm to provider [facility] , who verbally acknowledged these results. Electronically Signed   By: Iven Finn M.D.   On: 05/11/2022 22:14   Result Date: 05/11/2022 CLINICAL DATA:  Neuro deficit, acute, stroke suspected EXAM: CT HEAD WITHOUT CONTRAST TECHNIQUE: Contiguous axial images were obtained from the base of the skull through the vertex without intravenous contrast. RADIATION DOSE REDUCTION: This exam was performed according to the departmental dose-optimization program which includes automated exposure control, adjustment of the mA and/or kV according to patient size and/or use of iterative reconstruction technique. COMPARISON:  MRI head 06/30/2019 BRAIN: BRAIN Cerebral ventricle sizes are  concordant with the degree of cerebral volume loss. Patchy and confluent areas of decreased attenuation are noted throughout the deep and periventricular white matter of the cerebral hemispheres bilaterally, compatible with chronic microvascular ischemic disease. Chronic left centrum semiovale lacunar infarction. Chronic right basal ganglia lacunar infarction. No evidence of large-territorial acute infarction. No parenchymal hemorrhage. No mass lesion. No extra-axial collection. No mass effect or midline shift. No hydrocephalus. Basilar cisterns are patent. Vascular: No hyperdense vessel. Atherosclerotic calcifications are present within the cavernous internal carotid and vertebral arteries. Skull: No acute fracture or focal lesion. Sinuses/Orbits: Paranasal sinuses and mastoid air cells are clear. The orbits are unremarkable. Other:  None. IMPRESSION: No acute intracranial abnormality. Electronically Signed: By: Iven Finn M.D. On: 05/11/2022 18:47   CT Angio Chest PE W and/or Wo Contrast  Result Date: 05/11/2022 CLINICAL DATA:  Pulmonary embolism (PE) suspected, high prob. Generalized weakness. EXAM: CT ANGIOGRAPHY CHEST WITH CONTRAST TECHNIQUE: Multidetector CT imaging of the chest was performed using the standard protocol during bolus administration of intravenous contrast. Multiplanar CT image reconstructions and MIPs were obtained to evaluate the vascular anatomy. RADIATION DOSE REDUCTION: This exam was performed according to the departmental dose-optimization program which includes automated exposure control, adjustment of the mA and/or kV according to patient size and/or use of iterative reconstruction technique. CONTRAST:  63mL OMNIPAQUE IOHEXOL 350 MG/ML SOLN COMPARISON:  CT chest 12/21/2018 FINDINGS: Cardiovascular: Satisfactory opacification of the pulmonary arteries to the segmental level. No evidence of pulmonary embolism. Slightly limited evaluation of the subsegmental level due to motion  artifact and atelectasis. The main pulmonary artery is normal in caliber. Prominent right ventricle. Trace pericardial effusion. The thoracic aorta is normal in caliber. Moderate atherosclerotic plaque of the thoracic aorta. At least 2 vessel coronary artery calcifications. Mediastinum/Nodes: No enlarged mediastinal, hilar, or axillary lymph nodes. Thyroid gland, trachea, and esophagus demonstrate no significant findings. Lungs/Pleura: No focal consolidation. No pulmonary nodule. No pulmonary mass. Trace bilateral, left greater than right, pleural effusions. Associated bilateral lower lobe passive atelectasis. No pneumothorax. Upper Abdomen: No acute abnormality.  Splenule noted. Musculoskeletal: No chest wall abnormality. No suspicious lytic or blastic osseous lesions. No acute displaced fracture. Old healed right rib fractures. Plate and screw fixation of an old healed right clavicular fracture. Review of the MIP images confirms the above findings. IMPRESSION: 1. No pulmonary embolus with slightly limited evaluation of the subsegmental level due to motion artifact and atelectasis. 2. Trace pericardial effusion and trace bilateral pleural effusions, left greater than right. 3. Prominent right cardiac ventricle. 4. Aortic Atherosclerosis (ICD10-I70.0) with at least 2 vessel coronary calcification. Electronically Signed   By: Iven Finn M.D.   On: 05/11/2022 23:10   CT ABDOMEN PELVIS W CONTRAST  Result Date: 05/11/2022 CLINICAL DATA:  Abdominal pain, acute, nonlocalized EXAM: CT ABDOMEN AND PELVIS WITH CONTRAST TECHNIQUE: Multidetector CT imaging of the abdomen and pelvis was performed using the standard protocol following bolus administration of intravenous contrast. RADIATION DOSE REDUCTION: This exam was performed according to the departmental dose-optimization program which includes automated exposure control, adjustment of the mA and/or kV according to patient size and/or use of iterative reconstruction  technique. CONTRAST:  98mL OMNIPAQUE IOHEXOL 300 MG/ML  SOLN COMPARISON:  None Available. FINDINGS: Lower chest: Elevated left hemidiaphragm. Posterior right lower lobe dependent passive atelectasis. Coronary calcifications. Hepatobiliary: No focal liver abnormality. Contracted gallbladder. No gallstones, gallbladder wall thickening, or pericholecystic fluid. No biliary dilatation. Pancreas: No focal lesion. Normal pancreatic contour. No surrounding inflammatory changes. No main pancreatic ductal dilatation. Spleen: Normal in size without focal abnormality.  Splenule noted. Adrenals/Urinary Tract: No adrenal nodule bilaterally. Bilateral kidneys enhance symmetrically. No hydronephrosis. No hydroureter. The urinary bladder is unremarkable. Stomach/Bowel: Stomach is within normal limits. No evidence of bowel wall thickening or dilatation. Scattered colonic diverticulosis. The appendix is not definitely identified with no inflammatory changes in the right lower quadrant to suggest acute appendicitis. Vascular/Lymphatic: No abdominal aorta or iliac aneurysm. Severe atherosclerotic plaque of the aorta and its branches. No abdominal, pelvic, or inguinal lymphadenopathy. Reproductive: The prostate measures mildly enlarged (5 cm). Other: No intraperitoneal free fluid. No intraperitoneal free gas. No organized fluid collection. Musculoskeletal: No  abdominal wall hernia or abnormality. No suspicious lytic or blastic osseous lesions. No acute displaced fracture. IMPRESSION: 1. No acute intra-abdominal or intrapelvic abnormality. 2. Scattered colonic diverticulosis with no acute diverticulitis. 3. Prostatomegaly. 4. Aortic Atherosclerosis (ICD10-I70.0) - severe. Electronically Signed   By: Iven Finn M.D.   On: 05/11/2022 18:58   DG Chest Port 1 View  Result Date: 05/11/2022 CLINICAL DATA:  Weakness for a week. EXAM: PORTABLE CHEST 1 VIEW COMPARISON:  May 14, 2021 FINDINGS: Stable cardiomegaly. The hila and mediastinum  are unremarkable. No pneumothorax. No nodules or masses. No focal infiltrates. Healed right rib fractures. Repair of right clavicular fracture. IMPRESSION: No active disease. Electronically Signed   By: Dorise Bullion III M.D.   On: 05/11/2022 18:03   ECHOCARDIOGRAM COMPLETE  Result Date: 05/12/2022    ECHOCARDIOGRAM REPORT   Patient Name:   Noah Grant Date of Exam: 05/12/2022 Medical Rec #:  [id number]       Height:       67.0 in Accession #:    [id number]      Weight:       160.0 lb Date of Birth:  1951/01/20      BSA:          1.839 m Patient Age:    67 years        BP:           118/67 mmHg Patient Gender: M               HR:           72 bpm. Exam Location:  Inpatient Procedure: 2D Echo Indications:    pericardial effusion  History:        Patient has prior history of Echocardiogram examinations, most                 recent 08/31/2020. CAD, chronic kidney disease,                 Signs/Symptoms:elevated troponin; Risk Factors:Hypertension and                 Dyslipidemia.  Sonographer:    Johny Chess RDCS Referring Phys: [id number] [place]  Sonographer Comments: Patient in severe pain. IMPRESSIONS  1. Left ventricular ejection fraction, by estimation, is 60 to 65%. The left ventricle has normal function. The left ventricle has no regional wall motion abnormalities. Left ventricular diastolic parameters are consistent with Grade II diastolic dysfunction (pseudonormalization). Elevated left atrial pressure.  2. Right ventricular systolic function is normal. The right ventricular size is normal. There is mildly elevated pulmonary artery systolic pressure. The estimated right ventricular systolic pressure is [id number] mmHg.  3. Left atrial size was mildly dilated.  4. The mitral valve is normal in structure. Mild mitral valve regurgitation. No evidence of mitral stenosis.  5. The aortic valve is tricuspid. There is mild calcification of the aortic valve. Aortic valve regurgitation is not visualized.  Aortic valve sclerosis is present, with no evidence of aortic valve stenosis.  6. The inferior vena cava is normal in size with greater than 50% respiratory variability, suggesting right atrial pressure of 3 mmHg. Comparison(s): No significant change from prior study. Prior images reviewed side by side. FINDINGS  Left Ventricle: Left ventricular ejection fraction, by estimation, is 60 to 65%. The left ventricle has normal function. The left ventricle has no regional wall motion abnormalities. The left ventricular internal cavity size was normal in size. There is  no left  ventricular hypertrophy. Left ventricular diastolic parameters are consistent with Grade II diastolic dysfunction (pseudonormalization). Elevated left atrial pressure. Right Ventricle: The right ventricular size is normal. No increase in right ventricular wall thickness. Right ventricular systolic function is normal. There is mildly elevated pulmonary artery systolic pressure. The tricuspid regurgitant velocity is 3.07  m/s, and with an assumed right atrial pressure of 3 mmHg, the estimated right ventricular systolic pressure is [id number] mmHg. Left Atrium: Left atrial size was mildly dilated. Right Atrium: Right atrial size was normal in size. Pericardium: There is no evidence of pericardial effusion. Mitral Valve: The mitral valve is normal in structure. Mild mitral valve regurgitation. No evidence of mitral valve stenosis. Tricuspid Valve: The tricuspid valve is normal in structure. Tricuspid valve regurgitation is trivial. No evidence of tricuspid stenosis. Aortic Valve: The aortic valve is tricuspid. There is mild calcification of the aortic valve. Aortic valve regurgitation is not visualized. Aortic valve sclerosis is present, with no evidence of aortic valve stenosis. Pulmonic Valve: The pulmonic valve was grossly normal. Pulmonic valve regurgitation is not visualized. No evidence of pulmonic stenosis. Aorta: The aortic root and ascending aorta  are structurally normal, with no evidence of dilitation. Venous: The inferior vena cava is normal in size with greater than 50% respiratory variability, suggesting right atrial pressure of 3 mmHg. IAS/Shunts: No atrial level shunt detected by color flow Doppler.  LEFT VENTRICLE PLAX 2D LVIDd:         4.20 cm   Diastology LVIDs:         3.00 cm   LV e' medial:    7.13 cm/s LV PW:         1.00 cm   LV E/e' medial:  17.0 LV IVS:        0.95 cm   LV e' lateral:   8.25 cm/s LVOT diam:     2.20 cm   LV E/e' lateral: 14.7 LV SV:         113 LV SV Index:   61 LVOT Area:     3.80 cm  RIGHT VENTRICLE RV S prime:     15.70 cm/s TAPSE (M-mode): 2.5 cm LEFT ATRIUM             Index        RIGHT ATRIUM           Index LA diam:        4.20 cm 2.28 cm/m   RA Area:     15.80 cm LA Vol (A2C):   60.3 ml 32.78 ml/m  RA Volume:   35.80 ml  19.46 ml/m LA Vol (A4C):   74.1 ml 40.29 ml/m LA Biplane Vol: 72.7 ml 39.53 ml/m  AORTIC VALVE LVOT Vmax:   133.00 cm/s LVOT Vmean:  92.900 cm/s LVOT VTI:    0.296 m  AORTA Ao Root diam: 3.30 cm Ao Asc diam:  3.60 cm MITRAL VALVE                TRICUSPID VALVE MV Area (PHT): 4.31 cm     TR Peak grad:   37.7 mmHg MV Decel Time: 176 msec     TR Vmax:        307.00 cm/s MV E velocity: 121.00 cm/s MV A velocity: 92.70 cm/s   SHUNTS MV E/A ratio:  1.31         Systemic VTI:  0.30 m  Systemic Diam: 2.20 cm Mihai Croitoru MD Electronically signed by Sanda Klein MD Signature Date/Time: 05/12/2022/1:57:14 PM    Final         Scheduled Meds:  amLODipine  10 mg Oral Daily   colchicine  0.6 mg Oral BID   lithium carbonate  450 mg Oral Daily   losartan  100 mg Oral Daily   methylPREDNISolone (SOLU-MEDROL) injection  125 mg Intravenous Daily   metoprolol tartrate  2.5 mg Intravenous Q6H   pantoprazole  40 mg Oral BID   simvastatin  5 mg Oral Daily   Continuous Infusions:  sodium chloride Stopped (05/12/22 0215)   ceFEPime (MAXIPIME) IV 2 g (05/13/22 1333)    heparin     heparin 1,000 Units/hr (05/13/22 1134)   lactated ringers 50 mL/hr at 05/12/22 1844   metronidazole 500 mg (05/13/22 0145)   vancomycin 1,000 mg (05/12/22 2301)     LOS: 2 days       Phillips Climes, MD [facility]   To contact the attending provider between 7A-7P or the covering provider during after hours 7P-7A, please log into the web site [email or web address] and access using universal [place] password for that web site. If you do not have the password, please call the hospital operator.  05/13/2022, 2:07 PM

## 2022-05-13 NOTE — Progress Notes (Signed)
PT Cancellation Note  Patient Details Name: Noah PerchesRonald R Delcid MRN: [id number] DOB: [birth year]   Cancelled Treatment:    Reason Eval/Treat Not Completed: Patient at procedure or test/unavailable   Jermiah Howton B Ellamay Fors 05/13/2022, 10:07 AM Merryl HackerMaija P, PT Acute Rehabilitation Services Pager: [phone] Office: [phone]

## 2022-05-13 NOTE — Progress Notes (Signed)
Pt had been having pleuritic CP since beginning of shift. Chrissy, RN passed morphine at [facility]. Morphine did not seem to help the pain - pt still reporting 8/10, especially when breathing. I paged Dr. Antionette Charpyd and he ordered a one time dose of 0.5 mg Dilaudid. Med was given and pt fell asleep for a little while. Around 2210 (30 min after Dilaudid) pt woke up screaming in pain. I administered 3x Nitroglycerin SL and another dose of morphine around 2230. Got an EKG and pt was in Afib. Pt pain seemed to be improving and he has been able to rest on and off since that time. Paged Dr. Cherly Beacharlisle to pass on this information. Will continue to monitor pt.

## 2022-05-13 NOTE — Progress Notes (Signed)
Pt converted back to NSR around 0330.

## 2022-05-13 NOTE — TOC Benefit Eligibility Note (Signed)
Patient Advocate Encounter  Insurance verification completed.    The patient is currently admitted and upon discharge could be taking Eliquis 5 mg.  The current 30 day co-pay is, $45.00.   The patient is insured through [employer] Medicare Part D     Tyreek Clabo, CPhT Pharmacy Patient Advocate Specialist [facility] Patient Advocate Team Direct Number: [phone]  Fax: [phone]        

## 2022-05-13 NOTE — Progress Notes (Addendum)
Progress Note  Patient Name: Noah Grant Date of Encounter: 05/13/2022  [facility] Cardiologist: Jenean Lindau, MD   Subjective   Flushed, uncomfortable, having persistent pleuritic chest pain. Very uncomfortable.  Blood cultures NG. Crp/ESR elevated. CT abd/pelvis did not show any acute issues. [name] +CAC 2 vessel disease.   He developed atrial fibrillation overnight  Inpatient Medications    Scheduled Meds:  amLODipine  10 mg Oral Daily   clopidogrel  75 mg Oral Daily   colchicine  0.6 mg Oral BID   enoxaparin (LOVENOX) injection  40 mg Subcutaneous Q24H   ibuprofen  600 mg Oral TID   lithium carbonate  450 mg Oral Daily   losartan  100 mg Oral Daily   methylPREDNISolone (SOLU-MEDROL) injection  125 mg Intravenous Daily   metoprolol tartrate  2.5 mg Intravenous Q6H   pantoprazole  40 mg Oral BID   simvastatin  5 mg Oral Daily   Continuous Infusions:  sodium chloride Stopped (05/12/22 0215)   ceFEPime (MAXIPIME) IV 2 g (05/13/22 0025)   lactated ringers 50 mL/hr at 05/12/22 1844   metronidazole 500 mg (05/13/22 0145)   vancomycin 1,000 mg (05/12/22 2301)   PRN Meds: sodium chloride, acetaminophen **OR** acetaminophen, hydrALAZINE, LORazepam, morphine injection, nitroGLYCERIN, ondansetron **OR** ondansetron (ZOFRAN) IV, polyethylene glycol   Vital Signs    Vitals:   05/13/22 0434 05/13/22 0534 05/13/22 0700 05/13/22 0745  BP: 140/85 (!) 176/81    Pulse: 71 65 64 86  Resp: 19   (!) 35  Temp: 98 F (36.7 C)     TempSrc: Oral     SpO2: 97% 96% 98% 92%  Weight:      Height:        Intake/Output Summary (Last 24 hours) at 05/13/2022 0814 Last data filed at 05/13/2022 0442 Gross per 24 hour  Intake 1584.26 ml  Output 800 ml  Net 784.26 ml      05/12/2022    6:09 PM 05/12/2022    7:51 AM 04/19/2022    9:30 AM  Last 3 Weights  Weight (lbs) 150 lb 3.2 oz 160 lb 161 lb  Weight (kg) 68.13 kg 72.576 kg 73.029 kg      Telemetry    Afib yesterday evening,  converted to sinus- Personally Reviewed  ECG    NA - Personally Reviewed  Physical Exam   Vitals:   05/13/22 0700 05/13/22 0745  BP:    Pulse: 64 86  Resp:  (!) 35  Temp:    SpO2: 98% 92%    GEN: flushed, acute  distress  Neck: No JVD Cardiac: RRR, no murmurs, rubs, or gallops.  Respiratory: Clear to auscultation bilaterally. GI: Soft, nontender, non-distended  MS: No edema; No deformity. Neuro:  Nonfocal  Psych: Normal affect   Labs    High Sensitivity Troponin:   Recent Labs  Lab 05/11/22 1711 05/11/22 1911 05/12/22 0114  TROPONINIHS 332* 441* 330*     Chemistry Recent Labs  Lab 05/11/22 1711 05/11/22 1743 05/12/22 0201 05/13/22 0151  NA 132* 134* 135 138  K 4.0 4.1 4.1 4.7  CL 99 100 107 109  CO2 19*  --  19* 19*  GLUCOSE 134* 130* 138* 157*  BUN 31* 32* 27* 26*  CREATININE 1.67* 1.70* 1.51* 1.29*  CALCIUM 8.7*  --  8.1* 8.6*  MG 2.3  --  2.0  --   PROT 6.7  --  5.7*  --   ALBUMIN 3.4*  --  2.8*  --  AST 21  --  16  --   ALT 17  --  13  --   ALKPHOS 49  --  44  --   BILITOT 0.5  --  0.9  --   GFRNONAA 44*  --  49* 60*  ANIONGAP 14  --  9 10    Lipids No results for input(s): CHOL, TRIG, HDL, LABVLDL, LDLCALC, CHOLHDL in the last 168 hours.  Hematology Recent Labs  Lab 05/11/22 1711 05/11/22 1743 05/12/22 0201 05/13/22 0151  WBC 15.0*  --  10.6* 14.6*  RBC 3.45*  --  2.94* 3.77*  HGB 10.4* 10.9* 8.9* 11.4*  HCT 32.3* 32.0* 27.6* 34.4*  MCV 93.6  --  93.9 91.2  MCH 30.1  --  30.3 30.2  MCHC 32.2  --  32.2 33.1  RDW 12.7  --  12.6 12.7  PLT 270  --  177 251   Thyroid No results for input(s): TSH, FREET4 in the last 168 hours.  BNPNo results for input(s): BNP, PROBNP in the last 168 hours.  DDimer No results for input(s): DDIMER in the last 168 hours.   Radiology    CT Head Wo Contrast  Addendum Date: 05/11/2022   ADDENDUM REPORT: 05/11/2022 22:14 ADDENDUM: Interval increase in size compared to MRI head 06/30/2019 right scalp 14 x  10 mm cm subcutaneus soft tissue lesion with associated calcifications (new from 2012). Recommend correlation with physical exam. These results were called by telephone at the time of interpretation on 05/11/2022 at 10:14 pm to provider [facility] , who verbally acknowledged these results. Electronically Signed   By: Iven Finn M.D.   On: 05/11/2022 22:14   Result Date: 05/11/2022 CLINICAL DATA:  Neuro deficit, acute, stroke suspected EXAM: CT HEAD WITHOUT CONTRAST TECHNIQUE: Contiguous axial images were obtained from the base of the skull through the vertex without intravenous contrast. RADIATION DOSE REDUCTION: This exam was performed according to the departmental dose-optimization program which includes automated exposure control, adjustment of the mA and/or kV according to patient size and/or use of iterative reconstruction technique. COMPARISON:  MRI head 06/30/2019 BRAIN: BRAIN Cerebral ventricle sizes are concordant with the degree of cerebral volume loss. Patchy and confluent areas of decreased attenuation are noted throughout the deep and periventricular white matter of the cerebral hemispheres bilaterally, compatible with chronic microvascular ischemic disease. Chronic left centrum semiovale lacunar infarction. Chronic right basal ganglia lacunar infarction. No evidence of large-territorial acute infarction. No parenchymal hemorrhage. No mass lesion. No extra-axial collection. No mass effect or midline shift. No hydrocephalus. Basilar cisterns are patent. Vascular: No hyperdense vessel. Atherosclerotic calcifications are present within the cavernous internal carotid and vertebral arteries. Skull: No acute fracture or focal lesion. Sinuses/Orbits: Paranasal sinuses and mastoid air cells are clear. The orbits are unremarkable. Other: None. IMPRESSION: No acute intracranial abnormality. Electronically Signed: By: Iven Finn M.D. On: 05/11/2022 18:47   CT Angio Chest PE W and/or Wo  Contrast  Result Date: 05/11/2022 CLINICAL DATA:  Pulmonary embolism (PE) suspected, high prob. Generalized weakness. EXAM: CT ANGIOGRAPHY CHEST WITH CONTRAST TECHNIQUE: Multidetector CT imaging of the chest was performed using the standard protocol during bolus administration of intravenous contrast. Multiplanar CT image reconstructions and MIPs were obtained to evaluate the vascular anatomy. RADIATION DOSE REDUCTION: This exam was performed according to the departmental dose-optimization program which includes automated exposure control, adjustment of the mA and/or kV according to patient size and/or use of iterative reconstruction technique. CONTRAST:  39mL OMNIPAQUE IOHEXOL 350 MG/ML  SOLN COMPARISON:  CT chest 12/21/2018 FINDINGS: Cardiovascular: Satisfactory opacification of the pulmonary arteries to the segmental level. No evidence of pulmonary embolism. Slightly limited evaluation of the subsegmental level due to motion artifact and atelectasis. The main pulmonary artery is normal in caliber. Prominent right ventricle. Trace pericardial effusion. The thoracic aorta is normal in caliber. Moderate atherosclerotic plaque of the thoracic aorta. At least 2 vessel coronary artery calcifications. Mediastinum/Nodes: No enlarged mediastinal, hilar, or axillary lymph nodes. Thyroid gland, trachea, and esophagus demonstrate no significant findings. Lungs/Pleura: No focal consolidation. No pulmonary nodule. No pulmonary mass. Trace bilateral, left greater than right, pleural effusions. Associated bilateral lower lobe passive atelectasis. No pneumothorax. Upper Abdomen: No acute abnormality.  Splenule noted. Musculoskeletal: No chest wall abnormality. No suspicious lytic or blastic osseous lesions. No acute displaced fracture. Old healed right rib fractures. Plate and screw fixation of an old healed right clavicular fracture. Review of the MIP images confirms the above findings. IMPRESSION: 1. No pulmonary embolus with  slightly limited evaluation of the subsegmental level due to motion artifact and atelectasis. 2. Trace pericardial effusion and trace bilateral pleural effusions, left greater than right. 3. Prominent right cardiac ventricle. 4. Aortic Atherosclerosis (ICD10-I70.0) with at least 2 vessel coronary calcification. Electronically Signed   By: Tish Frederickson M.D.   On: 05/11/2022 23:10   CT ABDOMEN PELVIS W CONTRAST  Result Date: 05/11/2022 CLINICAL DATA:  Abdominal pain, acute, nonlocalized EXAM: CT ABDOMEN AND PELVIS WITH CONTRAST TECHNIQUE: Multidetector CT imaging of the abdomen and pelvis was performed using the standard protocol following bolus administration of intravenous contrast. RADIATION DOSE REDUCTION: This exam was performed according to the departmental dose-optimization program which includes automated exposure control, adjustment of the mA and/or kV according to patient size and/or use of iterative reconstruction technique. CONTRAST:  50mL OMNIPAQUE IOHEXOL 300 MG/ML  SOLN COMPARISON:  None Available. FINDINGS: Lower chest: Elevated left hemidiaphragm. Posterior right lower lobe dependent passive atelectasis. Coronary calcifications. Hepatobiliary: No focal liver abnormality. Contracted gallbladder. No gallstones, gallbladder wall thickening, or pericholecystic fluid. No biliary dilatation. Pancreas: No focal lesion. Normal pancreatic contour. No surrounding inflammatory changes. No main pancreatic ductal dilatation. Spleen: Normal in size without focal abnormality.  Splenule noted. Adrenals/Urinary Tract: No adrenal nodule bilaterally. Bilateral kidneys enhance symmetrically. No hydronephrosis. No hydroureter. The urinary bladder is unremarkable. Stomach/Bowel: Stomach is within normal limits. No evidence of bowel wall thickening or dilatation. Scattered colonic diverticulosis. The appendix is not definitely identified with no inflammatory changes in the right lower quadrant to suggest acute  appendicitis. Vascular/Lymphatic: No abdominal aorta or iliac aneurysm. Severe atherosclerotic plaque of the aorta and its branches. No abdominal, pelvic, or inguinal lymphadenopathy. Reproductive: The prostate measures mildly enlarged (5 cm). Other: No intraperitoneal free fluid. No intraperitoneal free gas. No organized fluid collection. Musculoskeletal: No abdominal wall hernia or abnormality. No suspicious lytic or blastic osseous lesions. No acute displaced fracture. IMPRESSION: 1. No acute intra-abdominal or intrapelvic abnormality. 2. Scattered colonic diverticulosis with no acute diverticulitis. 3. Prostatomegaly. 4. Aortic Atherosclerosis (ICD10-I70.0) - severe. Electronically Signed   By: Tish Frederickson M.D.   On: 05/11/2022 18:58   DG Chest Port 1 View  Result Date: 05/11/2022 CLINICAL DATA:  Weakness for a week. EXAM: PORTABLE CHEST 1 VIEW COMPARISON:  May 14, 2021 FINDINGS: Stable cardiomegaly. The hila and mediastinum are unremarkable. No pneumothorax. No nodules or masses. No focal infiltrates. Healed right rib fractures. Repair of right clavicular fracture. IMPRESSION: No active disease. Electronically Signed   By: Onalee Hua  Jimmye Norman III M.D.   On: 05/11/2022 18:03   ECHOCARDIOGRAM COMPLETE  Result Date: 05/12/2022    ECHOCARDIOGRAM REPORT   Patient Name:   AAHAN MARQUES Date of Exam: 05/12/2022 Medical Rec #:  [id number]       Height:       67.0 in Accession #:    [id number]      Weight:       160.0 lb Date of Birth:  09-10-1951      BSA:          1.839 m Patient Age:    71 years        BP:           118/67 mmHg Patient Gender: M               HR:           72 bpm. Exam Location:  Inpatient Procedure: 2D Echo Indications:    pericardial effusion  History:        Patient has prior history of Echocardiogram examinations, most                 recent 08/31/2020. CAD, chronic kidney disease,                 Signs/Symptoms:elevated troponin; Risk Factors:Hypertension and                 Dyslipidemia.   Sonographer:    Johny Chess RDCS Referring Phys: [id number] [place]  Sonographer Comments: Patient in severe pain. IMPRESSIONS  1. Left ventricular ejection fraction, by estimation, is 60 to 65%. The left ventricle has normal function. The left ventricle has no regional wall motion abnormalities. Left ventricular diastolic parameters are consistent with Grade II diastolic dysfunction (pseudonormalization). Elevated left atrial pressure.  2. Right ventricular systolic function is normal. The right ventricular size is normal. There is mildly elevated pulmonary artery systolic pressure. The estimated right ventricular systolic pressure is 30.1 mmHg.  3. Left atrial size was mildly dilated.  4. The mitral valve is normal in structure. Mild mitral valve regurgitation. No evidence of mitral stenosis.  5. The aortic valve is tricuspid. There is mild calcification of the aortic valve. Aortic valve regurgitation is not visualized. Aortic valve sclerosis is present, with no evidence of aortic valve stenosis.  6. The inferior vena cava is normal in size with greater than 50% respiratory variability, suggesting right atrial pressure of 3 mmHg. Comparison(s): No significant change from prior study. Prior images reviewed side by side. FINDINGS  Left Ventricle: Left ventricular ejection fraction, by estimation, is 60 to 65%. The left ventricle has normal function. The left ventricle has no regional wall motion abnormalities. The left ventricular internal cavity size was normal in size. There is  no left ventricular hypertrophy. Left ventricular diastolic parameters are consistent with Grade II diastolic dysfunction (pseudonormalization). Elevated left atrial pressure. Right Ventricle: The right ventricular size is normal. No increase in right ventricular wall thickness. Right ventricular systolic function is normal. There is mildly elevated pulmonary artery systolic pressure. The tricuspid regurgitant velocity is 3.07   m/s, and with an assumed right atrial pressure of 3 mmHg, the estimated right ventricular systolic pressure is 60.1 mmHg. Left Atrium: Left atrial size was mildly dilated. Right Atrium: Right atrial size was normal in size. Pericardium: There is no evidence of pericardial effusion. Mitral Valve: The mitral valve is normal in structure. Mild mitral valve regurgitation. No evidence of mitral valve stenosis. Tricuspid Valve:  The tricuspid valve is normal in structure. Tricuspid valve regurgitation is trivial. No evidence of tricuspid stenosis. Aortic Valve: The aortic valve is tricuspid. There is mild calcification of the aortic valve. Aortic valve regurgitation is not visualized. Aortic valve sclerosis is present, with no evidence of aortic valve stenosis. Pulmonic Valve: The pulmonic valve was grossly normal. Pulmonic valve regurgitation is not visualized. No evidence of pulmonic stenosis. Aorta: The aortic root and ascending aorta are structurally normal, with no evidence of dilitation. Venous: The inferior vena cava is normal in size with greater than 50% respiratory variability, suggesting right atrial pressure of 3 mmHg. IAS/Shunts: No atrial level shunt detected by color flow Doppler.  LEFT VENTRICLE PLAX 2D LVIDd:         4.20 cm   Diastology LVIDs:         3.00 cm   LV e' medial:    7.13 cm/s LV PW:         1.00 cm   LV E/e' medial:  17.0 LV IVS:        0.95 cm   LV e' lateral:   8.25 cm/s LVOT diam:     2.20 cm   LV E/e' lateral: 14.7 LV SV:         113 LV SV Index:   61 LVOT Area:     3.80 cm  RIGHT VENTRICLE RV S prime:     15.70 cm/s TAPSE (M-mode): 2.5 cm LEFT ATRIUM             Index        RIGHT ATRIUM           Index LA diam:        4.20 cm 2.28 cm/m   RA Area:     15.80 cm LA Vol (A2C):   60.3 ml 32.78 ml/m  RA Volume:   35.80 ml  19.46 ml/m LA Vol (A4C):   74.1 ml 40.29 ml/m LA Biplane Vol: 72.7 ml 39.53 ml/m  AORTIC VALVE LVOT Vmax:   133.00 cm/s LVOT Vmean:  92.900 cm/s LVOT VTI:    0.296 m   AORTA Ao Root diam: 3.30 cm Ao Asc diam:  3.60 cm MITRAL VALVE                TRICUSPID VALVE MV Area (PHT): 4.31 cm     TR Peak grad:   37.7 mmHg MV Decel Time: 176 msec     TR Vmax:        307.00 cm/s MV E velocity: 121.00 cm/s MV A velocity: 92.70 cm/s   SHUNTS MV E/A ratio:  1.31         Systemic VTI:  0.30 m                             Systemic Diam: 2.20 cm Dani Gobble Croitoru MD Electronically signed by Sanda Klein MD Signature Date/Time: 05/12/2022/1:57:14 PM    Final     Cardiac Studies   Echo  Normal LV/RV function. Grade II DD No significant valve disease No pericardial effusion  LHC 09/06/2020 1.  Severe single-vessel coronary artery disease involving the right PDA Deroy Noah, treated successfully with overlapping drug-eluting stents, 0% residual stenosis 2.  Mild nonobstructive disease in the mid LAD 3.  Normal left main and left circumflex 4.  Known normal LV function by echo assessment with LVEF 60%   Recommendations: Same-day PCI protocol, aspirin and clopidogrel for 6  months without interruption.  Patient Profile     TONIE VIZCARRONDO is a 71 y.o. male with CAD s/p DES x2 to [place] (08/2020), HTN, HLD, CKDIII, RA, BPD type 1, GERD, GAD, BPH and vit B12 deficiency who is being seen today for the evaluation of elevated hsT, in the setting of SIRs and pleuritic chest pain at the request of Christian Prosperi  Assessment & Plan    Myopericarditis: he has significant pleuritic chest pain in the setting of SIRs. EKG does not show PR depression or diffuse STE. CRP is high, but suspect this with SIRs He has RA which also increases risk of a pericarditis. Considering this and his symptoms however, agree with continuing management with colchcine and  IV methylprednisone. Can plan for prednisone taper on discharge and 3 months of colchicine. He does not have signs of heart failure.  Without a clear diagnosis will plan for a CMR. - CMR for assessment of myopericarditis - continue colchicine -  continue IV methylpred - echo did not show pericardial effusion - on antibiotics; pending FU cultures, no source  Paroxysmal Atrial fibrillation: in the setting of inflammation. Chads2vasc=3. His PCI was >1 year ago; will stop his plavix and plan to start eliquis. Will initiate heparin for now until closer to discharge  Ischemic Heart disease: no anginal symptoms. Don't suspect his symptoms are c/w ischemia - no plans for an ischemic evaluation - can hold home BB for now with possible Sirs to sepsis - continue statin  For questions or updates, please contact [place] Please consult [email or web address] for contact info under     CHA2DS2-VASc Score = 3   This indicates a 3.2% annual risk of stroke. The patient's score is based upon: CHF History: 0 HTN History: 1 Diabetes History: 0 Stroke History: 0 Vascular Disease History: 1 Age Score: 1 Gender Score: 0        Signed, Janina Mayo, MD  05/13/2022, 8:14 AM

## 2022-05-13 NOTE — Progress Notes (Signed)
ANTICOAGULATION CONSULT NOTE - Initial Consult  Pharmacy Consult for heparin Indication: atrial fibrillation  Allergies  Allergen Reactions   Methotrexate Dermatitis    Developed Mouth Sores   Nsaids Dermatitis    Developed Mouth Blisters   Plaquenil [Hydroxychloroquine] Other (See Comments)    Terrible Nightmares   Azulfidine [Sulfasalazine] Nausea Only   Crestor [Rosuvastatin] Other (See Comments)    Caused abdominal pain that radiated to the back   Isosorbide Other (See Comments)    Made him feel weak   Mevacor [Lovastatin] Other (See Comments)    Caused abdominal pain that radiated to the back   Pravachol [Pravastatin] Other (See Comments)    Caused abdominal pain that radiated to the back    Patient Measurements: Height: 5\' 7"  (170.2 cm) Weight: 68.1 kg (150 lb 3.2 oz) IBW/kg (Calculated) : 66.1 Heparin Dosing Weight: 73kg  Vital Signs: Temp: 98 F (36.7 C) (06/05 0434) Temp Source: Oral (06/05 0434) BP: 176/81 (06/05 0534) Pulse Rate: 86 (06/05 0745)  Labs: Recent Labs    05/11/22 1711 05/11/22 1743 05/11/22 1911 05/12/22 0114 05/12/22 0201 05/13/22 0151  HGB 10.4* 10.9*  --   --  8.9* 11.4*  HCT 32.3* 32.0*  --   --  27.6* 34.4*  PLT 270  --   --   --  177 251  APTT 50*  --   --   --   --   --   LABPROT 15.6*  --   --   --   --   --   INR 1.3*  --   --   --   --   --   CREATININE 1.67* 1.70*  --   --  1.51* 1.29*  CKTOTAL 88  --   --   --   --   --   TROPONINIHS 332*  --  441* 330*  --   --     Estimated Creatinine Clearance: 49.8 mL/min (A) (by C-G formula based on SCr of 1.29 mg/dL (H)).   Medical History: Past Medical History:  Diagnosis Date   Abdominal aortic atherosclerosis ([place])    Anemia    likely of chronic disease   Angina pectoris ([place]) 08/30/2020   Arthritis    Barrett's esophagus without dysplasia 03/25/2019   Formatting of this note might be different from the original. EGD done in 02/2019. Due in 3 years.   Bipolar 1 disorder  ([place])    Bipolar 1 disorder, mixed, moderate ([place]) 08/16/2014   Bipolar affective disorder, currently depressed, moderate ([place]) 03/08/2020   Bipolar depression ([place]) 08/16/2014   BPH with urinary obstruction 12/29/2018   Brain ischemia 03/08/2020   Cardiac murmur 08/30/2020   Chest pain 09/06/2020   Chicken pox    Chronic bronchitis (HCC)    Cognitive complaints 03/08/2020   Coronary artery disease involving native coronary artery of native heart with angina pectoris ([place]) 09/06/2020   Depression    Disc degeneration, 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   Drug therapy 05/18/2021   Essential hypertension    GAD (generalized anxiety disorder) 11/02/2018   Gastroesophageal reflux disease 08/16/2014   Formatting of this note might be different from the original. Last Assessment & Plan:  Well controlled. Continue current medications.   Gastroesophageal reflux disease without esophagitis 08/16/2014   Formatting of this note might be different from the original. Last Assessment & Plan:  Well controlled. Continue current medications.   Herpes gingivostomatitis 02/10/2015   History of colon polyps 03/25/2019   Formatting of  this note might be different from the original. tubular adenoma   History of stroke 11/10/2019   Hyperlipidemia    Hypertension    Ingrowing nail 03/12/2020   Labile hypertension 08/16/2014   Formatting of this note might be different from the original. Last Assessment & Plan:  Slightly above goal today secondary to pain. Will continue current regimen. Will check CMP today.   Lichen planus [id number]   Last Assessment & Plan:  Formatting of this note might be different from the original. Concern over possible lichen planus. His dentist noticed lesions of his oral mucosa.  It was felt to be lichen planus.  He presents to discuss further.  Rarely does the area hurt.He smoked in the distant past EXAM shows several white patches on the buccal mucosa bilaterally.  No other concerning oral mucosal  les   Long-term use of immunosuppressant medication 03/23/2018   Low testosterone 09/17/2016   Midline thoracic back pain 02/10/2015   Mixed hyperlipidemia 08/16/2014   Normochromic normocytic anemia 08/16/2014   PAD (peripheral artery disease) ([place]) 03/23/2019   Pain of right hip joint 01/19/2016   Rheumatic fever    Rheumatoid arthritis ([place]) 10/29/2018   Formatting of this note might be different from the original. Rheum:  Dr. Gerilyn Nestle Formerly on MTX - stopped due to mouth sores. Currently started on Arava.  Also on 5 mg prednisone x 3 months during run-in.   Right-sided chest wall pain 04/17/2019   Seasonal allergies 03/23/2018   Stage 3a chronic kidney disease ([place]) 07/21/2019   Statin intolerance 07/31/2020   Thoracic radiculopathy 06/16/2019   Tremor 06/16/2019   Vitamin B12 deficiency    Injections   Vitamin D deficiency 09/17/2016   Assessment: 71 year old male admitted on 6/3 with weakness. Patient has significant past medical history for CAD, currently being working up for possible ACS. Patient developed new afib overnight, new orders to start IV heparin this morning. No immediate plans for ischemic evaluation. Hemoglobin labile and up to 11.4 this morning. Platelet count is normal.   Goal of Therapy:  Heparin level 0.3-0.7 units/ml Monitor platelets by anticoagulation protocol: Yes   Plan:  Give 4000 units bolus x 1 Start heparin infusion at 1000 units/hr Check anti-Xa level in 8 hours and daily while on heparin Continue to monitor H&H and platelets  Erin Hearing PharmD., BCPS Clinical Pharmacist 05/13/2022 9:28 AM

## 2022-05-13 NOTE — Progress Notes (Signed)
ANTICOAGULATION CONSULT NOTE - follow-up  Pharmacy Consult for heparin Indication: atrial fibrillation  Allergies  Allergen Reactions   Methotrexate Dermatitis    Developed Mouth Sores   Nsaids Dermatitis    Developed Mouth Blisters   Plaquenil [Hydroxychloroquine] Other (See Comments)    Terrible Nightmares   Azulfidine [Sulfasalazine] Nausea Only   Crestor [Rosuvastatin] Other (See Comments)    Caused abdominal pain that radiated to the back   Isosorbide Other (See Comments)    Made him feel weak   Mevacor [Lovastatin] Other (See Comments)    Caused abdominal pain that radiated to the back   Pravachol [Pravastatin] Other (See Comments)    Caused abdominal pain that radiated to the back    Patient Measurements: Height: 5\' 7"  (170.2 cm) Weight: 68.1 kg (150 lb 3.2 oz) IBW/kg (Calculated) : 66.1 Heparin Dosing Weight: 73kg  Vital Signs: Temp: 97.8 F (36.6 C) (06/05 1936) Temp Source: Tympanic (06/05 1936) BP: 182/80 (06/05 1936) Pulse Rate: 66 (06/05 1936)  Labs: Recent Labs    05/11/22 1711 05/11/22 1743 05/11/22 1911 05/12/22 0114 05/12/22 0201 05/13/22 0151 05/13/22 2110  HGB 10.4* 10.9*  --   --  8.9* 11.4*  --   HCT 32.3* 32.0*  --   --  27.6* 34.4*  --   PLT 270  --   --   --  177 251  --   APTT 50*  --   --   --   --   --   --   LABPROT 15.6*  --   --   --   --   --   --   INR 1.3*  --   --   --   --   --   --   HEPARINUNFRC  --   --   --   --   --   --  <0.10*  CREATININE 1.67* 1.70*  --   --  1.51* 1.29*  --   CKTOTAL 88  --   --   --   --   --   --   TROPONINIHS 332*  --  441* 330*  --   --   --      Estimated Creatinine Clearance: 49.8 mL/min (A) (by C-G formula based on SCr of 1.29 mg/dL (H)).   Medical History: Past Medical History:  Diagnosis Date   Abdominal aortic atherosclerosis ([place])    Anemia    likely of chronic disease   Angina pectoris ([place]) 08/30/2020   Arthritis    Barrett's esophagus without dysplasia 03/25/2019    Formatting of this note might be different from the original. EGD done in 02/2019. Due in 3 years.   Bipolar 1 disorder ([place])    Bipolar 1 disorder, mixed, moderate ([place]) 08/16/2014   Bipolar affective disorder, currently depressed, moderate ([place]) 03/08/2020   Bipolar depression ([place]) 08/16/2014   BPH with urinary obstruction 12/29/2018   Brain ischemia 03/08/2020   Cardiac murmur 08/30/2020   Chest pain 09/06/2020   Chicken pox    Chronic bronchitis (HCC)    Cognitive complaints 03/08/2020   Coronary artery disease involving native coronary artery of native heart with angina pectoris ([place]) 09/06/2020   Depression    Disc degeneration, 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   Drug therapy 05/18/2021   Essential hypertension    GAD (generalized anxiety disorder) 11/02/2018   Gastroesophageal reflux disease 08/16/2014   Formatting of this note might be different from the original. Last Assessment & Plan:  Well  controlled. Continue current medications.   Gastroesophageal reflux disease without esophagitis 08/16/2014   Formatting of this note might be different from the original. Last Assessment & Plan:  Well controlled. Continue current medications.   Herpes gingivostomatitis 02/10/2015   History of colon polyps 03/25/2019   Formatting of this note might be different from the original. tubular adenoma   History of stroke 11/10/2019   Hyperlipidemia    Hypertension    Ingrowing nail 03/12/2020   Labile hypertension 08/16/2014   Formatting of this note might be different from the original. Last Assessment & Plan:  Slightly above goal today secondary to pain. Will continue current regimen. Will check CMP today.   Lichen planus [id number]   Last Assessment & Plan:  Formatting of this note might be different from the original. Concern over possible lichen planus. His dentist noticed lesions of his oral mucosa.  It was felt to be lichen planus.  He presents to discuss further.  Rarely does the area hurt.He smoked  in the distant past EXAM shows several white patches on the buccal mucosa bilaterally.  No other concerning oral mucosal les   Long-term use of immunosuppressant medication 03/23/2018   Low testosterone 09/17/2016   Midline thoracic back pain 02/10/2015   Mixed hyperlipidemia 08/16/2014   Normochromic normocytic anemia 08/16/2014   PAD (peripheral artery disease) ([place]) 03/23/2019   Pain of right hip joint 01/19/2016   Rheumatic fever    Rheumatoid arthritis ([place]) 10/29/2018   Formatting of this note might be different from the original. Rheum:  Dr. Gerilyn Nestle Formerly on MTX - stopped due to mouth sores. Currently started on Arava.  Also on 5 mg prednisone x 3 months during run-in.   Right-sided chest wall pain 04/17/2019   Seasonal allergies 03/23/2018   Stage 3a chronic kidney disease ([place]) 07/21/2019   Statin intolerance 07/31/2020   Thoracic radiculopathy 06/16/2019   Tremor 06/16/2019   Vitamin B12 deficiency    Injections   Vitamin D deficiency 09/17/2016   Assessment: 71 year old male admitted on 6/3 with weakness. Patient has significant past medical history for CAD, currently being working up for possible ACS. Patient developed new afib overnight, new orders to start IV heparin this morning. No immediate plans for ischemic evaluation. Hemoglobin labile and up to 11.4 this morning. Platelet count is normal.   Heparin level: <0.1 at 21:10  Goal of Therapy:  Heparin level 0.3-0.7 units/ml Monitor platelets by anticoagulation protocol: Yes   Plan:  Give 2200 units bolus x 1 Increase heparin infusion at 1250 units/hr Check anti-Xa level in 6 hours and daily while on heparin Continue to monitor H&H and platelets  Addie Cederberg BS, PharmD, BCPS Clinical Pharmacist 05/13/2022 9:57 PM  Contact: [phone] after 3 PM  "Be curious, not judgmental..." -Jamal Maes

## 2022-05-13 NOTE — Evaluation (Signed)
Physical Therapy Evaluation Patient Details Name: Noah Grant MRN: [id number] DOB: [birth year] Today's Date: 05/13/2022  History of Present Illness  71 yo admitted 6/3 with weakness, tremor, and sepsis with myopericarditis. PMHx: HTN, HLD, CAD, CKD, RA, bipolar, BPH, GERD, anxiety  Clinical Impression  Pt with flat affect, decreased attention and processing and limited mobility. Pt only able to tolerate standing and pivoting to chair this session with nausea and fatigue. Pt reports dizziness with sitting with BP supine 168/71  and sitting 178/89 with HR 96. Pt with noted desaturation to 85% on RA and required 2L to maintain 95%. Pt with above and below deficits who will benefit from acute therapy to maximize mobility, safety and function to decrease burden of care.      Recommendations for follow up therapy are one component of a multi-disciplinary discharge planning process, led by the attending physician.  Recommendations may be updated based on patient status, additional functional criteria and insurance authorization.  Follow Up Recommendations Home health PT    Assistance Recommended at Discharge Intermittent Supervision/Assistance  Patient can return home with the following  A little help with walking and/or transfers;A little help with bathing/dressing/bathroom;Assistance with cooking/housework;Assist for transportation    Equipment Recommendations Rolling walker (2 wheels);BSC/3in1  Recommendations for Other Services       Functional Status Assessment Patient has had a recent decline in their functional status and demonstrates the ability to make significant improvements in function in a reasonable and predictable amount of time.     Precautions / Restrictions Precautions Precautions: Fall      Mobility  Bed Mobility Overal bed mobility: Needs Assistance Bed Mobility: Supine to Sit     Supine to sit: HOB elevated, Min guard     General bed mobility comments:  guarding for lines and safety with use of rail, HOB 20 degrees    Transfers Overall transfer level: Needs assistance   Transfers: Sit to/from Stand, Bed to chair/wheelchair/BSC Sit to Stand: Min guard Stand pivot transfers: Min guard         General transfer comment: guarding to stand from bed and chair, pivot to chair with single UE support. pt stood again holding Rw for pad placement in chair    Ambulation/Gait               General Gait Details: pt denied attempting due to fatigue  Stairs            Wheelchair Mobility    Modified Rankin (Stroke Patients Only)       Balance Overall balance assessment: Needs assistance   Sitting balance-Leahy Scale: Fair Sitting balance - Comments: static sitting   Standing balance support: Single extremity supported Standing balance-Leahy Scale: Poor Standing balance comment: UE support in standing                             Pertinent Vitals/Pain Pain Assessment Pain Assessment: 0-10 Pain Score: 4  Pain Location: left chest Pain Descriptors / Indicators: Aching Pain Intervention(s): Limited activity within patient's tolerance, Monitored during session, Repositioned    Home Living Family/patient expects to be discharged to:: Private residence Living Arrangements: Spouse/significant other   Type of Home: House Home Access: Stairs to enter   Technical brewer of Steps: 1   Home Layout: One level Home Equipment: Cane - single point      Prior Function Prior Level of Function : Independent/Modified Independent  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     Hand Dominance        Extremity/Trunk Assessment   Upper Extremity Assessment Upper Extremity Assessment: Generalized weakness    Lower Extremity Assessment Lower Extremity Assessment: Generalized weakness    Cervical / Trunk Assessment Cervical / Trunk Assessment: Normal  Communication    Communication: No difficulties  Cognition Arousal/Alertness: Awake/alert Behavior During Therapy: Flat affect Overall Cognitive Status: Impaired/Different from baseline Area of Impairment: Attention, Orientation, Following commands, Problem solving                 Orientation Level: Time Current Attention Level: Sustained   Following Commands: Follows one step commands with increased time     Problem Solving: Slow processing General Comments: pt reports lack of sleep and deficits may be related to this        General Comments      Exercises     Assessment/Plan    PT Assessment Patient needs continued PT services  PT Problem List Decreased strength;Decreased mobility;Decreased activity tolerance;Decreased balance;Decreased knowledge of use of DME;Decreased cognition       PT Treatment Interventions Gait training;Balance training;Stair training;Functional mobility training;Cognitive remediation;Therapeutic activities;Patient/family education;Therapeutic exercise;DME instruction    PT Goals (Current goals can be found in the Care Plan section)  Acute Rehab PT Goals Patient Stated Goal: be able to return home, walk the dog PT Goal Formulation: With patient Time For Goal Achievement: 05/27/22 Potential to Achieve Goals: Fair    Frequency Min 3X/week     Co-evaluation               AM-PAC PT "6 Clicks" Mobility  Outcome Measure Help needed turning from your back to your side while in a flat bed without using bedrails?: A Little Help needed moving from lying on your back to sitting on the side of a flat bed without using bedrails?: A Little Help needed moving to and from a bed to a chair (including a wheelchair)?: A Little Help needed standing up from a chair using your arms (e.g., wheelchair or bedside chair)?: A Little Help needed to walk in hospital room?: Total Help needed climbing 3-5 steps with a railing? : Total 6 Click Score: 14    End of Session    Activity Tolerance: Patient tolerated treatment well Patient left: in chair;with call bell/phone within reach;with chair alarm set Nurse Communication: Mobility status PT Visit Diagnosis: Other abnormalities of gait and mobility (R26.89);Muscle weakness (generalized) (M62.81)    Time: [id number] PT Time Calculation (min) (ACUTE ONLY): 26 min   Charges:   PT Evaluation $PT Eval Moderate Complexity: 1 Mod PT Treatments $Therapeutic Activity: 8-22 mins        Harris Kistler P, PT Acute Rehabilitation Services Pager: [phone] Office: [phone]   Syriah Delisi B Everleigh Colclasure 05/13/2022, 1:34 PM

## 2022-05-13 NOTE — TOC Initial Note (Addendum)
Transition of Care [facility]) - Initial/Assessment Note    Patient Details  Name: Noah Grant MRN: [id number] Date of Birth: 1951/04/01  Transition of Care [facility]) CM/SW Contact:    Bethena Roys, RN Phone Number: 05/13/2022, 4:19 PM  Clinical Narrative: Case Manager spoke with patients spouse regarding home health needs. Spouse is agreeable to home health services-no preference for agency. Case Manager did call around and [facility] is in network to where patient should not have a co pay for visits. Referral submitted to [facility] and start of care to begin within 24-48 hours post transition home. Per spouse, the patient has a cane in the home- Rolling walker ordered via [employer] and will be delivered to the room prior to transitioning home. Case Manager will follow for orders PT/OT.                 Expected Discharge Plan: [place] Barriers to Discharge: Continued Medical Work up   Patient Goals and CMS Choice Patient states their goals for this hospitalization and ongoing recovery are:: to return home.   Choice offered to / list presented to : Spouse (Patients spouse did not have a preference.)  Expected Discharge Plan and Services Expected Discharge Plan: [place] In-house Referral: NA Discharge Planning Services: CM Consult Post Acute Care Choice: Durable Medical Equipment, Home Health Living arrangements for the past 2 months: Single Family Home                 DME Arranged: Walker rolling DME Agency: [employer] Date DME Agency Contacted: 05/13/22 Time DME Agency Contacted: [phone] Representative spoke with at DME Agency: [place]: PT [place]: [place] Date [place]: 05/13/22 Time [place]: 1439 Representative spoke with at [place]: Amy  Prior Living Arrangements/Services Living arrangements for the past 2 months: [place] with:: Spouse Patient language and need for  interpreter reviewed:: Yes        Need for Family Participation in Patient Care: Yes (Comment) Care giver support system in place?: Yes (comment) Current home services: DME (Patient has a cane) Criminal Activity/Legal Involvement Pertinent to Current Situation/Hospitalization: No - Comment as needed  Activities of Daily Living Home Assistive Devices/Equipment: Cane (specify quad or straight) ADL Screening (condition at time of admission) Patient's cognitive ability adequate to safely complete daily activities?: Yes Is the patient deaf or have difficulty hearing?: No Does the patient have difficulty seeing, even when wearing glasses/contacts?: No Does the patient have difficulty concentrating, remembering, or making decisions?: No Patient able to express need for assistance with ADLs?: No Does the patient have difficulty dressing or bathing?: No Independently performs ADLs?: Yes (appropriate for developmental age) Does the patient have difficulty walking or climbing stairs?: Yes Weakness of Legs: Both Weakness of Arms/Hands: Both  Permission Sought/Granted         Permission granted to share info w AGENCY: [facility], [facility].        Emotional Assessment Appearance:: Appears stated age       Alcohol / Substance Use: Not Applicable Psych Involvement: No (comment)  Admission diagnosis:  NSTEMI (non-ST elevated myocardial infarction) ([place]) [I21.4] Sepsis ([place]) [A41.9] Sepsis, due to unspecified organism, unspecified whether acute organ dysfunction present [facility]) [A41.9] Patient Active Problem List   Diagnosis Date Noted   Pericarditis 05/12/2022   Atrial fibrillation with rapid ventricular response ([place]) 05/12/2022   Sepsis ([place]) 05/11/2022   Elevated troponin level not due myocardial infarction  05/11/2022   Lactic acidosis 05/11/2022   Drug therapy 05/18/2021   Hypertension    Anemia    Arthritis    Bipolar 1 disorder (HCC)    Chicken pox    Chronic bronchitis (HCC)     Depression    Hyperlipidemia    Rheumatic fever    Essential hypertension    Coronary artery disease involving native coronary artery of native heart 09/06/2020   Chest pain 09/06/2020   Angina pectoris ([place]) 08/30/2020   Cardiac murmur 08/30/2020   Statin intolerance 07/31/2020   Abdominal aortic atherosclerosis (HCC)    Ingrowing nail 03/12/2020   Bipolar affective disorder, currently depressed, moderate ([place]) 03/08/2020   Brain ischemia 03/08/2020   Cognitive complaints 03/08/2020   History of stroke 11/10/2019   Chronic kidney disease, stage 3a ([place]) 07/21/2019   Thoracic radiculopathy 06/16/2019   Tremor 06/16/2019   Right-sided chest wall pain 04/17/2019   Barrett's esophagus without dysplasia 03/25/2019   History of colon polyps 03/25/2019   PAD (peripheral artery disease) ([place]) 03/23/2019   BPH with urinary obstruction 12/29/2018   GAD (generalized anxiety disorder) 11/02/2018   Rheumatoid arthritis ([place]) [id number]   Lichen planus [id number]   High risk medication use 03/23/2018   Seasonal allergies 03/23/2018   Low testosterone 09/17/2016   Vitamin B12 deficiency 09/17/2016   Vitamin D deficiency 09/17/2016   Disc degeneration, 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   Pain of right hip joint 01/19/2016   Herpes gingivostomatitis 02/10/2015   Midline thoracic back pain 02/10/2015   Normochromic normocytic anemia 08/16/2014   Bipolar 1 disorder, mixed, moderate ([place]) 08/16/2014   Mixed hyperlipidemia 08/16/2014   Labile hypertension 08/16/2014   Gastroesophageal reflux disease 08/16/2014   Bipolar depression ([place]) 08/16/2014   GERD without esophagitis 08/16/2014   PCP:  [name], DO Pharmacy:   [id number] - [place], [place] [address] [place]  [address] Phone: [phone] Fax: [facility] [address] [place] [address] Phone: [phone] Fax:  [facility] [address], [place] [address] Phone: [phone] Fax: [phone]  Readmission Risk Interventions     View : No data to display.

## 2022-05-13 NOTE — Progress Notes (Signed)
OT Cancellation Note  Patient Details Name: MERGIM ROTHWELL MRN: [id number] DOB: [birth year]   Cancelled Treatment:    Reason Eval/Treat Not Completed: Patient at procedure or test/ unavailable Patient off floor at time of attempted OT evaluation, OT will follow back as time allows.   Pollyann Glen E. Canesha Tesfaye, OTR/L Acute Rehabilitation Services [phone]   Cherlyn Cushing 05/13/2022, 10:22 AM

## 2022-05-14 ENCOUNTER — Ambulatory Visit: Payer: PPO | Admitting: Family Medicine

## 2022-05-14 ENCOUNTER — Other Ambulatory Visit (HOSPITAL_COMMUNITY): Payer: Self-pay

## 2022-05-14 ENCOUNTER — Telehealth: Payer: Self-pay

## 2022-05-14 DIAGNOSIS — I514 Myocarditis, unspecified: Secondary | ICD-10-CM

## 2022-05-14 DIAGNOSIS — I309 Acute pericarditis, unspecified: Secondary | ICD-10-CM | POA: Diagnosis not present

## 2022-05-14 DIAGNOSIS — R6520 Severe sepsis without septic shock: Secondary | ICD-10-CM | POA: Diagnosis not present

## 2022-05-14 DIAGNOSIS — I4891 Unspecified atrial fibrillation: Secondary | ICD-10-CM | POA: Diagnosis not present

## 2022-05-14 DIAGNOSIS — M069 Rheumatoid arthritis, unspecified: Secondary | ICD-10-CM | POA: Diagnosis not present

## 2022-05-14 DIAGNOSIS — A419 Sepsis, unspecified organism: Secondary | ICD-10-CM | POA: Diagnosis not present

## 2022-05-14 HISTORY — DX: Myocarditis, unspecified: I51.4

## 2022-05-14 LAB — BASIC METABOLIC PANEL
Anion gap: 4 — ABNORMAL LOW (ref 5–15)
BUN: 40 mg/dL — ABNORMAL HIGH (ref 8–23)
CO2: 21 mmol/L — ABNORMAL LOW (ref 22–32)
Calcium: 8.4 mg/dL — ABNORMAL LOW (ref 8.9–10.3)
Chloride: 112 mmol/L — ABNORMAL HIGH (ref 98–111)
Creatinine, Ser: 1.38 mg/dL — ABNORMAL HIGH (ref 0.61–1.24)
GFR, Estimated: 55 mL/min — ABNORMAL LOW (ref >60)
Glucose, Bld: 155 mg/dL — ABNORMAL HIGH (ref 70–99)
Potassium: 4.2 mmol/L (ref 3.5–5.1)
Sodium: 137 mmol/L (ref 135–145)

## 2022-05-14 LAB — CBC
HCT: 28.2 % — ABNORMAL LOW (ref 39.0–52.0)
Hemoglobin: 9.3 g/dL — ABNORMAL LOW (ref 13.0–17.0)
MCH: 29.7 pg (ref 26.0–34.0)
MCHC: 33 g/dL (ref 30.0–36.0)
MCV: 90.1 fL (ref 80.0–100.0)
Platelets: 257 10*3/uL (ref 150–400)
RBC: 3.13 MIL/uL — ABNORMAL LOW (ref 4.22–5.81)
RDW: 12.5 % (ref 11.5–15.5)
WBC: 10.6 10*3/uL — ABNORMAL HIGH (ref 4.0–10.5)
nRBC: 0 % (ref 0.0–0.2)

## 2022-05-14 LAB — PROCALCITONIN: Procalcitonin: 0.17 ng/mL

## 2022-05-14 LAB — HEPARIN LEVEL (UNFRACTIONATED): Heparin Unfractionated: 0.31 IU/mL (ref 0.30–0.70)

## 2022-05-14 MED ORDER — APIXABAN 5 MG PO TABS
5.0000 mg | ORAL_TABLET | Freq: Two times a day (BID) | ORAL | Status: DC
  Administered 2022-05-14 – 2022-05-15 (×3): 5 mg via ORAL
  Filled 2022-05-14 (×3): qty 1

## 2022-05-14 MED ORDER — METOPROLOL SUCCINATE ER 25 MG PO TB24: 25.0000 mg | ORAL_TABLET | Freq: Every day | ORAL | Status: DC

## 2022-05-14 MED ORDER — METOPROLOL SUCCINATE ER 50 MG PO TB24
50.0000 mg | ORAL_TABLET | Freq: Every day | ORAL | Status: DC
  Administered 2022-05-14 – 2022-05-15 (×2): 50 mg via ORAL
  Filled 2022-05-14 (×2): qty 1

## 2022-05-14 MED ORDER — PREDNISONE 20 MG PO TABS
40.0000 mg | ORAL_TABLET | Freq: Every day | ORAL | Status: DC
  Administered 2022-05-15: 40 mg via ORAL
  Filled 2022-05-14: qty 2

## 2022-05-14 NOTE — Plan of Care (Signed)

## 2022-05-14 NOTE — Discharge Instructions (Signed)

## 2022-05-14 NOTE — Evaluation (Signed)
Occupational Therapy Evaluation Patient Details Name: Noah Grant MRN: [id number] DOB: 1951-01-18 Today's Date: 05/14/2022   History of Present Illness 71 yo admitted 6/3 with weakness, tremor, and sepsis with myopericarditis. PMHx: HTN, HLD, CAD, CKD, RA, bipolar, BPH, GERD, anxiety   Clinical Impression   Pt admitted with the above diagnoses and presents with below problem list. Pt will benefit from continued acute OT to address the below listed deficits and maximize independence with basic ADLs prior to d/c home. At baseline pt is mod I with basic ADLs, spouse assists with IADLs. Pt currently needs min guard assist with LB ADLs and functional mobility. Pt received on 1L O2 resting in bed with sats 91-98. OT placed pt on RA (baseline) and monitored throughout session. Pt noted to have intermittent drops in SpO2 appearing to correspond with activity level. SpO2 recovered easily with cues given for rest break and breathing exercises. Left on RA, notified nursing of SpO2 sats on RA. Pt able to walk the distance of room and back but did need a standing rest break and slow gait speed "I'm just so weak."       Recommendations for follow up therapy are one component of a multi-disciplinary discharge planning process, led by the attending physician.  Recommendations may be updated based on patient status, additional functional criteria and insurance authorization.   Follow Up Recommendations  Home health OT    Assistance Recommended at Discharge Intermittent Supervision/Assistance (OOB functional mobility, LB ADLs, shower transfers)  Patient can return home with the following A little help with walking and/or transfers;A little help with bathing/dressing/bathroom;Assistance with cooking/housework;Direct supervision/assist for medications management;Direct supervision/assist for financial management;Assist for transportation    Functional Status Assessment  Patient has had a recent decline in  their functional status and demonstrates the ability to make significant improvements in function in a reasonable and predictable amount of time.  Equipment Recommendations  Tub/shower bench    Recommendations for Other Services       Precautions / Restrictions Precautions Precautions: Fall Restrictions Weight Bearing Restrictions: No      Mobility Bed Mobility Overal bed mobility: Needs Assistance Bed Mobility: Supine to Sit     Supine to sit: HOB elevated, Min guard     General bed mobility comments: guarding for lines and safety with use of rail, HOB 30 degrees. extra effort, relying a bit on whole body momentum.    Transfers Overall transfer level: Needs assistance Equipment used: Rolling walker (2 wheels) Transfers: Sit to/from Stand Sit to Stand: Min guard           General transfer comment: slightly elevated EOB height to simulate bed at home. min guard for steadying. cues for hand placement and rw technique. to/from EOB and recliner.      Balance Overall balance assessment: Needs assistance   Sitting balance-Leahy Scale: Fair Sitting balance - Comments: donned underwear in sit<>stand. mild LOB with dynamic task, pt able to self correct with UE support.   Standing balance support: Bilateral upper extremity supported Standing balance-Leahy Scale: Poor Standing balance comment: UE support in standing                           ADL either performed or assessed with clinical judgement   ADL Overall ADL's : Needs assistance/impaired Eating/Feeding: Set up;Sitting   Grooming: Modified independent;Set up;Sitting   Upper Body Bathing: Set up;Modified independent;Sitting   Lower Body Bathing: Min guard;Sit to/from stand  Upper Body Dressing : Set up;Modified independent;Sitting Upper Body Dressing Details (indicate cue type and reason): needs assist with buttons at baseline Lower Body Dressing: Min guard;Sit to/from stand;Minimal  assistance Lower Body Dressing Details (indicate cue type and reason): assist to don socks. pt able to don underwear in sit<>stand at min guard level from elevated seat height. extra time and effort. Toilet Transfer: Min guard;Comfort height toilet;Rolling walker (2 wheels)   Toileting- Architect and Hygiene: Min guard;Sit to/from stand Toileting - Clothing Manipulation Details (indicate cue type and reason): elevated seat height Tub/ Shower Transfer: Min guard;Minimal assistance;Walk-in shower;Tub transfer;Ambulation;Rolling walker (2 wheels);Shower seat;Tub bench Tub/Shower Transfer Details (indicate cue type and reason): varying assist level (min g to min A) depending on setup and how pt is feeling. Functional mobility during ADLs: Min guard;Rolling walker (2 wheels) General ADL Comments: Pt completed bed mobility, LB dressing and walked distance of hospital room and back. SpO2 noted to drop at times to 77 at lowest, recovered quickly with rest break and breathing cues. discussed tub transfer bench for tub showers at home.     Vision         Perception     Praxis      Pertinent Vitals/Pain Pain Assessment Pain Assessment: Faces Faces Pain Scale: Hurts a little bit Pain Location: grimacing with movement, generalized Pain Descriptors / Indicators: Aching Pain Intervention(s): Limited activity within patient's tolerance, Monitored during session, Repositioned     Hand Dominance     Extremity/Trunk Assessment Upper Extremity Assessment Upper Extremity Assessment: Generalized weakness   Lower Extremity Assessment Lower Extremity Assessment: Defer to PT evaluation;Generalized weakness   Cervical / Trunk Assessment Cervical / Trunk Assessment: Normal   Communication Communication Communication: No difficulties   Cognition Arousal/Alertness: Awake/alert Behavior During Therapy: WFL for tasks assessed/performed Overall Cognitive Status: Within Functional Limits for  tasks assessed                                       General Comments       Exercises     Shoulder Instructions      Home Living Family/patient expects to be discharged to:: Private residence Living Arrangements: Spouse/significant other Available Help at Discharge: Family (lives with spouse. son lives in [place].) Type of Home: Other(Comment) (townhouse) Home Access: Stairs to enter Secretary/administrator of Steps: 1   Home Layout: One level     Bathroom Shower/Tub: Tub/shower unit;Walk-in shower   Bathroom Toilet: Handicapped height     Home Equipment: Cane - single point;Grab bars - tub/shower   Additional Comments: mod I with basic ADLs. extra time and effort but able to complete without physical assist.      Prior Functioning/Environment Prior Level of Function : Independent/Modified Independent               ADLs Comments: spouse does most of driving and they eat out a lot. Per pt report spouse has breast Ca, last radiation treatment is scheduled for today.        OT Problem List: Decreased strength;Decreased activity tolerance;Impaired balance (sitting and/or standing);Decreased knowledge of use of DME or AE;Decreased knowledge of precautions;Cardiopulmonary status limiting activity;Pain      OT Treatment/Interventions: Self-care/ADL training;Therapeutic exercise;Energy conservation;DME and/or AE instruction;Therapeutic activities;Patient/family education;Balance training    OT Goals(Current goals can be found in the care plan section) Acute Rehab OT Goals Patient Stated Goal: get strength back. regain  PLOF with ADLs. OT Goal Formulation: With patient Time For Goal Achievement: 05/28/22 Potential to Achieve Goals: Good ADL Goals Pt Will Perform Lower Body Bathing: with modified independence;sit to/from stand;with supervision Pt Will Perform Lower Body Dressing: with modified independence;with supervision;sit to/from stand Pt Will  Transfer to Toilet: with modified independence;ambulating Pt Will Perform Toileting - Clothing Manipulation and hygiene: with modified independence;sit to/from stand Pt Will Perform Tub/Shower Transfer: Tub transfer;with supervision;ambulating;tub bench;rolling walker  OT Frequency: Min 2X/week    Co-evaluation              AM-PAC OT "6 Clicks" Daily Activity     Outcome Measure Help from another person eating meals?: A Little Help from another person taking care of personal grooming?: A Little Help from another person toileting, which includes using toliet, bedpan, or urinal?: A Little Help from another person bathing (including washing, rinsing, drying)?: A Little Help from another person to put on and taking off regular upper body clothing?: A Little Help from another person to put on and taking off regular lower body clothing?: A Little 6 Click Score: 18   End of Session Equipment Utilized During Treatment: Rolling walker (2 wheels) Nurse Communication: Mobility status;Other (comment) (SpO2 on RA)  Activity Tolerance: Patient limited by fatigue Patient left: in chair;with call bell/phone within reach;with chair alarm set  OT Visit Diagnosis: Unsteadiness on feet (R26.81);Muscle weakness (generalized) (M62.81);Pain                Time: 0920-0950 OT Time Calculation (min): 30 min Charges:  OT General Charges $OT Visit: 1 Visit OT Evaluation $OT Eval Moderate Complexity: 1 Mod OT Treatments $Self Care/Home Management : 8-22 mins  Raynald Kemp, OT Acute Rehabilitation Services Office: [phone]   Pilar Grammes 05/14/2022, 10:19 AM

## 2022-05-14 NOTE — Progress Notes (Addendum)
Progress Note  Patient Name: Noah Grant Date of Encounter: 05/14/2022  [facility] Cardiologist: Jenean Lindau, MD   Subjective   Feels much better   Blood cultures NG. Crp/ESR elevated. CT abd/pelvis did not show any acute issues. [name] +CAC 2 vessel disease.   He developed atrial fibrillation during this admission. Stopped plavix and started heparin  He is sinus rhythm now.   Inpatient Medications    Scheduled Meds:  amLODipine  10 mg Oral Daily   colchicine  0.6 mg Oral BID   lithium carbonate  450 mg Oral Daily   losartan  100 mg Oral Daily   methylPREDNISolone (SOLU-MEDROL) injection  125 mg Intravenous Daily   metoprolol tartrate  2.5 mg Intravenous Q6H   pantoprazole  40 mg Oral BID   simvastatin  5 mg Oral Daily   Continuous Infusions:  sodium chloride Stopped (05/12/22 0215)   ceFEPime (MAXIPIME) IV 2 g (05/14/22 0016)   heparin 1,250 Units/hr (05/14/22 0220)   lactated ringers 50 mL/hr at 05/13/22 1656   metronidazole 500 mg (05/14/22 0215)   vancomycin 1,000 mg (05/13/22 2312)   PRN Meds: sodium chloride, acetaminophen **OR** acetaminophen, alum & mag hydroxide-simeth, hydrALAZINE, LORazepam, morphine injection, nitroGLYCERIN, ondansetron **OR** ondansetron (ZOFRAN) IV, polyethylene glycol   Vital Signs    Vitals:   05/13/22 1936 05/14/22 0057 05/14/22 0541 05/14/22 0814  BP: (!) 182/80 (!) 170/80 (!) 159/76 (!) 173/83  Pulse: 66 64 69 63  Resp: _0 Temp: 97.8 F (36.6 C)  98.1 F (36.7 C) 97.6 F (36.4 C)  TempSrc: Tympanic  Oral Oral  SpO2: 96% 93% 95% 96%  Weight:      Height:        Intake/Output Summary (Last 24 hours) at 05/14/2022 0846 Last data filed at 05/14/2022 0541 Gross per 24 hour  Intake 2004.08 ml  Output 1550 ml  Net 454.08 ml      05/12/2022    6:09 PM 05/12/2022    7:51 AM 04/19/2022    9:30 AM  Last 3 Weights  Weight (lbs) 150 lb 3.2 oz 160 lb 161 lb  Weight (kg) 68.13 kg 72.576 kg 73.029 kg       Telemetry    Afib yesterday evening, converted to sinus- Personally Reviewed  ECG    NA - Personally Reviewed  Physical Exam   Vitals:   05/14/22 0541 05/14/22 0814  BP: (!) 159/76 (!) 173/83  Pulse: 69 63  Resp: 11 14  Temp: 98.1 F (36.7 C) 97.6 F (36.4 C)  SpO2: 95% 96%    GEN: flushed, acute  distress  Neck: No JVD Cardiac: RRR, no murmurs, rubs, or gallops.  Respiratory: Clear to auscultation bilaterally. GI: Soft, nontender, non-distended  MS: No edema; No deformity. Neuro:  Nonfocal  Psych: Normal affect   Labs    High Sensitivity Troponin:   Recent Labs  Lab 05/11/22 1711 05/11/22 1911 05/12/22 0114  TROPONINIHS 332* 441* 330*     Chemistry Recent Labs  Lab 05/11/22 1711 05/11/22 1743 05/12/22 0201 05/13/22 0151 05/14/22 0407  NA 132*   < > 135 138 137  K 4.0   < > 4.1 4.7 4.2  CL 99   < > 107 109 112*  CO2 19*  --  19* 19* 21*  GLUCOSE 134*   < > 138* 157* 155*  BUN 31*   < > 27* 26* 40*  CREATININE 1.67*   < > 1.51* 1.29*  1.38*  CALCIUM 8.7*  --  8.1* 8.6* 8.4*  MG 2.3  --  2.0  --   --   PROT 6.7  --  5.7*  --   --   ALBUMIN 3.4*  --  2.8*  --   --   AST 21  --  16  --   --   ALT 17  --  13  --   --   ALKPHOS 49  --  44  --   --   BILITOT 0.5  --  0.9  --   --   GFRNONAA 44*  --  49* 60* 55*  ANIONGAP 14  --  9 10 4*   < > = values in this interval not displayed.    Lipids No results for input(s): CHOL, TRIG, HDL, LABVLDL, LDLCALC, CHOLHDL in the last 168 hours.  Hematology Recent Labs  Lab 05/12/22 0201 05/13/22 0151 05/14/22 0407  WBC 10.6* 14.6* 10.6*  RBC 2.94* 3.77* 3.13*  HGB 8.9* 11.4* 9.3*  HCT 27.6* 34.4* 28.2*  MCV 93.9 91.2 90.1  MCH 30.3 30.2 29.7  MCHC 32.2 33.1 33.0  RDW 12.6 12.7 12.5  PLT 177 251 257   Thyroid No results for input(s): TSH, FREET4 in the last 168 hours.  BNPNo results for input(s): BNP, PROBNP in the last 168 hours.  DDimer No results for input(s): DDIMER in the last 168 hours.    Radiology    MR CARDIAC MORPHOLOGY W WO CONTRAST  Result Date: 05/13/2022 CLINICAL DATA:  Evaluate for myocarditis EXAM: CARDIAC MRI TECHNIQUE: The patient was scanned on a 1.5 Tesla Siemens magnet. A dedicated cardiac coil was used. Functional imaging was done using Fiesta sequences. 2,3, and 4 chamber views were done to assess for RWMA's. Modified Simpson's rule using a short axis stack was used to calculate an ejection fraction on a dedicated work Conservation officer, nature. The patient received 10 cc of Gadavist. After 10 minutes inversion recovery sequences were used to assess for infiltration and scar tissue. CONTRAST:  10 cc  of Gadavist FINDINGS: Left ventricle: -Normal size -Normal systolic function -Artifact prevents interpretation of T1 maps -Elevated native T2 values in inferior/inferolateral walls -Basal inferolateral midwall LGE LV EF: 63% (Normal 56-78%) Absolute volumes: LV EDV: 147m (Normal 77-195 mL) LV ESV: 513m(Normal 19-72 mL) LV SV: 8865mNormal 51-133 mL) CO: 5.8L/min (Normal 2.8-8.8 L/min) Indexed volumes: LV EDV: 19m49m-m (Normal 47-92 mL/sq-m) LV ESV: 28mL23mm (Normal 13-30 mL/sq-m) LV SV: 49mL/84m (Normal 32-62 mL/sq-m) CI: 3.3L/min/sq-m (Normal 1.7-4.2 L/min/sq-m) Right ventricle: Normal size and systolic function RV EF:  58% (Normal 47-74%) Absolute volumes: RV EDV: 157mL (11mal 88-227 mL) RV ESV: 66mL (N38ml 23-103 mL) RV SV: 91mL (No50m 52-138 mL) CO: 6.1L/min (Normal 2.8-8.8 L/min) Indexed volumes: RV EDV: 88mL/sq-m63mrmal 55-105 mL/sq-m) RV ESV: 37mL/sq-m 47mmal 15-43 mL/sq-m) RV SV: 51mL/sq-m (44mal 32-64 mL/sq-m) CI: 3.4L/min/sq-m (Normal 1.7-4.2 L/min/sq-m) Left atrium: Mild enlargement Right atrium: Mild enlargement Mitral valve: Trivial regurgitation Aortic valve: No regurgitation Tricuspid valve: Trivial regurgitation Pulmonic valve: No regurgitation Aorta: Normal proximal ascending aorta Pericardium: Normal Extracardiac structures: Moderate left, small  right pleural effusions IMPRESSION: 1. Findings suggestive of acute myocarditis, with elevated myocardial T2 values and basal inferolateral midwall late gadolinium enhancement, which is a scar pattern commonly seen in myocarditis 2.  Normal LV size and systolic function (EF 63%) 3.  Nor73% RV size and systolic function (EF 58%) 4.  Mod71%te left and small right pleural effusions Electronically Signed  By: Oswaldo Milian M.D.   On: 05/13/2022 22:41   ECHOCARDIOGRAM COMPLETE  Result Date: 05/12/2022    ECHOCARDIOGRAM REPORT   Patient Name:   Noah Grant Date of Exam: 05/12/2022 Medical Rec #:  [id number]       Height:       67.0 in Accession #:    [id number]      Weight:       160.0 lb Date of Birth:  May 19, 1951      BSA:          1.839 m Patient Age:    71 years        BP:           118/67 mmHg Patient Gender: M               HR:           72 bpm. Exam Location:  Inpatient Procedure: 2D Echo Indications:    pericardial effusion  History:        Patient has prior history of Echocardiogram examinations, most                 recent 08/31/2020. CAD, chronic kidney disease,                 Signs/Symptoms:elevated troponin; Risk Factors:Hypertension and                 Dyslipidemia.  Sonographer:    Johny Chess RDCS Referring Phys: [id number] [place]  Sonographer Comments: Patient in severe pain. IMPRESSIONS  1. Left ventricular ejection fraction, by estimation, is 60 to 65%. The left ventricle has normal function. The left ventricle has no regional wall motion abnormalities. Left ventricular diastolic parameters are consistent with Grade II diastolic dysfunction (pseudonormalization). Elevated left atrial pressure.  2. Right ventricular systolic function is normal. The right ventricular size is normal. There is mildly elevated pulmonary artery systolic pressure. The estimated right ventricular systolic pressure is 32.2 mmHg.  3. Left atrial size was mildly dilated.  4. The mitral valve is normal  in structure. Mild mitral valve regurgitation. No evidence of mitral stenosis.  5. The aortic valve is tricuspid. There is mild calcification of the aortic valve. Aortic valve regurgitation is not visualized. Aortic valve sclerosis is present, with no evidence of aortic valve stenosis.  6. The inferior vena cava is normal in size with greater than 50% respiratory variability, suggesting right atrial pressure of 3 mmHg. Comparison(s): No significant change from prior study. Prior images reviewed side by side. FINDINGS  Left Ventricle: Left ventricular ejection fraction, by estimation, is 60 to 65%. The left ventricle has normal function. The left ventricle has no regional wall motion abnormalities. The left ventricular internal cavity size was normal in size. There is  no left ventricular hypertrophy. Left ventricular diastolic parameters are consistent with Grade II diastolic dysfunction (pseudonormalization). Elevated left atrial pressure. Right Ventricle: The right ventricular size is normal. No increase in right ventricular wall thickness. Right ventricular systolic function is normal. There is mildly elevated pulmonary artery systolic pressure. The tricuspid regurgitant velocity is 3.07  m/s, and with an assumed right atrial pressure of 3 mmHg, the estimated right ventricular systolic pressure is 02.5 mmHg. Left Atrium: Left atrial size was mildly dilated. Right Atrium: Right atrial size was normal in size. Pericardium: There is no evidence of pericardial effusion. Mitral Valve: The mitral valve is normal in structure. Mild mitral valve regurgitation. No evidence of mitral valve stenosis. Tricuspid  Valve: The tricuspid valve is normal in structure. Tricuspid valve regurgitation is trivial. No evidence of tricuspid stenosis. Aortic Valve: The aortic valve is tricuspid. There is mild calcification of the aortic valve. Aortic valve regurgitation is not visualized. Aortic valve sclerosis is present, with no evidence  of aortic valve stenosis. Pulmonic Valve: The pulmonic valve was grossly normal. Pulmonic valve regurgitation is not visualized. No evidence of pulmonic stenosis. Aorta: The aortic root and ascending aorta are structurally normal, with no evidence of dilitation. Venous: The inferior vena cava is normal in size with greater than 50% respiratory variability, suggesting right atrial pressure of 3 mmHg. IAS/Shunts: No atrial level shunt detected by color flow Doppler.  LEFT VENTRICLE PLAX 2D LVIDd:         4.20 cm   Diastology LVIDs:         3.00 cm   LV e' medial:    7.13 cm/s LV PW:         1.00 cm   LV E/e' medial:  17.0 LV IVS:        0.95 cm   LV e' lateral:   8.25 cm/s LVOT diam:     2.20 cm   LV E/e' lateral: 14.7 LV SV:         113 LV SV Index:   61 LVOT Area:     3.80 cm  RIGHT VENTRICLE RV S prime:     15.70 cm/s TAPSE (M-mode): 2.5 cm LEFT ATRIUM             Index        RIGHT ATRIUM           Index LA diam:        4.20 cm 2.28 cm/m   RA Area:     15.80 cm LA Vol (A2C):   60.3 ml 32.78 ml/m  RA Volume:   35.80 ml  19.46 ml/m LA Vol (A4C):   74.1 ml 40.29 ml/m LA Biplane Vol: 72.7 ml 39.53 ml/m  AORTIC VALVE LVOT Vmax:   133.00 cm/s LVOT Vmean:  92.900 cm/s LVOT VTI:    0.296 m  AORTA Ao Root diam: 3.30 cm Ao Asc diam:  3.60 cm MITRAL VALVE                TRICUSPID VALVE MV Area (PHT): 4.31 cm     TR Peak grad:   37.7 mmHg MV Decel Time: 176 msec     TR Vmax:        307.00 cm/s MV E velocity: 121.00 cm/s MV A velocity: 92.70 cm/s   SHUNTS MV E/A ratio:  1.31         Systemic VTI:  0.30 m                             Systemic Diam: 2.20 cm Dani Gobble Croitoru MD Electronically signed by Sanda Klein MD Signature Date/Time: 05/12/2022/1:57:14 PM    Final     Cardiac Studies   Echo  Normal LV/RV function. Grade II DD No significant valve disease No pericardial effusion  LHC 09/06/2020 1.  Severe single-vessel coronary artery disease involving the right PDA Shaunie Boehm, treated successfully with overlapping  drug-eluting stents, 0% residual stenosis 2.  Mild nonobstructive disease in the mid LAD 3.  Normal left main and left circumflex 4.  Known normal LV function by echo assessment with LVEF 60%   Recommendations: Same-day PCI protocol, aspirin and clopidogrel  for 6 months without interruption.  Patient Profile     Noah Grant is a 71 y.o. male with CAD s/p DES x2 to [place] (08/2020), HTN, HLD, CKDIII, RA, BPD type 1, GERD, GAD, BPH and vit B12 deficiency who is being seen today for the evaluation of elevated hsT, in the setting of SIRs and pleuritic chest pain at the request of Christian Prosperi  Assessment & Plan    Myopericarditis: he has significant pleuritic chest pain in the setting of SIRs. EKG does not show PR depression or diffuse STE. CRP is high, but suspect this with SIRs He has RA which also increases risk of a pericarditis. Considering this and his symptoms however, agree with continuing management with colchcine and  IV methylprednisone. Can plan for prednisone taper on discharge and 3 months of colchicine. He does not have signs of heart failure.   - CMR showed midwall basal inferolateral LGE and elevated T2 c/w myocarditis - continue colchicine for 3 months - can transition IV methryl prednisone. Can do a prednisone taper. 40 mg for a 5 days, 20 mg for a 3 days , 10 mg for a 3 days, then stop - echo did not show pericardial effusion; normal LV function.   Unclear the etiology of his myocarditis. Has had COVID19 mrna vaccine in the past which has risk of myocarditis. Could be related to an RA flair.   Paroxysmal Atrial fibrillation: in the setting of inflammation. Chads2vasc=3. His PCI was >1 year ago; will stop his plavix and plan to start eliquis. He is on heparin, will transition now that he has converted to sinus rhythm. Starting low dose BB.  Ischemic Heart disease: no anginal symptoms. Don't suspect his symptoms are c/w ischemia - no plans for an ischemic evaluation -  transition labetalol to metoprolol XL - continue statin  He can follow up with Dr. Basilio Cairo. Otherwise, he is safe from a cardiac standpoint for discharge.  For questions or updates, please contact [place] Please consult [email or web address] for contact info under     CHA2DS2-VASc Score = 3   This indicates a 3.2% annual risk of stroke. The patient's score is based upon: CHF History: 0 HTN History: 1 Diabetes History: 0 Stroke History: 0 Vascular Disease History: 1 Age Score: 1 Gender Score: 0        Signed, Janina Mayo, MD  05/14/2022, 8:46 AM

## 2022-05-14 NOTE — Care Management Important Message (Signed)
Important Message  Patient Details  Name: Noah Grant MRN: [id number] Date of Birth: 06-Feb-1951   Medicare Important Message Given:  Yes     Renie Ora 05/14/2022, 8:41 AM

## 2022-05-14 NOTE — Progress Notes (Signed)
Mobility Specialist Progress Note    05/14/22 1718  Mobility  Activity Ambulated with assistance in hallway  Level of Assistance Contact guard assist, steadying assist  Assistive Device Front wheel walker  Distance Ambulated (ft) 270 ft  Activity Response Tolerated well  $Mobility charge 1 Mobility   Pt received in bed and agreeable. No complaints on walk. Saturated in mid 90s on RA during entire activity. Returned to bed with call bell in reach.    Hildred Alamin Mobility Specialist

## 2022-05-14 NOTE — Progress Notes (Signed)
Pharmacy Antibiotic Note  Noah Grant is a 71 y.o. male for which pharmacy has been consulted for vancomycin dosing for sepsis.  Cr remains stable, cultures negative.  Plan: Continue cefepime 2g IV q12h Continue vancomycin 1000mg  IV q24h  Height: 5\' 7"  (170.2 cm) Weight: 68.1 kg (150 lb 3.2 oz) IBW/kg (Calculated) : 66.1  Temp (24hrs), Avg:97.8 F (36.6 C), Min:97.6 F (36.4 C), Max:98.1 F (36.7 C)  Recent Labs  Lab 05/11/22 1711 05/11/22 1743 05/11/22 1911 05/12/22 0201 05/13/22 0151 05/14/22 0407  WBC 15.0*  --   --  10.6* 14.6* 10.6*  CREATININE 1.67* 1.70*  --  1.51* 1.29* 1.38*  LATICACIDVEN 2.4*  --  1.0  --   --   --      Estimated Creatinine Clearance: 46.6 mL/min (A) (by C-G formula based on SCr of 1.38 mg/dL (H)).    Allergies  Allergen Reactions   Methotrexate Dermatitis    Developed Mouth Sores   Nsaids Dermatitis    Developed Mouth Blisters   Plaquenil [Hydroxychloroquine] Other (See Comments)    Terrible Nightmares   Azulfidine [Sulfasalazine] Nausea Only   Crestor [Rosuvastatin] Other (See Comments)    Caused abdominal pain that radiated to the back   Isosorbide Other (See Comments)    Made him feel weak   Mevacor [Lovastatin] Other (See Comments)    Caused abdominal pain that radiated to the back   Pravachol [Pravastatin] Other (See Comments)    Caused abdominal pain that radiated to the back    Antimicrobials this admission: ceftriaxone 6/3 >>  vancomycin 6/3 >>  Microbiology results: Pending  Thank you for allowing pharmacy to be a part of this patient's care.  Fredonia Highland, PharmD, BCPS, [facility] Clinical Pharmacist [phone] Please check AMION for all [facility] Pharmacy numbers 05/14/2022

## 2022-05-14 NOTE — Progress Notes (Signed)
ANTICOAGULATION CONSULT NOTE - follow-up  Pharmacy Consult for heparin Indication: atrial fibrillation  Allergies  Allergen Reactions   Methotrexate Dermatitis    Developed Mouth Sores   Nsaids Dermatitis    Developed Mouth Blisters   Plaquenil [Hydroxychloroquine] Other (See Comments)    Terrible Nightmares   Azulfidine [Sulfasalazine] Nausea Only   Crestor [Rosuvastatin] Other (See Comments)    Caused abdominal pain that radiated to the back   Isosorbide Other (See Comments)    Made him feel weak   Mevacor [Lovastatin] Other (See Comments)    Caused abdominal pain that radiated to the back   Pravachol [Pravastatin] Other (See Comments)    Caused abdominal pain that radiated to the back    Patient Measurements: Height: 5\' 7"  (170.2 cm) Weight: 68.1 kg (150 lb 3.2 oz) IBW/kg (Calculated) : 66.1 Heparin Dosing Weight: 73kg  Vital Signs: Temp: 97.8 F (36.6 C) (06/05 1936) Temp Source: Tympanic (06/05 1936) BP: 170/80 (06/06 0057) Pulse Rate: 64 (06/06 0057)  Labs: Recent Labs    05/11/22 1711 05/11/22 1743 05/11/22 1911 05/12/22 0114 05/12/22 0201 05/13/22 0151 05/13/22 2110 05/14/22 0407  HGB 10.4*   < >  --   --  8.9* 11.4*  --  9.3*  HCT 32.3*   < >  --   --  27.6* 34.4*  --  28.2*  PLT 270  --   --   --  177 251  --  257  APTT 50*  --   --   --   --   --   --   --   LABPROT 15.6*  --   --   --   --   --   --   --   INR 1.3*  --   --   --   --   --   --   --   HEPARINUNFRC  --   --   --   --   --   --  <0.10* 0.31  CREATININE 1.67*   < >  --   --  1.51* 1.29*  --  1.38*  CKTOTAL 88  --   --   --   --   --   --   --   TROPONINIHS 332*  --  441* 330*  --   --   --   --    < > = values in this interval not displayed.     Estimated Creatinine Clearance: 46.6 mL/min (A) (by C-G formula based on SCr of 1.38 mg/dL (H)).   Medical History: Past Medical History:  Diagnosis Date   Abdominal aortic atherosclerosis ([place])    Anemia    likely of chronic  disease   Angina pectoris ([place]) 08/30/2020   Arthritis    Barrett's esophagus without dysplasia 03/25/2019   Formatting of this note might be different from the original. EGD done in 02/2019. Due in 3 years.   Bipolar 1 disorder ([place])    Bipolar 1 disorder, mixed, moderate ([place]) 08/16/2014   Bipolar affective disorder, currently depressed, moderate ([place]) 03/08/2020   Bipolar depression ([place]) 08/16/2014   BPH with urinary obstruction 12/29/2018   Brain ischemia 03/08/2020   Cardiac murmur 08/30/2020   Chest pain 09/06/2020   Chicken pox    Chronic bronchitis (HCC)    Cognitive complaints 03/08/2020   Coronary artery disease involving native coronary artery of native heart with angina pectoris ([place]) 09/06/2020   Depression    Disc degeneration, lumbar  03/18/2016   Drug therapy 05/18/2021   Essential hypertension    GAD (generalized anxiety disorder) 11/02/2018   Gastroesophageal reflux disease 08/16/2014   Formatting of this note might be different from the original. Last Assessment & Plan:  Well controlled. Continue current medications.   Gastroesophageal reflux disease without esophagitis 08/16/2014   Formatting of this note might be different from the original. Last Assessment & Plan:  Well controlled. Continue current medications.   Herpes gingivostomatitis 02/10/2015   History of colon polyps 03/25/2019   Formatting of this note might be different from the original. tubular adenoma   History of stroke 11/10/2019   Hyperlipidemia    Hypertension    Ingrowing nail 03/12/2020   Labile hypertension 08/16/2014   Formatting of this note might be different from the original. Last Assessment & Plan:  Slightly above goal today secondary to pain. Will continue current regimen. Will check CMP today.   Lichen planus [id number]   Last Assessment & Plan:  Formatting of this note might be different from the original. Concern over possible lichen planus. His dentist noticed lesions of his oral  mucosa.  It was felt to be lichen planus.  He presents to discuss further.  Rarely does the area hurt.He smoked in the distant past EXAM shows several white patches on the buccal mucosa bilaterally.  No other concerning oral mucosal les   Long-term use of immunosuppressant medication 03/23/2018   Low testosterone 09/17/2016   Midline thoracic back pain 02/10/2015   Mixed hyperlipidemia 08/16/2014   Normochromic normocytic anemia 08/16/2014   PAD (peripheral artery disease) ([place]) 03/23/2019   Pain of right hip joint 01/19/2016   Rheumatic fever    Rheumatoid arthritis ([place]) 10/29/2018   Formatting of this note might be different from the original. Rheum:  Dr. Gerilyn Nestle Formerly on MTX - stopped due to mouth sores. Currently started on Arava.  Also on 5 mg prednisone x 3 months during run-in.   Right-sided chest wall pain 04/17/2019   Seasonal allergies 03/23/2018   Stage 3a chronic kidney disease ([place]) 07/21/2019   Statin intolerance 07/31/2020   Thoracic radiculopathy 06/16/2019   Tremor 06/16/2019   Vitamin B12 deficiency    Injections   Vitamin D deficiency 09/17/2016   Assessment: 71 year old male admitted on 6/3 with weakness. Patient has significant past medical history for CAD, currently being working up for possible ACS. Patient developed new afib overnight, new orders to start IV heparin this morning. No immediate plans for ischemic evaluation. Hemoglobin labile and up to 11.4 this morning. Platelet count is normal.   6/6 AM update:  Heparin level therapeutic   Goal of Therapy:  Heparin level 0.3-0.7 units/ml Monitor platelets by anticoagulation protocol: Yes   Plan:  Cont heparin 1250 units/hr 1300 heparin level  Narda Bonds, PharmD, BCPS Clinical Pharmacist Phone: [phone]

## 2022-05-14 NOTE — Telephone Encounter (Signed)
Received a fax from  [place] regarding an approval for [place] patient assistance from 05/14/22 until patient is no longer on therapy or no longer qualifies based on income/insurance  [facility] phone: [phone] [employer] phone: [phone]  Patient current admitted for sepsis (likely pericarditis) and will be unable to start until fully cleared of infections. Patient is holding leflunomide as ongoing infection is treated  Knox Saliva, PharmD, MPH, BCPS, CPP Clinical Pharmacist (Rheumatology and Pulmonology)

## 2022-05-14 NOTE — Telephone Encounter (Signed)
Pt is currently admitted

## 2022-05-14 NOTE — Progress Notes (Signed)
PROGRESS NOTE    Noah Grant  [id number] DOB: 1951-10-17 DOA: 05/11/2022 PCP: [name], DO    Chief Complaint  Patient presents with   Weakness    Brief Narrative:    71 year old male with past medical history of coronary artery disease (S/P cath with DES to right PDA branch 08/2020), hypertension, hyperlipidemia, chronic kidney disease stage IIIa (Baseline Cr 1.3-1.5) and rheumatoid arthritis (currently on leflunomide due to numerous drug intolerances), bipolar 1 disorder, benign prostatic hyperplasia, gastroesophageal reflux disease, generalized anxiety disorder, vitamin B12 deficiency presenting to [facility] via EMS due to complaints of weakness and tremors. -Patient was noted to have fever of 103.7, white blood cell count of 15 K, admitted for sepsis of unclear etiology, as well he was noted to have severe pleuritic left-sided chest pain on admission.  Assessment & Plan:   Principal Problem:   Sepsis ([place]) Active Problems:   Lactic acidosis   Pericarditis   Elevated troponin level not due myocardial infarction   Rheumatoid arthritis (HCC)   Atrial fibrillation with rapid ventricular response (HCC)   Coronary artery disease involving native coronary artery of native heart   Chronic kidney disease, stage 3a (HCC)   Essential hypertension   Mixed hyperlipidemia   Bipolar 1 disorder, mixed, moderate (HCC)   GERD without esophagitis  SIRS  - Patient presenting with multiple SIRS criteria including tachycardia, extremely high fevers and leukocytosis  - Patient additionally exhibiting evidence of organ dysfunction with markedly elevated troponins and lactic acidosis -This can be related to infectious process, but more likely due to perimyocarditis. -Given his immunocompromise, and elevated CRP and lactic acid on admission, he was started empirically on broad-spectrum antibiotic including IV vancomycin and cefepime, procalcitonin is reassuring today  at 0.17, I will DC IV vancomycin,-remains afebrile and stable will DC IV cefepime in 24 hours. -Continue to trend CRP closely, as it was trending up 17>23  Myopericarditis -Known history of RA. -Significantly elevated inflammatory markers.  CRP trending up at 23 , continue to monitor -No evidence of pericardial effusion on 2D echo. -Cardiac MRI significant for myocarditis. -Management per cardiology, recommendation to continue colchicine x3 months. -Initially on IV Solu-Medrol, can be transitioned to oral prednisone per cardiology recommendation, prednisone taper. 40 mg for a 5 days, 20 mg for a 3 days , 10 mg for a 3 days, then stop    Elevated troponin level not due myocardial infarction -Secondary to demand ischemia, and myocarditis .  Atrial fibrillation with rapid ventricular response (HCC) - Patient developed rapid atrial fibrillation shortly after arrival to the emergency department. -No pleural effusion on echo, heparin GTT initiated, currently transition to Eliquis. -Addition labetalol to metoprolol XL   Rheumatoid arthritis (HCC) - Longstanding history of rheumatoid arthritis on regular leflunomide therapy has placed the patient in an extremely immunocompromised state. - Holding leflunomide for now as we manage a potentially infectious process, this can be resumed on discharge    Coronary artery disease involving native coronary artery of native heart -Aspirin has been stopped now he is on Eliquis -Continue with beta-blockers and statin     Chronic kidney disease, stage 3a ([place]) -Renal function remained stable, continue to monitor closely as he received 2 loads of IV contrast on admission     Essential hypertension -Continue with metoprolol. -Transition to metoprolol   Mixed hyperlipidemia - Continuing home regimen of lipid lowering therapy.     Bipolar 1 disorder, mixed, moderate (HCC) - Continue home regimen of lithium  GERD without esophagitis Continuing  home regimen of daily PPI therapy.      DVT prophylaxis: Lovenox Code Status: Full Family Communication: With wife by phone 6/5 Disposition:   Status is: Inpatient    Consultants:  cardiology  Subjective:  Left-sided chest pain significantly improved  Objective: Vitals:   05/14/22 0541 05/14/22 0814 05/14/22 1052 05/14/22 1203  BP: (!) 159/76 (!) 173/83  (!) 195/77  Pulse: 69 63 73 66  Resp: 11 14  15   Temp: 98.1 F (36.7 C) 97.6 F (36.4 C)  (!) 97.4 F (36.3 C)  TempSrc: Oral Oral  Oral  SpO2: 95% 96%  98%  Weight:      Height:        Intake/Output Summary (Last 24 hours) at 05/14/2022 1652 Last data filed at 05/14/2022 1600 Gross per 24 hour  Intake 1228.02 ml  Output 2140 ml  Net -911.98 ml   Filed Weights   05/12/22 0751 05/12/22 1809  Weight: 72.6 kg 68.1 kg    Examination:  Awake Alert, Oriented X 3, No new F.N deficits, Normal affect Symmetrical Chest wall movement, Good air movement bilaterally, CTAB RRR,No Gallops,Rubs or new Murmurs, No Parasternal Heave +ve B.Sounds, Abd Soft, No tenderness, No rebound - guarding or rigidity. No Cyanosis, Clubbing or edema, No new Rash or bruise       Data Reviewed: I have personally reviewed following labs and imaging studies  CBC: Recent Labs  Lab 05/11/22 1711 05/11/22 1743 05/12/22 0201 05/13/22 0151 05/14/22 0407  WBC 15.0*  --  10.6* 14.6* 10.6*  NEUTROABS  --   --  8.9*  --   --   HGB 10.4* 10.9* 8.9* 11.4* 9.3*  HCT 32.3* 32.0* 27.6* 34.4* 28.2*  MCV 93.6  --  93.9 91.2 90.1  PLT 270  --  177 251 [id number]    Basic Metabolic Panel: Recent Labs  Lab 05/11/22 1711 05/11/22 1743 05/12/22 0201 05/13/22 0151 05/14/22 0407  NA 132* 134* 135 138 137  K 4.0 4.1 4.1 4.7 4.2  CL 99 100 107 109 112*  CO2 19*  --  19* 19* 21*  GLUCOSE 134* 130* 138* 157* 155*  BUN 31* 32* 27* 26* 40*  CREATININE 1.67* 1.70* 1.51* 1.29* 1.38*  CALCIUM 8.7*  --  8.1* 8.6* 8.4*  MG 2.3  --  2.0  --   --      GFR: Estimated Creatinine Clearance: 46.6 mL/min (A) (by C-G formula based on SCr of 1.38 mg/dL (H)).  Liver Function Tests: Recent Labs  Lab 05/11/22 1711 05/12/22 0201  AST 21 16  ALT 17 13  ALKPHOS 49 44  BILITOT 0.5 0.9  PROT 6.7 5.7*  ALBUMIN 3.4* 2.8*    CBG: Recent Labs  Lab 05/11/22 1702  GLUCAP 150*     Recent Results (from the past 240 hour(s))  Blood culture (routine x 2)     Status: None (Preliminary result)   Collection Time: 05/11/22  5:22 PM   Specimen: BLOOD  Result Value Ref Range Status   Specimen Description BLOOD RIGHT ANTECUBITAL  Final   Special Requests   Final    BOTTLES DRAWN AEROBIC AND ANAEROBIC Blood Culture adequate volume   Culture   Final    NO GROWTH 3 DAYS Performed at [facility], [place] [address] [place], [place] [address]    Report Status PENDING  Incomplete  Blood culture (routine x 2)     Status: None (Preliminary result)  Collection Time: 05/11/22  5:29 PM   Specimen: BLOOD LEFT WRIST  Result Value Ref Range Status   Specimen Description BLOOD LEFT WRIST  Final   Special Requests   Final    BOTTLES DRAWN AEROBIC AND ANAEROBIC Blood Culture results may not be optimal due to an inadequate volume of blood received in culture bottles   Culture   Final    NO GROWTH 3 DAYS Performed at [facility], [place] [address] [place], [place] [address]    Report Status PENDING  Incomplete  Urine Culture     Status: Abnormal   Collection Time: 05/11/22  6:35 PM   Specimen: In/Out Cath Urine  Result Value Ref Range Status   Specimen Description IN/OUT CATH URINE  Final   Special Requests   Final    NONE Performed at [facility], [place] [address], [place], [place] [address]    Culture MULTIPLE SPECIES PRESENT, SUGGEST RECOLLECTION (A)  Final   Report Status 05/12/2022 FINAL  Final  Resp Panel by RT-PCR (Flu A&B, Covid) Anterior Nasal Swab     Status: None   Collection Time: 05/11/22  6:40 PM   Specimen: Anterior  Nasal Swab  Result Value Ref Range Status   SARS Coronavirus 2 by RT PCR NEGATIVE NEGATIVE Final    Comment: (NOTE) SARS-CoV-2 target nucleic acids are NOT DETECTED.  The SARS-CoV-2 RNA is generally detectable in upper respiratory specimens during the acute phase of infection. The lowest concentration of SARS-CoV-2 viral copies this assay can detect is 138 copies/mL. A negative result does not preclude SARS-Cov-2 infection and should not be used as the sole basis for treatment or other patient management decisions. A negative result may occur with  improper specimen collection/handling, submission of specimen other than nasopharyngeal swab, presence of viral mutation(s) within the areas targeted by this assay, and inadequate number of viral copies(<138 copies/mL). A negative result must be combined with clinical observations, patient history, and epidemiological information. The expected result is Negative.  Fact Sheet for Patients:  EntrepreneurPulse.com.au  Fact Sheet for Healthcare Providers:  IncredibleEmployment.be  This test is no t yet approved or cleared by the [place] FDA and  has been authorized for detection and/or diagnosis of SARS-CoV-2 by FDA under an Emergency Use Authorization (EUA). This EUA will remain  in effect (meaning this test can be used) for the duration of the COVID-19 declaration under Section 564(b)(1) of the Act, 21 U.S.C.section 360bbb-3(b)(1), unless the authorization is terminated  or revoked sooner.       Influenza A by PCR NEGATIVE NEGATIVE Final   Influenza B by PCR NEGATIVE NEGATIVE Final    Comment: (NOTE) The Xpert Xpress SARS-CoV-2/FLU/RSV plus assay is intended as an aid in the diagnosis of influenza from Nasopharyngeal swab specimens and should not be used as a sole basis for treatment. Nasal washings and aspirates are unacceptable for Xpert Xpress SARS-CoV-2/FLU/RSV testing.  Fact Sheet for  Patients: EntrepreneurPulse.com.au  Fact Sheet for Healthcare Providers: IncredibleEmployment.be  This test is not yet approved or cleared by the [place] FDA and has been authorized for detection and/or diagnosis of SARS-CoV-2 by FDA under an Emergency Use Authorization (EUA). This EUA will remain in effect (meaning this test can be used) for the duration of the COVID-19 declaration under Section 564(b)(1) of the Act, 21 U.S.C. section 360bbb-3(b)(1), unless the authorization is terminated or revoked.  Performed at [facility], [place] [address] [place], [place] [address]  Radiology Studies: MR CARDIAC MORPHOLOGY W WO CONTRAST  Result Date: 05/13/2022 CLINICAL DATA:  Evaluate for myocarditis EXAM: CARDIAC MRI TECHNIQUE: The patient was scanned on a 1.5 Tesla Siemens magnet. A dedicated cardiac coil was used. Functional imaging was done using Fiesta sequences. 2,3, and 4 chamber views were done to assess for RWMA's. Modified Simpson's rule using a short axis stack was used to calculate an ejection fraction on a dedicated work Conservation officer, nature. The patient received 10 cc of Gadavist. After 10 minutes inversion recovery sequences were used to assess for infiltration and scar tissue. CONTRAST:  10 cc  of Gadavist FINDINGS: Left ventricle: -Normal size -Normal systolic function -Artifact prevents interpretation of T1 maps -Elevated native T2 values in inferior/inferolateral walls -Basal inferolateral midwall LGE LV EF: 63% (Normal 56-78%) Absolute volumes: LV EDV: 147mL (Normal 77-195 mL) LV ESV: 100mL (Normal 19-72 mL) LV SV: 12mL (Normal 51-133 mL) CO: 5.8L/min (Normal 2.8-8.8 L/min) Indexed volumes: LV EDV: 29mL/sq-m (Normal 47-92 mL/sq-m) LV ESV: 10mL/sq-m (Normal 13-30 mL/sq-m) LV SV: 19mL/sq-m (Normal 32-62 mL/sq-m) CI: 3.3L/min/sq-m (Normal 1.7-4.2 L/min/sq-m) Right ventricle: Normal size and systolic function RV EF:  58%  (Normal 47-74%) Absolute volumes: RV EDV: 186mL (Normal 88-227 mL) RV ESV: 71mL (Normal 23-103 mL) RV SV: 24mL (Normal 52-138 mL) CO: 6.1L/min (Normal 2.8-8.8 L/min) Indexed volumes: RV EDV: 30mL/sq-m (Normal 55-105 mL/sq-m) RV ESV: 6mL/sq-m (Normal 15-43 mL/sq-m) RV SV: 87mL/sq-m (Normal 32-64 mL/sq-m) CI: 3.4L/min/sq-m (Normal 1.7-4.2 L/min/sq-m) Left atrium: Mild enlargement Right atrium: Mild enlargement Mitral valve: Trivial regurgitation Aortic valve: No regurgitation Tricuspid valve: Trivial regurgitation Pulmonic valve: No regurgitation Aorta: Normal proximal ascending aorta Pericardium: Normal Extracardiac structures: Moderate left, small right pleural effusions IMPRESSION: 1. Findings suggestive of acute myocarditis, with elevated myocardial T2 values and basal inferolateral midwall late gadolinium enhancement, which is a scar pattern commonly seen in myocarditis 2.  Normal LV size and systolic function (EF [id number]) 3.  Normal RV size and systolic function (EF [id number]) 4.  Moderate left and small right pleural effusions Electronically Signed   By: Oswaldo Milian M.D.   On: 05/13/2022 22:41        Scheduled Meds:  amLODipine  10 mg Oral Daily   apixaban  5 mg Oral BID   colchicine  0.6 mg Oral BID   lithium carbonate  450 mg Oral Daily   losartan  100 mg Oral Daily   methylPREDNISolone (SOLU-MEDROL) injection  125 mg Intravenous Daily   metoprolol succinate  50 mg Oral Daily   pantoprazole  40 mg Oral BID   simvastatin  5 mg Oral Daily   Continuous Infusions:  sodium chloride Stopped (05/12/22 0215)   ceFEPime (MAXIPIME) IV 2 g (05/14/22 1048)   lactated ringers 50 mL/hr (05/14/22 1345)   metronidazole 500 mg (05/14/22 1156)   vancomycin 1,000 mg (05/13/22 2312)     LOS: 3 days       Phillips Climes, MD [facility]   To contact the attending provider between 7A-7P or the covering provider during after hours 7P-7A, please log into the web site [email or web address] and  access using universal [place] password for that web site. If you do not have the password, please call the hospital operator.  05/14/2022, 4:52 PM

## 2022-05-14 NOTE — Telephone Encounter (Signed)
-----   Message from [place], [place] sent at 05/14/2022  9:22 AM EDT ----- Regarding: schedule follow up Please contact the patient and arrange follow up with Dr. Tomie China for post hospital follow up in 1-3 weeks. Admitted for Myopericarditis.   Thank you  Azalee Course PA-C

## 2022-05-15 DIAGNOSIS — F3162 Bipolar disorder, current episode mixed, moderate: Secondary | ICD-10-CM | POA: Diagnosis not present

## 2022-05-15 DIAGNOSIS — I4891 Unspecified atrial fibrillation: Secondary | ICD-10-CM | POA: Diagnosis not present

## 2022-05-15 DIAGNOSIS — N1831 Chronic kidney disease, stage 3a: Secondary | ICD-10-CM | POA: Diagnosis not present

## 2022-05-15 DIAGNOSIS — A419 Sepsis, unspecified organism: Secondary | ICD-10-CM | POA: Diagnosis not present

## 2022-05-15 LAB — C-REACTIVE PROTEIN: CRP: 6.7 mg/dL — ABNORMAL HIGH (ref <1.0)

## 2022-05-15 LAB — BASIC METABOLIC PANEL
Anion gap: 10 (ref 5–15)
BUN: 37 mg/dL — ABNORMAL HIGH (ref 8–23)
CO2: 22 mmol/L (ref 22–32)
Calcium: 8.6 mg/dL — ABNORMAL LOW (ref 8.9–10.3)
Chloride: 106 mmol/L (ref 98–111)
Creatinine, Ser: 1.31 mg/dL — ABNORMAL HIGH (ref 0.61–1.24)
GFR, Estimated: 59 mL/min — ABNORMAL LOW (ref >60)
Glucose, Bld: 125 mg/dL — ABNORMAL HIGH (ref 70–99)
Potassium: 4.4 mmol/L (ref 3.5–5.1)
Sodium: 138 mmol/L (ref 135–145)

## 2022-05-15 LAB — CBC
HCT: 29.9 % — ABNORMAL LOW (ref 39.0–52.0)
Hemoglobin: 9.9 g/dL — ABNORMAL LOW (ref 13.0–17.0)
MCH: 30.1 pg (ref 26.0–34.0)
MCHC: 33.1 g/dL (ref 30.0–36.0)
MCV: 90.9 fL (ref 80.0–100.0)
Platelets: 332 10*3/uL (ref 150–400)
RBC: 3.29 MIL/uL — ABNORMAL LOW (ref 4.22–5.81)
RDW: 12.6 % (ref 11.5–15.5)
WBC: 10.6 10*3/uL — ABNORMAL HIGH (ref 4.0–10.5)
nRBC: 0 % (ref 0.0–0.2)

## 2022-05-15 MED ORDER — COLCHICINE 0.6 MG PO TABS: 0.6000 mg | ORAL_TABLET | Freq: Two times a day (BID) | ORAL | 2 refills | Status: DC

## 2022-05-15 MED ORDER — APIXABAN 5 MG PO TABS: 5.0000 mg | ORAL_TABLET | Freq: Two times a day (BID) | ORAL | 0 refills | Status: DC

## 2022-05-15 MED ORDER — METOPROLOL SUCCINATE ER 50 MG PO TB24: 50.0000 mg | ORAL_TABLET | Freq: Every day | ORAL | 0 refills | Status: DC

## 2022-05-15 MED ORDER — PREDNISONE 20 MG PO TABS: ORAL_TABLET | ORAL | 0 refills | Status: AC

## 2022-05-15 NOTE — Plan of Care (Signed)

## 2022-05-15 NOTE — Discharge Summary (Signed)
Physician Discharge Summary   Patient: Noah Grant MRN: [id number] DOB: 12-16-1950  Admit date:     05/11/2022  Discharge date: 05/15/22  Discharge Physician: [name]   PCP: [name], DO   Recommendations at discharge:  - Continue colchicine for 3 months and a prednisone taper (40mg  daily for 3 days, then 20mg  daily for 3 days, then 10mg  daily for 4 days) for myopericarditis.  - Started metoprolol ( in place of labetalol) and eliquis for new diagnosis of AFib.   - Follow up with PCP.   - Follow up with cardiology (they have scheduled this appointment.  - Follow up with rheumatology, Dr. 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 -Patient was noted to have fever of 103.7, white blood cell count of 15 K, admitted for sepsis of unclear etiology, as well he was noted to have severe pleuritic left-sided chest pain on admission.  Assessment and Plan: SIRS -  Patient presenting with multiple SIRS criteria including tachycardia, extremely high fevers and leukocytosis. Has completed  - Patient additionally exhibiting evidence of organ dysfunction with markedly elevated troponins and lactic acidosis -No infectious source found, completed 5 days IV abx. CRP trending down considerably.    Myopericarditis -Known history of RA. -Significantly elevated inflammatory markers.  -No evidence of pericardial effusion on 2D echo. -Cardiac MRI significant for myocarditis. -Management per cardiology, recommendation to continue colchicine x3 months. -Initially on IV Solu-Medrol, can be transitioned to oral prednisone per cardiology recommendation, prednisone taper.    Elevated troponin level not due myocardial infarction -Secondary to demand ischemia, and myocarditis .   Atrial fibrillation with rapid ventricular response (Noah Grant) - Patient developed rapid atrial fibrillation shortly after arrival to the emergency department. -No pleural effusion on echo, heparin GTT initiated, currently transition to Eliquis. -Change labetalol to metoprolol XL   Rheumatoid arthritis (Noah Grant) - Longstanding history of rheumatoid arthritis on regular leflunomide therapy has placed the patient in an extremely immunocompromised state. - Holding leflunomide for now as we manage a potentially infectious process, this can be resumed on discharge   Coronary artery disease involving native coronary artery of native heart -Aspirin has been stopped now he is on Eliquis -Continue with beta-blockers and statin   Chronic kidney disease, stage 3a (Noah Grant) -Renal function remained stable, continue to monitor closely as he received 2 loads of IV contrast on admission   Essential hypertension -Continue with metoprolol. -Transition to metoprolol   Mixed hyperlipidemia - Continuing home regimen of lipid lowering therapy.   Bipolar 1 disorder, mixed, moderate (Noah Grant) - Continue home regimen of  lithium  GERD without esophagitis Continuing home regimen of daily PPI therapy.  Consultants: Cardiology Procedures performed: None  Disposition: Home Diet recommendation:  Cardiac diet DISCHARGE MEDICATION: Allergies as of 05/15/2022       Reactions   Methotrexate Dermatitis   Developed Mouth Sores   Nsaids Dermatitis   Developed Mouth Blisters   Plaquenil [hydroxychloroquine] Other (See Comments)   Terrible Nightmares   Azulfidine [sulfasalazine] Nausea Only   Crestor [rosuvastatin] Other (See Comments)   Caused abdominal pain that radiated to the back   Isosorbide Other (See Comments)   Made him feel weak   Mevacor [lovastatin] Other (See Comments)   Caused abdominal pain that radiated to the back   Pravachol [pravastatin] Other (See Comments)   Caused abdominal pain that radiated to the back        Medication List     STOP taking these medications    clopidogrel 75 MG tablet Commonly known as: PLAVIX   ibuprofen 200 MG tablet Commonly known as: ADVIL   labetalol 100 MG tablet Commonly known as: NORMODYNE       TAKE these medications    amLODipine 10 MG tablet Commonly known as: NORVASC Take 1 tablet (10 mg total) by mouth daily.   apixaban 5 MG Tabs tablet Commonly known as: ELIQUIS Take 1 tablet (5 mg total) by mouth 2 (two) times daily.   Cholecalciferol 50 MCG (2000 UT) Caps Take 2,000 Units by mouth daily.   colchicine 0.6 MG tablet Take 1 tablet (0.6 mg total) by mouth 2 (two) times daily.   CoQ10 200 MG Caps Take 200 mg by mouth daily.   ezetimibe 10 MG tablet Commonly known as: ZETIA TAKE ONE TABLET BY MOUTH DAILY   hydrALAZINE 50 MG tablet Commonly known as: APRESOLINE Take 50 mg by mouth 3 (three) times daily.   leflunomide 20 MG tablet Commonly known as: ARAVA Take 20 mg by mouth every Monday, Tuesday, Wednesday, Thursday, and Friday.   lithium carbonate 150 MG capsule TAKE THREE CAPSULES BY MOUTH DAILY   losartan 100 MG  tablet Commonly known as: COZAAR Take 100 mg by mouth daily.   Lysine HCl 1000 MG Tabs Take 1,000 mg by mouth 2 (two) times daily.   metoprolol succinate 50 MG 24 hr tablet Commonly known as: TOPROL-XL Take 1 tablet (50 mg total) by mouth daily. Take with or immediately following a meal. Start taking on: May 16, 2022   nitroGLYCERIN 0.4 MG SL tablet Commonly known as: NITROSTAT Place 1 tablet (0.4 mg total) under the tongue every 5 (five) minutes as needed for chest pain.   pantoprazole 40 MG tablet Commonly known as: PROTONIX Take 1 tablet (40 mg total) by mouth daily.   predniSONE 20 MG tablet Commonly known as: DELTASONE Take 2 tablets (40 mg total) by mouth daily with breakfast for 3 days, THEN 1 tablet (20 mg total) daily with breakfast for 3 days, THEN 0.5 tablets (10 mg total) daily with breakfast for 4 days. Start taking on: May 16, 2022 What changed:  medication strength See the new instructions.   simvastatin 5 MG tablet Commonly known as: ZOCOR Take 1 tablet (5 mg total) by mouth daily.   Voltaren 1 % Gel Generic drug: diclofenac Sodium Apply 1 application. topically daily as needed.               Durable Medical Equipment  (From admission, onward)           Start  Ordered   05/14/22 1001  For home use only DME Tub bench  Once        05/14/22 1001   05/13/22 1435  For home use only DME Walker rolling  Once       Question Answer Comment  Walker: With [place]   Patient needs a walker to treat with the following condition General weakness      05/13/22 [place], [name] Follow up.   Why: Rolling Walker- to be delivered to the room prior to discharge. Contact information: [place] [address] [place]. Follow up.   Why: Physical/Occupatinal Therapy-office to call for visit times. Contact information: [place] [address] [phone]         Revankar, Reita Cliche, MD Follow up.   Specialty: Cardiology Why: cardiology scheduler will contact you to arrange follow up. Contact information: [place] [address] [phone]         [name], DO Follow up.   Specialty: Family Medicine Contact information: [place] [address] [phone]         Revankar, Reita Cliche, MD .   Specialty: Cardiology Contact information: [place] [address] [phone]         Biagio Quint, MD Follow up.   Specialty: Nephrology Contact information: [id number] [place] [address] [phone]         Collier Salina, MD Follow up on 05/30/2022.   Specialty: Rheumatology Contact information: [address] [place] [address] [phone]                Discharge Exam: Danley Danker Weights   05/12/22 0751 05/12/22 1809  Weight: 72.6 kg 68.1 kg  BP (!) 145/72 (BP Location: Right Arm)   Pulse (!) 52   Temp 98 F (36.7 C) (Oral)   Resp 17   Ht 5\' 7"  (1.702 m)   Wt 68.1 kg   SpO2 95%   BMI 23.52 kg/m   No distress, RRR, no MRG or edema  Condition at discharge: stable  The results of significant diagnostics from this hospitalization (including imaging, microbiology, ancillary and laboratory) are listed below for reference.   Imaging Studies: CT Head Wo Contrast  Addendum Date: 05/11/2022   ADDENDUM REPORT: 05/11/2022 22:14 ADDENDUM: Interval increase in size compared to MRI head 06/30/2019 right scalp 14 x 10 mm cm subcutaneus soft tissue lesion with associated calcifications (new from 2012). Recommend correlation with physical exam. These results were called by telephone at the time of interpretation on 05/11/2022 at 10:14 pm to provider [facility] , who verbally acknowledged these results. Electronically Signed   By: Iven Finn M.D.   On: 05/11/2022 22:14   Result Date: 05/11/2022 CLINICAL DATA:  Neuro deficit, acute, stroke suspected EXAM: CT HEAD WITHOUT CONTRAST TECHNIQUE: Contiguous axial images were obtained from the base of the skull through the vertex without intravenous contrast. RADIATION DOSE REDUCTION: This exam was performed according to the departmental dose-optimization program which includes automated exposure control, adjustment of the mA and/or kV according to patient size and/or use of iterative reconstruction technique. COMPARISON:  MRI head 06/30/2019 BRAIN: BRAIN Cerebral ventricle sizes  are concordant with the degree of cerebral volume loss. Patchy and confluent areas of decreased attenuation are noted throughout the deep and periventricular white matter of the cerebral hemispheres bilaterally, compatible with chronic microvascular ischemic disease. Chronic left centrum semiovale lacunar infarction. Chronic right basal ganglia lacunar infarction. No evidence of large-territorial acute infarction. No parenchymal hemorrhage. No mass lesion. No extra-axial collection. No mass effect or midline shift. No hydrocephalus. Basilar cisterns are patent. Vascular: No hyperdense vessel. Atherosclerotic calcifications are present within the cavernous internal carotid and vertebral arteries. Skull: No acute fracture or focal lesion. Sinuses/Orbits: Paranasal sinuses and mastoid air cells are clear. The orbits are unremarkable. Other: None. IMPRESSION: No acute intracranial abnormality. Electronically Signed: By: Iven Finn M.D. On: 05/11/2022 18:47   CT Angio Chest PE W and/or Wo Contrast  Result Date: 05/11/2022 CLINICAL DATA:  Pulmonary embolism (PE) suspected, high prob. Generalized weakness. EXAM: CT ANGIOGRAPHY CHEST WITH CONTRAST TECHNIQUE: Multidetector CT imaging of the chest was performed using the standard protocol during bolus administration of intravenous contrast. Multiplanar CT image reconstructions  and MIPs were obtained to evaluate the vascular anatomy. RADIATION DOSE REDUCTION: This exam was performed according to the departmental dose-optimization program which includes automated exposure control, adjustment of the mA and/or kV according to patient size and/or use of iterative reconstruction technique. CONTRAST:  65mL OMNIPAQUE IOHEXOL 350 MG/ML SOLN COMPARISON:  CT chest 12/21/2018 FINDINGS: Cardiovascular: Satisfactory opacification of the pulmonary arteries to the segmental level. No evidence of pulmonary embolism. Slightly limited evaluation of the subsegmental level due to motion artifact and atelectasis. The main pulmonary artery is normal in caliber. Prominent right ventricle. Trace pericardial effusion. The thoracic aorta is normal in caliber. Moderate atherosclerotic plaque of the thoracic aorta. At least 2 vessel coronary artery calcifications. Mediastinum/Nodes: No enlarged mediastinal, hilar, or axillary lymph nodes. Thyroid gland, trachea, and esophagus demonstrate no significant findings. Lungs/Pleura: No focal consolidation. No pulmonary nodule. No pulmonary mass. Trace bilateral, left greater than right, pleural effusions. Associated bilateral lower lobe passive atelectasis. No pneumothorax. Upper Abdomen: No acute abnormality.  Splenule noted. Musculoskeletal: No chest wall abnormality. No suspicious lytic or blastic osseous lesions. No acute displaced fracture. Old healed right rib fractures. Plate and screw fixation of an old healed right clavicular fracture. Review of the MIP images confirms the above findings. IMPRESSION: 1. No pulmonary embolus with slightly limited evaluation of the subsegmental level due to motion artifact and atelectasis. 2. Trace pericardial effusion and trace bilateral pleural effusions, left greater than right. 3. Prominent right cardiac ventricle. 4. Aortic Atherosclerosis (ICD10-I70.0) with at least 2 vessel coronary calcification. Electronically Signed   By:  Iven Finn M.D.   On: 05/11/2022 23:10   CT ABDOMEN PELVIS W CONTRAST  Result Date: 05/11/2022 CLINICAL DATA:  Abdominal pain, acute, nonlocalized EXAM: CT ABDOMEN AND PELVIS WITH CONTRAST TECHNIQUE: Multidetector CT imaging of the abdomen and pelvis was performed using the standard protocol following bolus administration of intravenous contrast. RADIATION DOSE REDUCTION: This exam was performed according to the departmental dose-optimization program which includes automated exposure control, adjustment of the mA and/or kV according to patient size and/or use of iterative reconstruction technique. CONTRAST:  4mL OMNIPAQUE IOHEXOL 300 MG/ML  SOLN COMPARISON:  None Available. FINDINGS: Lower chest: Elevated left hemidiaphragm. Posterior right lower lobe dependent passive atelectasis. Coronary calcifications. Hepatobiliary: No focal liver abnormality. Contracted gallbladder. No gallstones, gallbladder wall thickening, or pericholecystic fluid. No biliary dilatation. Pancreas: No focal lesion. Normal pancreatic contour. No surrounding inflammatory changes. No main pancreatic  ductal dilatation. Spleen: Normal in size without focal abnormality.  Splenule noted. Adrenals/Urinary Tract: No adrenal nodule bilaterally. Bilateral kidneys enhance symmetrically. No hydronephrosis. No hydroureter. The urinary bladder is unremarkable. Stomach/Bowel: Stomach is within normal limits. No evidence of bowel wall thickening or dilatation. Scattered colonic diverticulosis. The appendix is not definitely identified with no inflammatory changes in the right lower quadrant to suggest acute appendicitis. Vascular/Lymphatic: No abdominal aorta or iliac aneurysm. Severe atherosclerotic plaque of the aorta and its branches. No abdominal, pelvic, or inguinal lymphadenopathy. Reproductive: The prostate measures mildly enlarged (5 cm). Other: No intraperitoneal free fluid. No intraperitoneal free gas. No organized fluid collection.  Musculoskeletal: No abdominal wall hernia or abnormality. No suspicious lytic or blastic osseous lesions. No acute displaced fracture. IMPRESSION: 1. No acute intra-abdominal or intrapelvic abnormality. 2. Scattered colonic diverticulosis with no acute diverticulitis. 3. Prostatomegaly. 4. Aortic Atherosclerosis (ICD10-I70.0) - severe. Electronically Signed   By: Tish Frederickson M.D.   On: 05/11/2022 18:58   DG Chest Port 1 View  Result Date: 05/11/2022 CLINICAL DATA:  Weakness for a week. EXAM: PORTABLE CHEST 1 VIEW COMPARISON:  May 14, 2021 FINDINGS: Stable cardiomegaly. The hila and mediastinum are unremarkable. No pneumothorax. No nodules or masses. No focal infiltrates. Healed right rib fractures. Repair of right clavicular fracture. IMPRESSION: No active disease. Electronically Signed   By: Gerome Sam III M.D.   On: 05/11/2022 18:03   MR CARDIAC MORPHOLOGY W WO CONTRAST  Result Date: 05/13/2022 CLINICAL DATA:  Evaluate for myocarditis EXAM: CARDIAC MRI TECHNIQUE: The patient was scanned on a 1.5 Tesla Siemens magnet. A dedicated cardiac coil was used. Functional imaging was done using [employer] sequences. 2,3, and 4 chamber views were done to assess for RWMA's. Modified Simpson's rule using a short axis stack was used to calculate an ejection fraction on a dedicated work [employer]. The patient received 10 cc of Gadavist. After 10 minutes inversion recovery sequences were used to assess for infiltration and scar tissue. CONTRAST:  10 cc  of Gadavist FINDINGS: Left ventricle: -Normal size -Normal systolic function -Artifact prevents interpretation of T1 maps -Elevated native T2 values in inferior/inferolateral walls -Basal inferolateral midwall LGE LV EF: 63% (Normal 56-78%) Absolute volumes: LV EDV: (Normal 77-195 mL) LV ESV: 43mL (Normal 19-72 mL) LV SV: 22mL (Normal 51-133 mL) CO: 5.8L/min (Normal 2.8-8.8 L/min) Indexed volumes: LV EDV: 22mL/sq-m (Normal 47-92 mL/sq-m) LV  ESV: 74mL/sq-m (Normal 13-30 mL/sq-m) LV SV: 44mL/sq-m (Normal 32-62 mL/sq-m) CI: 3.3L/min/sq-m (Normal 1.7-4.2 L/min/sq-m) Right ventricle: Normal size and systolic function RV EF:  58% (Normal 47-74%) Absolute volumes: RV EDV: (Normal 88-227 mL) RV ESV: 13mL (Normal 23-103 mL) RV SV: 40mL (Normal 52-138 mL) CO: 6.1L/min (Normal 2.8-8.8 L/min) Indexed volumes: RV EDV: 25mL/sq-m (Normal 55-105 mL/sq-m) RV ESV: 52mL/sq-m (Normal 15-43 mL/sq-m) RV SV: 6mL/sq-m (Normal 32-64 mL/sq-m) CI: 3.4L/min/sq-m (Normal 1.7-4.2 L/min/sq-m) Left atrium: Mild enlargement Right atrium: Mild enlargement Mitral valve: Trivial regurgitation Aortic valve: No regurgitation Tricuspid valve: Trivial regurgitation Pulmonic valve: No regurgitation Aorta: Normal proximal ascending aorta Pericardium: Normal Extracardiac structures: Moderate left, small right pleural effusions IMPRESSION: 1. Findings suggestive of acute myocarditis, with elevated myocardial T2 values and basal inferolateral midwall late gadolinium enhancement, which is a scar pattern commonly seen in myocarditis 2.  Normal LV size and systolic function (EF 63%) 3.  Normal RV size and systolic function (EF 58%) 4.  Moderate left and small right pleural effusions Electronically Signed   By: Emilie Rutter.D.  On: 05/13/2022 22:41   ECHOCARDIOGRAM COMPLETE  Result Date: 05/12/2022    ECHOCARDIOGRAM REPORT   Patient Name:   KUBA ANDOLINA Date of Exam: 05/12/2022 Medical Rec #:  [id number]       Height:       67.0 in Accession #:    [id number]      Weight:       160.0 lb Date of Birth:  [birth year]      BSA:          1.839 m Patient Age:    64 years        BP:           118/67 mmHg Patient Gender: M               HR:           72 bpm. Exam Location:  Inpatient Procedure: 2D Echo Indications:    pericardial effusion  History:        Patient has prior history of Echocardiogram examinations, most                 recent 08/31/2020. CAD, chronic kidney disease,                  Signs/Symptoms:elevated troponin; Risk Factors:Hypertension and                 Dyslipidemia.  Sonographer:    Johny Chess RDCS Referring Phys: [id number] [place]  Sonographer Comments: Patient in severe pain. IMPRESSIONS  1. Left ventricular ejection fraction, by estimation, is 60 to 65%. The left ventricle has normal function. The left ventricle has no regional wall motion abnormalities. Left ventricular diastolic parameters are consistent with Grade II diastolic dysfunction (pseudonormalization). Elevated left atrial pressure.  2. Right ventricular systolic function is normal. The right ventricular size is normal. There is mildly elevated pulmonary artery systolic pressure. The estimated right ventricular systolic pressure is [id number] mmHg.  3. Left atrial size was mildly dilated.  4. The mitral valve is normal in structure. Mild mitral valve regurgitation. No evidence of mitral stenosis.  5. The aortic valve is tricuspid. There is mild calcification of the aortic valve. Aortic valve regurgitation is not visualized. Aortic valve sclerosis is present, with no evidence of aortic valve stenosis.  6. The inferior vena cava is normal in size with greater than 50% respiratory variability, suggesting right atrial pressure of 3 mmHg. Comparison(s): No significant change from prior study. Prior images reviewed side by side. FINDINGS  Left Ventricle: Left ventricular ejection fraction, by estimation, is 60 to 65%. The left ventricle has normal function. The left ventricle has no regional wall motion abnormalities. The left ventricular internal cavity size was normal in size. There is  no left ventricular hypertrophy. Left ventricular diastolic parameters are consistent with Grade II diastolic dysfunction (pseudonormalization). Elevated left atrial pressure. Right Ventricle: The right ventricular size is normal. No increase in right ventricular wall thickness. Right ventricular systolic function is normal.  There is mildly elevated pulmonary artery systolic pressure. The tricuspid regurgitant velocity is 3.07  m/s, and with an assumed right atrial pressure of 3 mmHg, the estimated right ventricular systolic pressure is [id number] mmHg. Left Atrium: Left atrial size was mildly dilated. Right Atrium: Right atrial size was normal in size. Pericardium: There is no evidence of pericardial effusion. Mitral Valve: The mitral valve is normal in structure. Mild mitral valve regurgitation. No evidence of mitral valve stenosis. Tricuspid Valve: The tricuspid valve is normal  in structure. Tricuspid valve regurgitation is trivial. No evidence of tricuspid stenosis. Aortic Valve: The aortic valve is tricuspid. There is mild calcification of the aortic valve. Aortic valve regurgitation is not visualized. Aortic valve sclerosis is present, with no evidence of aortic valve stenosis. Pulmonic Valve: The pulmonic valve was grossly normal. Pulmonic valve regurgitation is not visualized. No evidence of pulmonic stenosis. Aorta: The aortic root and ascending aorta are structurally normal, with no evidence of dilitation. Venous: The inferior vena cava is normal in size with greater than 50% respiratory variability, suggesting right atrial pressure of 3 mmHg. IAS/Shunts: No atrial level shunt detected by color flow Doppler.  LEFT VENTRICLE PLAX 2D LVIDd:         4.20 cm   Diastology LVIDs:         3.00 cm   LV e' medial:    7.13 cm/s LV PW:         1.00 cm   LV E/e' medial:  17.0 LV IVS:        0.95 cm   LV e' lateral:   8.25 cm/s LVOT diam:     2.20 cm   LV E/e' lateral: 14.7 LV SV:         113 LV SV Index:   61 LVOT Area:     3.80 cm  RIGHT VENTRICLE RV S prime:     15.70 cm/s TAPSE (M-mode): 2.5 cm LEFT ATRIUM             Index        RIGHT ATRIUM           Index LA diam:        4.20 cm 2.28 cm/m   RA Area:     15.80 cm LA Vol (A2C):   60.3 ml 32.78 ml/m  RA Volume:   35.80 ml  19.46 ml/m LA Vol (A4C):   74.1 ml 40.29 ml/m LA Biplane Vol:  72.7 ml 39.53 ml/m  AORTIC VALVE LVOT Vmax:   133.00 cm/s LVOT Vmean:  92.900 cm/s LVOT VTI:    0.296 m  AORTA Ao Root diam: 3.30 cm Ao Asc diam:  3.60 cm MITRAL VALVE                TRICUSPID VALVE MV Area (PHT): 4.31 cm     TR Peak grad:   37.7 mmHg MV Decel Time: 176 msec     TR Vmax:        307.00 cm/s MV E velocity: 121.00 cm/s MV A velocity: 92.70 cm/s   SHUNTS MV E/A ratio:  1.31         Systemic VTI:  0.30 m                             Systemic Diam: 2.20 cm Dani Gobble Croitoru MD Electronically signed by Sanda Klein MD Signature Date/Time: 05/12/2022/1:57:14 PM    Final    XR HIPS BILAT W OR W/O PELVIS 2V  Result Date: 04/22/2022 X-ray bilateral hips and pelvis Visualized portion of the lumbar spine appears to have some lateral osteophyte formation.  Minimal femoral acetabular joint change few small cysts are present.  Bone mineralization appears normal no erosions or other abnormal calcifications seen. Impression Mild appearing degenerative joint changes no acute bony abnormality or erosive disease  XR Hand 2 View Left  Result Date: 04/22/2022 X-ray left hand 2 views Radiocarpal carpal joint spaces appear normal.  Mild  degenerative appearing changes in first MCP and in DIP joints.  Bone mineralization appears normal no erosions seen. Impression Mild osteoarthritis no erosive disease changes  XR Hand 2 View Right  Result Date: 04/22/2022 X-ray right hand 2 views Radiocarpal carpal joint spaces appear normal.  Second third MCP joints with early lateral osteophyte formation.  PIP joints appear normal subchondral cysts and DIPs.  Bone mineralization appears normal no erosions seen. Impression Mild osteoarthritis changes no erosive disease  XR Shoulder Left  Result Date: 04/22/2022 X-ray left shoulder 4 views Glenohumeral joint space appears normal.  Normal internal and external rotation position.  AC joint appears normal.  No abnormal calcifications or bone mineralization seen. Impression No  significant arthritis changes  XR Shoulder Right  Result Date: 04/22/2022 X-ray right shoulder 4 views Surgical hardware is in position in the clavicle distal fragment appears in good position.  There is somewhat inferior positioning of acromion at the [facility] joint.  Internal and external rotation movement appears normal.  Glenohumeral joint space appears normal.  No abnormal calcifications or bone mineralization seen. Impression Surgical hardware from remote fracture present no significant arthritis changes   Microbiology: Results for orders placed or performed during the hospital encounter of 05/11/22  Blood culture (routine x 2)     Status: None (Preliminary result)   Collection Time: 05/11/22  5:22 PM   Specimen: BLOOD  Result Value Ref Range Status   Specimen Description BLOOD RIGHT ANTECUBITAL  Final   Special Requests   Final    BOTTLES DRAWN AEROBIC AND ANAEROBIC Blood Culture adequate volume   Culture   Final    NO GROWTH 4 DAYS Performed at [facility], [address], [place], [place] [address]    Report Status PENDING  Incomplete  Blood culture (routine x 2)     Status: None (Preliminary result)   Collection Time: 05/11/22  5:29 PM   Specimen: BLOOD LEFT WRIST  Result Value Ref Range Status   Specimen Description BLOOD LEFT WRIST  Final   Special Requests   Final    BOTTLES DRAWN AEROBIC AND ANAEROBIC Blood Culture results may not be optimal due to an inadequate volume of blood received in culture bottles   Culture   Final    NO GROWTH 4 DAYS Performed at [facility], [place] [address], [place], [place] [address]    Report Status PENDING  Incomplete  Urine Culture     Status: Abnormal   Collection Time: 05/11/22  6:35 PM   Specimen: In/Out Cath Urine  Result Value Ref Range Status   Specimen Description IN/OUT CATH URINE  Final   Special Requests   Final    NONE Performed at [facility], [place] [address], [place], [place] [address]    Culture MULTIPLE  SPECIES PRESENT, SUGGEST RECOLLECTION (A)  Final   Report Status 05/12/2022 FINAL  Final  Resp Panel by RT-PCR (Flu A&B, Covid) Anterior Nasal Swab     Status: None   Collection Time: 05/11/22  6:40 PM   Specimen: Anterior Nasal Swab  Result Value Ref Range Status   SARS Coronavirus 2 by RT PCR NEGATIVE NEGATIVE Final    Comment: (NOTE) SARS-CoV-2 target nucleic acids are NOT DETECTED.  The SARS-CoV-2 RNA is generally detectable in upper respiratory specimens during the acute phase of infection. The lowest concentration of SARS-CoV-2 viral copies this assay can detect is 138 copies/mL. A negative result does not preclude SARS-Cov-2 infection and should not  be used as the sole basis for treatment or other patient management decisions. A negative result may occur with  improper specimen collection/handling, submission of specimen other than nasopharyngeal swab, presence of viral mutation(s) within the areas targeted by this assay, and inadequate number of viral copies(<138 copies/mL). A negative result must be combined with clinical observations, patient history, and epidemiological information. The expected result is Negative.  Fact Sheet for Patients:  EntrepreneurPulse.com.au  Fact Sheet for Healthcare Providers:  IncredibleEmployment.be  This test is no t yet approved or cleared by the [place] FDA and  has been authorized for detection and/or diagnosis of SARS-CoV-2 by FDA under an Emergency Use Authorization (EUA). This EUA will remain  in effect (meaning this test can be used) for the duration of the COVID-19 declaration under Section 564(b)(1) of the Act, 21 U.S.C.section 360bbb-3(b)(1), unless the authorization is terminated  or revoked sooner.       Influenza A by PCR NEGATIVE NEGATIVE Final   Influenza B by PCR NEGATIVE NEGATIVE Final    Comment: (NOTE) The Xpert Xpress SARS-CoV-2/FLU/RSV plus assay is intended as an aid in  the diagnosis of influenza from Nasopharyngeal swab specimens and should not be used as a sole basis for treatment. Nasal washings and aspirates are unacceptable for Xpert Xpress SARS-CoV-2/FLU/RSV testing.  Fact Sheet for Patients: EntrepreneurPulse.com.au  Fact Sheet for Healthcare Providers: IncredibleEmployment.be  This test is not yet approved or cleared by the [place] FDA and has been authorized for detection and/or diagnosis of SARS-CoV-2 by FDA under an Emergency Use Authorization (EUA). This EUA will remain in effect (meaning this test can be used) for the duration of the COVID-19 declaration under Section 564(b)(1) of the Act, 21 U.S.C. section 360bbb-3(b)(1), unless the authorization is terminated or revoked.  Performed at [facility], [place] [address] [place], [place] [address]     Labs: CBC: Recent Labs  Lab 05/11/22 1711 05/11/22 1743 05/12/22 0201 05/13/22 0151 05/14/22 0407 05/15/22 0306  WBC 15.0*  --  10.6* 14.6* 10.6* 10.6*  NEUTROABS  --   --  8.9*  --   --   --   HGB 10.4* 10.9* 8.9* 11.4* 9.3* 9.9*  HCT 32.3* 32.0* 27.6* 34.4* 28.2* 29.9*  MCV 93.6  --  93.9 91.2 90.1 90.9  PLT 270  --  177 251 257 [id number]   Basic Metabolic Panel: Recent Labs  Lab 05/11/22 1711 05/11/22 1743 05/12/22 0201 05/13/22 0151 05/14/22 0407 05/15/22 0306  NA 132* 134* 135 138 137 138  K 4.0 4.1 4.1 4.7 4.2 4.4  CL 99 100 107 109 112* 106  CO2 19*  --  19* 19* 21* 22  GLUCOSE 134* 130* 138* 157* 155* 125*  BUN 31* 32* 27* 26* 40* 37*  CREATININE 1.67* 1.70* 1.51* 1.29* 1.38* 1.31*  CALCIUM 8.7*  --  8.1* 8.6* 8.4* 8.6*  MG 2.3  --  2.0  --   --   --    Liver Function Tests: Recent Labs  Lab 05/11/22 1711 05/12/22 0201  AST 21 16  ALT 17 13  ALKPHOS 49 44  BILITOT 0.5 0.9  PROT 6.7 5.7*  ALBUMIN 3.4* 2.8*   CBG: Recent Labs  Lab 05/11/22 1702  GLUCAP 150*    Discharge time spent: greater than 30  minutes.  Signed: [name], MD [facility] 05/15/2022

## 2022-05-15 NOTE — Telephone Encounter (Signed)
Pt remains admitted in the hospital. Attempted to call tos schedule appointment but no answer.

## 2022-05-15 NOTE — Progress Notes (Signed)
Follow up arranged with primary cardiologist on 07/09/22 first available appt. See AVS

## 2022-05-16 ENCOUNTER — Telehealth: Payer: Self-pay

## 2022-05-16 LAB — CULTURE, BLOOD (ROUTINE X 2)
Culture: NO GROWTH
Culture: NO GROWTH
Special Requests: ADEQUATE

## 2022-05-16 NOTE — Telephone Encounter (Signed)
Transition Care Management Follow-up Telephone Call Date of discharge and from where: [facility] 05-15-22 Dx: Sepsis How have you been since you were released from the hospital? Doing ok  Any questions or concerns? No  Items Reviewed: Did the pt receive and understand the discharge instructions provided? Yes  Medications obtained and verified? Yes  Other? No  Any new allergies since your discharge? No  Dietary orders reviewed? Yes Do you have support at home? Yes   Home Care and Equipment/Supplies: Were home health services ordered? Yes- PT/OT If so, what is the name of the agency? [facility]   Has the agency set up a time to come to the patient's home? yes Were any new equipment or medical supplies ordered?  Yes: RW- tub bench What is the name of the medical supply agency? [name]  Were you able to get the supplies/equipment? yes Do you have any questions related to the use of the equipment or supplies? No  Functional Questionnaire: (I = Independent and D = Dependent) ADLs: I  Bathing/Dressing- I  Meal Prep- I  Eating- I  Maintaining continence- I  Transferring/Ambulation- I-WALKER  Managing Meds- I  Follow up appointments reviewed:  PCP Hospital f/u appt confirmed? Yes  Scheduled to see Dr Nani Ravens on 05-22-22 @ 1115am. [facility] f/u appt confirmed? Yes  Scheduled to see Dr Geraldo Pitter on 07-09-22 @ 11am and Dr [name] 05-30-22 at 1020am . Are transportation arrangements needed? No  If their condition worsens, is the pt aware to call PCP or go to the Emergency Dept.? Yes Was the patient provided with contact information for the PCP's office or ED? Yes Was to pt encouraged to call back with questions or concerns? Yes

## 2022-05-20 DIAGNOSIS — Z09 Encounter for follow-up examination after completed treatment for conditions other than malignant neoplasm: Secondary | ICD-10-CM | POA: Diagnosis not present

## 2022-05-20 DIAGNOSIS — Z8679 Personal history of other diseases of the circulatory system: Secondary | ICD-10-CM | POA: Diagnosis not present

## 2022-05-21 NOTE — Telephone Encounter (Signed)
It does not look like any particular infection was identified during the hospitalization. Cannot rule out his RA itself as a possible cause of pericarditis. Maybe can just wait until after he has his hospital follow ups to see if acute event is resolved, PCP office f/u scheduled 6/14 and with cardiology 7/5.

## 2022-05-21 NOTE — Telephone Encounter (Signed)
D/w Dr. Dimple Casey. Patient should hold leflunomide post d/c since he is on prednisone taper and colchicine. Patient has history of neutropenia which may be 2/2 leflunomide use and safest option at this time is to hold leflunomide. His RA and gout should remain managed on prednisone taper for now.  Spoke with patient and advised of above. He will plan to hold. He has been taking since d/c. Did review that Actemra was approved through patient assistance but Dr. Dimple Casey would like to wait until pt sees cardiologist to move forward with starting. We are still awaiting Actemra samples as well.  Chesley Mires, PharmD, MPH, BCPS, CPP Clinical Pharmacist (Rheumatology and Pulmonology)

## 2022-05-22 ENCOUNTER — Ambulatory Visit (INDEPENDENT_AMBULATORY_CARE_PROVIDER_SITE_OTHER): Payer: PPO | Admitting: Family Medicine

## 2022-05-22 ENCOUNTER — Telehealth: Payer: Self-pay

## 2022-05-22 ENCOUNTER — Encounter: Payer: Self-pay | Admitting: Family Medicine

## 2022-05-22 VITALS — BP 131/80 | HR 45 | Temp 98.2°F | Ht 67.0 in | Wt 153.4 lb

## 2022-05-22 DIAGNOSIS — I319 Disease of pericardium, unspecified: Secondary | ICD-10-CM | POA: Diagnosis not present

## 2022-05-22 DIAGNOSIS — N1831 Chronic kidney disease, stage 3a: Secondary | ICD-10-CM | POA: Diagnosis not present

## 2022-05-22 DIAGNOSIS — I2510 Atherosclerotic heart disease of native coronary artery without angina pectoris: Secondary | ICD-10-CM | POA: Diagnosis not present

## 2022-05-22 DIAGNOSIS — F3170 Bipolar disorder, currently in remission, most recent episode unspecified: Secondary | ICD-10-CM | POA: Diagnosis not present

## 2022-05-22 DIAGNOSIS — A419 Sepsis, unspecified organism: Secondary | ICD-10-CM | POA: Diagnosis not present

## 2022-05-22 DIAGNOSIS — F411 Generalized anxiety disorder: Secondary | ICD-10-CM | POA: Diagnosis not present

## 2022-05-22 DIAGNOSIS — I214 Non-ST elevation (NSTEMI) myocardial infarction: Secondary | ICD-10-CM | POA: Diagnosis not present

## 2022-05-22 DIAGNOSIS — M069 Rheumatoid arthritis, unspecified: Secondary | ICD-10-CM | POA: Diagnosis not present

## 2022-05-22 DIAGNOSIS — I480 Paroxysmal atrial fibrillation: Secondary | ICD-10-CM | POA: Diagnosis not present

## 2022-05-22 DIAGNOSIS — K219 Gastro-esophageal reflux disease without esophagitis: Secondary | ICD-10-CM | POA: Diagnosis not present

## 2022-05-22 DIAGNOSIS — I32 Pericarditis in diseases classified elsewhere: Secondary | ICD-10-CM | POA: Diagnosis not present

## 2022-05-22 DIAGNOSIS — I10 Essential (primary) hypertension: Secondary | ICD-10-CM | POA: Diagnosis not present

## 2022-05-22 MED ORDER — PANTOPRAZOLE SODIUM 40 MG PO TBEC: 40.0000 mg | DELAYED_RELEASE_TABLET | Freq: Two times a day (BID) | ORAL | 2 refills | Status: DC

## 2022-05-22 MED ORDER — APIXABAN 5 MG PO TABS
5.0000 mg | ORAL_TABLET | Freq: Two times a day (BID) | ORAL | 0 refills | Status: DC
Start: 2022-05-22 — End: 2022-07-05

## 2022-05-22 NOTE — Telephone Encounter (Signed)
[facility] verbal orders given to continue PT. Verbal orders given.

## 2022-05-22 NOTE — Patient Instructions (Signed)
We will complete the form for the Eliquis.   You don't have gout to my knowledge.  Stay as active as able.   Let us know if you need anything.

## 2022-05-22 NOTE — Progress Notes (Signed)
Chief Complaint  Patient presents with   Hospitalization Follow-up    Subjective: Patient is a 71 y.o. male here for hosp f/u.  He is here with his wife.  Patient was admitted on 06/07/2022 and discharged on 05/15/2022 for [facility] for sepsis secondary to pericarditis.  He is currently finishing a prednisone taper and has been placed on colchicine for 3 total months.  He will still have intermittent chest pain resolved with nitroglycerin.  He has a follow-up with the cardiology team in a few weeks.  He reports compliance with medication and no adverse effects.  He is still weak but this is steadily improving.  Appetite is fair.  He also developed atrial fibrillation with RVR while inpatient.  He was started on Eliquis 5 mg twice daily and his labetalol was changed to metoprolol.  He reports no issues since the initial event.  Past Medical History:  Diagnosis Date   Abdominal aortic atherosclerosis ([place])    Anemia    likely of chronic disease   Angina pectoris ([place]) 08/30/2020   Arthritis    Barrett's esophagus without dysplasia 03/25/2019   Formatting of this note might be different from the original. EGD done in 02/2019. Due in 3 years.   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 Last Assessment & Plan:  Well controlled. Continue current medications.   Gastroesophageal reflux disease without esophagitis 08/16/2014   Formatting of this note might be different from the original. Last Assessment & Plan:  Well controlled. Continue current medications.   Herpes gingivostomatitis 02/10/2015   History of colon polyps 03/25/2019   Formatting of this note might be different from the original. tubular adenoma   History of stroke 11/10/2019   Hyperlipidemia    Hypertension    Ingrowing nail 03/12/2020   Labile hypertension 08/16/2014   Formatting of this note might be different from the original. Last Assessment & Plan:  Slightly above goal today secondary to pain. Will continue current regimen. Will check CMP today.   Lichen planus [id number]   Last Assessment & Plan:  Formatting of this note might be different from the original. Concern over possible lichen planus. His dentist noticed lesions of his oral mucosa.  It was felt to be lichen planus.  He presents to discuss further.  Rarely does the area hurt.He smoked in the distant past EXAM shows several white patches on the buccal mucosa bilaterally.  No other concerning oral mucosal les   Long-term use of immunosuppressant medication 03/23/2018   Low testosterone 09/17/2016   Midline thoracic back pain 02/10/2015   Mixed hyperlipidemia 08/16/2014   Normochromic normocytic anemia 08/16/2014   PAD (peripheral artery disease) ([place]) 03/23/2019   Pain of right hip joint 01/19/2016   Rheumatic fever    Rheumatoid  arthritis ([place]) 10/29/2018   Formatting of this note might be different from the original. Rheum:  Dr. Gerilyn Nestle Formerly on MTX - stopped due to mouth sores. Currently started on Arava.  Also on 5 mg prednisone x 3 months during run-in.   Right-sided chest wall pain 04/17/2019   Seasonal allergies 03/23/2018   Stage 3a chronic kidney disease ([place]) 07/21/2019   Statin intolerance 07/31/2020    Thoracic radiculopathy 06/16/2019   Tremor 06/16/2019   Vitamin B12 deficiency    Injections   Vitamin D deficiency 09/17/2016    Objective: BP 131/80   Pulse (!) 45   Temp 98.2 F (36.8 C) (Oral)   Ht 5\' 7"  (1.702 m)   Wt 153 lb 6 oz (69.6 kg)   SpO2 99%   BMI 24.02 kg/m  General: Awake, appears stated age Heart: Regular rhythm, bradycardic, 1+ pitting lower extremity edema around the ankles bilaterally Lungs: CTAB, no rales, wheezes or rhonchi. No accessory muscle use Abdomen: Bowel sounds present, soft, nontender, nondistended Neuro: Gait is cautious Psych: Age appropriate judgment and insight, normal affect and mood  Assessment and Plan: Pericarditis, unspecified chronicity, unspecified type  Paroxysmal atrial fibrillation (HCC)  Gastroesophageal reflux disease, unspecified whether esophagitis present - Plan: pantoprazole (PROTONIX) 40 MG tablet  Finish prednisone taper, continue 90-day course of colchicine.  He has follow-up with the cardiology team in a few weeks. Continue Eliquis until cardiology gives their blessing to stop if ever.  Will help fill out patient assistance paperwork today. We will increase his dosage of Protonix from 40 mg daily to twice daily.  Reflux precautions discussed.  Consider famotidine as needed. Follow-up as originally scheduled. The patient and his spouse voiced understanding and agreement to the plan.  I spent 35 minutes with the patient and his wife discussing the above plan in addition to reviewing his chart and standing visit.  [place], DO 05/22/22  12:11 PM

## 2022-05-29 NOTE — Progress Notes (Signed)
Office Visit Note  Patient: Noah Grant             Date of Birth: January 11, 1951           MRN: [id number]             PCP: [name], DO Referring: [name]* Visit Date: 05/30/2022   Subjective:  No chief complaint on file.   History of Present Illness: Noah Grant is a 71 y.o. male here for follow up for RA he was on leflunomide recently hospitalized with sepsis and pericarditis without clear underlying infection identified.  Symptoms started with chills developed increasing swelling in his hands and feet with joint pain that was worse in the shoulders and hips.  He developed severe chest pain felt a constriction or tightness around the heart.  The chest pain was sharp did report provocation with deep breaths or lying in left or right positions.  Evaluation was consistent with pericarditis with effusion.  He was discharged on colchicine and received short term steroids and the chest pain is currently doing well.  The flareup of joint pain has also improved with the oral anti-inflammatory medication.  Previous HPI 04/19/22 Noah Grant is a 70 y.o. male here for rheumatoid arthritis previously seeing Dr. Gerilyn Nestle and on treatment with leflunomide.  Symptoms started a long time ago gradual onset but has been seeing trucks for treatment of this in the past decade or so.  He has joint pain and stiffness involving multiple areas.  Shoulders, wrists, hands, also bilateral hip pains.  Most commonly sees visible swelling or inflammation in his hands.  He has morning stiffness very severe for 30 minutes to an hour but keeps persistent joint stiffness throughout the day.  Also has pain increased when trying to lie still at night.  He has tried a number of treatments for this over multiple years.  He did not tolerate hydroxychloroquine due to confusion, MTX not tolerated, leflunomide leukopenia, and sulfasalazine mood disturbances.  He never experienced a significant  improvement with Humira injection and developed acute conjunctivitis shortly after starting Remicade infusion.   DMARD Hx LEF current MTX GI intolerance HCQ Psychiatric side effects SSZ Psychiatric side effects Humira nonresponder Remicade nonresponder and conjunctivitis   12/2021 Cholesterol 107 TGs 193 HDL 28.9   09/2021 HBV neg HCV neg   Review of Systems  Constitutional:  Positive for fatigue.  HENT:  Negative for mouth dryness.   Eyes:  Negative for dryness.  Respiratory:  Positive for shortness of breath.   Cardiovascular:  Positive for swelling in legs/feet.  Gastrointestinal:  Negative for constipation.  Endocrine: Positive for cold intolerance, excessive thirst and increased urination.  Genitourinary:  Negative for difficulty urinating.  Musculoskeletal:  Positive for joint pain, gait problem, joint pain, joint swelling, muscle weakness, morning stiffness and muscle tenderness.  Skin:  Negative for rash.  Allergic/Immunologic: Positive for susceptible to infections.  Neurological:  Positive for weakness.  Hematological:  Positive for bruising/bleeding tendency.  Psychiatric/Behavioral:  Positive for sleep disturbance.     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   Pericarditis 05/12/2022   Atrial fibrillation with rapid ventricular response ([place]) 05/12/2022   Sepsis ([place]) 05/11/2022   Elevated troponin level not due myocardial infarction 05/11/2022   Lactic acidosis 05/11/2022   Drug therapy 05/18/2021   Hypertension    Anemia    Arthritis    Bipolar 1 disorder (  HCC)    Chicken pox    Chronic bronchitis (HCC)    Depression    Hyperlipidemia    Rheumatic fever    Essential hypertension    Coronary artery disease involving native coronary artery of native heart 09/06/2020   Chest pain 09/06/2020   Angina pectoris (HCC) 08/30/2020   Cardiac murmur 08/30/2020   Statin intolerance 07/31/2020    Abdominal aortic atherosclerosis (HCC)    Ingrowing nail 03/12/2020   Bipolar affective disorder, currently depressed, moderate (HCC) 03/08/2020   Brain ischemia 03/08/2020   Cognitive complaints 03/08/2020   History of stroke 11/10/2019   Chronic kidney disease, stage 3a (HCC) 07/21/2019   Thoracic radiculopathy 06/16/2019   Tremor 06/16/2019   Right-sided chest wall pain 04/17/2019   Barrett's esophagus without dysplasia 03/25/2019   History of colon polyps 03/25/2019   PAD (peripheral artery disease) (HCC) 03/23/2019   BPH with urinary obstruction 12/29/2018   GAD (generalized anxiety disorder) 11/02/2018   Rheumatoid arthritis (HCC) 10/29/2018   Lichen planus 04/07/2018   High risk medication use 03/23/2018   Seasonal allergies 03/23/2018   Low testosterone 09/17/2016   Vitamin B12 deficiency 09/17/2016   Vitamin D deficiency 09/17/2016   Disc degeneration, 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   Pain of right hip joint 01/19/2016   Herpes gingivostomatitis 02/10/2015   Midline thoracic back pain 02/10/2015   Normochromic normocytic anemia 08/16/2014   Bipolar 1 disorder, mixed, moderate (HCC) 08/16/2014   Mixed hyperlipidemia 08/16/2014   Labile hypertension 08/16/2014   Gastroesophageal reflux disease 08/16/2014   Bipolar depression (HCC) 08/16/2014   GERD without esophagitis 08/16/2014    Past Medical History:  Diagnosis Date   Abdominal aortic atherosclerosis (HCC)    Anemia    likely of chronic disease   Angina pectoris (HCC) 08/30/2020   Arthritis    Atrial fibrillation (HCC)    Barrett's esophagus without dysplasia 03/25/2019   Formatting of this note might be different from the original. EGD done in 02/2019. Due in 3 years.   Bipolar 1 disorder (HCC)    Bipolar 1 disorder, mixed, moderate (HCC) 08/16/2014   Bipolar affective disorder, currently depressed, moderate (HCC) 03/08/2020   Bipolar depression (HCC) 08/16/2014   BPH with urinary obstruction 12/29/2018   Brain ischemia  03/08/2020   Cardiac murmur 08/30/2020   Chest pain 09/06/2020   Chicken pox    Chronic bronchitis (HCC)    Cognitive complaints 03/08/2020   Coronary artery disease involving native coronary artery of native heart with angina pectoris (HCC) 09/06/2020   Depression    Disc degeneration, 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   Drug therapy 05/18/2021   Essential hypertension    GAD (generalized anxiety disorder) 11/02/2018   Gastroesophageal reflux disease 08/16/2014   Formatting of this note might be different from the original. Last Assessment & Plan:  Well controlled. Continue current medications.   Gastroesophageal reflux disease without esophagitis 08/16/2014   Formatting of this note might be different from the original. Last Assessment & Plan:  Well controlled. Continue current medications.   Herpes gingivostomatitis 02/10/2015   History of colon polyps 03/25/2019   Formatting of this note might be different from the original. tubular adenoma   History of stroke 11/10/2019   Hyperlipidemia    Hypertension    Ingrowing nail 03/12/2020   Labile hypertension 08/16/2014   Formatting of this note might be different from the original. Last Assessment & Plan:  Slightly above goal today secondary to pain. Will continue current regimen. Will check CMP today.  Lichen planus [id number]   Last Assessment & Plan:  Formatting of this note might be different from the original. Concern over possible lichen planus. His dentist noticed lesions of his oral mucosa.  It was felt to be lichen planus.  He presents to discuss further.  Rarely does the area hurt.He smoked in the distant past EXAM shows several white patches on the buccal mucosa bilaterally.  No other concerning oral mucosal les   Long-term use of immunosuppressant medication 03/23/2018   Low testosterone 09/17/2016   Midline thoracic back pain 02/10/2015   Mixed hyperlipidemia 08/16/2014   Normochromic normocytic anemia 08/16/2014   PAD (peripheral artery  disease) ([place]) 03/23/2019   Pain of right hip joint 01/19/2016   Rheumatic fever    Rheumatoid arthritis ([place]) 10/29/2018   Formatting of this note might be different from the original. Rheum:  Dr. Gerilyn Nestle Formerly on MTX - stopped due to mouth sores. Currently started on Arava.  Also on 5 mg prednisone x 3 months during run-in.   Right-sided chest wall pain 04/17/2019   Seasonal allergies 03/23/2018   Stage 3a chronic kidney disease ([place]) 07/21/2019   Statin intolerance 07/31/2020   Thoracic radiculopathy 06/16/2019   Tremor 06/16/2019   Vitamin B12 deficiency    Injections   Vitamin D deficiency 09/17/2016    Family History  Problem Relation Age of Onset   Alzheimer's disease Mother 83       Deceased in early 98s   Stroke Father 38       Deceased   Hypertension Father    Alcoholism Father    Alcoholism Brother    Heart disease Maternal Grandfather 64       Deceased   Heart disease Paternal Grandfather 57       Deceased   Hypertension Son    Past Surgical History:  Procedure Laterality Date   CLAVICLE SURGERY     Right, Hardware placed   CORONARY STENT INTERVENTION N/A 09/06/2020   Procedure: CORONARY STENT INTERVENTION;  Surgeon: Sherren Mocha, MD;  Location: [facility];  Service: Cardiovascular;  Laterality: N/A;   LEFT HEART CATH AND CORONARY ANGIOGRAPHY N/A 09/06/2020   Procedure: LEFT HEART CATH AND CORONARY ANGIOGRAPHY;  Surgeon: Sherren Mocha, MD;  Location: [facility];  Service: Cardiovascular;  Laterality: N/A;   WISDOM TOOTH EXTRACTION     Social History   Social History Narrative   Not on file   Immunization History  Administered Date(s) Administered   Fluad Quad(high Dose 65+) 09/03/2021   Influenza, High Dose Seasonal PF 09/10/2019, 09/03/2020   Influenza,inj,Quad PF,6+ Mos 08/22/2014, 10/04/2015   Moderna SARS-COV2 Booster Vaccination 10/31/2020, 07/04/2021   Moderna Sars-Covid-2 Vaccination 01/17/2020, 02/13/2020   PNEUMOCOCCAL  CONJUGATE-20 01/02/2022   Zoster Recombinat (Shingrix) 07/02/2017, 09/02/2017   Zoster, Live 08/17/2011     Objective: Vital Signs: BP (!) 152/71 (BP Location: Left Arm, Patient Position: Sitting, Cuff Size: Small)   Pulse (!) 50   Resp 13   Ht 5\' 7"  (1.702 m)   Wt 153 lb 3.2 oz (69.5 kg)   BMI 23.99 kg/m    Physical Exam Cardiovascular:     Rate and Rhythm: Normal rate and regular rhythm.  Pulmonary:     Effort: Pulmonary effort is normal.     Breath sounds: Normal breath sounds.  Musculoskeletal:     Right lower leg: No edema.     Left lower leg: No edema.  Skin:    General: Skin is warm and  dry.     Findings: No rash.  Neurological:     Mental Status: He is alert.  Psychiatric:        Mood and Affect: Mood normal.      Musculoskeletal Exam:  Shoulders full ROM some tenderness with range of motion no palpable swelling Elbows full ROM no tenderness or swelling Wrists full ROM no tenderness or swelling No significant finger joint tenderness to pressure Knees full ROM no tenderness or swelling  Investigation: No additional findings.  Imaging: MR CARDIAC MORPHOLOGY W WO CONTRAST  Result Date: 05/13/2022 CLINICAL DATA:  Evaluate for myocarditis EXAM: CARDIAC MRI TECHNIQUE: The patient was scanned on a 1.5 Tesla Siemens magnet. A dedicated cardiac coil was used. Functional imaging was done using Fiesta sequences. 2,3, and 4 chamber views were done to assess for RWMA's. Modified Simpson's rule using a short axis stack was used to calculate an ejection fraction on a dedicated work [employer]. The patient received 10 cc of Gadavist. After 10 minutes inversion recovery sequences were used to assess for infiltration and scar tissue. CONTRAST:  10 cc  of Gadavist FINDINGS: Left ventricle: -Normal size -Normal systolic function -Artifact prevents interpretation of T1 maps -Elevated native T2 values in inferior/inferolateral walls -Basal inferolateral midwall LGE  LV EF: 63% (Normal 56-78%) Absolute volumes: LV EDV: (Normal 77-195 mL) LV ESV: 31mL (Normal 19-72 mL) LV SV: 1mL (Normal 51-133 mL) CO: 5.8L/min (Normal 2.8-8.8 L/min) Indexed volumes: LV EDV: 48mL/sq-m (Normal 47-92 mL/sq-m) LV ESV: 59mL/sq-m (Normal 13-30 mL/sq-m) LV SV: 47mL/sq-m (Normal 32-62 mL/sq-m) CI: 3.3L/min/sq-m (Normal 1.7-4.2 L/min/sq-m) Right ventricle: Normal size and systolic function RV EF:  58% (Normal 47-74%) Absolute volumes: RV EDV: (Normal 88-227 mL) RV ESV: 51mL (Normal 23-103 mL) RV SV: 76mL (Normal 52-138 mL) CO: 6.1L/min (Normal 2.8-8.8 L/min) Indexed volumes: RV EDV: 69mL/sq-m (Normal 55-105 mL/sq-m) RV ESV: 26mL/sq-m (Normal 15-43 mL/sq-m) RV SV: 29mL/sq-m (Normal 32-64 mL/sq-m) CI: 3.4L/min/sq-m (Normal 1.7-4.2 L/min/sq-m) Left atrium: Mild enlargement Right atrium: Mild enlargement Mitral valve: Trivial regurgitation Aortic valve: No regurgitation Tricuspid valve: Trivial regurgitation Pulmonic valve: No regurgitation Aorta: Normal proximal ascending aorta Pericardium: Normal Extracardiac structures: Moderate left, small right pleural effusions IMPRESSION: 1. Findings suggestive of acute myocarditis, with elevated myocardial T2 values and basal inferolateral midwall late gadolinium enhancement, which is a scar pattern commonly seen in myocarditis 2.  Normal LV size and systolic function (EF 63%) 3.  Normal RV size and systolic function (EF 58%) 4.  Moderate left and small right pleural effusions Electronically Signed   By: Epifanio Lesches M.D.   On: 05/13/2022 22:41   ECHOCARDIOGRAM COMPLETE  Result Date: 05/12/2022    ECHOCARDIOGRAM REPORT   Patient Name:   THOMES BURAK Date of Exam: 05/12/2022 Medical Rec #:  [id number]       Height:       67.0 in Accession #:    [id number]      Weight:       160.0 lb Date of Birth:  04-10-1951      BSA:          1.839 m Patient Age:    70 years        BP:           118/67 mmHg Patient Gender: M               HR:           72  bpm. Exam Location:  Inpatient Procedure: 2D  Echo Indications:    pericardial effusion  History:        Patient has prior history of Echocardiogram examinations, most                 recent 08/31/2020. CAD, chronic kidney disease,                 Signs/Symptoms:elevated troponin; Risk Factors:Hypertension and                 Dyslipidemia.  Sonographer:    Johny Chess RDCS Referring Phys: [id number] [place]  Sonographer Comments: Patient in severe pain. IMPRESSIONS  1. Left ventricular ejection fraction, by estimation, is 60 to 65%. The left ventricle has normal function. The left ventricle has no regional wall motion abnormalities. Left ventricular diastolic parameters are consistent with Grade II diastolic dysfunction (pseudonormalization). Elevated left atrial pressure.  2. Right ventricular systolic function is normal. The right ventricular size is normal. There is mildly elevated pulmonary artery systolic pressure. The estimated right ventricular systolic pressure is [id number] mmHg.  3. Left atrial size was mildly dilated.  4. The mitral valve is normal in structure. Mild mitral valve regurgitation. No evidence of mitral stenosis.  5. The aortic valve is tricuspid. There is mild calcification of the aortic valve. Aortic valve regurgitation is not visualized. Aortic valve sclerosis is present, with no evidence of aortic valve stenosis.  6. The inferior vena cava is normal in size with greater than 50% respiratory variability, suggesting right atrial pressure of 3 mmHg. Comparison(s): No significant change from prior study. Prior images reviewed side by side. FINDINGS  Left Ventricle: Left ventricular ejection fraction, by estimation, is 60 to 65%. The left ventricle has normal function. The left ventricle has no regional wall motion abnormalities. The left ventricular internal cavity size was normal in size. There is  no left ventricular hypertrophy. Left ventricular diastolic parameters are consistent  with Grade II diastolic dysfunction (pseudonormalization). Elevated left atrial pressure. Right Ventricle: The right ventricular size is normal. No increase in right ventricular wall thickness. Right ventricular systolic function is normal. There is mildly elevated pulmonary artery systolic pressure. The tricuspid regurgitant velocity is 3.07  m/s, and with an assumed right atrial pressure of 3 mmHg, the estimated right ventricular systolic pressure is [id number] mmHg. Left Atrium: Left atrial size was mildly dilated. Right Atrium: Right atrial size was normal in size. Pericardium: There is no evidence of pericardial effusion. Mitral Valve: The mitral valve is normal in structure. Mild mitral valve regurgitation. No evidence of mitral valve stenosis. Tricuspid Valve: The tricuspid valve is normal in structure. Tricuspid valve regurgitation is trivial. No evidence of tricuspid stenosis. Aortic Valve: The aortic valve is tricuspid. There is mild calcification of the aortic valve. Aortic valve regurgitation is not visualized. Aortic valve sclerosis is present, with no evidence of aortic valve stenosis. Pulmonic Valve: The pulmonic valve was grossly normal. Pulmonic valve regurgitation is not visualized. No evidence of pulmonic stenosis. Aorta: The aortic root and ascending aorta are structurally normal, with no evidence of dilitation. Venous: The inferior vena cava is normal in size with greater than 50% respiratory variability, suggesting right atrial pressure of 3 mmHg. IAS/Shunts: No atrial level shunt detected by color flow Doppler.  LEFT VENTRICLE PLAX 2D LVIDd:         4.20 cm   Diastology LVIDs:         3.00 cm   LV e' medial:    7.13 cm/s LV PW:  1.00 cm   LV E/e' medial:  17.0 LV IVS:        0.95 cm   LV e' lateral:   8.25 cm/s LVOT diam:     2.20 cm   LV E/e' lateral: 14.7 LV SV:         113 LV SV Index:   61 LVOT Area:     3.80 cm  RIGHT VENTRICLE RV S prime:     15.70 cm/s TAPSE (M-mode): 2.5 cm LEFT  ATRIUM             Index        RIGHT ATRIUM           Index LA diam:        4.20 cm 2.28 cm/m   RA Area:     15.80 cm LA Vol (A2C):   60.3 ml 32.78 ml/m  RA Volume:   35.80 ml  19.46 ml/m LA Vol (A4C):   74.1 ml 40.29 ml/m LA Biplane Vol: 72.7 ml 39.53 ml/m  AORTIC VALVE LVOT Vmax:   133.00 cm/s LVOT Vmean:  92.900 cm/s LVOT VTI:    0.296 m  AORTA Ao Root diam: 3.30 cm Ao Asc diam:  3.60 cm MITRAL VALVE                TRICUSPID VALVE MV Area (PHT): 4.31 cm     TR Peak grad:   37.7 mmHg MV Decel Time: 176 msec     TR Vmax:        307.00 cm/s MV E velocity: 121.00 cm/s MV A velocity: 92.70 cm/s   SHUNTS MV E/A ratio:  1.31         Systemic VTI:  0.30 m                             Systemic Diam: 2.20 cm Dani Gobble Croitoru MD Electronically signed by Sanda Klein MD Signature Date/Time: 05/12/2022/1:57:14 PM    Final    CT Angio Chest PE W and/or Wo Contrast  Result Date: 05/11/2022 CLINICAL DATA:  Pulmonary embolism (PE) suspected, high prob. Generalized weakness. EXAM: CT ANGIOGRAPHY CHEST WITH CONTRAST TECHNIQUE: Multidetector CT imaging of the chest was performed using the standard protocol during bolus administration of intravenous contrast. Multiplanar CT image reconstructions and MIPs were obtained to evaluate the vascular anatomy. RADIATION DOSE REDUCTION: This exam was performed according to the departmental dose-optimization program which includes automated exposure control, adjustment of the mA and/or kV according to patient size and/or use of iterative reconstruction technique. CONTRAST:  48mL OMNIPAQUE IOHEXOL 350 MG/ML SOLN COMPARISON:  CT chest 12/21/2018 FINDINGS: Cardiovascular: Satisfactory opacification of the pulmonary arteries to the segmental level. No evidence of pulmonary embolism. Slightly limited evaluation of the subsegmental level due to motion artifact and atelectasis. The main pulmonary artery is normal in caliber. Prominent right ventricle. Trace pericardial effusion. The thoracic  aorta is normal in caliber. Moderate atherosclerotic plaque of the thoracic aorta. At least 2 vessel coronary artery calcifications. Mediastinum/Nodes: No enlarged mediastinal, hilar, or axillary lymph nodes. Thyroid gland, trachea, and esophagus demonstrate no significant findings. Lungs/Pleura: No focal consolidation. No pulmonary nodule. No pulmonary mass. Trace bilateral, left greater than right, pleural effusions. Associated bilateral lower lobe passive atelectasis. No pneumothorax. Upper Abdomen: No acute abnormality.  Splenule noted. Musculoskeletal: No chest wall abnormality. No suspicious lytic or blastic osseous lesions. No acute displaced fracture. Old healed right rib fractures. Plate and screw  fixation of an old healed right clavicular fracture. Review of the MIP images confirms the above findings. IMPRESSION: 1. No pulmonary embolus with slightly limited evaluation of the subsegmental level due to motion artifact and atelectasis. 2. Trace pericardial effusion and trace bilateral pleural effusions, left greater than right. 3. Prominent right cardiac ventricle. 4. Aortic Atherosclerosis (ICD10-I70.0) with at least 2 vessel coronary calcification. Electronically Signed   By: Iven Finn M.D.   On: 05/11/2022 23:10   CT Head Wo Contrast  Addendum Date: 05/11/2022   ADDENDUM REPORT: 05/11/2022 22:14 ADDENDUM: Interval increase in size compared to MRI head 06/30/2019 right scalp 14 x 10 mm cm subcutaneus soft tissue lesion with associated calcifications (new from 2012). Recommend correlation with physical exam. These results were called by telephone at the time of interpretation on 05/11/2022 at 10:14 pm to provider [facility] , who verbally acknowledged these results. Electronically Signed   By: Iven Finn M.D.   On: 05/11/2022 22:14   Result Date: 05/11/2022 CLINICAL DATA:  Neuro deficit, acute, stroke suspected EXAM: CT HEAD WITHOUT CONTRAST TECHNIQUE: Contiguous axial images were  obtained from the base of the skull through the vertex without intravenous contrast. RADIATION DOSE REDUCTION: This exam was performed according to the departmental dose-optimization program which includes automated exposure control, adjustment of the mA and/or kV according to patient size and/or use of iterative reconstruction technique. COMPARISON:  MRI head 06/30/2019 BRAIN: BRAIN Cerebral ventricle sizes are concordant with the degree of cerebral volume loss. Patchy and confluent areas of decreased attenuation are noted throughout the deep and periventricular white matter of the cerebral hemispheres bilaterally, compatible with chronic microvascular ischemic disease. Chronic left centrum semiovale lacunar infarction. Chronic right basal ganglia lacunar infarction. No evidence of large-territorial acute infarction. No parenchymal hemorrhage. No mass lesion. No extra-axial collection. No mass effect or midline shift. No hydrocephalus. Basilar cisterns are patent. Vascular: No hyperdense vessel. Atherosclerotic calcifications are present within the cavernous internal carotid and vertebral arteries. Skull: No acute fracture or focal lesion. Sinuses/Orbits: Paranasal sinuses and mastoid air cells are clear. The orbits are unremarkable. Other: None. IMPRESSION: No acute intracranial abnormality. Electronically Signed: By: Iven Finn M.D. On: 05/11/2022 18:47   CT ABDOMEN PELVIS W CONTRAST  Result Date: 05/11/2022 CLINICAL DATA:  Abdominal pain, acute, nonlocalized EXAM: CT ABDOMEN AND PELVIS WITH CONTRAST TECHNIQUE: Multidetector CT imaging of the abdomen and pelvis was performed using the standard protocol following bolus administration of intravenous contrast. RADIATION DOSE REDUCTION: This exam was performed according to the departmental dose-optimization program which includes automated exposure control, adjustment of the mA and/or kV according to patient size and/or use of iterative reconstruction  technique. CONTRAST:  109mL OMNIPAQUE IOHEXOL 300 MG/ML  SOLN COMPARISON:  None Available. FINDINGS: Lower chest: Elevated left hemidiaphragm. Posterior right lower lobe dependent passive atelectasis. Coronary calcifications. Hepatobiliary: No focal liver abnormality. Contracted gallbladder. No gallstones, gallbladder wall thickening, or pericholecystic fluid. No biliary dilatation. Pancreas: No focal lesion. Normal pancreatic contour. No surrounding inflammatory changes. No main pancreatic ductal dilatation. Spleen: Normal in size without focal abnormality.  Splenule noted. Adrenals/Urinary Tract: No adrenal nodule bilaterally. Bilateral kidneys enhance symmetrically. No hydronephrosis. No hydroureter. The urinary bladder is unremarkable. Stomach/Bowel: Stomach is within normal limits. No evidence of bowel wall thickening or dilatation. Scattered colonic diverticulosis. The appendix is not definitely identified with no inflammatory changes in the right lower quadrant to suggest acute appendicitis. Vascular/Lymphatic: No abdominal aorta or iliac aneurysm. Severe atherosclerotic plaque of the aorta and its branches.  No abdominal, pelvic, or inguinal lymphadenopathy. Reproductive: The prostate measures mildly enlarged (5 cm). Other: No intraperitoneal free fluid. No intraperitoneal free gas. No organized fluid collection. Musculoskeletal: No abdominal wall hernia or abnormality. No suspicious lytic or blastic osseous lesions. No acute displaced fracture. IMPRESSION: 1. No acute intra-abdominal or intrapelvic abnormality. 2. Scattered colonic diverticulosis with no acute diverticulitis. 3. Prostatomegaly. 4. Aortic Atherosclerosis (ICD10-I70.0) - severe. Electronically Signed   By: Iven Finn M.D.   On: 05/11/2022 18:58   DG Chest Port 1 View  Result Date: 05/11/2022 CLINICAL DATA:  Weakness for a week. EXAM: PORTABLE CHEST 1 VIEW COMPARISON:  May 14, 2021 FINDINGS: Stable cardiomegaly. The hila and mediastinum  are unremarkable. No pneumothorax. No nodules or masses. No focal infiltrates. Healed right rib fractures. Repair of right clavicular fracture. IMPRESSION: No active disease. Electronically Signed   By: Dorise Bullion III M.D.   On: 05/11/2022 18:03    Recent Labs: Lab Results  Component Value Date   WBC 10.6 (H) 05/15/2022   HGB 9.9 (L) 05/15/2022   PLT 332 05/15/2022   NA 138 05/15/2022   K 4.4 05/15/2022   CL 106 05/15/2022   CO2 22 05/15/2022   GLUCOSE 125 (H) 05/15/2022   BUN 37 (H) 05/15/2022   CREATININE 1.31 (H) 05/15/2022   BILITOT 0.9 05/12/2022   ALKPHOS 44 05/12/2022   AST 16 05/12/2022   ALT 13 05/12/2022   PROT 5.7 (L) 05/12/2022   ALBUMIN 2.8 (L) 05/12/2022   CALCIUM 8.6 (L) 05/15/2022   GFRAA 75 11/28/2020   QFTBGOLDPLUS NEGATIVE 04/19/2022    Speciality Comments: No specialty comments available.  Procedures:  No procedures performed Allergies: Methotrexate, Nsaids, Plaquenil [hydroxychloroquine], Azulfidine [sulfasalazine], Crestor [rosuvastatin], Isosorbide, Mevacor [lovastatin], and Pravachol [pravastatin]   Assessment / Plan:     Visit Diagnoses: Acute pericarditis, unspecified type  Episode with pericarditis and joint inflammation of multiple sites does not look suspicious to be coming from RA activity.  No clear provoking incident may have had some viral illness with the reported chills at the onset.  Today looks pretty good but has been recently treated with additional anti-inflammatory medications.  The previously discussed Actemra treatment should be appropriate for controlling RA is not specifically data for this in cases of idiopathic pericarditis.  High risk medication use  Had some cytopenias and multiple abnormal labs at the hospital.  Plan to recheck his inflammatory markers I suspect they are much improved now also rechecking the CBC and metabolic panel.  He has upcoming follow-up with cardiology from this hospitalization.  I do not see any  specific contraindication to starting the Actemra but would wait until has not follow-up in case any additional evaluation needed.  If cytopenias are much worse maybe need to wait until coming off colchicine.  Orders: No orders of the defined types were placed in this encounter.  No orders of the defined types were placed in this encounter.    Follow-Up Instructions: No follow-ups on file.   Collier Salina, MD  Note - This record has been created using [employer].  Chart creation errors have been sought, but may not always  have been located. Such creation errors do not reflect on  the standard of medical care.

## 2022-05-30 ENCOUNTER — Ambulatory Visit: Payer: PPO | Admitting: Internal Medicine

## 2022-05-30 ENCOUNTER — Encounter: Payer: Self-pay | Admitting: Internal Medicine

## 2022-05-30 VITALS — BP 152/71 | HR 50 | Resp 13 | Ht 67.0 in | Wt 153.2 lb

## 2022-05-30 DIAGNOSIS — M069 Rheumatoid arthritis, unspecified: Secondary | ICD-10-CM | POA: Diagnosis not present

## 2022-05-30 DIAGNOSIS — Z79899 Other long term (current) drug therapy: Secondary | ICD-10-CM | POA: Diagnosis not present

## 2022-05-30 DIAGNOSIS — I309 Acute pericarditis, unspecified: Secondary | ICD-10-CM

## 2022-05-31 ENCOUNTER — Telehealth: Payer: Self-pay | Admitting: Family Medicine

## 2022-05-31 LAB — C-REACTIVE PROTEIN: CRP: 0.7 mg/L (ref <8.0)

## 2022-05-31 LAB — CBC WITH DIFFERENTIAL/PLATELET
Absolute Monocytes: 561 cells/uL (ref 200–950)
Basophils Absolute: 28 cells/uL (ref 0–200)
Basophils Relative: 0.4 %
Eosinophils Absolute: 50 cells/uL (ref 15–500)
Eosinophils Relative: 0.7 %
HCT: 34.7 % — ABNORMAL LOW (ref 38.5–50.0)
Hemoglobin: 11.3 g/dL — ABNORMAL LOW (ref 13.2–17.1)
Lymphs Abs: 241 cells/uL — ABNORMAL LOW (ref 850–3900)
MCH: 30.1 pg (ref 27.0–33.0)
MCHC: 32.6 g/dL (ref 32.0–36.0)
MCV: 92.3 fL (ref 80.0–100.0)
MPV: 12 fL (ref 7.5–12.5)
Monocytes Relative: 7.9 %
Neutro Abs: 6220 cells/uL (ref 1500–7800)
Neutrophils Relative %: 87.6 %
Platelets: 224 10*3/uL (ref 140–400)
RBC: 3.76 10*6/uL — ABNORMAL LOW (ref 4.20–5.80)
RDW: 13.1 % (ref 11.0–15.0)
Total Lymphocyte: 3.4 %
WBC: 7.1 10*3/uL (ref 3.8–10.8)

## 2022-05-31 LAB — COMPLETE METABOLIC PANEL WITH GFR
AG Ratio: 2 (calc) (ref 1.0–2.5)
ALT: 23 U/L (ref 9–46)
AST: 16 U/L (ref 10–35)
Albumin: 4.2 g/dL (ref 3.6–5.1)
Alkaline phosphatase (APISO): 58 U/L (ref 35–144)
BUN/Creatinine Ratio: 22 (calc) (ref 6–22)
BUN: 31 mg/dL — ABNORMAL HIGH (ref 7–25)
CO2: 27 mmol/L (ref 20–32)
Calcium: 9.3 mg/dL (ref 8.6–10.3)
Chloride: 105 mmol/L (ref 98–110)
Creat: 1.41 mg/dL — ABNORMAL HIGH (ref 0.70–1.28)
Globulin: 2.1 g/dL (calc) (ref 1.9–3.7)
Glucose, Bld: 82 mg/dL (ref 65–99)
Potassium: 5.3 mmol/L (ref 3.5–5.3)
Sodium: 139 mmol/L (ref 135–146)
Total Bilirubin: 0.4 mg/dL (ref 0.2–1.2)
Total Protein: 6.3 g/dL (ref 6.1–8.1)
eGFR: 54 mL/min/{1.73_m2} — ABNORMAL LOW (ref >=60)

## 2022-05-31 LAB — SEDIMENTATION RATE: Sed Rate: 2 mm/h (ref 0–20)

## 2022-06-12 ENCOUNTER — Ambulatory Visit (INDEPENDENT_AMBULATORY_CARE_PROVIDER_SITE_OTHER): Payer: PPO | Admitting: Family Medicine

## 2022-06-12 ENCOUNTER — Encounter: Payer: Self-pay | Admitting: Family Medicine

## 2022-06-12 ENCOUNTER — Ambulatory Visit: Payer: PPO | Admitting: Cardiology

## 2022-06-12 VITALS — BP 118/68 | HR 50 | Temp 98.5°F | Ht 67.0 in | Wt 154.5 lb

## 2022-06-12 DIAGNOSIS — D649 Anemia, unspecified: Secondary | ICD-10-CM | POA: Diagnosis not present

## 2022-06-12 DIAGNOSIS — R6889 Other general symptoms and signs: Secondary | ICD-10-CM | POA: Diagnosis not present

## 2022-06-12 DIAGNOSIS — G252 Other specified forms of tremor: Secondary | ICD-10-CM

## 2022-06-12 LAB — B12 AND FOLATE PANEL
Folate: 11.7 ng/mL (ref >5.9)
Vitamin B-12: 164 pg/mL — ABNORMAL LOW (ref 211–911)

## 2022-06-12 LAB — IBC + FERRITIN
Ferritin: 48 ng/mL (ref 22.0–322.0)
Iron: 119 ug/dL (ref 42–165)
Saturation Ratios: 31.3 % (ref 20.0–50.0)
TIBC: 380.8 ug/dL (ref 250.0–450.0)
Transferrin: 272 mg/dL (ref 212.0–360.0)

## 2022-06-12 LAB — TSH: TSH: 1.27 u[IU]/mL (ref 0.35–5.50)

## 2022-06-12 MED ORDER — TOPIRAMATE 25 MG PO TABS: ORAL_TABLET | ORAL | 1 refills | Status: DC

## 2022-06-12 MED ORDER — METOPROLOL SUCCINATE ER 50 MG PO TB24
50.0000 mg | ORAL_TABLET | Freq: Every day | ORAL | 2 refills | Status: DC
Start: 2022-06-12 — End: 2022-09-16

## 2022-06-12 NOTE — Patient Instructions (Signed)
Let me know if there are cost issues.  Keep the diet clean and stay active.  We will reach out to the Eliquis rep to see if there is anything we can do to help.  Let us know if you need anything.

## 2022-06-12 NOTE — Progress Notes (Signed)
Chief Complaint  Patient presents with   Follow-up    Tremor has worsened Medication problem Hemoglobin low Refill metoprolol    Subjective: Patient is a 71 y.o. male here for f/u.  Patient needs a refill of metoprolol.  He has had a tremor worsened with movement or writing that has been worsening over the past 2 months.  It initially started around 2 years ago.  It is mainly his right hand/forearm.  He is not dropping things but is very difficult to write.  It is affecting his activities of daily living enough to consider taking medication.  The patient is on Eliquis for atrial fibrillation.  He takes 5 mg twice daily and runs out tomorrow.  He is approaching his donut hole quickly as he has medical issues requiring multiple medications.  He cannot afford this and his patient assistance was denied.  His rheumatologist just drew labs in anticipation of starting a new medication.  His hemoglobin was slightly low and he was told to have it taken care of prior to starting the medication.  No bleeding or easy bruising.  Past Medical History:  Diagnosis Date   Abdominal aortic atherosclerosis (HCC)    Anemia    likely of chronic disease   Angina pectoris (HCC) 08/30/2020   Arthritis    Atrial fibrillation (HCC)    Barrett's esophagus without dysplasia 03/25/2019   Formatting of this note might be different from the original. EGD done in 02/2019. Due in 3 years.   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 Last Assessment & Plan:  Well controlled. Continue current medications.   Gastroesophageal reflux disease without esophagitis 08/16/2014   Formatting of this note might be different from the original. Last Assessment & Plan:  Well controlled. Continue current medications.   Herpes gingivostomatitis 02/10/2015   History of colon polyps 03/25/2019   Formatting of this note might be different from the original. tubular adenoma   History of stroke 11/10/2019   Hyperlipidemia    Hypertension    Ingrowing nail 03/12/2020   Labile hypertension 08/16/2014   Formatting of this note might be different from the original. Last Assessment & Plan:  Slightly above goal today secondary to pain. Will continue current regimen. Will check CMP today.   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 Concern over possible lichen planus. His dentist noticed lesions of his oral mucosa.  It was felt to be lichen planus.  He presents to discuss further.  Rarely does the area hurt.He smoked in the distant past EXAM shows several white patches on the buccal mucosa bilaterally.  No other concerning oral mucosal les   Long-term use of immunosuppressant medication 03/23/2018   Low testosterone  09/17/2016   Midline thoracic back pain 02/10/2015   Mixed hyperlipidemia 08/16/2014   Normochromic normocytic anemia 08/16/2014   PAD (peripheral artery disease) (HCC) 03/23/2019   Pain of right hip joint 01/19/2016   Rheumatic fever    Rheumatoid arthritis (HCC) 10/29/2018   Formatting of this note might be different from the original. Rheum:  Dr. Sharmon Revere Formerly on MTX - stopped due to mouth sores. Currently started  on Arava.  Also on 5 mg prednisone x 3 months during run-in.   Right-sided chest wall pain 04/17/2019   Seasonal allergies 03/23/2018   Stage 3a chronic kidney disease (HCC) 07/21/2019   Statin intolerance 07/31/2020   Thoracic radiculopathy 06/16/2019   Tremor 06/16/2019   Vitamin B12 deficiency    Injections   Vitamin D deficiency 09/17/2016    Objective: BP 118/68   Pulse (!) 50   Temp 98.5 F (36.9 C) (Oral)   Ht 5\' 7"  (1.702 m)   Wt 154 lb 8 oz (70.1 kg)   SpO2 99%   BMI 24.20 kg/m  General: Awake, appears stated age Heart: Bradycardic, regular rhythm, no LE edema Lungs: CTAB, no rales, wheezes or rhonchi. No accessory muscle use MSK: No asymmetry Neuro: No resting tremor, tremor noted bilaterally with testing grip strength, gait is slow and cautious Psych: Age appropriate judgment and insight, normal affect and mood  Assessment and Plan: Normocytic anemia - Plan: IBC + Ferritin, Pathologist smear review, B12 and Folate Panel, CBC, CANCELED: CBC  Intention tremor - Plan: topiramate (TOPAMAX) 25 MG tablet  Cold intolerance - Plan: TSH  Check the above labs. He is on metoprolol for CAD and atrial fibrillation.  He is on Eliquis which makes primidone less ideal.  We will trial Topamax titrating up to 50 mg twice daily and rechecking in 1 month. Check TSH. The patient voiced understanding and agreement to the plan.  Winter, DO 06/12/22  12:26 PM

## 2022-06-13 LAB — CBC
HCT: 33.9 % — ABNORMAL LOW (ref 38.5–50.0)
Hemoglobin: 11.2 g/dL — ABNORMAL LOW (ref 13.2–17.1)
MCH: 30.2 pg (ref 27.0–33.0)
MCHC: 33 g/dL (ref 32.0–36.0)
MCV: 91.4 fL (ref 80.0–100.0)
MPV: 11.7 fL (ref 7.5–12.5)
Platelets: 223 10*3/uL (ref 140–400)
RBC: 3.71 10*6/uL — ABNORMAL LOW (ref 4.20–5.80)
RDW: 13.5 % (ref 11.0–15.0)
WBC: 4.1 10*3/uL (ref 3.8–10.8)

## 2022-06-13 LAB — PATHOLOGIST SMEAR REVIEW

## 2022-06-14 ENCOUNTER — Other Ambulatory Visit: Payer: PPO

## 2022-06-14 ENCOUNTER — Other Ambulatory Visit: Payer: Self-pay | Admitting: Family Medicine

## 2022-06-14 DIAGNOSIS — D649 Anemia, unspecified: Secondary | ICD-10-CM | POA: Diagnosis not present

## 2022-06-14 DIAGNOSIS — F3170 Bipolar disorder, currently in remission, most recent episode unspecified: Secondary | ICD-10-CM | POA: Diagnosis not present

## 2022-06-14 DIAGNOSIS — I480 Paroxysmal atrial fibrillation: Secondary | ICD-10-CM | POA: Diagnosis not present

## 2022-06-14 DIAGNOSIS — F411 Generalized anxiety disorder: Secondary | ICD-10-CM | POA: Diagnosis not present

## 2022-06-14 DIAGNOSIS — I10 Essential (primary) hypertension: Secondary | ICD-10-CM | POA: Diagnosis not present

## 2022-06-14 DIAGNOSIS — I32 Pericarditis in diseases classified elsewhere: Secondary | ICD-10-CM | POA: Diagnosis not present

## 2022-06-14 DIAGNOSIS — I2510 Atherosclerotic heart disease of native coronary artery without angina pectoris: Secondary | ICD-10-CM | POA: Diagnosis not present

## 2022-06-14 DIAGNOSIS — I214 Non-ST elevation (NSTEMI) myocardial infarction: Secondary | ICD-10-CM | POA: Diagnosis not present

## 2022-06-14 DIAGNOSIS — A419 Sepsis, unspecified organism: Secondary | ICD-10-CM | POA: Diagnosis not present

## 2022-06-14 DIAGNOSIS — M069 Rheumatoid arthritis, unspecified: Secondary | ICD-10-CM | POA: Diagnosis not present

## 2022-06-14 DIAGNOSIS — N1831 Chronic kidney disease, stage 3a: Secondary | ICD-10-CM | POA: Diagnosis not present

## 2022-06-16 LAB — INTRINSIC FACTOR ANTIBODIES: Intrinsic Factor: NEGATIVE

## 2022-06-19 ENCOUNTER — Ambulatory Visit: Payer: PPO | Admitting: Physician Assistant

## 2022-06-19 ENCOUNTER — Encounter: Payer: Self-pay | Admitting: Physician Assistant

## 2022-06-19 VITALS — BP 130/50 | HR 54 | Ht 67.0 in | Wt 157.0 lb

## 2022-06-19 DIAGNOSIS — I700 Atherosclerosis of aorta: Secondary | ICD-10-CM | POA: Diagnosis not present

## 2022-06-19 DIAGNOSIS — I25119 Atherosclerotic heart disease of native coronary artery with unspecified angina pectoris: Secondary | ICD-10-CM

## 2022-06-19 DIAGNOSIS — I10 Essential (primary) hypertension: Secondary | ICD-10-CM | POA: Diagnosis not present

## 2022-06-19 DIAGNOSIS — R011 Cardiac murmur, unspecified: Secondary | ICD-10-CM

## 2022-06-19 DIAGNOSIS — I319 Disease of pericardium, unspecified: Secondary | ICD-10-CM | POA: Diagnosis not present

## 2022-06-19 DIAGNOSIS — E782 Mixed hyperlipidemia: Secondary | ICD-10-CM

## 2022-06-19 NOTE — Patient Instructions (Signed)
Medication Instructions:   Your physician recommends that you continue on your current medications as directed. Please refer to the Current Medication list given to you today.   *If you need a refill on your cardiac medications before your next appointment, please call your pharmacy*   Lab Work:  None ordered.  If you have labs (blood work) drawn today and your tests are completely normal, you will receive your results only by: MyChart Message (if you have MyChart) OR A paper copy in the mail If you have any lab test that is abnormal or we need to change your treatment, we will call you to review the results.   Testing/Procedures:  None ordered.   Follow-Up: At [facility], you and your health needs are our priority.  As part of our continuing mission to provide you with exceptional heart care, we have created designated Provider Care Teams.  These Care Teams include your primary Cardiologist (physician) and Advanced Practice Providers (APPs -  Physician Assistants and Nurse Practitioners) who all work together to provide you with the care you need, when you need it.  We recommend signing up for the patient portal called "MyChart".  Sign up information is provided on this After Visit Summary.  MyChart is used to connect with patients for Virtual Visits (Telemedicine).  Patients are able to view lab/test results, encounter notes, upcoming appointments, etc.  Non-urgent messages can be sent to your provider as well.   To learn more about what you can do with MyChart, go to [email or web address].    Your next appointment:   2 month(s)  The format for your next appointment:   In Person  Provider:   Belva Crome, MD     Important Information About Sugar

## 2022-06-19 NOTE — Progress Notes (Signed)
Cardiology Office Note:    Date:  06/19/2022   ID:  Noah Grant, DOB [birth year], MRN [id number]  PCP:  Sharlene Dory, DO  [facility] Cardiologist:  Garwin Brothers, MD  [facility] Electrophysiologist:  None   Chief Complaint: Hospital follow up  History of Present Illness:    Noah Grant is a 71 y.o. male with a hx of  CAD s/p DES x2 to rPDA (08/2020), HTN, HLD, CKDIII, RA, bipolar disorder type 1, GERD, GAD, BPH, TIA x 3  and vit B12 deficiency  seen for hospital follow up.   Admitted 05/2022 for seizure like activity who found to have SIRs.  Seen by cardiology for elevated trop and pleuritic chest pain. CMR showed midwall basal inferolateral LGE and elevated T2 c/w myocarditis. Plan to continue colchicine for 3 months. Discharged on taper dose of steroids. Echo with normal LVEF. No pericardial effusion. Felt due to possible RA flare. Also found to be PAF. Started on Elquis and BB. Stopped Plavix.   PCP placed on Topirmate for worsening Tremors.   Patient is here for follow-up.  He is found to have low white blood cell.  Working with PCP to find etiology.  Currently is not on any RA medication.  Is seeing rheumatologist in the next week and plan to start new medication.  He denies chest pain, shortness of breath, orthopnea, PND, syncope, lower extremity edema or melena.  Compliant with medication.  Past Medical History:  Diagnosis Date   Abdominal aortic atherosclerosis (HCC)    Anemia    likely of chronic disease   Angina pectoris (HCC) 08/30/2020   Arthritis    Atrial fibrillation (HCC)    Barrett's esophagus without dysplasia 03/25/2019   Formatting of this note might be different from the original. EGD done in 02/2019. Due in 3 years.   Bipolar 1 disorder (HCC)    Bipolar 1 disorder, mixed, moderate (HCC) 08/16/2014   Bipolar affective disorder, currently depressed, moderate (HCC) 03/08/2020   Bipolar depression (HCC) 08/16/2014   BPH with urinary  obstruction 12/29/2018   Brain ischemia 03/08/2020   Cardiac murmur 08/30/2020   Chest pain 09/06/2020   Chicken pox    Chronic bronchitis (HCC)    Cognitive complaints 03/08/2020   Coronary artery disease involving native coronary artery of native heart with angina pectoris (HCC) 09/06/2020   Depression    Disc degeneration, 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   Drug therapy 05/18/2021   Essential hypertension    GAD (generalized anxiety disorder) 11/02/2018   Gastroesophageal reflux disease 08/16/2014   Formatting of this note might be different from the original. Last Assessment & Plan:  Well controlled. Continue current medications.   Gastroesophageal reflux disease without esophagitis 08/16/2014   Formatting of this note might be different from the original. Last Assessment & Plan:  Well controlled. Continue current medications.   Herpes gingivostomatitis 02/10/2015   History of colon polyps 03/25/2019   Formatting of this note might be different from the original. tubular adenoma   History of stroke 11/10/2019   Hyperlipidemia    Hypertension    Ingrowing nail 03/12/2020   Labile hypertension 08/16/2014   Formatting of this note might be different from the original. Last Assessment & Plan:  Slightly above goal today secondary to pain. Will continue current regimen. Will check CMP today.   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 Concern over possible lichen planus. His  dentist noticed lesions of his oral mucosa.  It was felt to be lichen planus.  He presents to discuss further.  Rarely does the area hurt.He smoked in the distant past EXAM shows several white patches on the buccal mucosa bilaterally.  No other concerning oral mucosal les   Long-term use of immunosuppressant medication 03/23/2018   Low testosterone 09/17/2016   Midline thoracic back pain 02/10/2015   Mixed hyperlipidemia 08/16/2014   Normochromic normocytic  anemia 08/16/2014   PAD (peripheral artery disease) (HCC) 03/23/2019   Pain of right hip joint 01/19/2016   Rheumatic fever    Rheumatoid arthritis (HCC) 10/29/2018   Formatting of this note might be different from the original. Rheum:  Dr. Sharmon Revere Formerly on MTX - stopped due to mouth sores. Currently started on Arava.  Also on 5 mg prednisone x 3 months during run-in.   Right-sided chest wall pain 04/17/2019   Seasonal allergies 03/23/2018   Stage 3a chronic kidney disease (HCC) 07/21/2019   Statin intolerance 07/31/2020   Thoracic radiculopathy 06/16/2019   Tremor 06/16/2019   Vitamin B12 deficiency    Injections   Vitamin D deficiency 09/17/2016    Past Surgical History:  Procedure Laterality Date   CLAVICLE SURGERY     Right, Hardware placed   CORONARY STENT INTERVENTION N/A 09/06/2020   Procedure: CORONARY STENT INTERVENTION;  Surgeon: Tonny Bollman, MD;  Location: [facility] INVASIVE CV LAB;  Service: Cardiovascular;  Laterality: N/A;   LEFT HEART CATH AND CORONARY ANGIOGRAPHY N/A 09/06/2020   Procedure: LEFT HEART CATH AND CORONARY ANGIOGRAPHY;  Surgeon: Tonny Bollman, MD;  Location: [facility] INVASIVE CV LAB;  Service: Cardiovascular;  Laterality: N/A;   WISDOM TOOTH EXTRACTION      Current Medications: Current Meds  Medication Sig   amLODipine (NORVASC) 10 MG tablet Take 1 tablet (10 mg total) by mouth daily.   apixaban (ELIQUIS) 5 MG TABS tablet Take 1 tablet (5 mg total) by mouth 2 (two) times daily.   Cholecalciferol 50 MCG (2000 UT) CAPS Take 2,000 Units by mouth daily.    Coenzyme Q10 (COQ10) 200 MG CAPS Take 200 mg by mouth daily.   colchicine 0.6 MG tablet Take 1 tablet (0.6 mg total) by mouth 2 (two) times daily.   diclofenac Sodium (VOLTAREN) 1 % GEL Apply 1 application. topically daily as needed.   ezetimibe (ZETIA) 10 MG tablet TAKE ONE TABLET BY MOUTH DAILY   hydrALAZINE (APRESOLINE) 50 MG tablet Take 50 mg by mouth 3 (three) times daily.   lithium carbonate 150 MG  capsule TAKE THREE CAPSULES BY MOUTH DAILY   losartan (COZAAR) 100 MG tablet Take 100 mg by mouth daily.   Lysine HCl 1000 MG TABS Take 1,000 mg by mouth 2 (two) times daily.    metoprolol succinate (TOPROL-XL) 50 MG 24 hr tablet Take 1 tablet (50 mg total) by mouth daily. Take with or immediately following a meal.   nitroGLYCERIN (NITROSTAT) 0.4 MG SL tablet Place 1 tablet (0.4 mg total) under the tongue every 5 (five) minutes as needed for chest pain.   pantoprazole (PROTONIX) 40 MG tablet Take 1 tablet (40 mg total) by mouth 2 (two) times daily.   simvastatin (ZOCOR) 5 MG tablet Take 1 tablet (5 mg total) by mouth daily.   vitamin B-12 (CYANOCOBALAMIN) 1000 MCG tablet Take 1,000 mcg by mouth daily.   [DISCONTINUED] topiramate (TOPAMAX) 25 MG tablet Take 1 tab/dayfor 3 days then 1 tab 2x/day for 3 days. Add a pill in AM and  then PM over 3 day intervals until taking 2 pills 2x/day.     Allergies:   Methotrexate, Nsaids, Plaquenil [hydroxychloroquine], Azulfidine [sulfasalazine], Crestor [rosuvastatin], Isosorbide, Mevacor [lovastatin], and Pravachol [pravastatin]   Social History   Socioeconomic History   Marital status: Married    Spouse name: Not on file   Number of children: Not on file   Years of education: Not on file   Highest education level: Not on file  Occupational History   Not on file  Tobacco Use   Smoking status: Former    Packs/day: 2.00    Years: 10.00    Total pack years: 20.00    Types: Cigarettes    Quit date: 84    Years since quitting: 49.5   Smokeless tobacco: Never   Tobacco comments:    Quit>40 years  Vaping Use   Vaping Use: Never used  Substance and Sexual Activity   Alcohol use: Not Currently    Alcohol/week: 0.0 standard drinks of alcohol   Drug use: Never   Sexual activity: Not on file  Other Topics Concern   Not on file  Social History Narrative   Not on file   Social Determinants of Health   Financial Resource Strain: Not on file   Food Insecurity: Not on file  Transportation Needs: Not on file  Physical Activity: Not on file  Stress: Not on file  Social Connections: Not on file     Family History: The patient's family history includes Alcoholism in his brother and father; Alzheimer's disease (age of onset: 38) in his mother; Heart disease (age of onset: 38) in his paternal grandfather; Heart disease (age of onset: 68) in his maternal grandfather; Hypertension in his father and son; Stroke (age of onset: 43) in his father.    ROS:   Please see the history of present illness.    All other systems reviewed and are negative.   EKGs/Labs/Other Studies Reviewed:    The following studies were reviewed today:  Echo 05/2022 1. Left ventricular ejection fraction, by estimation, is 60 to 65%. The  left ventricle has normal function. The left ventricle has no regional  wall motion abnormalities. Left ventricular diastolic parameters are  consistent with Grade II diastolic  dysfunction (pseudonormalization). Elevated left atrial pressure.   2. Right ventricular systolic function is normal. The right ventricular  size is normal. There is mildly elevated pulmonary artery systolic  pressure. The estimated right ventricular systolic pressure is [id number] mmHg.   3. Left atrial size was mildly dilated.   4. The mitral valve is normal in structure. Mild mitral valve  regurgitation. No evidence of mitral stenosis.   5. The aortic valve is tricuspid. There is mild calcification of the  aortic valve. Aortic valve regurgitation is not visualized. Aortic valve  sclerosis is present, with no evidence of aortic valve stenosis.   6. The inferior vena cava is normal in size with greater than 50%  respiratory variability, suggesting right atrial pressure of 3 mmHg.   Comparison(s): No significant change from prior study. Prior images  reviewed side by side.   MR Cardiac 05/2022 IMPRESSION: 1. Findings suggestive of acute myocarditis,  with elevated myocardial T2 values and basal inferolateral midwall late gadolinium enhancement, which is a scar pattern commonly seen in myocarditis   2.  Normal LV size and systolic function (EF [id number])   3.  Normal RV size and systolic function (EF [id number])   4.  Moderate left and small right pleural effusions  CORONARY STENT INTERVENTION  08/2020  LEFT HEART CATH AND CORONARY ANGIOGRAPHY   Conclusion  1.  Severe single-vessel coronary artery disease involving the right PDA branch, treated successfully with overlapping drug-eluting stents, 0% residual stenosis 2.  Mild nonobstructive disease in the mid LAD 3.  Normal left main and left circumflex 4.  Known normal LV function by echo assessment with LVEF 60%   Recommendations: Same-day PCI protocol, aspirin and clopidogrel for 6 months without interruption.   EKG:  EKG is ordered today.  The ekg ordered today demonstrates sinus pericardia at rate of 50  Recent Labs: 05/12/2022: Magnesium 2.0 05/30/2022: ALT 23; BUN 31; Creat 1.41; Potassium 5.3; Sodium 139 06/12/2022: Hemoglobin 11.2; Platelets 223; TSH 1.27  Recent Lipid Panel    Component Value Date/Time   CHOL 107 01/02/2022 1050   CHOL 109 11/28/2020 0841   TRIG 193.0 (H) 01/02/2022 1050   HDL 28.90 (L) 01/02/2022 1050   HDL 33 (L) 11/28/2020 0841   CHOLHDL 4 01/02/2022 1050   VLDL 38.6 01/02/2022 1050   LDLCALC 39 01/02/2022 1050   LDLCALC 57 11/28/2020 0841   LDLDIRECT 99.0 12/01/2019 1056     Physical Exam:    VS:  BP (!) 130/50   Pulse (!) 54   Ht 5\' 7"  (1.702 m)   Wt 157 lb (71.2 kg)   SpO2 99%   BMI 24.59 kg/m     Wt Readings from Last 3 Encounters:  06/19/22 157 lb (71.2 kg)  06/12/22 154 lb 8 oz (70.1 kg)  05/30/22 153 lb 3.2 oz (69.5 kg)     GEN:  Well nourished, well developed in no acute distress HEENT: Normal NECK: No JVD; No carotid bruits LYMPHATICS: No lymphadenopathy CARDIAC: RRR, no murmurs, rubs, gallops RESPIRATORY:  Clear to auscultation  without rales, wheezing or rhonchi  ABDOMEN: Soft, non-tender, non-distended MUSCULOSKELETAL:  No edema; No deformity  SKIN: Warm and dry NEUROLOGIC:  Alert and oriented x 3 PSYCHIATRIC:  Normal affect   ASSESSMENT AND PLAN:    Myopericarditis - Felt probably due to RA flare.  No recurrent chest pain.  Continue colchicine for total 3 months.  Continue beta-blocker and ARB.  2. PAF -occurred in setting of inflammatory response.  Unable to find atrial fibrillation strip in system.  He has history of TIA x3 per his report.  We will continue Eliquis.  May consider monitor in future to rule out recurren A-fib.  Plan to stop Eliquis.  Will defer to primary cardiologist.  This patients CHA2DS2-VASc Score and unadjusted Ischemic Stroke Rate (% per year) is equal to 7.2 % stroke rate/year from a score of 5  Above score calculated as 1 point each if present [CHF, HTN, DM, Vascular=MI/PAD/Aortic Plaque, Age if 65-74, or Male] Above score calculated as 2 points each if present [Age > 75, or Stroke/TIA/TE]   3. CAD No angina.  Plavix discontinued in hospital due to need of anticoagulation.  Continue beta-blocker and statin.  4. HTN -Blood pressure stable and controlled on current medication  5. HLD -Continue statin  Medication Adjustments/Labs and Tests Ordered: Current medicines are reviewed at length with the patient today.  Concerns regarding medicines are outlined above.  Orders Placed This Encounter  Procedures   EKG 12-Lead   No orders of the defined types were placed in this encounter.   Patient Instructions  Medication Instructions:   Your physician recommends that you continue on your current medications as directed. Please refer to the Current Medication list given  to you today.   *If you need a refill on your cardiac medications before your next appointment, please call your pharmacy*   Lab Work:  None ordered.  If you have labs (blood work) drawn today and your  tests are completely normal, you will receive your results only by: [place] (if you have MyChart) OR A paper copy in the mail If you have any lab test that is abnormal or we need to change your treatment, we will call you to review the results.   Testing/Procedures:  None ordered.   Follow-Up: At [facility], you and your health needs are our priority.  As part of our continuing mission to provide you with exceptional heart care, we have created designated Provider Care Teams.  These Care Teams include your primary Cardiologist (physician) and Advanced Practice Providers (APPs -  Physician Assistants and Nurse Practitioners) who all work together to provide you with the care you need, when you need it.  We recommend signing up for the patient portal called "MyChart".  Sign up information is provided on this After Visit Summary.  MyChart is used to connect with patients for Virtual Visits (Telemedicine).  Patients are able to view lab/test results, encounter notes, upcoming appointments, etc.  Non-urgent messages can be sent to your provider as well.   To learn more about what you can do with MyChart, go to [email or web address].    Your next appointment:   2 month(s)  The format for your next appointment:   In Person  Provider:   Jyl Heinz, MD     Important Information About Sugar         Jarrett Soho, [place]  06/19/2022 3:51 PM    [place]

## 2022-06-24 ENCOUNTER — Telehealth: Payer: Self-pay | Admitting: Pharmacist

## 2022-06-24 DIAGNOSIS — Z5181 Encounter for therapeutic drug level monitoring: Secondary | ICD-10-CM

## 2022-06-24 DIAGNOSIS — Z79899 Other long term (current) drug therapy: Secondary | ICD-10-CM

## 2022-06-24 DIAGNOSIS — M069 Rheumatoid arthritis, unspecified: Secondary | ICD-10-CM

## 2022-06-24 NOTE — Telephone Encounter (Signed)
Patient called requesting Dr. Dimple Casey review his most recent labs that have been completed by Dr. Carmelia Roller. Patient will have repeat CBC on Friday, 06/28/22. He states he is in quite a bit of pain (states it is related to RA at this point since he is off of prednisone now). Requesting advice on if labwork is stable to proceed with starting Actemra. He is still taking colchicine. He is now taking vitamin B-12 by mouth once daily.   He saw cardiology on 06/19/22 and they did not have any concerns regarding patient proceeding with Actemra but wanted patient to f/u with PCP/rheumatology for clearance to proceed.  He received his Actemra shipment on 06/21/22 and has not started.  He is requesting that if he cannot Actemra, can he use prednisone that he has at home. Has prednisone 5mg  tablets at home (about 25-30 tablets).  Routing to Dr. for advisement  Dimple Casey, PharmD, MPH, BCPS, CPP Clinical Pharmacist (Rheumatology and Pulmonology)

## 2022-06-25 NOTE — Telephone Encounter (Signed)
I think he is okay to start Actemra. His lymphocyte count is quite low in 200s at last value I think might be related to his colchicine treatment. Usually neutropenia would be the risk with this Actemra so should be okay. We can monitor this on his future labs.

## 2022-06-26 MED ORDER — ACTEMRA ACTPEN 162 MG/0.9ML ~~LOC~~ SOAJ: 162.0000 mg | SUBCUTANEOUS | 0 refills | Status: DC

## 2022-06-26 NOTE — Telephone Encounter (Signed)
I spoke with patient and advised that he can start Actemra. He states he has been on Humira before and feels comfortable with self-injecting. He states that he does not recall if Dr. Dimple Casey specifically mentioned him coming to the clinic to start.  Reviewed injection site locations (abdomen and thigh). Reviewed importance of holding Actemra with infections, antibiotic use, and procedures/surgeries. Reviewed that medication should be left at room temperature prior to administration.  Reviewed that he will use one pen (162mg ) every 14 days. We reviewed labs.  Patient has f/u with Dr. on 07/31/22 when he can have labs repeated.  08/02/22, PharmD, MPH, BCPS, CPP Clinical Pharmacist (Rheumatology and Pulmonology)

## 2022-06-28 ENCOUNTER — Other Ambulatory Visit (INDEPENDENT_AMBULATORY_CARE_PROVIDER_SITE_OTHER): Payer: PPO

## 2022-06-28 DIAGNOSIS — D649 Anemia, unspecified: Secondary | ICD-10-CM

## 2022-06-28 LAB — CBC
HCT: 32.3 % — ABNORMAL LOW (ref 39.0–52.0)
Hemoglobin: 10.9 g/dL — ABNORMAL LOW (ref 13.0–17.0)
MCHC: 33.6 g/dL (ref 30.0–36.0)
MCV: 90.5 fl (ref 78.0–100.0)
Platelets: 235 10*3/uL (ref 150.0–400.0)
RBC: 3.57 Mil/uL — ABNORMAL LOW (ref 4.22–5.81)
RDW: 15 % (ref 11.5–15.5)
WBC: 7.1 10*3/uL (ref 4.0–10.5)

## 2022-06-28 LAB — VITAMIN B12: Vitamin B-12: 387 pg/mL (ref 211–911)

## 2022-07-02 ENCOUNTER — Encounter: Payer: PPO | Admitting: Family Medicine

## 2022-07-04 ENCOUNTER — Encounter: Payer: PPO | Admitting: Family Medicine

## 2022-07-05 ENCOUNTER — Telehealth: Payer: Self-pay | Admitting: Family Medicine

## 2022-07-05 MED ORDER — APIXABAN 5 MG PO TABS: 5.0000 mg | ORAL_TABLET | Freq: Two times a day (BID) | ORAL | 3 refills | Status: DC

## 2022-07-05 NOTE — Telephone Encounter (Signed)
Patient informed sent in. 

## 2022-07-05 NOTE — Telephone Encounter (Signed)
Pt dropped off letter for CMA to have requesting meds (letter in a small white envelope). Document given to CMA.

## 2022-07-05 NOTE — Telephone Encounter (Signed)
Patient will run out of Eliquis on 07/12/22. No new prescription.  He will be in the donut hole issue worked out with his medicare provider.  He would like prescription for Eliquis sent to [facility] on [address].

## 2022-07-09 ENCOUNTER — Ambulatory Visit: Payer: PPO | Admitting: Cardiology

## 2022-07-15 ENCOUNTER — Encounter: Payer: Self-pay | Admitting: Family Medicine

## 2022-07-15 ENCOUNTER — Ambulatory Visit (INDEPENDENT_AMBULATORY_CARE_PROVIDER_SITE_OTHER): Payer: PPO | Admitting: Family Medicine

## 2022-07-15 VITALS — BP 102/62 | HR 43 | Temp 98.8°F | Ht 67.0 in | Wt 161.0 lb

## 2022-07-15 DIAGNOSIS — G252 Other specified forms of tremor: Secondary | ICD-10-CM | POA: Diagnosis not present

## 2022-07-15 MED ORDER — GABAPENTIN 100 MG PO CAPS: 100.0000 mg | ORAL_CAPSULE | Freq: Three times a day (TID) | ORAL | 1 refills | Status: DC

## 2022-07-15 NOTE — Progress Notes (Signed)
Chief Complaint  Patient presents with   Follow-up    Cannot take the medication for tremor    Subjective: Patient is a 71 y.o. male here for follow-up tremor.  Patient is on a beta-blocker for his heart disease and Eliquis for atrial fibrillation ruling out primidone is a safe option.  He was recently started on Topamax which gave him back pain and did not help his tremor.  He was compliant with it for around 2 weeks prior to stopping.  He still has tremor in both of his hands, most notable when he eats and drinks.  He has no new pain or weakness.  Past Medical History:  Diagnosis Date   Abdominal aortic atherosclerosis (HCC)    Anemia    likely of chronic disease   Angina pectoris (HCC) 08/30/2020   Arthritis    Atrial fibrillation (HCC)    Barrett's esophagus without dysplasia 03/25/2019   Formatting of this note might be different from the original. EGD done in 02/2019. Due in 3 years.   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 Last Assessment & Plan:  Well controlled. Continue current medications.   Gastroesophageal reflux disease without esophagitis 08/16/2014   Formatting of this note might be different from the original. Last Assessment & Plan:  Well  controlled. Continue current medications.   Herpes gingivostomatitis 02/10/2015   History of colon polyps 03/25/2019   Formatting of this note might be different from the original. tubular adenoma   History of stroke 11/10/2019   Hyperlipidemia    Hypertension    Ingrowing nail 03/12/2020   Labile hypertension 08/16/2014   Formatting of this note might be different from the original. Last Assessment & Plan:  Slightly above goal today secondary to pain. Will continue current regimen. Will check CMP today.   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 Concern over possible lichen planus. His dentist noticed lesions of his oral mucosa.  It was felt to be lichen planus.  He presents to discuss further.  Rarely does the area hurt.He smoked in the distant past EXAM shows several white patches on the buccal mucosa bilaterally.  No other concerning oral mucosal les   Long-term use of immunosuppressant medication 03/23/2018   Low testosterone 09/17/2016   Midline thoracic back pain 02/10/2015   Mixed hyperlipidemia 08/16/2014   Normochromic normocytic anemia 08/16/2014   PAD (peripheral artery disease) (HCC) 03/23/2019   Pain of right hip joint 01/19/2016   Rheumatic fever    Rheumatoid arthritis (HCC) 10/29/2018   Formatting of this note might be different from the original. Rheum:  Dr. Sharmon Revere Formerly on MTX - stopped due to mouth sores. Currently started on Arava.  Also on 5  mg prednisone x 3 months during run-in.   Right-sided chest wall pain 04/17/2019   Seasonal allergies 03/23/2018   Stage 3a chronic kidney disease (HCC) 07/21/2019   Statin intolerance 07/31/2020   Thoracic radiculopathy 06/16/2019   Tremor 06/16/2019   Vitamin B12 deficiency    Injections   Vitamin D deficiency 09/17/2016    Objective: BP 102/62   Pulse (!) 43   Temp 98.8 F (37.1 C) (Oral)   Ht 5\' 7"  (1.702 m)   Wt 161 lb (73 kg)   SpO2  95%   BMI 25.22 kg/m  General: Awake, appears stated age Neuro: DTRs equal and symmetric throughout, no clonus, no cerebellar signs, gait is cautious/slow, no resting tremor, tremor is more prominent with movement, 5/5 grip strength Lungs: No accessory muscle use Psych: Age appropriate judgment and insight, normal affect and mood  Assessment and Plan: Intention tremor - Plan: gabapentin (NEURONTIN) 100 MG capsule  Chronic, uncontrolled.  Stop Topamax, start gabapentin 100 mg 3 times daily.  Follow-up in 1 month recheck.  Could consider pregabalin versus referral. The patient voiced understanding and agreement to the plan.  [place], DO 07/15/22  11:55 AM

## 2022-07-15 NOTE — Patient Instructions (Signed)
Take the first gabapentin dosage in the evening to find out if it makes you drowsy.   Stop the Topamax.  Let us know if you need anything.

## 2022-07-21 IMAGING — DX DG CHEST 1V PORT
1 series · 1 of 1 positions shown · non-contrast
Comparison: 04/16/2019

CLINICAL DATA: Cough and fever

EXAM:
PORTABLE CHEST 1 VIEW

[chest ap]
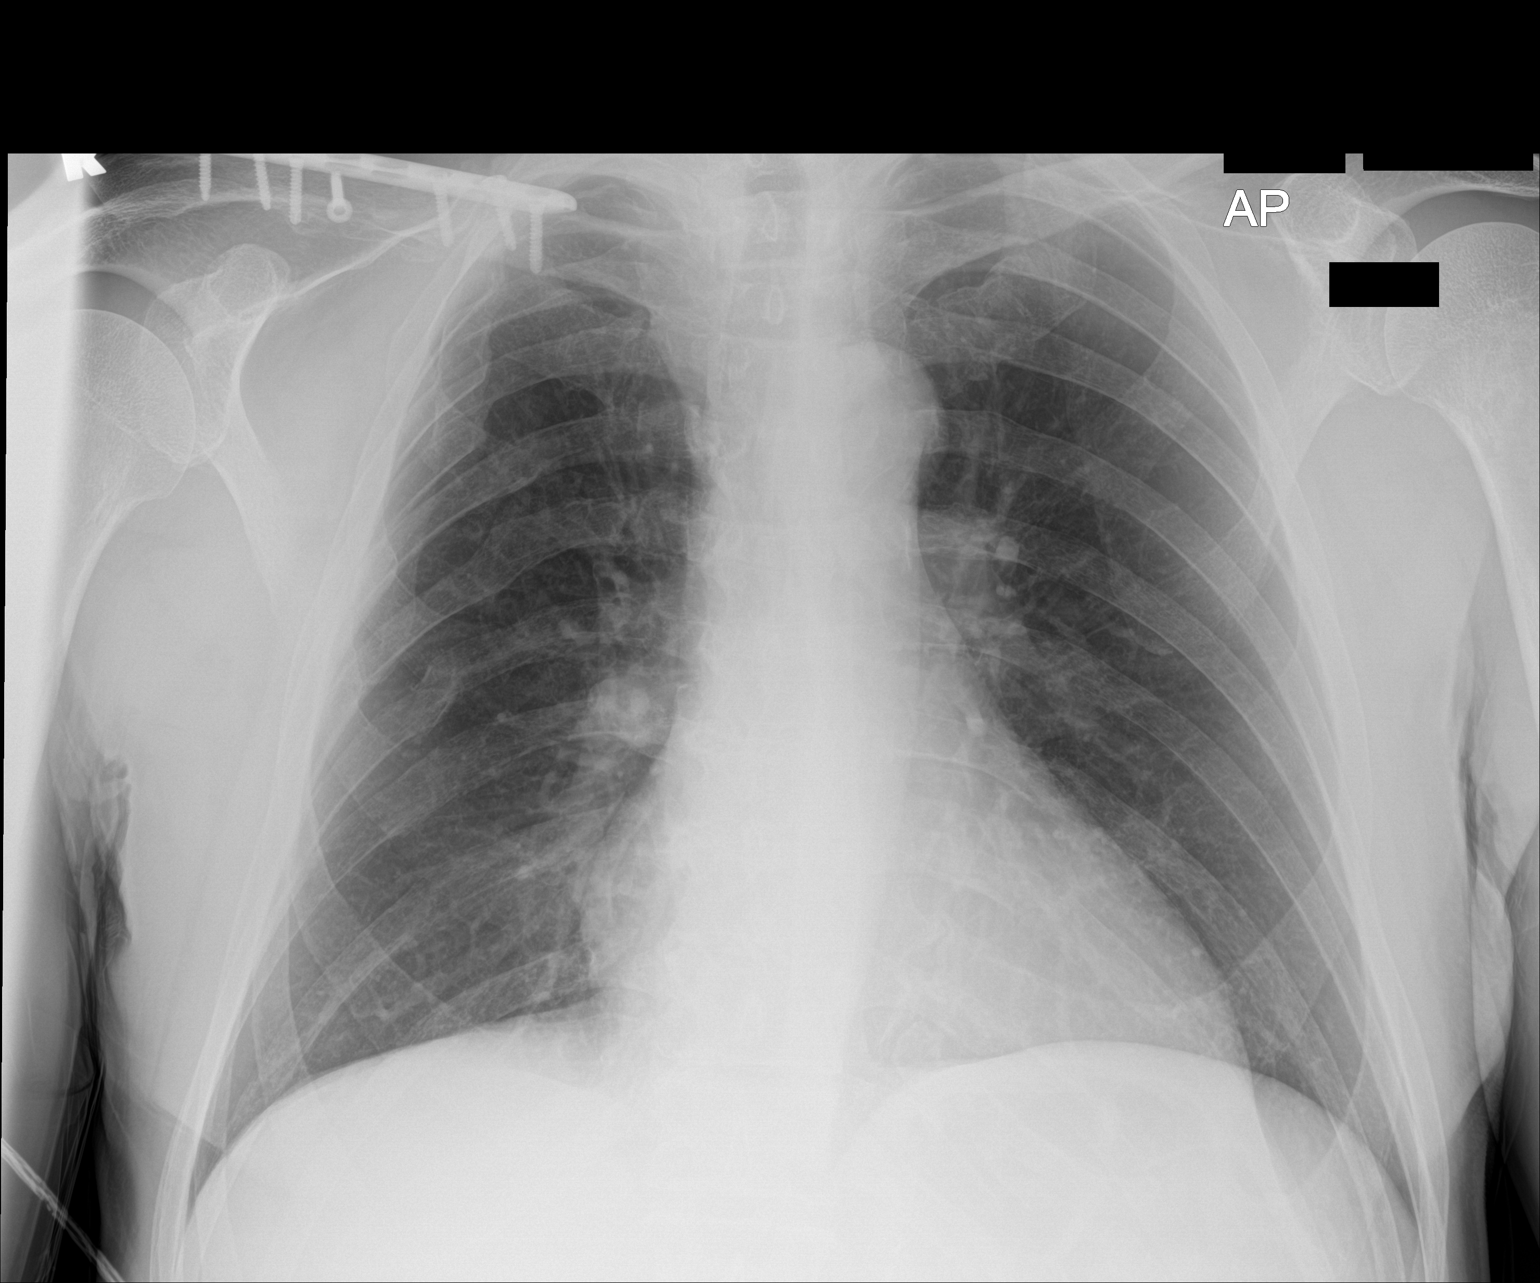

[1 of 1 positions shown; findings below may reference images not displayed]

FINDINGS: Cardiac shadows within normal limits. The lungs are well aerated
bilaterally. Prior right clavicular operative fixation is seen. Old
right rib fractures are noted. No focal infiltrate or sizable
effusion is noted.
IMPRESSION: No acute abnormality noted.

## 2022-07-23 DIAGNOSIS — N1831 Chronic kidney disease, stage 3a: Secondary | ICD-10-CM | POA: Diagnosis not present

## 2022-07-25 NOTE — Progress Notes (Signed)
Office Visit Note  Patient: Noah Grant             Date of Birth: Oct 23, 1951           MRN: [id number]             PCP: [name], DO Referring: [name]* Visit Date: 07/29/2022   Subjective:  Follow-up (Stiffness and pain in shoulders, hips, feet and hand. Swelling in hands and ankles.)   History of Present Illness: Noah Grant is a 71 y.o. male here for follow up or RA suspected associated with acute pericarditis now on Actemra 162 mg [place] q14 days and colchicine 0.6 mg BID. He has taken 3 doses so far. He thinks there has been an improvement after starting actemra he was almost unable to walk due to joint pain unless taking prednisone, now he has pain and stiffness but tolerable. Hand swelling is improved although keeps some constant swelling in ankles and feet. He last took prednisone about 2 weeks ago.  Previous HPI 05/30/2022 Noah Grant is a 71 y.o. male here for follow up for RA he was on leflunomide recently hospitalized with sepsis and pericarditis without clear underlying infection identified.  Symptoms started with chills developed increasing swelling in his hands and feet with joint pain that was worse in the shoulders and hips.  He developed severe chest pain felt a constriction or tightness around the heart.  The chest pain was sharp did report provocation with deep breaths or lying in left or right positions.  Evaluation was consistent with pericarditis with effusion.  He was discharged on colchicine and received short term steroids and the chest pain is currently doing well.  The flareup of joint pain has also improved with the oral anti-inflammatory medication.   Previous HPI 04/19/22 Noah Grant is a 71 y.o. male here for rheumatoid arthritis previously seeing Dr. Gerilyn Nestle and on treatment with leflunomide.  Symptoms started a long time ago gradual onset but has been on treatment of this in the past decade or so.  He has joint  pain and stiffness involving multiple areas.  Shoulders, wrists, hands, also bilateral hip pains.  Most commonly sees visible swelling or inflammation in his hands.  He has morning stiffness very severe for 30 minutes to an hour but keeps persistent joint stiffness throughout the day.  Also has pain increased when trying to lie still at night.  He has tried a number of treatments for this over multiple years.  He did not tolerate hydroxychloroquine due to confusion, MTX not tolerated, leflunomide leukopenia, and sulfasalazine mood disturbances.  He never experienced a significant improvement with Humira injection and developed acute conjunctivitis shortly after starting Remicade infusion.   DMARD Hx LEF current MTX GI intolerance HCQ Psychiatric side effects SSZ Psychiatric side effects Humira nonresponder Remicade nonresponder and conjunctivitis   12/2021 Cholesterol 107 TGs 193 HDL 28.9   09/2021 HBV neg HCV neg   Review of Systems  Constitutional:  Positive for fatigue.  HENT:  Negative for mouth sores and mouth dryness.   Eyes:  Positive for dryness.  Respiratory:  Positive for shortness of breath.   Cardiovascular:  Positive for chest pain. Negative for palpitations.  Gastrointestinal:  Negative for blood in stool, constipation and diarrhea.  Endocrine: Positive for increased urination.  Genitourinary:  Negative for involuntary urination.  Musculoskeletal:  Positive for joint pain, gait problem, joint pain, joint swelling and morning stiffness. Negative for myalgias, muscle weakness,  muscle tenderness and myalgias.  Skin:  Positive for hair loss. Negative for color change, rash and sensitivity to sunlight.  Allergic/Immunologic: Negative for susceptible to infections.  Neurological:  Positive for dizziness. Negative for headaches.  Hematological:  Negative for swollen glands.  Psychiatric/Behavioral:  Positive for sleep disturbance. Negative for depressed mood. The patient is  not nervous/anxious.     PMFS History:  Patient Active Problem List   Diagnosis Date Noted   Stasis dermatitis of both legs 07/29/2022   Myocarditis ([place]) 05/14/2022   Pericarditis 05/12/2022   Atrial fibrillation with rapid ventricular response ([place]) 05/12/2022   Sepsis ([place]) 05/11/2022   Elevated troponin level not due myocardial infarction 05/11/2022   Lactic acidosis 05/11/2022   Drug therapy 05/18/2021   Hypertension    Normocytic anemia    Arthritis    Bipolar 1 disorder (HCC)    Chicken pox    Chronic bronchitis (HCC)    Depression    Hyperlipidemia    Rheumatic fever    Essential hypertension    Coronary artery disease involving native coronary artery of native heart 09/06/2020   Chest pain 09/06/2020   Angina pectoris ([place]) 08/30/2020   Cardiac murmur 08/30/2020   Statin intolerance 07/31/2020   Abdominal aortic atherosclerosis ([place])    Ingrowing nail 03/12/2020   Bipolar affective disorder, currently depressed, moderate ([place]) 03/08/2020   Brain ischemia 03/08/2020   Cognitive complaints 03/08/2020   History of stroke 11/10/2019   Chronic kidney disease, stage 3a ([place]) 07/21/2019   Thoracic radiculopathy 06/16/2019   Intention tremor 06/16/2019   Right-sided chest wall pain 04/17/2019   Barrett's esophagus without dysplasia 03/25/2019   History of colon polyps 03/25/2019   PAD (peripheral artery disease) ([place]) 03/23/2019   BPH with urinary obstruction 12/29/2018   GAD (generalized anxiety disorder) 11/02/2018   Rheumatoid arthritis ([place]) 26/20/3559   Lichen planus 74/16/3845   High risk medication use 03/23/2018   Seasonal allergies 03/23/2018   Low testosterone 09/17/2016   Vitamin B12 deficiency 09/17/2016   Vitamin D deficiency 09/17/2016   Disc degeneration, 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   Pain of right hip joint 01/19/2016   Herpes gingivostomatitis 02/10/2015   Midline thoracic back pain 02/10/2015   Normochromic normocytic anemia 08/16/2014   Bipolar 1  disorder, mixed, moderate ([place]) 08/16/2014   Mixed hyperlipidemia 08/16/2014   Labile hypertension 08/16/2014   Gastroesophageal reflux disease 08/16/2014   Bipolar depression ([place]) 08/16/2014   GERD without esophagitis 08/16/2014    Past Medical History:  Diagnosis Date   Abdominal aortic atherosclerosis ([place])    Anemia    likely of chronic disease   Angina pectoris ([place]) 08/30/2020   Arthritis    Atrial fibrillation ([place])    Barrett's esophagus without dysplasia 03/25/2019   Formatting of this note might be different from the original. EGD done in 02/2019. Due in 3 years.   Bipolar 1 disorder ([place])    Bipolar 1 disorder, mixed, moderate ([place]) 08/16/2014   Bipolar affective disorder, currently depressed, moderate ([place]) 03/08/2020   Bipolar depression ([place]) 08/16/2014   BPH with urinary obstruction 12/29/2018   Brain ischemia 03/08/2020   Cardiac murmur 08/30/2020   Chest pain 09/06/2020   Chicken pox    Chronic bronchitis (HCC)    Cognitive complaints 03/08/2020   Coronary artery disease involving native coronary artery of native heart with angina pectoris ([place]) 09/06/2020   Depression    Disc degeneration, 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   Drug therapy 05/18/2021   Essential hypertension    GAD (  generalized anxiety disorder) 11/02/2018   Gastroesophageal reflux disease 08/16/2014   Formatting of this note might be different from the original. Last Assessment & Plan:  Well controlled. Continue current medications.   Gastroesophageal reflux disease without esophagitis 08/16/2014   Formatting of this note might be different from the original. Last Assessment & Plan:  Well controlled. Continue current medications.   Herpes gingivostomatitis 02/10/2015   History of colon polyps 03/25/2019   Formatting of this note might be different from the original. tubular adenoma   History of stroke 11/10/2019   Hyperlipidemia    Hypertension    Ingrowing nail 03/12/2020   Labile hypertension  08/16/2014   Formatting of this note might be different from the original. Last Assessment & Plan:  Slightly above goal today secondary to pain. Will continue current regimen. Will check CMP today.   Lichen planus 81/85/6314   Last Assessment & Plan:  Formatting of this note might be different from the original. Concern over possible lichen planus. His dentist noticed lesions of his oral mucosa.  It was felt to be lichen planus.  He presents to discuss further.  Rarely does the area hurt.He smoked in the distant past EXAM shows several white patches on the buccal mucosa bilaterally.  No other concerning oral mucosal les   Long-term use of immunosuppressant medication 03/23/2018   Low testosterone 09/17/2016   Midline thoracic back pain 02/10/2015   Mixed hyperlipidemia 08/16/2014   Normochromic normocytic anemia 08/16/2014   PAD (peripheral artery disease) ([place]) 03/23/2019   Pain of right hip joint 01/19/2016   Rheumatic fever    Rheumatoid arthritis ([place]) 10/29/2018   Formatting of this note might be different from the original. Rheum:  Dr. Gerilyn Nestle Formerly on MTX - stopped due to mouth sores. Currently started on Arava.  Also on 5 mg prednisone x 3 months during run-in.   Right-sided chest wall pain 04/17/2019   Seasonal allergies 03/23/2018   Stage 3a chronic kidney disease ([place]) 07/21/2019   Statin intolerance 07/31/2020   Task-specific dystonia of hand    right hand   Thoracic radiculopathy 06/16/2019   Tremor 06/16/2019   Vitamin B12 deficiency    Injections   Vitamin D deficiency 09/17/2016    Family History  Problem Relation Age of Onset   Alzheimer's disease Mother 39       Deceased in early 22s   Stroke Father 32       Deceased   Hypertension Father    Alcoholism Father    Alcoholism Brother    Heart disease Maternal Grandfather 6       Deceased   Heart disease Paternal Grandfather 2       Deceased   Hypertension Son    Past Surgical History:  Procedure  Laterality Date   CLAVICLE SURGERY     Right, Hardware placed   CORONARY STENT INTERVENTION N/A 09/06/2020   Procedure: CORONARY STENT INTERVENTION;  Surgeon: Sherren Mocha, MD;  Location: [facility];  Service: Cardiovascular;  Laterality: N/A;   LEFT HEART CATH AND CORONARY ANGIOGRAPHY N/A 09/06/2020   Procedure: LEFT HEART CATH AND CORONARY ANGIOGRAPHY;  Surgeon: Sherren Mocha, MD;  Location: [facility];  Service: Cardiovascular;  Laterality: N/A;   WISDOM TOOTH EXTRACTION     Social History   Social History Narrative   Not on file   Immunization History  Administered Date(s) Administered   Fluad Quad(high Dose 65+) 09/03/2021   Influenza, High Dose Seasonal PF 09/10/2019, 09/03/2020  Influenza,inj,Quad PF,6+ Mos 08/22/2014, 10/04/2015   Moderna SARS-COV2 Booster Vaccination 10/31/2020, 07/04/2021   Moderna Sars-Covid-2 Vaccination 01/17/2020, 02/13/2020   PNEUMOCOCCAL CONJUGATE-20 01/02/2022   Zoster Recombinat (Shingrix) 07/02/2017, 09/02/2017   Zoster, Live 08/17/2011     Objective: Vital Signs: BP 119/63 (BP Location: Right Arm, Patient Position: Sitting, Cuff Size: Normal)   Pulse (!) 45   Resp 14   Ht $R'5\' 7"'td$  (1.702 m)   Wt 160 lb (72.6 kg)   BMI 25.06 kg/m    Physical Exam Cardiovascular:     Rate and Rhythm: Normal rate and regular rhythm.  Pulmonary:     Effort: Pulmonary effort is normal.     Breath sounds: Normal breath sounds.  Musculoskeletal:     Right lower leg: Edema present.     Left lower leg: Edema present.  Skin:    General: Skin is warm and dry.     Findings: Rash present.     Comments: 1+ pitting edema above ankles at least halfway towards knee on anterior shins, there is petechial rash in the same distribution  Neurological:     Mental Status: He is alert.     Comments: Walking with cane for long distances, able to move with normal gait during exam Tremor in bilateral arms worse with intention  Psychiatric:        Mood and  Affect: Mood normal.      Musculoskeletal Exam:  Shoulders mildly reduced overhead abduction range of motion and external rotation, mild tenderness to pressure laterally no palpable swelling Elbows full ROM no tenderness or swelling Wrists full ROM no tenderness or swelling Fingers with chronic MCP joint large meant soft tissue thickening no discrete joint effusions palpable, tenderness to pressure on right hand MCPs 2 through 4, full range of motion intact Knees full ROM no tenderness or swelling Ankles full ROM, overlying pedal edema present   CDAI Exam: CDAI Score: 12  Patient Global: 40 mm; Provider Global: 30 mm Swollen: 0 ; Tender: 5  Joint Exam 07/29/2022      Right  Left  Glenohumeral   Tender   Tender  MCP 2   Tender     MCP 3   Tender     MCP 4   Tender        Investigation: No additional findings.  Imaging: No results found.  Recent Labs: Lab Results  Component Value Date   WBC 7.1 06/28/2022   HGB 10.9 (L) 06/28/2022   PLT 235.0 06/28/2022   NA 139 05/30/2022   K 5.3 05/30/2022   CL 105 05/30/2022   CO2 27 05/30/2022   GLUCOSE 82 05/30/2022   BUN 31 (H) 05/30/2022   CREATININE 1.41 (H) 05/30/2022   BILITOT 0.4 05/30/2022   ALKPHOS 44 05/12/2022   AST 16 05/30/2022   ALT 23 05/30/2022   PROT 6.3 05/30/2022   ALBUMIN 2.8 (L) 05/12/2022   CALCIUM 9.3 05/30/2022   GFRAA 75 11/28/2020   QFTBGOLDPLUS NEGATIVE 04/19/2022    Speciality Comments: No specialty comments available.  Procedures:  No procedures performed Allergies: Methotrexate, Nsaids, Plaquenil [hydroxychloroquine], Azulfidine [sulfasalazine], Crestor [rosuvastatin], Isosorbide, Mevacor [lovastatin], and Pravachol [pravastatin]   Assessment / Plan:     Visit Diagnoses: Rheumatoid arthritis, involving unspecified site, unspecified whether rheumatoid factor present ([place]) - Plan: Sedimentation rate  No objective swelling on exam but still with joint tenderness and also recent prednisone  treatment. Checking ESR for disease activity monitoring. Still relatively early on Actemra for RA symptoms response.  Continue actemra [place]umb[place] 162 mg q14days.  Acute pericarditis, unspecified type - Plan: Sedimentation rate  Appears to be fully controlled on current actemra and colchicine treatment.  High risk medication use - Plan: CBC with Differential/Platelet, COMPLETE METABOLIC PANEL WITH GFR  Checking CBC and CMP for medication monitoring actemra and colchicine. He had moderately decreased lymphocyte count previously as most likely concern with these combination.  Mild peripheral edema  Some increase pedal edema, no evidence elsewhere of CHF or pleurisy.  Orders: Orders Placed This Encounter  Procedures   Sedimentation rate   CBC with Differential/Platelet   COMPLETE METABOLIC PANEL WITH GFR   No orders of the defined types were placed in this encounter.    Follow-Up Instructions: No follow-ups on file.   Collier Salina, MD  Note - This record has been created using [employer].  Chart creation errors have been sought, but may not always  have been located. Such creation errors do not reflect on  the standard of medical care.

## 2022-07-26 DIAGNOSIS — Z7952 Long term (current) use of systemic steroids: Secondary | ICD-10-CM | POA: Diagnosis not present

## 2022-07-26 DIAGNOSIS — Z79620 Long term (current) use of immunosuppressive biologic: Secondary | ICD-10-CM | POA: Diagnosis not present

## 2022-07-26 DIAGNOSIS — I4891 Unspecified atrial fibrillation: Secondary | ICD-10-CM | POA: Diagnosis not present

## 2022-07-26 DIAGNOSIS — Z8679 Personal history of other diseases of the circulatory system: Secondary | ICD-10-CM | POA: Diagnosis not present

## 2022-07-26 DIAGNOSIS — I25118 Atherosclerotic heart disease of native coronary artery with other forms of angina pectoris: Secondary | ICD-10-CM | POA: Diagnosis not present

## 2022-07-26 DIAGNOSIS — M059 Rheumatoid arthritis with rheumatoid factor, unspecified: Secondary | ICD-10-CM | POA: Diagnosis not present

## 2022-07-26 DIAGNOSIS — Z7901 Long term (current) use of anticoagulants: Secondary | ICD-10-CM | POA: Diagnosis not present

## 2022-07-29 ENCOUNTER — Ambulatory Visit: Payer: PPO | Attending: Internal Medicine | Admitting: Internal Medicine

## 2022-07-29 ENCOUNTER — Encounter: Payer: Self-pay | Admitting: Internal Medicine

## 2022-07-29 VITALS — BP 119/63 | HR 45 | Resp 14 | Ht 67.0 in | Wt 160.0 lb

## 2022-07-29 DIAGNOSIS — I872 Venous insufficiency (chronic) (peripheral): Secondary | ICD-10-CM | POA: Insufficient documentation

## 2022-07-29 DIAGNOSIS — M069 Rheumatoid arthritis, unspecified: Secondary | ICD-10-CM

## 2022-07-29 DIAGNOSIS — Z79899 Other long term (current) drug therapy: Secondary | ICD-10-CM

## 2022-07-29 DIAGNOSIS — I309 Acute pericarditis, unspecified: Secondary | ICD-10-CM

## 2022-07-29 DIAGNOSIS — R600 Localized edema: Secondary | ICD-10-CM

## 2022-07-29 HISTORY — DX: Venous insufficiency (chronic) (peripheral): I87.2

## 2022-07-30 DIAGNOSIS — M05741 Rheumatoid arthritis with rheumatoid factor of right hand without organ or systems involvement: Secondary | ICD-10-CM | POA: Diagnosis not present

## 2022-07-30 DIAGNOSIS — M05742 Rheumatoid arthritis with rheumatoid factor of left hand without organ or systems involvement: Secondary | ICD-10-CM | POA: Diagnosis not present

## 2022-07-30 DIAGNOSIS — M1990 Unspecified osteoarthritis, unspecified site: Secondary | ICD-10-CM | POA: Diagnosis not present

## 2022-07-30 DIAGNOSIS — R0989 Other specified symptoms and signs involving the circulatory and respiratory systems: Secondary | ICD-10-CM | POA: Diagnosis not present

## 2022-07-30 DIAGNOSIS — N1831 Chronic kidney disease, stage 3a: Secondary | ICD-10-CM | POA: Diagnosis not present

## 2022-07-30 DIAGNOSIS — N179 Acute kidney failure, unspecified: Secondary | ICD-10-CM | POA: Diagnosis not present

## 2022-07-30 LAB — CBC WITH DIFFERENTIAL/PLATELET
Absolute Monocytes: 813 cells/uL (ref 200–950)
Basophils Absolute: 49 cells/uL (ref 0–200)
Basophils Relative: 1 %
Eosinophils Absolute: 279 cells/uL (ref 15–500)
Eosinophils Relative: 5.7 %
HCT: 33.1 % — ABNORMAL LOW (ref 38.5–50.0)
Hemoglobin: 11 g/dL — ABNORMAL LOW (ref 13.2–17.1)
Lymphs Abs: 573 cells/uL — ABNORMAL LOW (ref 850–3900)
MCH: 30.6 pg (ref 27.0–33.0)
MCHC: 33.2 g/dL (ref 32.0–36.0)
MCV: 92.2 fL (ref 80.0–100.0)
MPV: 11.5 fL (ref 7.5–12.5)
Monocytes Relative: 16.6 %
Neutro Abs: 3185 cells/uL (ref 1500–7800)
Neutrophils Relative %: 65 %
Platelets: 206 10*3/uL (ref 140–400)
RBC: 3.59 10*6/uL — ABNORMAL LOW (ref 4.20–5.80)
RDW: 13.3 % (ref 11.0–15.0)
Total Lymphocyte: 11.7 %
WBC: 4.9 10*3/uL (ref 3.8–10.8)

## 2022-07-30 LAB — COMPLETE METABOLIC PANEL WITH GFR
AG Ratio: 2.6 (calc) — ABNORMAL HIGH (ref 1.0–2.5)
ALT: 39 U/L (ref 9–46)
AST: 20 U/L (ref 10–35)
Albumin: 4.5 g/dL (ref 3.6–5.1)
Alkaline phosphatase (APISO): 55 U/L (ref 35–144)
BUN/Creatinine Ratio: 19 (calc) (ref 6–22)
BUN: 34 mg/dL — ABNORMAL HIGH (ref 7–25)
CO2: 25 mmol/L (ref 20–32)
Calcium: 9.5 mg/dL (ref 8.6–10.3)
Chloride: 107 mmol/L (ref 98–110)
Creat: 1.75 mg/dL — ABNORMAL HIGH (ref 0.70–1.28)
Globulin: 1.7 g/dL (calc) — ABNORMAL LOW (ref 1.9–3.7)
Glucose, Bld: 93 mg/dL (ref 65–99)
Potassium: 5.2 mmol/L (ref 3.5–5.3)
Sodium: 139 mmol/L (ref 135–146)
Total Bilirubin: 0.3 mg/dL (ref 0.2–1.2)
Total Protein: 6.2 g/dL (ref 6.1–8.1)
eGFR: 41 mL/min/{1.73_m2} — ABNORMAL LOW (ref >=60)

## 2022-07-30 LAB — SEDIMENTATION RATE: Sed Rate: 2 mm/h (ref 0–20)

## 2022-07-30 NOTE — Progress Notes (Signed)
The white blood cell count looks okay his lymphocytes are improved a bit from the 200s to 500s.  From this time I think is okay to finish the planned course of colchicine does not need to stop immediately. His sedimentation rate test remains normal.  The kidney function test looks slightly worse than before I am not sure whether this is directly related with the fluid retention or swelling that we discussed.  You may benefit with medication adjustment would defer to his kidney doctor recommendations with appointment coming up.

## 2022-07-31 ENCOUNTER — Ambulatory Visit: Payer: PPO | Admitting: Internal Medicine

## 2022-08-01 NOTE — Progress Notes (Signed)
I am not aware of an association with actemra and affecting the kidney function. I would be more suspicious if his colchicine may be contributing. He could try stopping this earlier and seeing if labs improve, looks like they planned to repeat in 2 weeks?

## 2022-08-08 ENCOUNTER — Other Ambulatory Visit: Payer: Self-pay | Admitting: Family Medicine

## 2022-08-08 DIAGNOSIS — G252 Other specified forms of tremor: Secondary | ICD-10-CM

## 2022-08-19 DIAGNOSIS — N1831 Chronic kidney disease, stage 3a: Secondary | ICD-10-CM | POA: Diagnosis not present

## 2022-08-19 DIAGNOSIS — N179 Acute kidney failure, unspecified: Secondary | ICD-10-CM | POA: Diagnosis not present

## 2022-09-02 ENCOUNTER — Other Ambulatory Visit: Payer: Self-pay

## 2022-09-02 DIAGNOSIS — D649 Anemia, unspecified: Secondary | ICD-10-CM | POA: Insufficient documentation

## 2022-09-02 DIAGNOSIS — I4891 Unspecified atrial fibrillation: Secondary | ICD-10-CM | POA: Insufficient documentation

## 2022-09-03 ENCOUNTER — Ambulatory Visit: Payer: PPO | Attending: Cardiology | Admitting: Cardiology

## 2022-09-03 ENCOUNTER — Telehealth: Payer: Self-pay

## 2022-09-03 ENCOUNTER — Encounter: Payer: Self-pay | Admitting: Cardiology

## 2022-09-03 VITALS — BP 129/55 | HR 43 | Ht 67.0 in | Wt 160.0 lb

## 2022-09-03 DIAGNOSIS — I2510 Atherosclerotic heart disease of native coronary artery without angina pectoris: Secondary | ICD-10-CM

## 2022-09-03 DIAGNOSIS — Z8673 Personal history of transient ischemic attack (TIA), and cerebral infarction without residual deficits: Secondary | ICD-10-CM

## 2022-09-03 DIAGNOSIS — E782 Mixed hyperlipidemia: Secondary | ICD-10-CM | POA: Diagnosis not present

## 2022-09-03 DIAGNOSIS — I25119 Atherosclerotic heart disease of native coronary artery with unspecified angina pectoris: Secondary | ICD-10-CM

## 2022-09-03 DIAGNOSIS — I1310 Hypertensive heart and chronic kidney disease without heart failure, with stage 1 through stage 4 chronic kidney disease, or unspecified chronic kidney disease: Secondary | ICD-10-CM | POA: Diagnosis not present

## 2022-09-03 DIAGNOSIS — N1831 Chronic kidney disease, stage 3a: Secondary | ICD-10-CM | POA: Diagnosis not present

## 2022-09-03 DIAGNOSIS — I10 Essential (primary) hypertension: Secondary | ICD-10-CM

## 2022-09-03 DIAGNOSIS — I700 Atherosclerosis of aorta: Secondary | ICD-10-CM | POA: Diagnosis not present

## 2022-09-03 NOTE — Telephone Encounter (Signed)
  Patient Consent for Virtual Visit        Noah Grant has provided verbal consent on 09/03/2022 for a virtual visit (video or telephone).   CONSENT FOR VIRTUAL VISIT FOR:  Noah Grant  By participating in this virtual visit I agree to the following:  I hereby voluntarily request, consent and authorize [facility] and its employed or contracted physicians, physician assistants, nurse practitioners or other licensed health care professionals (the Practitioner), to provide me with telemedicine health care services (the "Services") as deemed necessary by the treating Practitioner. I acknowledge and consent to receive the Services by the Practitioner via telemedicine. I understand that the telemedicine visit will involve communicating with the Practitioner through live audiovisual communication technology and the disclosure of certain medical information by electronic transmission. I acknowledge that I have been given the opportunity to request an in-person assessment or other available alternative prior to the telemedicine visit and am voluntarily participating in the telemedicine visit.  I understand that I have the right to withhold or withdraw my consent to the use of telemedicine in the course of my care at any time, without affecting my right to future care or treatment, and that the Practitioner or I may terminate the telemedicine visit at any time. I understand that I have the right to inspect all information obtained and/or recorded in the course of the telemedicine visit and may receive copies of available information for a reasonable fee.  I understand that some of the potential risks of receiving the Services via telemedicine include:  Delay or interruption in medical evaluation due to technological equipment failure or disruption; Information transmitted may not be sufficient (e.g. poor resolution of images) to allow for appropriate medical decision making by the  Practitioner; and/or  In rare instances, security protocols could fail, causing a breach of personal health information.  Furthermore, I acknowledge that it is my responsibility to provide information about my medical history, conditions and care that is complete and accurate to the best of my ability. I acknowledge that Practitioner's advice, recommendations, and/or decision may be based on factors not within their control, such as incomplete or inaccurate data provided by me or distortions of diagnostic images or specimens that may result from electronic transmissions. I understand that the practice of medicine is not an exact science and that Practitioner makes no warranties or guarantees regarding treatment outcomes. I acknowledge that a copy of this consent can be made available to me via my patient portal ([facility]), or I can request a printed copy by calling the office of [facility].    I understand that my insurance will be billed for this visit.   I have read or had this consent read to me. I understand the contents of this consent, which adequately explains the benefits and risks of the Services being provided via telemedicine.  I have been provided ample opportunity to ask questions regarding this consent and the Services and have had my questions answered to my satisfaction. I give my informed consent for the services to be provided through the use of telemedicine in my medical care

## 2022-09-03 NOTE — Progress Notes (Signed)
Virtual Visit via Telephone Note   Because of [place] co-morbid illnesses, he is at least at moderate risk for complications without adequate follow up.  This format is felt to be most appropriate for this patient at this time.  The patient did not have access to video technology/had technical difficulties with video requiring transitioning to audio format only (telephone).  All issues noted in this document were discussed and addressed.  No physical exam could be performed with this format.  Please refer to the patient's chart for his consent to telehealth for [facility].    Date:  09/03/2022   ID:  Noah Grant, DOB 1951-02-11, MRN [id number] The patient was identified using 2 identifiers.  Patient Location: Home Provider Location: Home Office   PCP:  [name], [place] [facility] Cardiologist:  Jenean Lindau, MD     Evaluation Performed:  Follow-Up Visit  Chief Complaint: Patient has past medical history of coronary artery disease, essential hypertension, dyslipidemia, renal insufficiency and paroxysmal atrial fibrillation.  History of Present Illness:    Noah Grant is a 71 y.o. male with patient has past medical history of coronary artery disease, essential hypertension, mixed dyslipidemia, paroxysmal atrial fibrillation and renal insufficiency.  He denies any problems at this time and takes care of activities of daily living.  No chest pain orthopnea or PND.  At the time of my evaluation, the patient is alert awake oriented and in no distress.   Past Medical History:  Diagnosis Date   Abdominal aortic atherosclerosis ([place])    Anemia    likely of chronic disease   Angina pectoris ([place]) 08/30/2020   Arthritis    Atrial fibrillation (HCC)    Atrial fibrillation with rapid ventricular response ([place]) 05/12/2022   Barrett's esophagus without dysplasia 03/25/2019   Formatting of this note might be different from the  original. EGD done in 02/2019. Due in 3 years.   Bipolar 1 disorder ([place])    Bipolar 1 disorder, mixed, moderate ([place]) 08/16/2014   Bipolar affective disorder, currently depressed, moderate ([place]) 03/08/2020   Bipolar depression ([place]) 08/16/2014   BPH with urinary obstruction 12/29/2018   Brain ischemia 03/08/2020   Cardiac murmur 08/30/2020   Chest pain 09/06/2020   Chicken pox    Chronic bronchitis (HCC)    Chronic kidney disease, stage 3a ([place]) 07/21/2019   Cognitive complaints 03/08/2020   Coronary artery disease involving native coronary artery of native heart 09/06/2020   Coronary artery disease involving native coronary artery of native heart with angina pectoris ([place]) 09/06/2020   Depression    Disc degeneration, 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 Last Assessment & Plan:  Well controlled. Continue current medications.   GERD without esophagitis 08/16/2014   Formatting of this note might be different from the original. Last Assessment & Plan:  Well controlled. Continue current medications.   Herpes gingivostomatitis 02/10/2015   High risk medication use 03/23/2018   History of colon polyps 03/25/2019   Formatting of this note might be different from the original. tubular adenoma   History of stroke 11/10/2019   Hyperlipidemia    Hypertension    Ingrowing nail 03/12/2020   Intention tremor 06/16/2019   Labile  hypertension 08/16/2014   Formatting of this note might be different from the original. Last Assessment & Plan:  Slightly above goal today secondary to pain. Will continue current regimen. Will check CMP today.   Lactic acidosis 0/01/7252   Lichen planus 66/44/0347   Last Assessment & Plan:  Formatting of this note might be  different from the original. Concern over possible lichen planus. His dentist noticed lesions of his oral mucosa.  It was felt to be lichen planus.  He presents to discuss further.  Rarely does the area hurt.He smoked in the distant past EXAM shows several white patches on the buccal mucosa bilaterally.  No other concerning oral mucosal les   Long-term use of immunosuppressant medication 03/23/2018   Low testosterone 09/17/2016   Midline thoracic back pain 02/10/2015   Mixed hyperlipidemia 08/16/2014   Myocarditis ([place]) 05/14/2022   Normochromic normocytic anemia 08/16/2014   Normocytic anemia    likely of chronic disease   PAD (peripheral artery disease) ([place]) 03/23/2019   Pain of right hip joint 01/19/2016   Pericarditis 05/12/2022   Rheumatic fever    Rheumatoid arthritis ([place]) 10/29/2018   Formatting of this note might be different from the original. Rheum:  Dr. Gerilyn Nestle Formerly on MTX - stopped due to mouth sores. Currently started on Arava.  Also on 5 mg prednisone x 3 months during run-in.   Right-sided chest wall pain 04/17/2019   Seasonal allergies 03/23/2018   Sepsis ([place]) 05/11/2022   Stasis dermatitis of both legs 07/29/2022   Statin intolerance 07/31/2020   Task-specific dystonia of hand    right hand   Thoracic radiculopathy 06/16/2019   Vitamin B12 deficiency    Injections   Vitamin D deficiency 09/17/2016   Past Surgical History:  Procedure Laterality Date   CLAVICLE SURGERY     Right, Hardware placed   CORONARY STENT INTERVENTION N/A 09/06/2020   Procedure: CORONARY STENT INTERVENTION;  Surgeon: Sherren Mocha, MD;  Location: [facility];  Service: Cardiovascular;  Laterality: N/A;   LEFT HEART CATH AND CORONARY ANGIOGRAPHY N/A 09/06/2020   Procedure: LEFT HEART CATH AND CORONARY ANGIOGRAPHY;  Surgeon: Sherren Mocha, MD;  Location: [facility];  Service: Cardiovascular;  Laterality: N/A;   WISDOM TOOTH EXTRACTION       Current Meds  Medication Sig    amLODipine (NORVASC) 10 MG tablet Take 1 tablet (10 mg total) by mouth daily.   apixaban (ELIQUIS) 5 MG TABS tablet Take 1 tablet (5 mg total) by mouth 2 (two) times daily.   Cholecalciferol 50 MCG (2000 UT) CAPS Take 2,000 Units by mouth daily.    Coenzyme Q10 (COQ10) 200 MG CAPS Take 200 mg by mouth daily.   diclofenac Sodium (VOLTAREN) 1 % GEL Apply 1 application  topically daily as needed for pain.   ezetimibe (ZETIA) 10 MG tablet TAKE ONE TABLET BY MOUTH DAILY   hydrALAZINE (APRESOLINE) 50 MG tablet Take 50 mg by mouth 3 (three) times daily.   lithium carbonate 150 MG capsule TAKE THREE CAPSULES BY MOUTH DAILY   losartan (COZAAR) 100 MG tablet Take 100 mg by mouth daily.   Lysine HCl 1000 MG TABS Take 1,000 mg by mouth 2 (two) times daily.    metoprolol succinate (TOPROL-XL) 50 MG 24 hr tablet Take 1 tablet (50 mg total) by mouth daily. Take with or immediately following a meal.   nitroGLYCERIN (NITROSTAT) 0.4 MG SL tablet Place 1 tablet (0.4 mg total) under the tongue every 5 (five)  minutes as needed for chest pain.   pantoprazole (PROTONIX) 40 MG tablet Take 1 tablet (40 mg total) by mouth 2 (two) times daily.   simvastatin (ZOCOR) 5 MG tablet Take 1 tablet (5 mg total) by mouth daily.   Tocilizumab (ACTEMRA ACTPEN) 162 MG/0.9ML SOAJ Inject 162 mg into the skin every 14 (fourteen) days.   vitamin B-12 (CYANOCOBALAMIN) 1000 MCG tablet Take 1,000 mcg by mouth daily.     Allergies:   Methotrexate, Nsaids, Plaquenil [hydroxychloroquine], Azulfidine [sulfasalazine], Crestor [rosuvastatin], Isosorbide, Mevacor [lovastatin], and Pravachol [pravastatin]   Social History   Tobacco Use   Smoking status: Former    Packs/day: 2.00    Years: 10.00    Total pack years: 20.00    Types: Cigarettes    Quit date: 1974    Years since quitting: 49.7   Smokeless tobacco: Never   Tobacco comments:    Quit>40 years  Vaping Use   Vaping Use: Never used  Substance Use Topics   Alcohol use: Not  Currently    Alcohol/week: 0.0 standard drinks of alcohol   Drug use: Never     Family Hx: The patient's family history includes Alcoholism in his brother and father; Alzheimer's disease (age of onset: 38) in his mother; Heart disease (age of onset: 60) in his paternal grandfather; Heart disease (age of onset: 35) in his maternal grandfather; Hypertension in his father and son; Stroke (age of onset: 66) in his father.  ROS:   Please see the history of present illness.    Patient denies any chest pain orthopnea or PND.  His only issue is nonspecific bilateral swelling in the legs which has been persistent for some time. All other systems reviewed and are negative.   Prior CV studies:   The following studies were reviewed today:  MRI report reviewed and discussed with the patient at length.  This is cardiac MRI with evidence of myocarditis.  Labs/Other Tests and Data Reviewed:    EKG:  An ECG dated 723 was personally reviewed today and demonstrated:  Sinus bradycardia nonspecific ST-T changes  Recent Labs: 05/12/2022: Magnesium 2.0 06/12/2022: TSH 1.27 07/29/2022: ALT 39; BUN 34; Creat 1.75; Hemoglobin 11.0; Platelets 206; Potassium 5.2; Sodium 139   Recent Lipid Panel Lab Results  Component Value Date/Time   CHOL 107 01/02/2022 10:50 AM   CHOL 109 11/28/2020 08:41 AM   TRIG 193.0 (H) 01/02/2022 10:50 AM   HDL 28.90 (L) 01/02/2022 10:50 AM   HDL 33 (L) 11/28/2020 08:41 AM   CHOLHDL 4 01/02/2022 10:50 AM   LDLCALC 39 01/02/2022 10:50 AM   LDLCALC 57 11/28/2020 08:41 AM   LDLDIRECT 99.0 12/01/2019 10:56 AM    Wt Readings from Last 3 Encounters:  09/03/22 160 lb (72.6 kg)  07/29/22 160 lb (72.6 kg)  07/15/22 161 lb (73 kg)     Risk Assessment/Calculations:    CHA2DS2-VASc Score = 5   This indicates a 7.2% annual risk of stroke. The patient's score is based upon: CHF History: 0 HTN History: 1 Diabetes History: 0 Stroke History: 2 Vascular Disease History: 1 Age Score:  1 Gender Score: 0         Objective:    Vital Signs:  BP (!) 129/55   Pulse (!) 43   Ht 5\' 7"  (1.702 m)   Wt 160 lb (72.6 kg)   BMI 25.06 kg/m    VITAL SIGNS:  reviewed Vital signs were reviewed.  ASSESSMENT & PLAN:    Coronary artery disease:  Secondary prevention stressed with patient.  Importance of compliance with diet and medication stressed any vocalized understanding.  He was advised to walk at least half an hour a day 5 days a week and he promises to do so. Essential hypertension: Blood pressure stable and diet was emphasized.  Lifestyle modification urged.  I told him to speak to his nephrologist about switching amlodipine for pedal edema and he will do so. Mixed dyslipidemia: On lipid-lowering medications and by primary care.  Diet emphasized. Renal insufficiency: I reviewed this.  This is significant.  Patient is monitored closely by primary care. Paroxysmal atrial fibrillation:I discussed with the patient atrial fibrillation, disease process. Management and therapy including rate and rhythm control, anticoagulation benefits and potential risks were discussed extensively with the patient. Patient had multiple questions which were answered to patient's satisfaction.             Time:   Today, I have spent 25 minutes with the patient with telehealth technology discussing the above problems.     Medication Adjustments/Labs and Tests Ordered: Current medicines are reviewed at length with the patient today.  Concerns regarding medicines are outlined above.   Tests Ordered: No orders of the defined types were placed in this encounter.   Medication Changes: No orders of the defined types were placed in this encounter.   Follow Up:  In Person in 75 month(s)  Signed, Jenean Lindau, MD  09/03/2022 10:13 AM    [place]

## 2022-09-03 NOTE — Patient Instructions (Signed)

## 2022-09-04 ENCOUNTER — Other Ambulatory Visit (HOSPITAL_BASED_OUTPATIENT_CLINIC_OR_DEPARTMENT_OTHER): Payer: Self-pay

## 2022-09-04 MED ORDER — FLUAD QUADRIVALENT 0.5 ML IM PRSY
PREFILLED_SYRINGE | INTRAMUSCULAR | 0 refills | Status: DC
Start: 2022-09-04 — End: 2023-02-11
  Filled 2022-09-04: qty 0.5, 1d supply, fill #0

## 2022-09-16 ENCOUNTER — Ambulatory Visit (INDEPENDENT_AMBULATORY_CARE_PROVIDER_SITE_OTHER): Payer: PPO | Admitting: Family Medicine

## 2022-09-16 ENCOUNTER — Encounter: Payer: Self-pay | Admitting: Family Medicine

## 2022-09-16 VITALS — BP 120/71 | HR 45 | Temp 98.6°F | Ht 67.0 in | Wt 169.0 lb

## 2022-09-16 DIAGNOSIS — T50905A Adverse effect of unspecified drugs, medicaments and biological substances, initial encounter: Secondary | ICD-10-CM

## 2022-09-16 DIAGNOSIS — H1131 Conjunctival hemorrhage, right eye: Secondary | ICD-10-CM | POA: Diagnosis not present

## 2022-09-16 DIAGNOSIS — R001 Bradycardia, unspecified: Secondary | ICD-10-CM | POA: Diagnosis not present

## 2022-09-16 MED ORDER — METOPROLOL SUCCINATE ER 25 MG PO TB24: 25.0000 mg | ORAL_TABLET | Freq: Every day | ORAL | 3 refills | Status: DC

## 2022-09-16 NOTE — Patient Instructions (Addendum)
Artificial tears like Refresh and Systane may be used for comfort. OK to get generic version. Generally people use them every 2-4 hours, but you can use them as much as you want because there is no medication in it.  Stop your eye ointment.   The eye will heal on its own.  Let us know if you need anything.

## 2022-09-16 NOTE — Progress Notes (Signed)
Chief Complaint  Patient presents with   Conjunctivitis    Noah Grant is here for right eye irritation.  Duration: 1  week Chemical exposure? No  No discharge from his eye. Irritated, some blurry vision in AM.  Recent URI? No  Contact lenses? No  History of allergies? No  Treatment to date: erythromycin ointment 2 d ago  Patient with history of bradycardia, currently taking metoprolol XL 50 mg daily.  He is compliant reports some cold sensation and fatigue.  His rheumatologist and nephrologist say his heart rate is too low at his cardiologist is okay with this as long as heart rate is greater than 40.  Past Medical History:  Diagnosis Date   Abdominal aortic atherosclerosis (HCC)    Anemia    likely of chronic disease   Angina pectoris (HCC) 08/30/2020   Arthritis    Atrial fibrillation (HCC)    Atrial fibrillation with rapid ventricular response (HCC) 05/12/2022   Barrett's esophagus without dysplasia 03/25/2019   Formatting of this note might be different from the original. EGD done in 02/2019. Due in 3 years.   Bipolar 1 disorder (HCC)    Bipolar 1 disorder, mixed, moderate (HCC) 08/16/2014   Bipolar affective disorder, currently depressed, moderate (HCC) 03/08/2020   Bipolar depression (HCC) 08/16/2014   BPH with urinary obstruction 12/29/2018   Brain ischemia 03/08/2020   Cardiac murmur 08/30/2020   Chest pain 09/06/2020   Chicken pox    Chronic bronchitis (HCC)    Chronic kidney disease, stage 3a (HCC) 07/21/2019   Cognitive complaints 03/08/2020   Coronary artery disease involving native coronary artery of native heart 09/06/2020   Coronary artery disease involving native coronary artery of native heart with angina pectoris (HCC) 09/06/2020   Depression    Disc degeneration, 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   Drug therapy 05/18/2021   Elevated troponin level not due myocardial infarction 05/11/2022   Essential hypertension    GAD (generalized anxiety disorder) 11/02/2018    Gastroesophageal reflux disease 08/16/2014   Formatting of this note might be different from the original. Last Assessment & Plan:  Well controlled. Continue current medications.   GERD without esophagitis 08/16/2014   Formatting of this note might be different from the original. Last Assessment & Plan:  Well controlled. Continue current medications.   Herpes gingivostomatitis 02/10/2015   High risk medication use 03/23/2018   History of colon polyps 03/25/2019   Formatting of this note might be different from the original. tubular adenoma   History of stroke 11/10/2019   Hyperlipidemia    Hypertension    Ingrowing nail 03/12/2020   Intention tremor 06/16/2019   Labile hypertension 08/16/2014   Formatting of this note might be different from the original. Last Assessment & Plan:  Slightly above goal today secondary to pain. Will continue current regimen. Will check CMP today.   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 Concern over possible lichen planus. His dentist noticed lesions of his oral mucosa.  It was felt to be lichen planus.  He presents to discuss further.  Rarely does the area hurt.He smoked in the distant past EXAM shows several white patches on the buccal mucosa bilaterally.  No other concerning oral mucosal les   Long-term use of immunosuppressant medication 03/23/2018   Low testosterone 09/17/2016   Midline thoracic back pain 02/10/2015   Mixed hyperlipidemia 08/16/2014   Myocarditis (HCC) 05/14/2022  Normochromic normocytic anemia 08/16/2014   Normocytic anemia    likely of chronic disease   PAD (peripheral artery disease) ([place]) 03/23/2019   Pain of right hip joint 01/19/2016   Pericarditis 05/12/2022   Rheumatic fever    Rheumatoid arthritis ([place]) 10/29/2018   Formatting of this note might be different from the original. Rheum:  Dr. Gerilyn Nestle Formerly on MTX - stopped due to  mouth sores. Currently started on Arava.  Also on 5 mg prednisone x 3 months during run-in.   Right-sided chest wall pain 04/17/2019   Seasonal allergies 03/23/2018   Sepsis ([place]) 05/11/2022   Stasis dermatitis of both legs 07/29/2022   Statin intolerance 07/31/2020   Task-specific dystonia of hand    right hand   Thoracic radiculopathy 06/16/2019   Vitamin B12 deficiency    Injections   Vitamin D deficiency 09/17/2016   Family History  Problem Relation Age of Onset   Alzheimer's disease Mother 22       Deceased in early 30s   Stroke Father 70       Deceased   Hypertension Father    Alcoholism Father    Alcoholism Brother    Heart disease Maternal Grandfather 85       Deceased   Heart disease Paternal Grandfather 52       Deceased   Hypertension Son     BP 120/71 (BP Location: Left Arm, Patient Position: Sitting, Cuff Size: Normal)   Pulse (!) 45   Temp 98.6 F (37 C) (Oral)   Ht 5\' 7"  (1.702 m)   Wt 169 lb (76.7 kg)   SpO2 98%   BMI 26.47 kg/m  Gen: Awake, alert, appears stated age Eyes: Lids unremarkable, Sclera with bleeding present medially and inferiorly, PERRLA, EOMi Nose: Nares patent without discharge Heart: Regular rhythm, bradycardic, 2+ pitting lower extremity edema tapering at the distal third of the tibia bilaterally Lungs: Clear to auscultation bilaterally, no accessory muscle use Mouth: MMM, pharynx without erythema or exudate Psych: Age appropriate judgment and insight; mood and affect normal  Subconjunctival hemorrhage of right eye  Adverse effect of drug, initial encounter  Bradycardia  Reassurance. Adverse effect of chronic medication.  Decrease metoprolol from 50 mg daily to 25 mg daily.  Follow-up in 1 month recheck. I do not think the bradycardia is causing symptoms, rather than medication. Pt voiced understanding and agreement to the plan.  [place], DO 09/16/22 4:13 PM

## 2022-09-18 ENCOUNTER — Other Ambulatory Visit: Payer: Self-pay | Admitting: Internal Medicine

## 2022-09-18 DIAGNOSIS — M069 Rheumatoid arthritis, unspecified: Secondary | ICD-10-CM

## 2022-09-18 DIAGNOSIS — Z79899 Other long term (current) drug therapy: Secondary | ICD-10-CM

## 2022-09-18 NOTE — Telephone Encounter (Signed)
Next Visit: 10/29/2022  Last Visit: 07/29/2022  Last Fill: 06/26/2022  QQ:[id number] arthritis, involving unspecified site, unspecified whether rheumatoid factor present   Current Dose per office note 07/29/2022: actemra [place] 162 mg q14days  Labs: The white blood cell count looks okay his lymphocytes are improved a bit from the 200s to 500s. His sedimentation rate test remains normal.  The kidney function test looks slightly worse than before.  TB Gold: 04/19/2022 Neg   Okay to refill Actemra?

## 2022-09-18 NOTE — Telephone Encounter (Signed)
Advised patient we did not receive fax from Patient Assistance patient advised Rx forwarded to Dr. [name] to send to pharmacy. Patient advised to wait 24-48 hours before calling to set up shipment.

## 2022-09-18 NOTE — Telephone Encounter (Signed)
Patient called stating he called patient assistance at [phone] to request prescription refill of Actemra and was told they faxed Dr. [name] requesting a new prescription.  Patient was told it was never received.  Patient states he is out of medication and due to take his injection today, 09/18/22.  Patient requested a return call.

## 2022-09-19 MED ORDER — ACTEMRA ACTPEN 162 MG/0.9ML ~~LOC~~ SOAJ: 162.0000 mg | SUBCUTANEOUS | 0 refills | Status: DC

## 2022-09-23 ENCOUNTER — Telehealth: Payer: Self-pay | Admitting: Internal Medicine

## 2022-09-23 NOTE — Telephone Encounter (Signed)
Meds will be schedule 09/18/2022.

## 2022-09-23 NOTE — Telephone Encounter (Signed)
Patient called stating he called [facility] this morning at [phone] to schedule delivery of his Actemra medication and was told they have not received a prescription.   Patient requested a return call.

## 2022-09-27 ENCOUNTER — Ambulatory Visit: Payer: PPO | Admitting: Podiatrist

## 2022-09-27 DIAGNOSIS — L600 Ingrowing nail: Secondary | ICD-10-CM

## 2022-09-27 NOTE — Telephone Encounter (Signed)
This is Dr. Marveen Reeks patient.

## 2022-09-27 NOTE — Telephone Encounter (Signed)
09/18/22 is past now and at the time of this message but he can resume as soon as he gets the medication without any specific adjustment needed. And then continue 1 dose every 2 weeks.

## 2022-09-27 NOTE — Patient Instructions (Signed)

## 2022-09-27 NOTE — Telephone Encounter (Signed)
Advised patient that 09/18/22 is past the message he received but he can resume as soon as he gets the medication without any specific adjustment needed. And then continue 1 dose every 2 weeks.  Patient stated he received Actemra and started dose on 09/18/2022 and expressed verbal understanding of lab results.

## 2022-09-29 ENCOUNTER — Other Ambulatory Visit: Payer: Self-pay | Admitting: Family Medicine

## 2022-09-29 ENCOUNTER — Other Ambulatory Visit: Payer: Self-pay | Admitting: Cardiology

## 2022-09-29 DIAGNOSIS — I700 Atherosclerosis of aorta: Secondary | ICD-10-CM

## 2022-10-04 ENCOUNTER — Encounter: Payer: Self-pay | Admitting: Podiatrist

## 2022-10-04 ENCOUNTER — Ambulatory Visit: Payer: PPO | Admitting: Podiatrist

## 2022-10-04 DIAGNOSIS — Z9889 Other specified postprocedural states: Secondary | ICD-10-CM

## 2022-10-04 DIAGNOSIS — L600 Ingrowing nail: Secondary | ICD-10-CM

## 2022-10-04 MED ORDER — MUPIROCIN 2 % EX OINT: 1.0000 | TOPICAL_OINTMENT | Freq: Two times a day (BID) | CUTANEOUS | 2 refills | Status: DC

## 2022-10-04 NOTE — Progress Notes (Signed)
Chief Complaint  Patient presents with   Nail Problem     Bilateral nail check     HPI: Patient is 71 y.o. male who presents today for recheck from ingrown nails bilateral first great toeanails. He relates he is doing ok, the nails have not healed yet and sometimes feel sore still like they did prior to the procedure. He has been soaking or cleansing and has been using neosporin and a bandaid.    Allergies  Allergen Reactions   Methotrexate Dermatitis    Developed Mouth Sores   Nsaids Dermatitis    Developed Mouth Blisters   Plaquenil [Hydroxychloroquine] Other (See Comments)    Terrible Nightmares   Azulfidine [Sulfasalazine] Nausea Only   Crestor [Rosuvastatin] Other (See Comments)    Caused abdominal pain that radiated to the back   Isosorbide Other (See Comments)    Made him feel weak   Mevacor [Lovastatin] Other (See Comments)    Caused abdominal pain that radiated to the back   Pravachol [Pravastatin] Other (See Comments)    Caused abdominal pain that radiated to the back    Review of systems is negative except as noted in the HPI.  Denies nausea/ vomiting/ fevers/ chills or night sweats.   Denies difficulty breathing, denies calf pain or tenderness  Physical Exam  Patient is awake, alert, and oriented x 3.  In no acute distress.    Vascular status is intact with palpable pedal pulses DP and PT bilateral and capillary refill time less than 3 seconds bilateral.  No edema or erythema noted.   Neurological exam reveals epicritic and protective sensation grossly intact bilateral.   Dermatological exam reveals medial and lateral borders of bilateral hallux nails are healing well. No redness, no swelling, no sign of infection noted.  Some incomplete healing in the corners is present and appear to be healing at a normal rate for the phenol matrixectomy performed.   Musculoskeletal exam: Musculature intact with dorsiflexion, plantarflexion, inversion, eversion. Ankle and First  MPJ joint range of motion normal.     Assessment:   ICD-10-CM   1. Ingrown right greater toenail  L60.0     2. Ingrown left greater toenail  L60.0     3. Status post nail surgery  Z98.890        Plan: Recommended continued soaks every other day. May leave open to air at night. Recommended mupirocin ointment and a prescription was called in .  He will moniter for signs of infection and if noticed will call to be seen. Otherwise these should go on to heal uneventfully over the next couple weeks.

## 2022-10-04 NOTE — Progress Notes (Signed)
Chief Complaint  Patient presents with   Ingrown Toenail     Bilateral great toe ingrown- on going      HPI: Patient is 71 y.o. male who presents today for painful ingrow toenails in both great toeanils. He relates pain with pressure on the corners. Denies redness or drainage. Relates pain with socks or shoes. He is on a blood thinner.   Patient Active Problem List   Diagnosis Date Noted   Anemia 09/02/2022   Atrial fibrillation ([place]) 09/02/2022   Stasis dermatitis of both legs 07/29/2022   Myocarditis ([place]) 05/14/2022   Pericarditis 05/12/2022   Atrial fibrillation with rapid ventricular response ([place]) 05/12/2022   Sepsis ([place]) 05/11/2022   Elevated troponin level not due myocardial infarction 05/11/2022   Lactic acidosis 05/11/2022   Drug therapy 05/18/2021   Hypertension    Normocytic anemia    Arthritis    Bipolar 1 disorder (HCC)    Chicken pox    Chronic bronchitis (HCC)    Depression    Hyperlipidemia    Rheumatic fever    Essential hypertension    Coronary artery disease involving native coronary artery of native heart 09/06/2020   Chest pain 09/06/2020   Coronary artery disease involving native coronary artery of native heart with angina pectoris ([place]) 09/06/2020   Angina pectoris ([place]) 08/30/2020   Cardiac murmur 08/30/2020   Statin intolerance 07/31/2020   Abdominal aortic atherosclerosis ([place])    Ingrowing nail 03/12/2020   Bipolar affective disorder, currently depressed, moderate ([place]) 03/08/2020   Brain ischemia 03/08/2020   Cognitive complaints 03/08/2020   History of stroke 11/10/2019   Chronic kidney disease, stage 3a ([place]) 07/21/2019   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   Thoracic radiculopathy 06/16/2019   Intention tremor 06/16/2019   Right-sided chest wall pain 04/17/2019   Barrett's esophagus without dysplasia 03/25/2019   History of colon polyps 03/25/2019   PAD (peripheral artery disease) ([place]) 03/23/2019   BPH with urinary  obstruction 12/29/2018   GAD (generalized anxiety disorder) 11/02/2018   Rheumatoid arthritis ([place]) 43/32/9518   Lichen planus 84/16/6063   High risk medication use 03/23/2018   Seasonal allergies 03/23/2018   Long-term use of immunosuppressant medication 03/23/2018   Low testosterone 09/17/2016   Vitamin B12 deficiency 09/17/2016   Vitamin D deficiency 09/17/2016   Disc degeneration, 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   Pain of right hip joint 01/19/2016   Herpes gingivostomatitis 02/10/2015   Midline thoracic back pain 02/10/2015   Normochromic normocytic anemia 08/16/2014   Bipolar 1 disorder, mixed, moderate ([place]) 08/16/2014   Mixed hyperlipidemia 08/16/2014   Labile hypertension 08/16/2014   Gastroesophageal reflux disease 08/16/2014   Bipolar depression ([place]) 08/16/2014   GERD without esophagitis 08/16/2014    Current Outpatient Medications on File Prior to Visit  Medication Sig Dispense Refill   apixaban (ELIQUIS) 5 MG TABS tablet Take 1 tablet (5 mg total) by mouth 2 (two) times daily. 60 tablet 3   Cholecalciferol 50 MCG (2000 UT) CAPS Take 2,000 Units by mouth daily.      Coenzyme Q10 (COQ10) 200 MG CAPS Take 200 mg by mouth daily.     diclofenac Sodium (VOLTAREN) 1 % GEL Apply 1 application  topically daily as needed for pain.     hydrALAZINE (APRESOLINE) 50 MG tablet Take 50 mg by mouth 3 (three) times daily.     influenza vaccine adjuvanted (FLUAD QUADRIVALENT) 0.5 ML injection Inject into the muscle. 0.5 mL 0   lithium carbonate 150 MG capsule TAKE THREE  CAPSULES BY MOUTH DAILY 270 capsule 1   losartan (COZAAR) 100 MG tablet Take 100 mg by mouth daily.     Lysine HCl 1000 MG TABS Take 1,000 mg by mouth 2 (two) times daily.      metoprolol succinate (TOPROL-XL) 25 MG 24 hr tablet Take 1 tablet (25 mg total) by mouth daily. 30 tablet 3   nitroGLYCERIN (NITROSTAT) 0.4 MG SL tablet Place 1 tablet (0.4 mg total) under the tongue every 5 (five) minutes as needed for chest pain. 25 tablet 8    pantoprazole (PROTONIX) 40 MG tablet Take 1 tablet (40 mg total) by mouth 2 (two) times daily. 180 tablet 2   Tocilizumab (ACTEMRA ACTPEN) 162 MG/0.9ML SOAJ Inject 162 mg into the skin every 14 (fourteen) days. 5.4 mL 0   vitamin B-12 (CYANOCOBALAMIN) 1000 MCG tablet Take 1,000 mcg by mouth daily.     No current facility-administered medications on file prior to visit.    Allergies  Allergen Reactions   Methotrexate Dermatitis    Developed Mouth Sores   Nsaids Dermatitis    Developed Mouth Blisters   Plaquenil [Hydroxychloroquine] Other (See Comments)    Terrible Nightmares   Azulfidine [Sulfasalazine] Nausea Only   Crestor [Rosuvastatin] Other (See Comments)    Caused abdominal pain that radiated to the back   Isosorbide Other (See Comments)    Made him feel weak   Mevacor [Lovastatin] Other (See Comments)    Caused abdominal pain that radiated to the back   Pravachol [Pravastatin] Other (See Comments)    Caused abdominal pain that radiated to the back    Review of Systems No fevers, chills, nausea, muscle aches, no difficulty breathing, no calf pain, no chest pain or shortness of breath.   Physical Exam  GENERAL APPEARANCE: Alert, conversant. Appropriately groomed. No acute distress.   VASCULAR: Pedal pulses palpable 2/4 DP and 1/4 PT bilateral.  Capillary refill time is immediate to all digits,  Proximal to distal cooling is warm to warm.  Digital perfusion adequate.   NEUROLOGIC: sensation is intact to 5.07 monofilament at 5/5 sites bilateral.  Light touch is intact bilateral, vibratory sensation intact bilateral  MUSCULOSKELETAL: acceptable muscle strength, tone and stability bilateral.  No gross boney pedal deformities noted.  No pain, crepitus or limitation noted with foot and ankle range of motion bilateral.   DERMATOLOGIC: skin is warm, supple, and dry.  Color, texture, and turgor of skin within normal limits. Bilateral great toenails have incurvated medial and  lateral borders. Discomfort with pressure along the sides is noted.  No redness, no swelling, no sign of infection noted.    Assessment     ICD-10-CM   1. Ingrown right greater toenail  L60.0     2. Ingrown left greater toenail  L60.0        Plan  Treatment options and alternatives were discussed. Recommended a permanent removal of the medial and lateral nail border of the Great toenails bilateral Patient agreed. Skin was prepped with alcohol and a local injection of lidocaine and Marcaine plain was infiltrated to anesthetize the toe. The toe was then prepped with Betadine and exsanguinated. The offending nail borders were removed and phenol applied to the exposed matrix tissue.  The area was then cleansed well with alcohol.  Antibiotic ointment and a dressing was then applied the tourniquet released  noting a prompt hyperemic response to the tip of the toe.  Oral and written instructions were dispensed and the patient was instructed on  aftercare.  He will be seen back for a recheck in 1-2 weeks. He will call sooner if needed.

## 2022-10-10 ENCOUNTER — Encounter: Payer: Self-pay | Admitting: Psychiatry

## 2022-10-10 ENCOUNTER — Ambulatory Visit: Payer: PPO | Admitting: Psychiatry

## 2022-10-10 DIAGNOSIS — F411 Generalized anxiety disorder: Secondary | ICD-10-CM | POA: Diagnosis not present

## 2022-10-10 DIAGNOSIS — N1831 Chronic kidney disease, stage 3a: Secondary | ICD-10-CM | POA: Diagnosis not present

## 2022-10-10 DIAGNOSIS — F3132 Bipolar disorder, current episode depressed, moderate: Secondary | ICD-10-CM

## 2022-10-10 DIAGNOSIS — Z79899 Other long term (current) drug therapy: Secondary | ICD-10-CM | POA: Diagnosis not present

## 2022-10-10 DIAGNOSIS — R251 Tremor, unspecified: Secondary | ICD-10-CM | POA: Diagnosis not present

## 2022-10-10 NOTE — Patient Instructions (Signed)
Ask nephrologist if you can take high dose B6 500 mg BID for tremor

## 2022-10-10 NOTE — Progress Notes (Signed)
Noah Grant [id number] Mar 06, 1951 71 y.o.  Subjective:   Patient ID:  Noah Grant is a 71 y.o. (DOB 04-20-1951) male.  Chief Complaint:  Chief Complaint  Patient presents with   Follow-up    Bipolar disorder with moderate depression ([place])   Depression   Anxiety   Stress    health    Depression        Associated symptoms include fatigue.  Associated symptoms include no decreased concentration and no suicidal ideas.  Past medical history includes anxiety.   Anxiety Symptoms include nervous/anxious behavior. Patient reports no chest pain, confusion, decreased concentration or suicidal ideas.    Medication Refill Associated symptoms include arthralgias and fatigue. Pertinent negatives include no chest pain or weakness.   Noah Grant presents to the office today for follow-up of bipolar, GAD, poor STM.  seen May 18, 2019 and 11/17/2019 without med changes.    05/17/2020 appointment with the following noted: Word recall problems with neuropsych testing by Dr. Norton Pastel inconclusive results.  Test reviewed. Still tremor can interfere with eating. Nocturia Dt overactive bladder. Overall mentally ok but too many medical concerns with RA and muscle pain.   Chronic pain issues ongoing.   Htn controlled.  Plan: Lithium level from January 2021 0.8 on 600 mg daily. Gradually it's crept up.  Disc in detail it's relation to renal function and Cr has increased gradually. BC tremor & jerks reduce to 450 mg daily.  02/12/2021 appointment with the following noted: Reduced ithium with no change in tremor. End of December, CVD stent placement and reaction to contrast dye.   Cardiologists ask if he can come off CBZ.   DT DDI. In the last year has felet mentally better than ever.   Plan: Reduce carbamazepine by 1/2 tablet every week until off of it.  04/09/2021 appointment with the following noted: Off CBZ and didn't have any problems.  No worse for mood or anxiety.  Never slept good  in life.  Melatonin caused dizziness. Checked lithium level. 06  No change in tremors in a long time.  [place]inly noticeable with fine motor control. Depression is somewhat chronic and waxes and wanes dependent on health issues.  Avoids stressors if possible.  Plan no med changes  10/10/21 appt noted: No Covid.  Hx TIA's.  Tremor seems a little worse but also has RA and started infusions last week. Continues lithium 450 mg HS as only psych med. Patient reports stable mood and denie irritable moods except as noted.  Patient denies any recent difficulty with anxiety except situational.  Patient denies unusual difficulty with sleep initiation or maintenance except awakens with pain chronically. Denies appetite disturbance.  Patient reports that energy and motivation have been good.  Patient denies any difficulty with concentration  But history of word finding from ministrokes..  Patient denies any suicidal ideation.  04/09/22 appt noted: Doing well overall.  RA is a lot worse and changing rheumatologist.  Is tired and anemic with the meds. Mood overall is stable and pleased.  Deaf in left ear.  Tried hearing aids and overall minimal improvement.   Continues  lithium 450 mg HS as only psych med. Tremor is some worse. History TIA's or ministrokes may be contributing.  10/10/22 appt noted: Continues  lithium 450 mg HS as only psych med. 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 Wife survived breast CA. Decline in kidney function.  Pending FU in November. Mood has been ok.  Anxiety is manageable.  Tremor is worse and worse as day progresses  Has seen nephorologists and rheumatologists and neurologist, [facility].   Lives in [place] now  Previous psych med trials are extensive and include meds for anxiety and mood.  These include  clonidine, buspirone,  Abilify 5 mg,  olanzapine, Geodon, Depakote, CBZ, gabapentin sedation , lamotrigine, bupropion, Strattera,  Prozac,  Serzone 700 mg daily, sertraline, duloxetine,  Pristiq which caused rage, mirtazapine, Pamelor, amitriptyline 450 mg/day, Ritalin, Provigil,  temazepam,  Ambien, ProSom, trazodone which was ineffective.    Cerefolin NAC,  Thinks he tried propranolol which caused back pain  Review of Systems:  Review of Systems  Constitutional:  Positive for fatigue.  Cardiovascular:  Negative for chest pain.  Musculoskeletal:  Positive for arthralgias and gait problem.  Neurological:  Positive for tremors. Negative for weakness.       Occ jerks  Psychiatric/Behavioral:  Negative for agitation, behavioral problems, confusion, decreased concentration, dysphoric mood, hallucinations, self-injury, sleep disturbance and suicidal ideas. The patient is nervous/anxious. The patient is not hyperactive.     Medications: I have reviewed the patient's current medications.  Current Outpatient Medications  Medication Sig Dispense Refill   amLODipine (NORVASC) 10 MG tablet TAKE ONE TABLET BY MOUTH DAILY 90 tablet 2   apixaban (ELIQUIS) 5 MG TABS tablet Take 1 tablet (5 mg total) by mouth 2 (two) times daily. 60 tablet 3   Cholecalciferol 50 MCG (2000 UT) CAPS Take 2,000 Units by mouth daily.      Coenzyme Q10 (COQ10) 200 MG CAPS Take 200 mg by mouth daily.     diclofenac Sodium (VOLTAREN) 1 % GEL Apply 1 application  topically daily as needed for pain.     ezetimibe (ZETIA) 10 MG tablet Take 1 tablet (10 mg total) by mouth daily. 90 tablet 2   hydrALAZINE (APRESOLINE) 50 MG tablet Take 50 mg by mouth 3 (three) times daily.     influenza vaccine adjuvanted (FLUAD QUADRIVALENT) 0.5 ML injection Inject into the muscle. 0.5 mL 0   lithium carbonate 150 MG capsule TAKE THREE CAPSULES BY MOUTH DAILY 270 capsule 1   losartan (COZAAR) 100 MG tablet Take 100 mg by mouth daily.     Lysine HCl 1000 MG TABS Take 1,000 mg by mouth 2 (two) times daily.      metoprolol succinate (TOPROL-XL) 25 MG 24 hr tablet Take 1 tablet (25 mg  total) by mouth daily. 30 tablet 3   mupirocin ointment (BACTROBAN) 2 % Apply 1 Application topically 2 (two) times daily. 30 g 2   nitroGLYCERIN (NITROSTAT) 0.4 MG SL tablet Place 1 tablet (0.4 mg total) under the tongue every 5 (five) minutes as needed for chest pain. 25 tablet 8   pantoprazole (PROTONIX) 40 MG tablet Take 1 tablet (40 mg total) by mouth 2 (two) times daily. 180 tablet 2   simvastatin (ZOCOR) 5 MG tablet TAKE ONE TABLET BY MOUTH DAILY 90 tablet 2   Tocilizumab (ACTEMRA ACTPEN) 162 MG/0.9ML SOAJ Inject 162 mg into the skin every 14 (fourteen) days. 5.4 mL 0   vitamin B-12 (CYANOCOBALAMIN) 1000 MCG tablet Take 1,000 mcg by mouth daily.     No current facility-administered medications for this visit.    Medication Side Effects: None  Allergies:  Allergies  Allergen Reactions   Methotrexate Dermatitis    Developed Mouth Sores   Nsaids Dermatitis    Developed Mouth Blisters   Plaquenil [Hydroxychloroquine] Other (See Comments)    Terrible Nightmares   Azulfidine [  Sulfasalazine] Nausea Only   Crestor [Rosuvastatin] Other (See Comments)    Caused abdominal pain that radiated to the back   Isosorbide Other (See Comments)    Made him feel weak   Mevacor [Lovastatin] Other (See Comments)    Caused abdominal pain that radiated to the back   Pravachol [Pravastatin] Other (See Comments)    Caused abdominal pain that radiated to the back    Past Medical History:  Diagnosis Date   Abdominal aortic atherosclerosis ([place])    Anemia    likely of chronic disease   Angina pectoris ([place]) 08/30/2020   Arthritis    Atrial fibrillation ([place])    Atrial fibrillation with rapid ventricular response ([place]) 05/12/2022   Barrett's esophagus without dysplasia 03/25/2019   Formatting of this note might be different from the original. EGD done in 02/2019. Due in 3 years.   Bipolar 1 disorder ([place])    Bipolar 1 disorder, mixed, moderate ([place]) 08/16/2014   Bipolar affective disorder,  currently depressed, moderate ([place]) 03/08/2020   Bipolar depression ([place]) 08/16/2014   BPH with urinary obstruction 12/29/2018   Brain ischemia 03/08/2020   Cardiac murmur 08/30/2020   Chest pain 09/06/2020   Chicken pox    Chronic bronchitis (HCC)    Chronic kidney disease, stage 3a ([place]) 07/21/2019   Cognitive complaints 03/08/2020   Coronary artery disease involving native coronary artery of native heart 09/06/2020   Coronary artery disease involving native coronary artery of native heart with angina pectoris ([place]) 09/06/2020   Depression    Disc degeneration, 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 Last Assessment & Plan:  Well controlled. Continue current medications.   GERD without esophagitis 08/16/2014   Formatting of this note might be different from the original. Last Assessment & Plan:  Well controlled. Continue current medications.   Herpes gingivostomatitis 02/10/2015   High risk medication use 03/23/2018   History of colon polyps 03/25/2019   Formatting of this note might be different from the original. tubular adenoma   History of stroke 11/10/2019   Hyperlipidemia    Hypertension    Ingrowing nail 03/12/2020   Intention tremor 06/16/2019   Labile hypertension 08/16/2014   Formatting of this note might be different from the original. Last Assessment & Plan:  Slightly above goal today secondary to pain. Will continue current regimen. Will check CMP today.   Lactic acidosis [id number]   Lichen planus [id number]   Last Assessment & Plan:  Formatting of this note might be different from the original. Concern over possible lichen planus. His dentist noticed lesions of his oral mucosa.  It was felt to be lichen planus.  He presents to  discuss further.  Rarely does the area hurt.He smoked in the distant past EXAM shows several white patches on the buccal mucosa bilaterally.  No other concerning oral mucosal les   Long-term use of immunosuppressant medication 03/23/2018   Low testosterone 09/17/2016   Midline thoracic back pain 02/10/2015   Mixed hyperlipidemia 08/16/2014   Myocarditis ([place]) 05/14/2022   Normochromic normocytic anemia 08/16/2014   Normocytic anemia    likely of chronic disease   PAD (peripheral artery disease) ([place]) 03/23/2019   Pain of right hip joint 01/19/2016   Pericarditis 05/12/2022   Rheumatic fever  Rheumatoid arthritis ([place]) 10/29/2018   Formatting of this note might be different from the original. Rheum:  Dr. Gerilyn Nestle Formerly on MTX - stopped due to mouth sores. Currently started on Arava.  Also on 5 mg prednisone x 3 months during run-in.   Right-sided chest wall pain 04/17/2019   Seasonal allergies 03/23/2018   Sepsis ([place]) 05/11/2022   Stasis dermatitis of both legs 07/29/2022   Statin intolerance 07/31/2020   Task-specific dystonia of hand    right hand   Thoracic radiculopathy 06/16/2019   Vitamin B12 deficiency    Injections   Vitamin D deficiency 09/17/2016    Family History  Problem Relation Age of Onset   Alzheimer's disease Mother 3       Deceased in early 77s   Stroke Father 56       Deceased   Hypertension Father    Alcoholism Father    Alcoholism Brother    Heart disease Maternal Grandfather 56       Deceased   Heart disease Paternal Grandfather 27       Deceased   Hypertension Son      Past Medical History, Surgical history, Social history, and Family history were reviewed and updated as appropriate.   Please see review of systems for further details on the patient's review from today.   Objective:   Physical Exam:  There were no vitals taken for this visit.  Physical Exam Constitutional:      General: He is not in acute distress.    Appearance: He is  well-developed.  Musculoskeletal:        General: No deformity.  Neurological:     Mental Status: He is alert and oriented to person, place, and time.     Coordination: Coordination normal.  Psychiatric:        Attention and Perception: Attention normal. He is attentive.        Mood and Affect: Mood normal. Mood is not anxious or depressed. Affect is not labile, blunt, angry or inappropriate.        Speech: Speech normal.        Behavior: Behavior normal.        Thought Content: Thought content normal. Thought content is not delusional. Thought content does not include homicidal or suicidal ideation. Thought content does not include suicidal plan.        Cognition and Memory: Cognition normal.        Judgment: Judgment normal.     Comments: Insight is good. Good humor.     Lab Review:     Component Value Date/Time   NA 139 07/29/2022 1525   NA 141 11/28/2020 0841   K 5.2 07/29/2022 1525   CL 107 07/29/2022 1525   CO2 25 07/29/2022 1525   GLUCOSE 93 07/29/2022 1525   BUN 34 (H) 07/29/2022 1525   BUN 17 11/28/2020 0841   CREATININE 1.75 (H) 07/29/2022 1525   CALCIUM 9.5 07/29/2022 1525   PROT 6.2 07/29/2022 1525   PROT 5.9 (L) 11/28/2020 0841   ALBUMIN 2.8 (L) 05/12/2022 0201   ALBUMIN 4.2 11/28/2020 0841   AST 20 07/29/2022 1525   ALT 39 07/29/2022 1525   ALKPHOS 44 05/12/2022 0201   BILITOT 0.3 07/29/2022 1525   BILITOT 0.3 11/28/2020 0841   GFRNONAA 59 (L) 05/15/2022 0306   GFRAA 75 11/28/2020 0841       Component Value Date/Time   WBC 4.9 07/29/2022 1525   RBC 3.59 (L) 07/29/2022 1525  HGB 11.0 (L) 07/29/2022 1525   HGB 10.8 (L) 08/30/2020 1139   HCT 33.1 (L) 07/29/2022 1525   HCT 32.4 (L) 08/30/2020 1139   PLT 206 07/29/2022 1525   PLT 212 08/30/2020 1139   MCV 92.2 07/29/2022 1525   MCV 92 08/30/2020 1139   MCH 30.6 07/29/2022 1525   MCHC 33.2 07/29/2022 1525   RDW 13.3 07/29/2022 1525   RDW 12.0 08/30/2020 1139   LYMPHSABS 573 (L) 07/29/2022 1525    LYMPHSABS 0.5 (L) 08/30/2020 1139   MONOABS 1.3 (H) 05/12/2022 0201   EOSABS 279 07/29/2022 1525   EOSABS 0.3 08/30/2020 1139   BASOSABS 49 07/29/2022 1525   BASOSABS 0.1 08/30/2020 1139    Lithium Lvl  Date Value Ref Range Status  05/12/2022 0.45 (L) 0.60 - 1.20 mmol/L Final    Comment:    Performed at [facility], [place] [address], [place], [place] [address]     No results found for: "PHENYTOIN", "PHENOBARB", "VALPROATE", "CBMZ"   .res Assessment: Plan:    Bipolar disorder with moderate depression ([place])  Generalized anxiety disorder  Lithium use  Tremor  Chronic kidney disease, stage 3a ([place])   He has failed multiple other psychiatric medications and does not wish to have further med changes today. Only current psych med is lithium 450 mg daily .  And his mood is good and stable.  Lithium level from January 2021 0.8 on 600 mg daily. Gradually it's crept up.  Disc in detail it's relation to renal function and Cr has increased gradually. CR 1.35 01/18/21  Creatinine 1.75 07/2022 Continue lithium 450 mg daily but tremor did not get better with reduction last visit. Lithium level 10/18/21 = 0.6, 05/12/22 lithium =0.45  Counseled patient regarding potential benefits, risks, and side effects of lithium to include potential risk of lithium affecting thyroid and renal function.  Discussed need for periodic lab monitoring to determine drug level and to assess for potential adverse effects.  Counseled patient regarding signs and symptoms of lithium toxicity and advised that they notify office immediately or seek urgent medical attention if experiencing these signs and symptoms.  Patient advised to contact office with any questions or concerns.  Disc risk mania from occ use of prednisone.  He does feel edgy on it.  Can't use propranolol for tremor DT multiple BP meds. Conisder high dose B6.  He'll ask nephrologist.  No changes indicated.  He's failed multiple others.  3-4  mos  Lynder Parents, MD, DFAPA   Please see After Visit Summary for patient specific instructions.  Future Appointments  Date Time Provider [place]  10/21/2022 11:00 AM [name], DO [facility]  10/29/2022 11:00 AM Rice, Resa Miner, MD [facility] None    No orders of the defined types were placed in this encounter.      -------------------------------

## 2022-10-18 ENCOUNTER — Ambulatory Visit: Payer: PPO | Admitting: Family Medicine

## 2022-10-21 ENCOUNTER — Ambulatory Visit (INDEPENDENT_AMBULATORY_CARE_PROVIDER_SITE_OTHER): Payer: PPO | Admitting: Family Medicine

## 2022-10-21 ENCOUNTER — Encounter: Payer: Self-pay | Admitting: Family Medicine

## 2022-10-21 VITALS — BP 130/78 | HR 48 | Temp 98.7°F | Ht 67.0 in | Wt 167.0 lb

## 2022-10-21 DIAGNOSIS — R351 Nocturia: Secondary | ICD-10-CM | POA: Diagnosis not present

## 2022-10-21 MED ORDER — FINASTERIDE 5 MG PO TABS: 5.0000 mg | ORAL_TABLET | Freq: Every day | ORAL | 2 refills | Status: DC

## 2022-10-21 NOTE — Progress Notes (Signed)
Chief Complaint  Patient presents with   Follow-up    Subjective: Patient is a 71 y.o. male here for follow-up.  He was seen around a month ago for fatigue.  He does not sleep very long at night and does not feel particularly rested during the day.  He has to wake up frequently to urinate.  He has failed Flomax and various overactive bladder medications.  He does follow with urology.  He noticed no improvement after decreasing his metoprolol strength from 50 mg daily to 25 mg daily.  Past Medical History:  Diagnosis Date   Abdominal aortic atherosclerosis (HCC)    Anemia    likely of chronic disease   Angina pectoris (HCC) 08/30/2020   Arthritis    Atrial fibrillation (HCC)    Atrial fibrillation with rapid ventricular response (HCC) 05/12/2022   Barrett's esophagus without dysplasia 03/25/2019   Formatting of this note might be different from the original. EGD done in 02/2019. Due in 3 years.   Bipolar 1 disorder (HCC)    Bipolar 1 disorder, mixed, moderate (HCC) 08/16/2014   Bipolar affective disorder, currently depressed, moderate (HCC) 03/08/2020   Bipolar depression (HCC) 08/16/2014   BPH with urinary obstruction 12/29/2018   Brain ischemia 03/08/2020   Cardiac murmur 08/30/2020   Chest pain 09/06/2020   Chicken pox    Chronic bronchitis (HCC)    Chronic kidney disease, stage 3a (HCC) 07/21/2019   Cognitive complaints 03/08/2020   Coronary artery disease involving native coronary artery of native heart 09/06/2020   Coronary artery disease involving native coronary artery of native heart with angina pectoris (HCC) 09/06/2020   Depression    Disc degeneration, 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 Last Assessment & Plan:  Well  controlled. Continue current medications.   GERD without esophagitis 08/16/2014   Formatting of this note might be different from the original. Last Assessment & Plan:  Well controlled. Continue current medications.   Herpes gingivostomatitis 02/10/2015   High risk medication use 03/23/2018   History of colon polyps 03/25/2019   Formatting of this note might be different from the original. tubular adenoma   History of stroke 11/10/2019   Hyperlipidemia    Hypertension    Ingrowing nail 03/12/2020   Intention tremor 06/16/2019   Labile hypertension 08/16/2014   Formatting of this note might be different from the original. Last Assessment & Plan:  Slightly above goal today secondary to pain. Will continue current regimen. Will check CMP today.   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 Concern over possible lichen planus. His dentist noticed lesions of his oral mucosa.  It was felt to be lichen planus.  He presents to discuss further.  Rarely does the area hurt.He smoked in the distant past EXAM shows several white patches on the buccal mucosa bilaterally.  No other concerning oral mucosal les   Long-term use of immunosuppressant medication 03/23/2018   Low testosterone 09/17/2016   Midline thoracic back pain 02/10/2015   Mixed hyperlipidemia 08/16/2014   Myocarditis (HCC) 05/14/2022   Normochromic normocytic anemia 08/16/2014   Normocytic anemia    likely of chronic disease   PAD (peripheral  artery disease) (HCC) 03/23/2019   Pain of right hip joint 01/19/2016   Pericarditis 05/12/2022   Rheumatic fever    Rheumatoid arthritis (HCC) 10/29/2018   Formatting of this note might be different from the original. Rheum:  Dr. Sharmon Revere Formerly on MTX - stopped due to mouth sores. Currently started on Arava.  Also on 5 mg prednisone x 3 months during run-in.   Right-sided chest wall pain 04/17/2019    Seasonal allergies 03/23/2018   Sepsis (HCC) 05/11/2022   Stasis dermatitis of both legs 07/29/2022   Statin intolerance 07/31/2020   Task-specific dystonia of hand    right hand   Thoracic radiculopathy 06/16/2019   Vitamin B12 deficiency    Injections   Vitamin D deficiency 09/17/2016    Objective: BP 130/78 (BP Location: Left Arm, Patient Position: Sitting, Cuff Size: Normal)   Pulse (!) 48   Temp 98.7 F (37.1 C) (Oral)   Ht 5\' 7"  (1.702 m)   Wt 167 lb (75.8 kg)   SpO2 97%   BMI 26.16 kg/m  General: Awake, appears stated age Heart: Regular rhythm, bradycardic, no LE edema Lungs: CTAB, no rales, wheezes or rhonchi. No accessory muscle use Psych: Age appropriate judgment and insight, normal affect and mood  Assessment and Plan: Nocturia - Plan: finasteride (PROSCAR) 5 MG tablet  Chronic, unstable.  Start finasteride 5 mg daily.  Could consider a daily medication to help with sleep as well.  Follow-up in 1 month. The patient voiced understanding and agreement to the plan.  [place], DO 10/21/22  1:18 PM

## 2022-10-21 NOTE — Patient Instructions (Signed)
Stay hydrated, but decrease fluid intake within 1-2 hrs before bed.  Let us know if you need anything.

## 2022-10-22 DIAGNOSIS — N1831 Chronic kidney disease, stage 3a: Secondary | ICD-10-CM | POA: Diagnosis not present

## 2022-10-29 ENCOUNTER — Ambulatory Visit: Payer: PPO | Attending: Internal Medicine | Admitting: Internal Medicine

## 2022-10-29 ENCOUNTER — Encounter: Payer: Self-pay | Admitting: Internal Medicine

## 2022-10-29 VITALS — BP 149/66 | HR 49 | Resp 17 | Ht 67.0 in | Wt 169.4 lb

## 2022-10-29 DIAGNOSIS — I872 Venous insufficiency (chronic) (peripheral): Secondary | ICD-10-CM | POA: Diagnosis not present

## 2022-10-29 DIAGNOSIS — I309 Acute pericarditis, unspecified: Secondary | ICD-10-CM | POA: Diagnosis not present

## 2022-10-29 DIAGNOSIS — Z7960 Long term (current) use of unspecified immunomodulators and immunosuppressants: Secondary | ICD-10-CM

## 2022-10-29 DIAGNOSIS — M069 Rheumatoid arthritis, unspecified: Secondary | ICD-10-CM

## 2022-10-29 NOTE — Progress Notes (Signed)
Office Visit Note  Patient: Noah Grant             Date of Birth: 1950-12-20           MRN: [id number]             PCP: Sharlene Dory, DO Referring: Sharlene Dory* Visit Date: 10/29/2022   Subjective:   History of Present Illness: Noah Grant is a 71 y.o. 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  He completed the colchicine and is now on just the Actemra treatment.  He is feeling very fatigued limiting his overall activity level.  He is also been experiencing some additional pain and stiffness in multiple areas.  He had increased swelling in both legs accumulating towards the feet and ankles.  He has had adjustment to beta blocker treatment currently prescribed metoprolol XR 25 mg daily. Bradycardia and heart rate improved now often in the 50s did not notice any difference in the peripheral swelling after the medicine change. He is also still on norvasc and hydralazine for blood pressure control.   Previous HPI 05/30/22 Noah Grant is a 71 y.o. male here for follow up for RA he was on leflunomide recently hospitalized with sepsis and pericarditis without clear underlying infection identified.  Symptoms started with chills developed increasing swelling in his hands and feet with joint pain that was worse in the shoulders and hips.  He developed severe chest pain felt a constriction or tightness around the heart.  The chest pain was sharp did report provocation with deep breaths or lying in left or right positions.  Evaluation was consistent with pericarditis with effusion.  He was discharged on colchicine and received short term steroids and the chest pain is currently doing well.  The flareup of joint pain has also improved with the oral anti-inflammatory medication.   Previous HPI 04/19/22 Noah Grant is a 71 y.o. male here for rheumatoid arthritis previously seeing Dr. Sharmon Revere and on treatment  with leflunomide.  Symptoms started a long time ago gradual onset but has been seeing trucks for treatment of this in the past decade or so.  He has joint pain and stiffness involving multiple areas.  Shoulders, wrists, hands, also bilateral hip pains.  Most commonly sees visible swelling or inflammation in his hands.  He has morning stiffness very severe for 30 minutes to an hour but keeps persistent joint stiffness throughout the day.  Also has pain increased when trying to lie still at night.  He has tried a number of treatments for this over multiple years.  He did not tolerate hydroxychloroquine due to confusion, MTX not tolerated, leflunomide leukopenia, and sulfasalazine mood disturbances.  He never experienced a significant improvement with Humira injection and developed acute conjunctivitis shortly after starting Remicade infusion.   DMARD Hx LEF current MTX GI intolerance HCQ Psychiatric side effects SSZ Psychiatric side effects Humira nonresponder Remicade nonresponder and conjunctivitis   12/2021 Cholesterol 107 TGs 193 HDL 28.9   09/2021 HBV neg HCV neg   Review of Systems  Constitutional:  Positive for fatigue.  HENT:  Negative for mouth sores and mouth dryness.   Eyes:  Positive for dryness.  Respiratory:  Negative for shortness of breath.   Cardiovascular:  Positive for chest pain. Negative for palpitations.  Gastrointestinal:  Negative for blood in stool, constipation and diarrhea.  Endocrine: Positive for increased urination.  Genitourinary:  Negative for  involuntary urination.  Musculoskeletal:  Positive for joint pain, gait problem, joint pain, joint swelling, myalgias, muscle weakness, morning stiffness, muscle tenderness and myalgias.  Skin:  Negative for color change, rash, hair loss and sensitivity to sunlight.  Allergic/Immunologic: Negative for susceptible to infections.  Neurological:  Negative for dizziness and headaches.  Hematological:  Negative for  swollen glands.  Psychiatric/Behavioral:  Positive for sleep disturbance. Negative for depressed mood. The patient is not nervous/anxious.     PMFS History:  Patient Active Problem List   Diagnosis Date Noted   Anemia 09/02/2022   Atrial fibrillation (HCC) 09/02/2022   Stasis dermatitis of both legs 07/29/2022   Myocarditis (HCC) 05/14/2022   Pericarditis 05/12/2022   Atrial fibrillation with rapid ventricular response (HCC) 05/12/2022   Sepsis (HCC) 05/11/2022   Elevated troponin level not due myocardial infarction 05/11/2022   Lactic acidosis 05/11/2022   Drug therapy 05/18/2021   Hypertension    Normocytic anemia    Arthritis    Bipolar 1 disorder (HCC)    Chicken pox    Chronic bronchitis (HCC)    Depression    Hyperlipidemia    Rheumatic fever    Essential hypertension    Coronary artery disease involving native coronary artery of native heart 09/06/2020   Chest pain 09/06/2020   Coronary artery disease involving native coronary artery of native heart with angina pectoris (HCC) 09/06/2020   Angina pectoris (HCC) 08/30/2020   Cardiac murmur 08/30/2020   Statin intolerance 07/31/2020   Abdominal aortic atherosclerosis (HCC)    Ingrowing nail 03/12/2020   Bipolar affective disorder, currently depressed, moderate (HCC) 03/08/2020   Brain ischemia 03/08/2020   Cognitive complaints 03/08/2020   History of stroke 11/10/2019   Chronic kidney disease, stage 3a (HCC) 07/21/2019   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   Thoracic radiculopathy 06/16/2019   Intention tremor 06/16/2019   Right-sided chest wall pain 04/17/2019   Barrett's esophagus without dysplasia 03/25/2019   History of colon polyps 03/25/2019   PAD (peripheral artery disease) (HCC) 03/23/2019   BPH with urinary obstruction 12/29/2018   GAD (generalized anxiety disorder) 11/02/2018   Rheumatoid arthritis (HCC) 10/29/2018   Lichen planus 04/07/2018   High risk medication use 03/23/2018   Seasonal  allergies 03/23/2018   Long-term use of immunosuppressant medication 03/23/2018   Low testosterone 09/17/2016   Vitamin B12 deficiency 09/17/2016   Vitamin D deficiency 09/17/2016   Disc degeneration, 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   Pain of right hip joint 01/19/2016   Herpes gingivostomatitis 02/10/2015   Midline thoracic back pain 02/10/2015   Normochromic normocytic anemia 08/16/2014   Bipolar 1 disorder, mixed, moderate (HCC) 08/16/2014   Mixed hyperlipidemia 08/16/2014   Labile hypertension 08/16/2014   Gastroesophageal reflux disease 08/16/2014   Bipolar depression (HCC) 08/16/2014   GERD without esophagitis 08/16/2014    Past Medical History:  Diagnosis Date   Abdominal aortic atherosclerosis (HCC)    Anemia    likely of chronic disease   Angina pectoris (HCC) 08/30/2020   Arthritis    Atrial fibrillation (HCC)    Atrial fibrillation with rapid ventricular response (HCC) 05/12/2022   Barrett's esophagus without dysplasia 03/25/2019   Formatting of this note might be different from the original. EGD done in 02/2019. Due in 3 years.   Bipolar 1 disorder (HCC)    Bipolar 1 disorder, mixed, moderate (HCC) 08/16/2014   Bipolar affective disorder, currently depressed, moderate (HCC) 03/08/2020   Bipolar depression (HCC) 08/16/2014   BPH with urinary obstruction 12/29/2018  Brain ischemia 03/08/2020   Cardiac murmur 08/30/2020   Chest pain 09/06/2020   Chicken pox    Chronic bronchitis (HCC)    Chronic kidney disease, stage 3a (HCC) 07/21/2019   Cognitive complaints 03/08/2020   Coronary artery disease involving native coronary artery of native heart 09/06/2020   Coronary artery disease involving native coronary artery of native heart with angina pectoris (HCC) 09/06/2020   Depression    Disc degeneration, 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   Drug therapy 05/18/2021   Elevated troponin level not due myocardial infarction 05/11/2022   Essential hypertension    GAD (generalized anxiety disorder) 11/02/2018    Gastroesophageal reflux disease 08/16/2014   Formatting of this note might be different from the original. Last Assessment & Plan:  Well controlled. Continue current medications.   GERD without esophagitis 08/16/2014   Formatting of this note might be different from the original. Last Assessment & Plan:  Well controlled. Continue current medications.   Herpes gingivostomatitis 02/10/2015   High risk medication use 03/23/2018   History of colon polyps 03/25/2019   Formatting of this note might be different from the original. tubular adenoma   History of stroke 11/10/2019   Hyperlipidemia    Hypertension    Ingrowing nail 03/12/2020   Intention tremor 06/16/2019   Labile hypertension 08/16/2014   Formatting of this note might be different from the original. Last Assessment & Plan:  Slightly above goal today secondary to pain. Will continue current regimen. Will check CMP today.   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 Concern over possible lichen planus. His dentist noticed lesions of his oral mucosa.  It was felt to be lichen planus.  He presents to discuss further.  Rarely does the area hurt.He smoked in the distant past EXAM shows several white patches on the buccal mucosa bilaterally.  No other concerning oral mucosal les   Long-term use of immunosuppressant medication 03/23/2018   Low testosterone 09/17/2016   Midline thoracic back pain 02/10/2015   Mixed hyperlipidemia 08/16/2014   Myocarditis (HCC) 05/14/2022   Normochromic normocytic anemia 08/16/2014   Normocytic anemia    likely of chronic disease   PAD (peripheral artery disease) (HCC) 03/23/2019   Pain of right hip joint 01/19/2016   Pericarditis 05/12/2022   Rheumatic fever    Rheumatoid arthritis (HCC) 10/29/2018   Formatting of this note might be different from the original. Rheum:  Dr. Sharmon Revere Formerly on MTX - stopped due  to mouth sores. Currently started on Arava.  Also on 5 mg prednisone x 3 months during run-in.   Right-sided chest wall pain 04/17/2019   Seasonal allergies 03/23/2018   Sepsis (HCC) 05/11/2022   Stasis dermatitis of both legs 07/29/2022   Statin intolerance 07/31/2020   Task-specific dystonia of hand    right hand   Thoracic radiculopathy 06/16/2019   Vitamin B12 deficiency    Injections   Vitamin D deficiency 09/17/2016    Family History  Problem Relation Age of Onset   Alzheimer's disease Mother 8       Deceased in early 62s   Stroke Father 36       Deceased   Hypertension Father    Alcoholism Father    Alcoholism Brother    Heart disease Maternal Grandfather 71       Deceased   Heart disease Paternal Grandfather 33       Deceased  Hypertension Son    Past Surgical History:  Procedure Laterality Date   CLAVICLE SURGERY     Right, Hardware placed   CORONARY STENT INTERVENTION N/A 09/06/2020   Procedure: CORONARY STENT INTERVENTION;  Surgeon: Tonny Bollman, MD;  Location: [facility] INVASIVE CV LAB;  Service: Cardiovascular;  Laterality: N/A;   ingrown toenail Bilateral    great toes   LEFT HEART CATH AND CORONARY ANGIOGRAPHY N/A 09/06/2020   Procedure: LEFT HEART CATH AND CORONARY ANGIOGRAPHY;  Surgeon: Tonny Bollman, MD;  Location: [facility] INVASIVE CV LAB;  Service: Cardiovascular;  Laterality: N/A;   WISDOM TOOTH EXTRACTION     Social History   Social History Narrative   Not on file   Immunization History  Administered Date(s) Administered   Fluad Quad(high Dose 65+) 09/03/2021, 09/04/2022   Influenza, High Dose Seasonal PF 09/10/2019, 09/03/2020   Influenza,inj,Quad PF,6+ Mos 08/22/2014, 10/04/2015   Moderna SARS-COV2 Booster Vaccination 10/31/2020, 07/04/2021   Moderna Sars-Covid-2 Vaccination 01/17/2020, 02/13/2020   PNEUMOCOCCAL CONJUGATE-20 01/02/2022   Zoster Recombinat (Shingrix) 07/02/2017, 09/02/2017   Zoster, Live 08/17/2011     Objective: Vital Signs: BP  (!) 149/66 (BP Location: Left Arm, Patient Position: Sitting, Cuff Size: Normal)   Pulse (!) 49   Resp 17   Ht 5\' 7"  (1.702 m)   Wt 169 lb 6.4 oz (76.8 kg)   BMI 26.53 kg/m    Physical Exam Eyes:     Conjunctiva/sclera: Conjunctivae normal.  Cardiovascular:     Rate and Rhythm: Regular rhythm. Bradycardia present.  Pulmonary:     Effort: Pulmonary effort is normal.     Breath sounds: Normal breath sounds.  Skin:    General: Skin is warm and dry.     Findings: No rash.  Neurological:     Mental Status: He is alert.     Comments: Positional tremor  Psychiatric:        Mood and Affect: Mood normal.      Musculoskeletal Exam:  Shoulder stiffness very limited overhead abduction range of motion and tightly guarding against passive movement as well Elbows full ROM no tenderness or swelling Wrists full ROM no tenderness or swelling Fingers full ROM no tenderness or swelling Hip stiffness with active and passive range of motion, using arms for assistance standing from seated position but is able to rise unaided when prompted Knees full ROM no tenderness or swelling   CDAI Exam: CDAI Score: 6  Patient Global: 30 mm; Provider Global: 10 mm Swollen: 0 ; Tender: 2  Joint Exam 10/29/2022      Right  Left  Glenohumeral   Tender   Tender      Investigation: No additional findings.  Imaging: No results found.  Recent Labs: Lab Results  Component Value Date   WBC 4.7 10/29/2022   HGB 11.2 (L) 10/29/2022   PLT 210 10/29/2022   NA 139 07/29/2022   K 5.2 07/29/2022   CL 107 07/29/2022   CO2 25 07/29/2022   GLUCOSE 93 07/29/2022   BUN 34 (H) 07/29/2022   CREATININE 1.75 (H) 07/29/2022   BILITOT 0.3 07/29/2022   ALKPHOS 44 05/12/2022   AST 20 07/29/2022   ALT 39 07/29/2022   PROT 6.2 07/29/2022   ALBUMIN 2.8 (L) 05/12/2022   CALCIUM 9.5 07/29/2022   GFRAA 75 11/28/2020   QFTBGOLDPLUS NEGATIVE 04/19/2022    Speciality Comments: No specialty comments  available.  Procedures:  No procedures performed Allergies: Methotrexate, Nsaids, Plaquenil [hydroxychloroquine], Azulfidine [sulfasalazine], Crestor [rosuvastatin], Isosorbide, Mevacor [lovastatin],  and Pravachol [pravastatin]   Assessment / Plan:     Visit Diagnoses: Rheumatoid arthritis involving multiple sites, unspecified whether rheumatoid factor present (HCC) - Plan: Sedimentation rate, C-reactive protein  Is having some increase in joint pain and stiffness at multiple areas but no observable synovitis on exam today.  Will recheck sedimentation rate and CRP for disease activity assessment.  Suspect symptoms currently are more related to his osteoarthritis if markers for inflammation are high will consider low-dose steroid treatment course.  Plan to continue Actemra 162 mg subcu q. 14 days.  Long-term use of immunosuppressant medication - Plan: CBC with Differential/Platelet  Rechecking CBC for Actemra medication monitoring.  Previously had mild lymphopenia as has already gotten better compared to when he was on the higher dose colchicine.  Stasis dermatitis of both legs  He is to have mild petechial rashes and some hyperpigmentation in distal legs no severe pitting edema no induration or tenderness.  Acute pericarditis, unspecified type  Appears to be doing well with no new reported symptoms suggestive for pleuritic pain or impaired function.  May be having some issues with the bradycardia and having some ongoing medical optimization with cardiology.  Orders: Orders Placed This Encounter  Procedures   Sedimentation rate   C-reactive protein   CBC with Differential/Platelet   No orders of the defined types were placed in this encounter.    Follow-Up Instructions: Return in about 3 months (around 01/29/2023) for RA on actemra f/u 63mos.   Fuller Plan, MD  Note - This record has been created using [employer].  Chart creation errors have been sought, but may not  always  have been located. Such creation errors do not reflect on  the standard of medical care.

## 2022-10-30 DIAGNOSIS — R0989 Other specified symptoms and signs involving the circulatory and respiratory systems: Secondary | ICD-10-CM | POA: Diagnosis not present

## 2022-10-30 DIAGNOSIS — Z79899 Other long term (current) drug therapy: Secondary | ICD-10-CM

## 2022-10-30 DIAGNOSIS — N1832 Chronic kidney disease, stage 3b: Secondary | ICD-10-CM | POA: Diagnosis not present

## 2022-10-30 HISTORY — DX: Other disorders of phosphorus metabolism: E83.39

## 2022-10-30 HISTORY — DX: Other long term (current) drug therapy: Z79.899

## 2022-10-30 LAB — CBC WITH DIFFERENTIAL/PLATELET
Absolute Monocytes: 696 cells/uL (ref 200–950)
Basophils Absolute: 28 cells/uL (ref 0–200)
Basophils Relative: 0.6 %
Eosinophils Absolute: 183 cells/uL (ref 15–500)
Eosinophils Relative: 3.9 %
HCT: 33.6 % — ABNORMAL LOW (ref 38.5–50.0)
Hemoglobin: 11.2 g/dL — ABNORMAL LOW (ref 13.2–17.1)
Lymphs Abs: 465 cells/uL — ABNORMAL LOW (ref 850–3900)
MCH: 31.9 pg (ref 27.0–33.0)
MCHC: 33.3 g/dL (ref 32.0–36.0)
MCV: 95.7 fL (ref 80.0–100.0)
MPV: 11.3 fL (ref 7.5–12.5)
Monocytes Relative: 14.8 %
Neutro Abs: 3328 cells/uL (ref 1500–7800)
Neutrophils Relative %: 70.8 %
Platelets: 210 10*3/uL (ref 140–400)
RBC: 3.51 10*6/uL — ABNORMAL LOW (ref 4.20–5.80)
RDW: 11.8 % (ref 11.0–15.0)
Total Lymphocyte: 9.9 %
WBC: 4.7 10*3/uL (ref 3.8–10.8)

## 2022-10-30 LAB — C-REACTIVE PROTEIN: CRP: 0.2 mg/L (ref <8.0)

## 2022-10-30 LAB — SEDIMENTATION RATE: Sed Rate: 2 mm/h (ref 0–20)

## 2022-10-31 ENCOUNTER — Other Ambulatory Visit: Payer: Self-pay | Admitting: Family Medicine

## 2022-11-01 NOTE — Progress Notes (Signed)
Lab results look okay for continuing the Actemra.  His lymphocyte count remains mildly low at 465 down from 573 3 months ago.  But this is still a lot better than in the 200s and I do not think we need to change treatment on account of this.  Inflammatory markers are normal I do not think his current joint pain and stiffness is coming from active rheumatoid arthritis.  I do not recommend resuming systemic steroids, but if shoulder or hip pain is giving him worsening trouble could try joint injection for treatment as needed.

## 2022-11-14 ENCOUNTER — Telehealth: Payer: Self-pay | Admitting: Pharmacist

## 2022-11-14 NOTE — Telephone Encounter (Signed)
Patient was noted to have CAD / PVD but is taking low dose statin - simvastatin 5mg  daily. Ezetimibe 10mg  also is on his med list.  Reviewed patient's chart. Last LDL was 39 (01/02/2022), triglycerides = 193, total chol = 107 and HDLC = 28.9 He has tried other statins in past lovastatin, rosuvastatin and pravastatin all caused abdominal pain which radiated to his back.  LDL is below goal of < 70 but current therpay and he seems to be tolerating dose.  He will see PCP 11/20/2022, consider checking lipids.  If LDL or Tg above goals then could increase statin dose to 20mg  daily.   Called 01/04/2022 to check adherence to current medications - he filled losartan and simvastatin 10/01/2022 for 90 days and filled Eliquis for 30 day 11/02/2022 $45 per month copay for Eliquis)

## 2022-11-20 ENCOUNTER — Other Ambulatory Visit: Payer: Self-pay | Admitting: *Deleted

## 2022-11-20 ENCOUNTER — Encounter: Payer: Self-pay | Admitting: Family Medicine

## 2022-11-20 ENCOUNTER — Ambulatory Visit (INDEPENDENT_AMBULATORY_CARE_PROVIDER_SITE_OTHER): Payer: PPO | Admitting: Family Medicine

## 2022-11-20 VITALS — BP 121/81 | HR 48 | Temp 98.3°F | Ht 67.0 in | Wt 169.5 lb

## 2022-11-20 DIAGNOSIS — R001 Bradycardia, unspecified: Secondary | ICD-10-CM

## 2022-11-20 DIAGNOSIS — R351 Nocturia: Secondary | ICD-10-CM | POA: Diagnosis not present

## 2022-11-20 DIAGNOSIS — Z79899 Other long term (current) drug therapy: Secondary | ICD-10-CM

## 2022-11-20 DIAGNOSIS — N401 Enlarged prostate with lower urinary tract symptoms: Secondary | ICD-10-CM | POA: Diagnosis not present

## 2022-11-20 DIAGNOSIS — M069 Rheumatoid arthritis, unspecified: Secondary | ICD-10-CM

## 2022-11-20 MED ORDER — ACTEMRA ACTPEN 162 MG/0.9ML ~~LOC~~ SOAJ: 162.0000 mg | SUBCUTANEOUS | 0 refills | Status: DC

## 2022-11-20 NOTE — Progress Notes (Signed)
Chief Complaint  Patient presents with   Follow-up    Subjective: Patient is a 71 y.o. male here for follow-up.  Patient was seen 1 month ago and treated for nighttime urinary frequency.  He was started on finasteride 5 mg daily.  He reports compliance with no adverse effects.  He reports urinating 3 or 4 times less per night than before.  He is still urinating around 5-6 times per night.  He is sleeping better.  He has been on medication for overactive bladder previously which did not help.  He has seen the urology team who told him that he might need a prostate surgery.  Patient has a history of stage IIIb kidney disease.  His nephrologist told him she would like him to see an electrophysiologist.  She wanted to refer herself but told her to reach out to her PCP if he wanted; system, which she does.  She thinks his bradycardia is playing a role with his poor kidney function.  Past Medical History:  Diagnosis Date   Abdominal aortic atherosclerosis (HCC)    Anemia    likely of chronic disease   Angina pectoris (HCC) 08/30/2020   Arthritis    Atrial fibrillation (HCC)    Atrial fibrillation with rapid ventricular response (HCC) 05/12/2022   Barrett's esophagus without dysplasia 03/25/2019   Formatting of this note might be different from the original. EGD done in 02/2019. Due in 3 years.   Bipolar 1 disorder (HCC)    Bipolar 1 disorder, mixed, moderate (HCC) 08/16/2014   Bipolar affective disorder, currently depressed, moderate (HCC) 03/08/2020   Bipolar depression (HCC) 08/16/2014   BPH with urinary obstruction 12/29/2018   Brain ischemia 03/08/2020   Cardiac murmur 08/30/2020   Chest pain 09/06/2020   Chicken pox    Chronic bronchitis (HCC)    Chronic kidney disease, stage 3a (HCC) 07/21/2019   Cognitive complaints 03/08/2020   Coronary artery disease involving native coronary artery of native heart 09/06/2020   Coronary artery disease involving native coronary artery of native heart  with angina pectoris (HCC) 09/06/2020   Depression    Disc degeneration, 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 Last Assessment & Plan:  Well controlled. Continue current medications.   GERD without esophagitis 08/16/2014   Formatting of this note might be different from the original. Last Assessment & Plan:  Well controlled. Continue current medications.   Herpes gingivostomatitis 02/10/2015   High risk medication use 03/23/2018   History of colon polyps 03/25/2019   Formatting of this note might be different from the original. tubular adenoma   History of stroke 11/10/2019   Hyperlipidemia    Hypertension    Ingrowing nail 03/12/2020   Intention tremor 06/16/2019   Labile hypertension 08/16/2014   Formatting of this note might be different from the original. Last Assessment & Plan:  Slightly above goal today secondary to pain. Will continue current regimen. Will check CMP today.   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 Concern over possible lichen planus. His dentist noticed lesions of his oral mucosa.  It was felt to be lichen planus.  He presents to discuss further.  Rarely  does the area hurt.He smoked in the distant past EXAM shows several white patches on the buccal mucosa bilaterally.  No other concerning oral mucosal les   Long-term use of immunosuppressant medication 03/23/2018   Low testosterone 09/17/2016   Midline thoracic back pain 02/10/2015   Mixed hyperlipidemia 08/16/2014   Myocarditis (HCC) 05/14/2022   Normochromic normocytic anemia 08/16/2014   Normocytic anemia    likely of chronic disease   PAD (peripheral artery  disease) (HCC) 03/23/2019   Pain of right hip joint 01/19/2016   Pericarditis 05/12/2022   Rheumatic fever    Rheumatoid arthritis (HCC) 10/29/2018   Formatting of this note might be different from the original. Rheum:  Dr. Sharmon Revere Formerly on MTX - stopped due to mouth sores. Currently started on Arava.  Also on 5 mg prednisone x 3 months during run-in.   Right-sided chest wall pain 04/17/2019   Seasonal allergies 03/23/2018   Sepsis (HCC) 05/11/2022   Stasis dermatitis of both legs 07/29/2022   Statin intolerance 07/31/2020   Task-specific dystonia of hand    right hand   Thoracic radiculopathy 06/16/2019   Vitamin B12 deficiency    Injections   Vitamin D deficiency 09/17/2016    Objective: BP 121/81 (BP Location: Left Arm, Patient Position: Sitting, Cuff Size: Normal)   Pulse (!) 48   Temp 98.3 F (36.8 C) (Oral)   Ht 5\' 7"  (1.702 m)   Wt 169 lb 8 oz (76.9 kg)   SpO2 98%   BMI 26.55 kg/m  General: Awake, appears stated age Lungs: No accessory muscle use Psych: Age appropriate judgment and insight, normal affect and mood  Assessment and Plan: Benign prostatic hyperplasia with nocturia  Bradycardia - Plan: Ambulatory referral to Cardiac Electrophysiology  Chronic, stable.  Continue finasteride 5 mg daily.  He will let me know if he wishes to see a urologist again. Refer to EP at the patient/nephrologist wish.  Follow-up in 6 months for physical or as needed. The patient voiced understanding and agreement to the plan.  [place], DO 11/20/22  11:06 AM

## 2022-11-20 NOTE — Patient Instructions (Signed)
If you do not hear anything about your referral in the next 1-2 weeks, call our office and ask for an update.  Reach out to Dr. Dimple Casey regarding possible injections.  Let us know if you need anything.

## 2022-11-20 NOTE — Telephone Encounter (Signed)
Patient called the office requesting a refill on Actemra to be sent to [facility].   Next Visit: 01/29/2023  Last Visit: 10/29/2022  Last Fill: 09/19/2022  BH:ALPFXTKWIO arthritis involving multiple sites, unspecified whether rheumatoid factor present   Current Dose per office note 10/29/2022: Actemra 162 mg subcu q. 14 days.   Labs: 10/29/2022 RBC 3.51, Hgb 11.2, Hct 33.6, Lymphs Absolute 465, 10/22/2022 BUN 36, Glucose 111, Phosphorus 5.2, Creat. 1.93, GFR 37  TB Gold: 04/19/2022 Neg    Okay to refill Actemra?

## 2022-12-10 NOTE — Progress Notes (Signed)
ELECTROPHYSIOLOGY CONSULT NOTE  Patient ID: Noah Grant, MRN: [id number], DOB/AGE: 1951-05-03 72 y.o. Admit date: (Not on file) Date of Consult: 12/11/2022  Primary Physician: Sharlene Dory, DO Primary Cardiologist:       Noah Grant is a 72 y.o. male who is being seen today for the evaluation of bradycardia at the request of Nephrology.    HPI Noah Grant is a 72 y.o. male referred because of concerns by his nephrologist and rheumatologist that his bradycardia is contributing to weakness and worsening renal function.  Longstanding history of bradycardia as noted in the graph below.  His primary care physician had decreased his metoprolol, switch from labetalol and his hospitalization, from 50--25.  6/23 admitted to [facility] weakness tremors temperature 103.7 abdominal pain concern for sepsis with associated myopericarditis by symptoms and by cMRI with recommendation for colchicine x 3 months   following in hospital steroids.  Interval atrial fibrillation with a rapid rate. Patient has longstanding rheumatoid arthritis on leflunomide which is apparently associated with immunosuppression  Shortness of breath.  Also has fatigue with walking which is getting worse over recent months.  Has imbalance which is orthostatic in nature.  No chest pain.  Interval episodes of abrupt onset lightheadedness lasting of 1-2 minutes.  Associated with presyncope.  Occurring now once or twice a week over the last few months.  Peripheral edema  DATE TEST EF   9//21 LHC    % RPDA 95>>stent  6/23 Echo   60-65% %   6/23 CTA  2V CAC  6/23 cMRI 63% Myocarditis acute   Date Cr K Hgb  6/23 1.31 4.4 11.9  8/23 1.75 5.0 10.9  11/23 1.93 5.0  11.2    Sinus bradycardia long standing     Past Medical History:  Diagnosis Date   Abdominal aortic atherosclerosis (HCC)    Anemia    likely of chronic disease   Angina pectoris (HCC) 08/30/2020   Arthritis    Atrial fibrillation (HCC)     Atrial fibrillation with rapid ventricular response (HCC) 05/12/2022   Barrett's esophagus without dysplasia 03/25/2019   Formatting of this note might be different from the original. EGD done in 02/2019. Due in 3 years.   Bipolar 1 disorder (HCC)    Bipolar 1 disorder, mixed, moderate (HCC) 08/16/2014   Bipolar affective disorder, currently depressed, moderate (HCC) 03/08/2020   Bipolar depression (HCC) 08/16/2014   BPH with urinary obstruction 12/29/2018   Brain ischemia 03/08/2020   Cardiac murmur 08/30/2020   Chest pain 09/06/2020   Chicken pox    Chronic bronchitis (HCC)    Chronic kidney disease, stage 3a (HCC) 07/21/2019   Cognitive complaints 03/08/2020   Coronary artery disease involving native coronary artery of native heart 09/06/2020   Coronary artery disease involving native coronary artery of native heart with angina pectoris (HCC) 09/06/2020   Depression    Disc degeneration, 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 Last Assessment & Plan:  Well controlled. Continue current medications.   GERD without esophagitis 08/16/2014   Formatting of this note might be different from the original. Last Assessment & Plan:  Well controlled. Continue current medications.   Herpes gingivostomatitis 02/10/2015   High risk medication use  03/23/2018   History of colon polyps 03/25/2019   Formatting of this note might be different from the original. tubular adenoma   History of stroke 11/10/2019   Hyperlipidemia    Hypertension    Ingrowing nail 03/12/2020   Intention tremor 06/16/2019   Labile hypertension 08/16/2014   Formatting of this note might be different from the original. Last Assessment & Plan:  Slightly above goal today secondary to  pain. Will continue current regimen. Will check CMP today.   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 Concern over possible lichen planus. His dentist noticed lesions of his oral mucosa.  It was felt to be lichen planus.  He presents to discuss further.  Rarely does the area hurt.He smoked in the distant past EXAM shows several white patches on the buccal mucosa bilaterally.  No other concerning oral mucosal les   Long-term use of immunosuppressant medication 03/23/2018   Low testosterone 09/17/2016   Midline thoracic back pain 02/10/2015   Mixed hyperlipidemia 08/16/2014   Myocarditis (HCC) 05/14/2022   Normochromic normocytic anemia 08/16/2014   Normocytic anemia    likely of chronic disease   PAD (peripheral artery disease) (HCC) 03/23/2019   Pain of right hip joint 01/19/2016   Pericarditis 05/12/2022   Rheumatic fever    Rheumatoid arthritis (HCC) 10/29/2018   Formatting of this note might be different from the original. Rheum:  Dr. Sharmon Revere Formerly on MTX - stopped due to mouth sores. Currently started on Arava.  Also on 5 mg prednisone x 3 months during run-in.   Right-sided chest wall pain 04/17/2019   Seasonal allergies 03/23/2018   Sepsis (HCC) 05/11/2022   Stasis dermatitis of both legs 07/29/2022   Statin intolerance 07/31/2020   Task-specific dystonia of hand    right hand   Thoracic radiculopathy 06/16/2019   Vitamin B12 deficiency    Injections   Vitamin D deficiency 09/17/2016      Surgical History:  Past Surgical History:  Procedure Laterality Date   CLAVICLE SURGERY     Right, Hardware placed   CORONARY STENT INTERVENTION N/A 09/06/2020   Procedure: CORONARY STENT INTERVENTION;  Surgeon: Tonny Bollman, MD;  Location[facility] INVASIVE CV LAB;  Service: Cardiovascular;  Laterality: N/A;   ingrown toenail Bilateral    great toes   LEFT HEART CATH AND CORONARY ANGIOGRAPHY  N/A 09/06/2020   Procedure: LEFT HEART CATH AND CORONARY ANGIOGRAPHY;  Surgeon: Tonny Bollman, MD;  Location: [facility] INVASIVE CV LAB;  Service: Cardiovascular;  Laterality: N/A;   WISDOM TOOTH EXTRACTION       Home Meds: Current Meds  Medication Sig   amLODipine (NORVASC) 10 MG tablet TAKE ONE TABLET BY MOUTH DAILY   calcium acetate (PHOSLO) 667 MG capsule Take by mouth 3 (three) times daily with meals.   Cholecalciferol 50 MCG (2000 UT) CAPS Take 2,000 Units by mouth daily.    Coenzyme Q10 (COQ10) 200 MG CAPS Take 200 mg by mouth daily.   diclofenac Sodium (VOLTAREN) 1 % GEL Apply 1 application  topically daily as needed for pain.   ELIQUIS 5 MG TABS tablet TAKE 1 TABLET BY MOUTH TWICE A DAY   ezetimibe (ZETIA) 10 MG tablet Take 1 tablet (10 mg total) by mouth daily.   finasteride (PROSCAR) 5 MG tablet Take 1 tablet (5 mg total) by mouth daily.   hydrALAZINE (APRESOLINE) 50 MG tablet Take 50 mg  by mouth 3 (three) times daily.   influenza vaccine adjuvanted (FLUAD QUADRIVALENT) 0.5 ML injection Inject into the muscle.   lithium carbonate 150 MG capsule TAKE THREE CAPSULES BY MOUTH DAILY   losartan (COZAAR) 100 MG tablet Take 100 mg by mouth daily.   Lysine HCl 1000 MG TABS Take 1,000 mg by mouth 2 (two) times daily.    metoprolol succinate (TOPROL-XL) 25 MG 24 hr tablet Take 1 tablet (25 mg total) by mouth daily.   nitroGLYCERIN (NITROSTAT) 0.4 MG SL tablet Place 1 tablet (0.4 mg total) under the tongue every 5 (five) minutes as needed for chest pain.   pantoprazole (PROTONIX) 40 MG tablet Take 1 tablet (40 mg total) by mouth 2 (two) times daily.   simvastatin (ZOCOR) 5 MG tablet TAKE ONE TABLET BY MOUTH DAILY   Tocilizumab (ACTEMRA ACTPEN) 162 MG/0.9ML SOAJ Inject 162 mg into the skin every 14 (fourteen) days.   vitamin B-12 (CYANOCOBALAMIN) 1000 MCG tablet Take 1,000 mcg by mouth daily.    Allergies:  Allergies  Allergen Reactions   Methotrexate Dermatitis    Developed Mouth Sores    Nsaids Dermatitis    Developed Mouth Blisters   Plaquenil [Hydroxychloroquine] Other (See Comments)    Terrible Nightmares   Azulfidine [Sulfasalazine] Nausea Only   Crestor [Rosuvastatin] Other (See Comments)    Caused abdominal pain that radiated to the back   Isosorbide Other (See Comments)    Made him feel weak   Mevacor [Lovastatin] Other (See Comments)    Caused abdominal pain that radiated to the back   Pravachol [Pravastatin] Other (See Comments)    Caused abdominal pain that radiated to the back    Social History   Socioeconomic History   Marital status: Married    Spouse name: Not on file   Number of children: Not on file   Years of education: Not on file   Highest education level: Not on file  Occupational History   Not on file  Tobacco Use   Smoking status: Former    Packs/day: 2.00    Years: 10.00    Total pack years: 20.00    Types: Cigarettes    Quit date: 39    Years since quitting: 50.0    Passive exposure: Past   Smokeless tobacco: Never   Tobacco comments:    Quit>40 years  Vaping Use   Vaping Use: Never used  Substance and Sexual Activity   Alcohol use: Not Currently    Alcohol/week: 0.0 standard drinks of alcohol   Drug use: Never   Sexual activity: Not on file  Other Topics Concern   Not on file  Social History Narrative   Not on file   Social Determinants of Health   Financial Resource Strain: Not on file  Food Insecurity: Not on file  Transportation Needs: Not on file  Physical Activity: Not on file  Stress: Not on file  Social Connections: Not on file  Intimate Partner Violence: Not on file     Family History  Problem Relation Age of Onset   Alzheimer's disease Mother 33       Deceased in early 53s   Stroke Father 53       Deceased   Hypertension Father    Alcoholism Father    Alcoholism Brother    Heart disease Maternal Grandfather 65       Deceased   Heart disease Paternal Grandfather 35       Deceased    Hypertension  Son      ROS:  Please see the history of present illness.     All other systems reviewed and negative.    Physical Exam:   Blood pressure 132/64, pulse (!) 53, height 5\' 7"  (1.702 m), weight 171 lb 6.4 oz (77.7 kg), SpO2 99 %. General: Well developed, well nourished male in no acute distress. Head: Normocephalic, atraumatic, sclera non-icteric, no xanthomas, nares are without discharge. EENT: normal  Lymph Nodes:  none Neck: Negative for carotid bruits. JVD 6-7 cm back:without scoliosis kyphosis Lungs: Clear bilaterally to auscultation without wheezes, rales, or rhonchi. Breathing is unlabored. Heart: RRR with S1 S2. No  /6 systolic murmur . No rubs, or gallops appreciated. Abdomen: Soft, non-tender, non-distended with normoactive bowel sounds. No hepatomegaly. No rebound/guarding. No obvious abdominal masses. Msk:  Strength and tone appear normal for age. Extremities: No clubbing or cyanosis 1+ edema.  Distal pedal pulses are 2+ and equal bilaterally. Skin: Warm and Dry Neuro: Alert and oriented X 3. CN III-XII intact Grossly normal sensory and motor function . Psych:  Responds to questions appropriately with a normal affect.        EKG: sinus @ 44 18/03/46   Assessment and Plan:    Sinus bradycardia  Orthostatic and not orthostatic lightheadedness  Renal insufficiency  Myopericarditis 6/23  Anemia  Renal insufficiency grade 4   Bipolar  The patient has longstanding sinus bradycardia.  It is not clear that it is contributing to worsening renal insufficiency nor to his episodes of lightheadedness.  I do think it is appropriate to discontinue his metoprolol, the down titration of the metoprolol seems to be associated, see graph above, with a improvement in his heart rates from the 40s to the 50s.  Following his discontinuation we will use an event recorder to try to clarify whether his episodes of lightheadedness and dizziness are rhythm related.  In the  event that they are, pacing would be indicated.  In the event that they are not, I do not think that there is enough information to suggest that pacing at this juncture would improve the concerns outlined previously given the longevity of his bradycardia.  With his worsening exercise intolerance and fatigue, we will repeat his echocardiogram and will defer to his other physicians as to whether repeating his inflammatory markers is necessary.  Anemia is stable. Virl Axe

## 2022-12-11 ENCOUNTER — Ambulatory Visit: Payer: PPO | Attending: Internal Medicine | Admitting: Internal Medicine

## 2022-12-11 ENCOUNTER — Encounter: Payer: Self-pay | Admitting: Internal Medicine

## 2022-12-11 VITALS — BP 154/67 | HR 67 | Ht 67.0 in | Wt 171.4 lb

## 2022-12-11 DIAGNOSIS — I480 Paroxysmal atrial fibrillation: Secondary | ICD-10-CM

## 2022-12-11 NOTE — Patient Instructions (Addendum)
Medication Instructions:  Your physician has recommended you make the following change in your medication:   ** Stop Metoprolol  *If you need a refill on your cardiac medications before your next appointment, please call your pharmacy*   Lab Work: None ordered.    If you have labs (blood work) drawn today and your tests are completely normal, you will receive your results only by: [place] (if you have MyChart) OR A paper copy in the mail If you have any lab test that is abnormal or we need to change your treatment, we will call you to review the results.   Testing/Procedures:  Your physician has requested that you have an echocardiogram. Echocardiography is a painless test that uses sound waves to create images of your heart. It provides your doctor with information about the size and shape of your heart and how well your heart's chambers and valves are working. This procedure takes approximately one hour. There are no restrictions for this procedure. Please do NOT wear cologne, perfume, aftershave, or lotions (deodorant is allowed). Please arrive 15 minutes prior to your appointment time.  ZIO XT- Long Term Monitor Instructions  Your physician has requested you wear a ZIO patch monitor for 14 days.  This is a single patch monitor. [employer] supplies one patch monitor per enrollment. Additional stickers are not available. Please do not apply patch if you will be having a Nuclear Stress Test,  Echocardiogram, Cardiac CT, MRI, or Chest Xray during the period you would be wearing the  monitor. The patch cannot be worn during these tests. You cannot remove and re-apply the  ZIO XT patch monitor.  Your ZIO patch monitor will be mailed 3 day USPS to your address on file. It may take 3-5 days  to receive your monitor after you have been enrolled.  Once you have received your monitor, please review the enclosed instructions. Your monitor  has already been registered assigning a  specific monitor serial # to you.  Billing and Patient Assistance Program Information  We have supplied [employer] with any of your insurance information on file for billing purposes. [employer] offers a sliding scale Patient Assistance Program for patients that do not have  insurance, or whose insurance does not completely cover the cost of the ZIO monitor.  You must apply for the Patient Assistance Program to qualify for this discounted rate.  To apply, please call [employer] at [phone], select option 4, select option 2, ask to apply for  Patient Assistance Program. Theodore Demark will ask your household income, and how many people  are in your household. They will quote your out-of-pocket cost based on that information.  [employer] will also be able to set up a 68-month, interest-free payment plan if needed.  Applying the monitor   Shave hair from upper left chest.  Hold abrader disc by orange tab. Rub abrader in 40 strokes over the upper left chest as  indicated in your monitor instructions.  Clean area with 4 enclosed alcohol pads. Let dry.  Apply patch as indicated in monitor instructions. Patch will be placed under collarbone on left  side of chest with arrow pointing upward.  Rub patch adhesive wings for 2 minutes. Remove white label marked "1". Remove the white  label marked "2". Rub patch adhesive wings for 2 additional minutes.  While looking in a mirror, press and release button in center of patch. A small green light will  flash 3-4 times. This will be your only indicator that the  monitor has been turned on.  Do not shower for the first 24 hours. You may shower after the first 24 hours.  Press the button if you feel a symptom. You will hear a small click. Record Date, Time and  Symptom in the Patient Logbook.  When you are ready to remove the patch, follow instructions on the last 2 pages of Patient  Logbook. Stick patch monitor onto the last page of Patient Logbook.  Place Patient  Logbook in the blue and white box. Use locking tab on box and tape box closed  securely. The blue and white box has prepaid postage on it. Please place it in the mailbox as  soon as possible. Your physician should have your test results approximately 7 days after the  monitor has been mailed back to [facility].  Call [place] at [phone] if you have questions regarding  your ZIO XT patch monitor. Call them immediately if you see an orange light blinking on your  monitor.  If your monitor falls off in less than 4 days, contact our Monitor department at [phone].  If your monitor becomes loose or falls off after 4 days call [employer] at [phone] for  suggestions on securing your monitor     Follow-Up: At [facility], you and your health needs are our priority.  As part of our continuing mission to provide you with exceptional heart care, we have created designated Provider Care Teams.  These Care Teams include your primary Cardiologist (physician) and Advanced Practice Providers (APPs -  Physician Assistants and Nurse Practitioners) who all work together to provide you with the care you need, when you need it.  We recommend signing up for the patient portal called "MyChart".  Sign up information is provided on this After Visit Summary.  MyChart is used to connect with patients for Virtual Visits (Telemedicine).  Patients are able to view lab/test results, encounter notes, upcoming appointments, etc.  Non-urgent messages can be sent to your provider as well.   To learn more about what you can do with MyChart, go to [email or web address].    Your next appointment:   5 week telephone visit with Dr Caryl Comes - 01/16/2023 at [place]      None ordered. None ordered.

## 2022-12-19 ENCOUNTER — Telehealth: Payer: Self-pay | Admitting: Internal Medicine

## 2022-12-19 ENCOUNTER — Ambulatory Visit: Payer: PPO | Attending: Internal Medicine

## 2022-12-19 ENCOUNTER — Other Ambulatory Visit: Payer: Self-pay | Admitting: Psychiatry

## 2022-12-19 DIAGNOSIS — F3132 Bipolar disorder, current episode depressed, moderate: Secondary | ICD-10-CM

## 2022-12-19 DIAGNOSIS — R001 Bradycardia, unspecified: Secondary | ICD-10-CM

## 2022-12-19 NOTE — Progress Notes (Unsigned)
Enrolled for [employer] to mail a ZIO XT long term holter monitor to the patients address on file.  

## 2022-12-19 NOTE — Telephone Encounter (Signed)
  Pt said, Dr. Caryl Comes ordered a heart monitor to wear for 2 weeks. However, pt has not receive it yet. No order on file

## 2022-12-19 NOTE — Telephone Encounter (Signed)
Spoke with pt who states he has stopped his Metoprolol.  Pt advised he will be contacted regarding monitor and instructions.  Pt states he has an echo scheduled for 01/02/2023. Pt verbalizes understanding and agrees with current plan.

## 2022-12-28 ENCOUNTER — Other Ambulatory Visit: Payer: Self-pay | Admitting: Family Medicine

## 2022-12-28 DIAGNOSIS — K219 Gastro-esophageal reflux disease without esophagitis: Secondary | ICD-10-CM

## 2023-01-02 ENCOUNTER — Ambulatory Visit (HOSPITAL_COMMUNITY): Payer: PPO | Attending: Cardiology

## 2023-01-02 ENCOUNTER — Telehealth: Payer: Self-pay | Admitting: Internal Medicine

## 2023-01-02 DIAGNOSIS — I480 Paroxysmal atrial fibrillation: Secondary | ICD-10-CM

## 2023-01-02 DIAGNOSIS — R001 Bradycardia, unspecified: Secondary | ICD-10-CM

## 2023-01-02 LAB — ECHOCARDIOGRAM COMPLETE
Area-P 1/2: 3.5 cm2
MV M vel: 5.72 m/s
MV Peak grad: 130.6 mmHg
Radius: 0.5 cm
S' Lateral: 1.6 cm

## 2023-01-02 NOTE — Telephone Encounter (Signed)
Patient was here today for his echo.   Stated that he just received the 14 day monitor.   He has a upcoming telephone visit on 01/16/23 with Dr. Caryl Comes and want to know if he need to reschedule or keep that appointment.   Dr. Caryl Comes will not have the monitor results.

## 2023-01-07 NOTE — Telephone Encounter (Signed)
Spoke with pt's wife, DPR and advised will have scheduler contact him to reschedule 01/16/2023 appointment since he has just applied monitor.  Pt's wife verbalizes understanding and states she will inform pt.

## 2023-01-12 ENCOUNTER — Other Ambulatory Visit: Payer: Self-pay | Admitting: Family Medicine

## 2023-01-12 DIAGNOSIS — R351 Nocturia: Secondary | ICD-10-CM

## 2023-01-16 ENCOUNTER — Telehealth: Payer: PPO | Admitting: Internal Medicine

## 2023-01-20 DIAGNOSIS — R001 Bradycardia, unspecified: Secondary | ICD-10-CM | POA: Diagnosis not present

## 2023-01-22 ENCOUNTER — Ambulatory Visit (INDEPENDENT_AMBULATORY_CARE_PROVIDER_SITE_OTHER): Payer: PPO | Admitting: *Deleted

## 2023-01-22 VITALS — BP 133/61 | HR 58 | Ht 67.0 in | Wt 171.6 lb

## 2023-01-22 DIAGNOSIS — Z0000 Encounter for general adult medical examination without abnormal findings: Secondary | ICD-10-CM | POA: Diagnosis not present

## 2023-01-22 NOTE — Patient Instructions (Signed)
Noah Grant , Thank you for taking time to come for your Medicare Wellness Visit. I appreciate your ongoing commitment to your health goals. Please review the following plan we discussed and let me know if I can assist you in the future.   These are the goals we discussed:  Goals   None     This is a list of the screening recommended for you and due dates:  Health Maintenance  Topic Date Due   DTaP/Tdap/Td vaccine (1 - Tdap) Never done   Medicare Annual Wellness Visit  01/23/2024   Colon Cancer Screening  12/23/2029   Pneumonia Vaccine  Completed   Flu Shot  Completed   Hepatitis C Screening: USPSTF Recommendation to screen - Ages 18-79 yo.  Completed   Zoster (Shingles) Vaccine  Completed   HPV Vaccine  Aged Out   COVID-19 Vaccine  Discontinued     Next appointment: Follow up in one year for your annual wellness visit.   Preventive Care 68 Years and Older, Male Preventive care refers to lifestyle choices and visits with your health care provider that can promote health and wellness. What does preventive care include? A yearly physical exam. This is also called an annual well check. Dental exams once or twice a year. Routine eye exams. Ask your health care provider how often you should have your eyes checked. Personal lifestyle choices, including: Daily care of your teeth and gums. Regular physical activity. Eating a healthy diet. Avoiding tobacco and drug use. Limiting alcohol use. Practicing safe sex. Taking low doses of aspirin every day. Taking vitamin and mineral supplements as recommended by your health care provider. What happens during an annual well check? The services and screenings done by your health care provider during your annual well check will depend on your age, overall health, lifestyle risk factors, and family history of disease. Counseling  Your health care provider may ask you questions about your: Alcohol use. Tobacco use. Drug use. Emotional  well-being. Home and relationship well-being. Sexual activity. Eating habits. History of falls. Memory and ability to understand (cognition). Work and work Statistician. Screening  You may have the following tests or measurements: Height, weight, and BMI. Blood pressure. Lipid and cholesterol levels. These may be checked every 5 years, or more frequently if you are over 35 years old. Skin check. Lung cancer screening. You may have this screening every year starting at age 36 if you have a 30-pack-year history of smoking and currently smoke or have quit within the past 15 years. Fecal occult blood test (FOBT) of the stool. You may have this test every year starting at age 15. Flexible sigmoidoscopy or colonoscopy. You may have a sigmoidoscopy every 5 years or a colonoscopy every 10 years starting at age 43. Prostate cancer screening. Recommendations will vary depending on your family history and other risks. Hepatitis C blood test. Hepatitis B blood test. Sexually transmitted disease (STD) testing. Diabetes screening. This is done by checking your blood sugar (glucose) after you have not eaten for a while (fasting). You may have this done every 1-3 years. Abdominal aortic aneurysm (AAA) screening. You may need this if you are a current or former smoker. Osteoporosis. You may be screened starting at age 61 if you are at high risk. Talk with your health care provider about your test results, treatment options, and if necessary, the need for more tests. Vaccines  Your health care provider may recommend certain vaccines, such as: Influenza vaccine. This is recommended every  year. Tetanus, diphtheria, and acellular pertussis (Tdap, Td) vaccine. You may need a Td booster every 10 years. Zoster vaccine. You may need this after age 2. Pneumococcal 13-valent conjugate (PCV13) vaccine. One dose is recommended after age 77. Pneumococcal polysaccharide (PPSV23) vaccine. One dose is recommended after  age 43. Talk to your health care provider about which screenings and vaccines you need and how often you need them. This information is not intended to replace advice given to you by your health care provider. Make sure you discuss any questions you have with your health care provider. Document Released: 12/22/2015 Document Revised: 08/14/2016 Document Reviewed: 09/26/2015 Elsevier Interactive Patient Education  2017 [place] Prevention in the Home Falls can cause injuries. They can happen to people of all ages. There are many things you can do to make your home safe and to help prevent falls. What can I do on the outside of my home? Regularly fix the edges of walkways and driveways and fix any cracks. Remove anything that might make you trip as you walk through a door, such as a raised step or threshold. Trim any bushes or trees on the path to your home. Use bright outdoor lighting. Clear any walking paths of anything that might make someone trip, such as rocks or tools. Regularly check to see if handrails are loose or broken. Make sure that both sides of any steps have handrails. Any raised decks and porches should have guardrails on the edges. Have any leaves, snow, or ice cleared regularly. Use sand or salt on walking paths during winter. Clean up any spills in your garage right away. This includes oil or grease spills. What can I do in the bathroom? Use night lights. Install grab bars by the toilet and in the tub and shower. Do not use towel bars as grab bars. Use non-skid mats or decals in the tub or shower. If you need to sit down in the shower, use a plastic, non-slip stool. Keep the floor dry. Clean up any water that spills on the floor as soon as it happens. Remove soap buildup in the tub or shower regularly. Attach bath mats securely with double-sided non-slip rug tape. Do not have throw rugs and other things on the floor that can make you trip. What can I do in the  bedroom? Use night lights. Make sure that you have a light by your bed that is easy to reach. Do not use any sheets or blankets that are too big for your bed. They should not hang down onto the floor. Have a firm chair that has side arms. You can use this for support while you get dressed. Do not have throw rugs and other things on the floor that can make you trip. What can I do in the kitchen? Clean up any spills right away. Avoid walking on wet floors. Keep items that you use a lot in easy-to-reach places. If you need to reach something above you, use a strong step stool that has a grab bar. Keep electrical cords out of the way. Do not use floor polish or wax that makes floors slippery. If you must use wax, use non-skid floor wax. Do not have throw rugs and other things on the floor that can make you trip. What can I do with my stairs? Do not leave any items on the stairs. Make sure that there are handrails on both sides of the stairs and use them. Fix handrails that are broken or  loose. Make sure that handrails are as long as the stairways. Check any carpeting to make sure that it is firmly attached to the stairs. Fix any carpet that is loose or worn. Avoid having throw rugs at the top or bottom of the stairs. If you do have throw rugs, attach them to the floor with carpet tape. Make sure that you have a light switch at the top of the stairs and the bottom of the stairs. If you do not have them, ask someone to add them for you. What else can I do to help prevent falls? Wear shoes that: Do not have high heels. Have rubber bottoms. Are comfortable and fit you well. Are closed at the toe. Do not wear sandals. If you use a stepladder: Make sure that it is fully opened. Do not climb a closed stepladder. Make sure that both sides of the stepladder are locked into place. Ask someone to hold it for you, if possible. Clearly mark and make sure that you can see: Any grab bars or  handrails. First and last steps. Where the edge of each step is. Use tools that help you move around (mobility aids) if they are needed. These include: Canes. Walkers. Scooters. Crutches. Turn on the lights when you go into a dark area. Replace any light bulbs as soon as they burn out. Set up your furniture so you have a clear path. Avoid moving your furniture around. If any of your floors are uneven, fix them. If there are any pets around you, be aware of where they are. Review your medicines with your doctor. Some medicines can make you feel dizzy. This can increase your chance of falling. Ask your doctor what other things that you can do to help prevent falls. This information is not intended to replace advice given to you by your health care provider. Make sure you discuss any questions you have with your health care provider. Document Released: 09/21/2009 Document Revised: 05/02/2016 Document Reviewed: 12/30/2014 Elsevier Interactive Patient Education  2017 [employer].

## 2023-01-22 NOTE — Progress Notes (Signed)
Subjective:   Noah Grant is a 72 y.o. male who presents for Medicare Annual/Subsequent preventive examination.  Review of Systems    Defer to PCP Cardiac Risk Factors include: advanced age (>42mn, >>24women);dyslipidemia;hypertension;male gender     Objective:    Today's Vitals   01/22/23 1021 01/22/23 1043  BP: (!) 147/62 133/61  Pulse: 65 (!) 58  Weight: 171 lb 9.6 oz (77.8 kg)   Height: 5' 7"$  (1.702 m)    Body mass index is 26.88 kg/m.     01/22/2023   10:25 AM 05/12/2022    6:29 PM 05/11/2022    5:16 PM 12/12/2021    8:58 AM 12/14/2020    9:38 AM 09/06/2020    9:46 AM  Advanced Directives  Does Patient Have a Medical Advance Directive? Yes Yes Yes Yes No No  Type of AParamed[place]of [place] will Living will;Healthcare Power of Attorney Living will;Healthcare Power of [place] will    Does patient want to make changes to medical advance directive? No - Patient declined No - Patient declined      Copy of [place] Chart? Yes - validated most recent copy scanned in chart (See row information) No - copy requested      Would patient like information on creating a medical advance directive?     No - Patient declined No - Patient declined    Current Medications (verified) Outpatient Encounter Medications as of 01/22/2023  Medication Sig   calcium acetate (PHOSLO) 667 MG capsule Take by mouth 3 (three) times daily with meals.   Cholecalciferol 50 MCG (2000 UT) CAPS Take 2,000 Units by mouth daily.    Coenzyme Q10 (COQ10) 200 MG CAPS Take 200 mg by mouth daily.   diclofenac Sodium (VOLTAREN) 1 % GEL Apply 1 application  topically daily as needed for pain.   ELIQUIS 5 MG TABS tablet TAKE 1 TABLET BY MOUTH TWICE A DAY   ezetimibe (ZETIA) 10 MG tablet Take 1 tablet (10 mg total) by mouth daily.   finasteride (PROSCAR) 5 MG tablet TAKE 1 TABLET BY MOUTH DAILY   hydrALAZINE (APRESOLINE) 50 MG  tablet Take 50 mg by mouth 3 (three) times daily.   influenza vaccine adjuvanted (FLUAD QUADRIVALENT) 0.5 ML injection Inject into the muscle.   lithium carbonate 150 MG capsule TAKE THREE CAPSULES BY MOUTH DAILY   losartan (COZAAR) 100 MG tablet Take 100 mg by mouth daily.   Lysine HCl 1000 MG TABS Take 1,000 mg by mouth 2 (two) times daily.    nitroGLYCERIN (NITROSTAT) 0.4 MG SL tablet Place 1 tablet (0.4 mg total) under the tongue every 5 (five) minutes as needed for chest pain.   pantoprazole (PROTONIX) 40 MG tablet TAKE 1 TABLET BY MOUTH TWICE A DAY   simvastatin (ZOCOR) 5 MG tablet TAKE ONE TABLET BY MOUTH DAILY   Tocilizumab (ACTEMRA ACTPEN) 162 MG/0.9ML SOAJ Inject 162 mg into the skin every 14 (fourteen) days.   vitamin B-12 (CYANOCOBALAMIN) 1000 MCG tablet Take 1,000 mcg by mouth daily.   [DISCONTINUED] amLODipine (NORVASC) 10 MG tablet TAKE ONE TABLET BY MOUTH DAILY   No facility-administered encounter medications on file as of 01/22/2023.    Allergies (verified) Methotrexate, Nsaids, Plaquenil [hydroxychloroquine], Azulfidine [sulfasalazine], Crestor [rosuvastatin], Isosorbide, Mevacor [lovastatin], and Pravachol [pravastatin]   History: Past Medical History:  Diagnosis Date   Abdominal aortic atherosclerosis ([place]    Anemia    likely of chronic disease  Angina pectoris ([place]) 08/30/2020   Arthritis    Atrial fibrillation (HCC)    Atrial fibrillation with rapid ventricular response ([place]) 05/12/2022   Barrett's esophagus without dysplasia 03/25/2019   Formatting of this note might be different from the original. EGD done in 02/2019. Due in 3 years.   Bipolar 1 disorder ([place])    Bipolar 1 disorder, mixed, moderate ([place]) 08/16/2014   Bipolar affective disorder, currently depressed, moderate ([place]) 03/08/2020   Bipolar depression ([place]) 08/16/2014   BPH with urinary obstruction 12/29/2018   Brain ischemia 03/08/2020   Cardiac murmur 08/30/2020   Chest pain 09/06/2020   Chicken  pox    Chronic bronchitis (HCC)    Chronic kidney disease, stage 3a ([place]) 07/21/2019   Cognitive complaints 03/08/2020   Coronary artery disease involving native coronary artery of native heart 09/06/2020   Coronary artery disease involving native coronary artery of native heart with angina pectoris ([place]) 09/06/2020   Depression    Disc degeneration, 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 Last Assessment & Plan:  Well controlled. Continue current medications.   GERD without esophagitis 08/16/2014   Formatting of this note might be different from the original. Last Assessment & Plan:  Well controlled. Continue current medications.   Herpes gingivostomatitis 02/10/2015   High risk medication use 03/23/2018   History of colon polyps 03/25/2019   Formatting of this note might be different from the original. tubular adenoma   History of stroke 11/10/2019   Hyperlipidemia    Hypertension    Ingrowing nail 03/12/2020   Intention tremor 06/16/2019   Labile hypertension 08/16/2014   Formatting of this note might be different from the original. Last Assessment & Plan:  Slightly above goal today secondary to pain. Will continue current regimen. Will check CMP today.   Lactic acidosis [id number]   Lichen planus [id number]   Last Assessment & Plan:  Formatting of this note might be different from the original. Concern over possible lichen planus. His dentist noticed lesions of his oral mucosa.  It was felt to be lichen planus.  He presents to discuss further.  Rarely does the area hurt.He smoked in the distant past EXAM shows several white patches on the buccal mucosa bilaterally.  No other concerning oral mucosal les   Long-term use of immunosuppressant  medication 03/23/2018   Low testosterone 09/17/2016   Midline thoracic back pain 02/10/2015   Mixed hyperlipidemia 08/16/2014   Myocarditis ([place]) 05/14/2022   Normochromic normocytic anemia 08/16/2014   Normocytic anemia    likely of chronic disease   PAD (peripheral artery disease) ([place]) 03/23/2019   Pain of right hip joint 01/19/2016   Pericarditis 05/12/2022   Rheumatic fever    Rheumatoid arthritis ([place]) 10/29/2018   Formatting of this note might be different from the original. Rheum:  Dr. Gerilyn Nestle Formerly on MTX - stopped due to mouth sores. Currently started on Arava.  Also on 5 mg prednisone x 3 months during run-in.   Right-sided chest wall pain 04/17/2019   Seasonal allergies 03/23/2018   Sepsis ([place]) 05/11/2022   Stasis dermatitis of both legs 07/29/2022   Statin intolerance 07/31/2020   Task-specific dystonia of hand    right hand   Thoracic radiculopathy 06/16/2019   Vitamin B12 deficiency  Injections   Vitamin D deficiency 09/17/2016   Past Surgical History:  Procedure Laterality Date   CLAVICLE SURGERY     Right, Hardware placed   CORONARY STENT INTERVENTION N/A 09/06/2020   Procedure: CORONARY STENT INTERVENTION;  Surgeon: Sherren Mocha, MD;  Location: [facility];  Service: Cardiovascular;  Laterality: N/A;   ingrown toenail Bilateral    great toes   LEFT HEART CATH AND CORONARY ANGIOGRAPHY N/A 09/06/2020   Procedure: LEFT HEART CATH AND CORONARY ANGIOGRAPHY;  Surgeon: Sherren Mocha, MD;  Location: [facility];  Service: Cardiovascular;  Laterality: N/A;   WISDOM TOOTH EXTRACTION     Family History  Problem Relation Age of Onset   Alzheimer's disease Mother 41       Deceased in early 43s   Stroke Father 61       Deceased   Hypertension Father    Alcoholism Father    Alcoholism Brother    Heart disease Maternal Grandfather 62       Deceased   Heart disease Paternal Grandfather 68       Deceased   Hypertension Son    Social History    Socioeconomic History   Marital status: Married    Spouse name: Not on file   Number of children: Not on file   Years of education: Not on file   Highest education level: Not on file  Occupational History   Not on file  Tobacco Use   Smoking status: Former    Packs/day: 2.00    Years: 10.00    Total pack years: 20.00    Types: Cigarettes    Quit date: 29    Years since quitting: 50.1    Passive exposure: Past   Smokeless tobacco: Never   Tobacco comments:    Quit>40 years  Vaping Use   Vaping Use: Never used  Substance and Sexual Activity   Alcohol use: Not Currently    Alcohol/week: 0.0 standard drinks of alcohol   Drug use: Never   Sexual activity: Not on file  Other Topics Concern   Not on file  Social History Narrative   Not on file   Social Determinants of Health   Financial Resource Strain: Low Risk  (01/22/2023)   Overall Financial Resource Strain (CARDIA)    Difficulty of Paying Living Expenses: Not hard at all  Food Insecurity: No Food Insecurity (01/22/2023)   Hunger Vital Sign    Worried About Running Out of Food in the Last Year: Never true    Ran Out of Food in the Last Year: Never true  Transportation Needs: No Transportation Needs (01/22/2023)   PRAPARE - Hydrologist (Medical): No    Lack of Transportation (Non-Medical): No  Physical Activity: Inactive (01/22/2023)   Exercise Vital Sign    Days of Exercise per Week: 0 days    Minutes of Exercise per Session: 0 min  Stress: No Stress Concern Present (01/22/2023)   [place]    Feeling of Stress : Not at all  Social Connections: Socially Isolated (01/22/2023)   Social Connection and Isolation Panel [NHANES]    Frequency of Communication with Friends and Family: Once a week    Frequency of Social Gatherings with Friends and Family: Once a week    Attends Religious Services: Never    Corporate treasurer or Organizations: No    Attends Archivist Meetings: Never  Marital Status: Married    Tobacco Counseling Counseling given: Not Answered Tobacco comments: Quit>40 years   Clinical Intake:  Pre-visit preparation completed: Yes  Pain : No/denies pain  Diabetes: No  How often do you need to have someone help you when you read instructions, pamphlets, or other written materials from your doctor or pharmacy?: 1 - Never   Activities of Daily Living    01/22/2023   10:31 AM 05/12/2022    6:32 PM  In your present state of health, do you have any difficulty performing the following activities:  Hearing? 1 0  Comment hearing loss, no hearing aids   Vision? 0 0  Difficulty concentrating or making decisions? 1 0  Comment "i have trouble with word recall"   Walking or climbing stairs? 1 1  Dressing or bathing? 0 0  Doing errands, shopping? 0   Preparing Food and eating ? N   Using the Toilet? N   In the past six months, have you accidently leaked urine? Y   Do you have problems with loss of bowel control? N   Managing your Medications? N   Managing your Finances? N   Housekeeping or managing your Housekeeping? N     Patient Care Team: [name], DO as PCP - General (Family Medicine) Revankar, Reita Cliche, MD as PCP - Cardiology (Cardiology)  Indicate any recent Medical Services you may have received from other than [facility] providers in the past year (date may be approximate).     Assessment:   This is a routine wellness examination for Jerrod.  Hearing/Vision screen No results found.  Dietary issues and exercise activities discussed: Current Exercise Habits: Home exercise routine, Type of exercise: walking (walks dog), Time (Minutes): 15, Frequency (Times/Week): 7 (weather permitting), Weekly Exercise (Minutes/Week): 105, Intensity: Mild, Exercise limited by: cardiac condition(s);orthopedic condition(s)   Goals Addressed   None    Depression  Screen    01/22/2023   10:31 AM  PHQ 2/9 Scores  PHQ - 2 Score 0    Fall Risk    01/22/2023   10:25 AM  [place] in the past year? 0  Number falls in past yr: 0  Injury with Fall? 0  Risk for fall due to : Impaired balance/gait  Follow up Falls evaluation completed    FALL RISK PREVENTION PERTAINING TO THE HOME:  Any stairs in or around the home? No  Home free of loose throw rugs in walkways, pet beds, electrical cords, etc? Yes  Adequate lighting in your home to reduce risk of falls? Yes   ASSISTIVE DEVICES UTILIZED TO PREVENT FALLS:  Life alert? No  Use of a cane, walker or w/c? Yes  Grab bars in the bathroom? No  Shower chair or bench in shower? No  Elevated toilet seat or a handicapped toilet?  Comfort height  TIMED UP AND GO:  Was the test performed? Yes .  Length of time to ambulate 10 feet: 8 sec.   Gait slow and steady with assistive device  Cognitive Function:        01/22/2023   10:41 AM  6CIT Screen  What Year? 0 points  What month? 0 points  What time? 0 points  Count back from 20 2 points  Months in reverse 4 points  Repeat phrase 0 points  Total Score 6 points    Immunizations Immunization History  Administered Date(s) Administered   Fluad Quad(high Dose 65+) 09/03/2021, 09/04/2022   Influenza, High  Dose Seasonal PF 09/10/2019, 09/03/2020   Influenza,inj,Quad PF,6+ Mos 08/22/2014, 10/04/2015   Moderna SARS-COV2 Booster Vaccination 10/31/2020, 07/04/2021   Moderna Sars-Covid-2 Vaccination 01/17/2020, 02/13/2020   PNEUMOCOCCAL CONJUGATE-20 01/02/2022   Zoster Recombinat (Shingrix) 07/02/2017, 09/02/2017   Zoster, Live 08/17/2011    TDAP status: Due, Education has been provided regarding the importance of this vaccine. Advised may receive this vaccine at local pharmacy or Health Dept. Aware to provide a copy of the vaccination record if obtained from local pharmacy or Health Dept. Verbalized acceptance and understanding.  Flu  Vaccine status: Up to date  Pneumococcal vaccine status: Up to date  Covid-19 vaccine status: Declined, Education has been provided regarding the importance of this vaccine but patient still declined. Advised may receive this vaccine at local pharmacy or Health Dept.or vaccine clinic. Aware to provide a copy of the vaccination record if obtained from local pharmacy or Health Dept. Verbalized acceptance and understanding.  Qualifies for Shingles Vaccine? Yes   Zostavax completed Yes   Shingrix Completed?: Yes  Screening Tests Health Maintenance  Topic Date Due   Medicare Annual Wellness (AWV)  Never done   DTaP/Tdap/Td (1 - Tdap) Never done   COLONOSCOPY (Pts 45-63yr Insurance coverage will need to be confirmed)  12/23/2029   Pneumonia Vaccine 72 Years old  Completed   INFLUENZA VACCINE  Completed   Hepatitis C Screening  Completed   Zoster Vaccines- Shingrix  Completed   HPV VACCINES  Aged Out   COVID-19 Vaccine  Discontinued    Health Maintenance  Health Maintenance Due  Topic Date Due   Medicare Annual Wellness (AWV)  Never done   DTaP/Tdap/Td (1 - Tdap) Never done    Colorectal cancer screening: Type of screening: Colonoscopy. Completed 12/24/19. Repeat every 10 years  Lung Cancer Screening: (Low Dose CT Chest recommended if Age 72-80years, 30 pack-year currently smoking OR have quit w/in 15years.) does not qualify.   Additional Screening:  Hepatitis C Screening: does qualify; Completed 05/31/05  Vision Screening: Recommended annual ophthalmology exams for early detection of glaucoma and other disorders of the eye. Is the patient up to date with their annual eye exam?  Yes  Who is the provider or what is the name of the office in which the patient attends annual eye exams? [facility] If pt is not established with a provider, would they like to be referred to a provider to establish care? No .   Dental Screening: Recommended annual dental exams for proper oral  hygiene  Community Resource Referral / Chronic Care Management: CRR required this visit?  No   CCM required this visit?  No      Plan:     I have personally reviewed and noted the following in the patient's chart:   Medical and social history Use of alcohol, tobacco or illicit drugs  Current medications and supplements including opioid prescriptions. Patient is not currently taking opioid prescriptions. Functional ability and status Nutritional status Physical activity Advanced directives List of other physicians Hospitalizations, surgeries, and ER visits in previous 12 months Vitals Screenings to include cognitive, depression, and falls Referrals and appointments  In addition, I have reviewed and discussed with patient certain preventive protocols, quality metrics, and best practice recommendations. A written personalized care plan for preventive services as well as general preventive health recommendations were provided to patient.     BBeatris Ship [place]  01/22/2023   Nurse Notes: None

## 2023-01-23 ENCOUNTER — Ambulatory Visit: Payer: PPO | Admitting: Psychiatry

## 2023-01-28 NOTE — Progress Notes (Unsigned)
Office Visit Note  Patient: Noah Grant             Date of Birth: [birth year]           MRN: [id number]             PCP: [name], DO Referring: [name]* Visit Date: 01/29/2023   Subjective:  No chief complaint on file.   History of Present Illness: LATIF BOSSIE is a 72 y.o. male here for follow up ***   Previous HPI 10/29/22 JOSEROBERTO BUNGAY is a 72 y.o. 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  He completed the colchicine and is now on just the Actemra treatment.  He is feeling very fatigued limiting his overall activity level.  He is also been experiencing some additional pain and stiffness in multiple areas.  He had increased swelling in both legs accumulating towards the feet and ankles.  He has had adjustment to beta blocker treatment currently prescribed metoprolol XR 25 mg daily. Bradycardia and heart rate improved now often in the 50s did not notice any difference in the peripheral swelling after the medicine change. He is also still on norvasc and hydralazine for blood pressure control.     Previous HPI 05/30/22 TRENT BLONSKI is a 72 y.o. male here for follow up for RA he was on leflunomide recently hospitalized with sepsis and pericarditis without clear underlying infection identified.  Symptoms started with chills developed increasing swelling in his hands and feet with joint pain that was worse in the shoulders and hips.  He developed severe chest pain felt a constriction or tightness around the heart.  The chest pain was sharp did report provocation with deep breaths or lying in left or right positions.  Evaluation was consistent with pericarditis with effusion.  He was discharged on colchicine and received short term steroids and the chest pain is currently doing well.  The flareup of joint pain has also improved with the oral anti-inflammatory medication.   Previous  HPI 04/19/22 DINA MURGA is a 72 y.o. male here for rheumatoid arthritis previously seeing Dr. Gerilyn Nestle and on treatment with leflunomide.  Symptoms started a long time ago gradual onset but has been seeing trucks for treatment of this in the past decade or so.  He has joint pain and stiffness involving multiple areas.  Shoulders, wrists, hands, also bilateral hip pains.  Most commonly sees visible swelling or inflammation in his hands.  He has morning stiffness very severe for 30 minutes to an hour but keeps persistent joint stiffness throughout the day.  Also has pain increased when trying to lie still at night.  He has tried a number of treatments for this over multiple years.  He did not tolerate hydroxychloroquine due to confusion, MTX not tolerated, leflunomide leukopenia, and sulfasalazine mood disturbances.  He never experienced a significant improvement with Humira injection and developed acute conjunctivitis shortly after starting Remicade infusion.   DMARD Hx LEF current MTX GI intolerance HCQ Psychiatric side effects SSZ Psychiatric side effects Humira nonresponder Remicade nonresponder and conjunctivitis   12/2021 Cholesterol 107 TGs 193 HDL 28.9   09/2021 HBV neg HCV neg   No Rheumatology ROS completed.   PMFS History:  Patient Active Problem List   Diagnosis Date Noted   Anemia 09/02/2022   Atrial fibrillation ([place]) 09/02/2022   Stasis dermatitis of both legs 07/29/2022   Myocarditis ([place]) 05/14/2022  Pericarditis 05/12/2022   Atrial fibrillation with rapid ventricular response ([place]) 05/12/2022   Sepsis ([place]) 05/11/2022   Elevated troponin level not due myocardial infarction 05/11/2022   Lactic acidosis 05/11/2022   Drug therapy 05/18/2021   Hypertension    Normocytic anemia    Arthritis    Bipolar 1 disorder (HCC)    Chicken pox    Chronic bronchitis (HCC)    Depression    Hyperlipidemia    Rheumatic fever    Essential hypertension    Coronary  artery disease involving native coronary artery of native heart 09/06/2020   Chest pain 09/06/2020   Coronary artery disease involving native coronary artery of native heart with angina pectoris ([place]) 09/06/2020   Angina pectoris ([place]) 08/30/2020   Cardiac murmur 08/30/2020   Statin intolerance 07/31/2020   Abdominal aortic atherosclerosis ([place])    Ingrowing nail 03/12/2020   Bipolar affective disorder, currently depressed, moderate ([place]) 03/08/2020   Brain ischemia 03/08/2020   Cognitive complaints 03/08/2020   History of stroke 11/10/2019   Chronic kidney disease, stage 3a ([place]) 07/21/2019   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   Thoracic radiculopathy 06/16/2019   Intention tremor 06/16/2019   Right-sided chest wall pain 04/17/2019   Barrett's esophagus without dysplasia 03/25/2019   History of colon polyps 03/25/2019   PAD (peripheral artery disease) ([place]) 03/23/2019   Benign prostatic hyperplasia with nocturia 12/29/2018   GAD (generalized anxiety disorder) 11/02/2018   Rheumatoid arthritis ([place]) [id number]   Lichen planus [id number]   High risk medication use 03/23/2018   Seasonal allergies 03/23/2018   Long-term use of immunosuppressant medication 03/23/2018   Low testosterone 09/17/2016   Vitamin B12 deficiency 09/17/2016   Vitamin D deficiency 09/17/2016   Disc degeneration, 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   Pain of right hip joint 01/19/2016   Herpes gingivostomatitis 02/10/2015   Midline thoracic back pain 02/10/2015   Normochromic normocytic anemia 08/16/2014   Bipolar 1 disorder, mixed, moderate ([place]) 08/16/2014   Mixed hyperlipidemia 08/16/2014   Labile hypertension 08/16/2014   Gastroesophageal reflux disease 08/16/2014   Bipolar depression ([place]) 08/16/2014   GERD without esophagitis 08/16/2014    Past Medical History:  Diagnosis Date   Abdominal aortic atherosclerosis ([place])    Anemia    likely of chronic disease   Angina pectoris ([place]) 08/30/2020   Arthritis     Atrial fibrillation ([place])    Atrial fibrillation with rapid ventricular response ([place]) 05/12/2022   Barrett's esophagus without dysplasia 03/25/2019   Formatting of this note might be different from the original. EGD done in 02/2019. Due in 3 years.   Bipolar 1 disorder ([place])    Bipolar 1 disorder, mixed, moderate ([place]) 08/16/2014   Bipolar affective disorder, currently depressed, moderate ([place]) 03/08/2020   Bipolar depression ([place]) 08/16/2014   BPH with urinary obstruction 12/29/2018   Brain ischemia 03/08/2020   Cardiac murmur 08/30/2020   Chest pain 09/06/2020   Chicken pox    Chronic bronchitis (HCC)    Chronic kidney disease, stage 3a ([place]) 07/21/2019   Cognitive complaints 03/08/2020   Coronary artery disease involving native coronary artery of native heart 09/06/2020   Coronary artery disease involving native coronary artery of native heart with angina pectoris ([place]) 09/06/2020   Depression    Disc degeneration, 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   Drug therapy 05/18/2021   Elevated troponin level not due myocardial infarction 05/11/2022   Essential hypertension    GAD (generalized anxiety disorder) 11/02/2018   Gastroesophageal reflux disease 08/16/2014   Formatting of this  note might be different from the original. Last Assessment & Plan:  Well controlled. Continue current medications.   GERD without esophagitis 08/16/2014   Formatting of this note might be different from the original. Last Assessment & Plan:  Well controlled. Continue current medications.   Herpes gingivostomatitis 02/10/2015   High risk medication use 03/23/2018   History of colon polyps 03/25/2019   Formatting of this note might be different from the original. tubular adenoma   History of stroke 11/10/2019   Hyperlipidemia    Hypertension    Ingrowing nail 03/12/2020   Intention tremor 06/16/2019   Labile hypertension 08/16/2014   Formatting of this note might be different from the original. Last Assessment & Plan:  Slightly  above goal today secondary to pain. Will continue current regimen. Will check CMP today.   Lactic acidosis [id number]   Lichen planus [id number]   Last Assessment & Plan:  Formatting of this note might be different from the original. Concern over possible lichen planus. His dentist noticed lesions of his oral mucosa.  It was felt to be lichen planus.  He presents to discuss further.  Rarely does the area hurt.He smoked in the distant past EXAM shows several white patches on the buccal mucosa bilaterally.  No other concerning oral mucosal les   Long-term use of immunosuppressant medication 03/23/2018   Low testosterone 09/17/2016   Midline thoracic back pain 02/10/2015   Mixed hyperlipidemia 08/16/2014   Myocarditis ([place]) 05/14/2022   Normochromic normocytic anemia 08/16/2014   Normocytic anemia    likely of chronic disease   PAD (peripheral artery disease) ([place]) 03/23/2019   Pain of right hip joint 01/19/2016   Pericarditis 05/12/2022   Rheumatic fever    Rheumatoid arthritis ([place]) 10/29/2018   Formatting of this note might be different from the original. Rheum:  Dr. Gerilyn Nestle Formerly on MTX - stopped due to mouth sores. Currently started on Arava.  Also on 5 mg prednisone x 3 months during run-in.   Right-sided chest wall pain 04/17/2019   Seasonal allergies 03/23/2018   Sepsis ([place]) 05/11/2022   Stasis dermatitis of both legs 07/29/2022   Statin intolerance 07/31/2020   Task-specific dystonia of hand    right hand   Thoracic radiculopathy 06/16/2019   Vitamin B12 deficiency    Injections   Vitamin D deficiency 09/17/2016    Family History  Problem Relation Age of Onset   Alzheimer's disease Mother 52       Deceased in early 46s   Stroke Father 71       Deceased   Hypertension Father    Alcoholism Father    Alcoholism Brother    Heart disease Maternal Grandfather 24       Deceased   Heart disease Paternal Grandfather 64       Deceased   Hypertension Son    Past Surgical  History:  Procedure Laterality Date   CLAVICLE SURGERY     Right, Hardware placed   CORONARY STENT INTERVENTION N/A 09/06/2020   Procedure: CORONARY STENT INTERVENTION;  Surgeon: Sherren Mocha, MD;  Location: [facility];  Service: Cardiovascular;  Laterality: N/A;   ingrown toenail Bilateral    great toes   LEFT HEART CATH AND CORONARY ANGIOGRAPHY N/A 09/06/2020   Procedure: LEFT HEART CATH AND CORONARY ANGIOGRAPHY;  Surgeon: Sherren Mocha, MD;  Location: [facility];  Service: Cardiovascular;  Laterality: N/A;   [place] EXTRACTION     Social History   Social History  Narrative   Not on file   Immunization History  Administered Date(s) Administered   Fluad Quad(high Dose 65+) 09/03/2021, 09/04/2022   Influenza, High Dose Seasonal PF 09/10/2019, 09/03/2020   Influenza,inj,Quad PF,6+ Mos 08/22/2014, 10/04/2015   Moderna SARS-COV2 Booster Vaccination 10/31/2020, 07/04/2021   Moderna Sars-Covid-2 Vaccination 01/17/2020, 02/13/2020   PNEUMOCOCCAL CONJUGATE-20 01/02/2022   Zoster Recombinat (Shingrix) 07/02/2017, 09/02/2017   Zoster, Live 08/17/2011     Objective: Vital Signs: There were no vitals taken for this visit.   Physical Exam   Musculoskeletal Exam: ***  CDAI Exam: CDAI Score: -- Patient Global: --; Provider Global: -- Swollen: --; Tender: -- Joint Exam 01/29/2023   No joint exam has been documented for this visit   There is currently no information documented on the homunculus. Go to the Rheumatology activity and complete the homunculus joint exam.  Investigation: No additional findings.  Imaging: ECHOCARDIOGRAM COMPLETE  Result Date: 01/02/2023    ECHOCARDIOGRAM REPORT   Patient Name:   YSRAEL BOGHOSIAN Date of Exam: 01/02/2023 Medical Rec #:  [id number]       Height:       67.0 in Accession #:    [id number]      Weight:       171.4 lb Date of Birth:  04-26-1951      BSA:          1.894 m Patient Age:    21 years        BP:            154/67 mmHg Patient Gender: M               HR:           57 bpm. Exam Location:  [place] [facility] Procedure: 2D Echo, Cardiac Doppler and Color Doppler Indications:    I48.91* Unspecified atrial fibrillation  History:        Patient has prior history of Echocardiogram examinations, most                 recent 05/12/2022. CAD, Stroke and PAD, Signs/Symptoms:Fatigue,                 Shortness of Breath, Chest Pain and Murmur; Risk                 Factors:Hypertension, Dyslipidemia and Former Smoker.                 Bradycardia. Peripheral edema. Pericarditis. Myocarditis.                 Rheumatic fever. Rheumatoid arthritis.  Sonographer:    Diamond Nickel RCS Referring Phys: [place]  Sonographer Comments: Technically difficult to position patient due to rheumatoid arthritis discomfort. IMPRESSIONS  1. Left ventricular ejection fraction, by estimation, is 60 to 65%. The left ventricle has normal function. The left ventricle has no regional wall motion abnormalities. There is moderate concentric left ventricular hypertrophy. Left ventricular diastolic parameters were normal.  2. Right ventricular systolic function is normal. The right ventricular size is normal. Tricuspid regurgitation signal is inadequate for assessing PA pressure.  3. The mitral valve is normal in structure. Mild mitral valve regurgitation. No evidence of mitral stenosis.  4. The aortic valve is tricuspid. Aortic valve regurgitation is not visualized. Aortic valve sclerosis/calcification is present, without any evidence of aortic stenosis. FINDINGS  Left Ventricle: Left ventricular ejection fraction, by estimation, is 60 to 65%. The left ventricle has normal function. The left ventricle  has no regional wall motion abnormalities. The left ventricular internal cavity size was normal in size. There is  moderate concentric left ventricular hypertrophy. Left ventricular diastolic parameters were normal. Normal left ventricular filling pressure.  Right Ventricle: The right ventricular size is normal. No increase in right ventricular wall thickness. Right ventricular systolic function is normal. Tricuspid regurgitation signal is inadequate for assessing PA pressure. Left Atrium: Left atrial size was normal in size. Right Atrium: Right atrial size was normal in size. Pericardium: There is no evidence of pericardial effusion. Mitral Valve: The mitral valve is normal in structure. Mild mitral valve regurgitation. No evidence of mitral valve stenosis. Tricuspid Valve: The tricuspid valve is normal in structure. Tricuspid valve regurgitation is trivial. No evidence of tricuspid stenosis. Aortic Valve: The aortic valve is tricuspid. Aortic valve regurgitation is not visualized. Aortic valve sclerosis/calcification is present, without any evidence of aortic stenosis. Pulmonic Valve: The pulmonic valve was normal in structure. Pulmonic valve regurgitation is not visualized. No evidence of pulmonic stenosis. Aorta: The aortic root is normal in size and structure. Venous: The inferior vena cava was not well visualized. IAS/Shunts: No atrial level shunt detected by color flow Doppler.  LEFT VENTRICLE PLAX 2D LVIDd:         2.60 cm   Diastology LVIDs:         1.60 cm   LV e' medial:    8.70 cm/s LV PW:         1.40 cm   LV E/e' medial:  11.1 LV IVS:        1.40 cm   LV e' lateral:   13.20 cm/s LVOT diam:     2.20 cm   LV E/e' lateral: 7.3 LV SV:         138 LV SV Index:   73 LVOT Area:     3.80 cm  RIGHT VENTRICLE RV Basal diam:  3.40 cm RV S prime:     13.40 cm/s TAPSE (M-mode): 2.5 cm LEFT ATRIUM             Index        RIGHT ATRIUM           Index LA diam:        4.00 cm 2.11 cm/m   RA Area:     13.80 cm LA Vol (A2C):   51.6 ml 27.25 ml/m  RA Volume:   27.20 ml  14.36 ml/m LA Vol (A4C):   54.3 ml 28.67 ml/m LA Biplane Vol: 53.1 ml 28.04 ml/m  AORTIC VALVE LVOT Vmax:   147.00 cm/s LVOT Vmean:  99.200 cm/s LVOT VTI:    0.362 m  AORTA Ao Root diam: 3.00 cm Ao Asc  diam:  3.60 cm MITRAL VALVE MV Area (PHT): 3.50 cm       SHUNTS MV Decel Time: 217 msec       Systemic VTI:  0.36 m MR Peak grad:    130.6 mmHg   Systemic Diam: 2.20 cm MR Mean grad:    71.0 mmHg MR Vmax:         571.50 cm/s MR Vmean:        380.5 cm/s MR PISA:         1.57 cm MR PISA Eff ROA: 11 mm MR PISA Radius:  0.50 cm MV E velocity: 96.90 cm/s MV A velocity: 77.60 cm/s MV E/A ratio:  1.25 Fransico Him MD Electronically signed by Fransico Him MD Signature Date/Time: 01/02/2023/3:34:19 PM  Final     Recent Labs: Lab Results  Component Value Date   WBC 4.7 10/29/2022   HGB 11.2 (L) 10/29/2022   PLT 210 10/29/2022   NA 139 07/29/2022   K 5.2 07/29/2022   CL 107 07/29/2022   CO2 25 07/29/2022   GLUCOSE 93 07/29/2022   BUN 34 (H) 07/29/2022   CREATININE 1.75 (H) 07/29/2022   BILITOT 0.3 07/29/2022   ALKPHOS 44 05/12/2022   AST 20 07/29/2022   ALT 39 07/29/2022   PROT 6.2 07/29/2022   ALBUMIN 2.8 (L) 05/12/2022   CALCIUM 9.5 07/29/2022   GFRAA 75 11/28/2020   QFTBGOLDPLUS NEGATIVE 04/19/2022    Speciality Comments: No specialty comments available.  Procedures:  No procedures performed Allergies: Methotrexate, Nsaids, Plaquenil [hydroxychloroquine], Azulfidine [sulfasalazine], Crestor [rosuvastatin], Isosorbide, Mevacor [lovastatin], and Pravachol [pravastatin]   Assessment / Plan:     Visit Diagnoses: No diagnosis found.  ***  Orders: No orders of the defined types were placed in this encounter.  No orders of the defined types were placed in this encounter.    Follow-Up Instructions: No follow-ups on file.   Collier Salina, MD  Note - This record has been created using [employer].  Chart creation errors have been sought, but may not always  have been located. Such creation errors do not reflect on  the standard of medical care.

## 2023-01-29 ENCOUNTER — Encounter: Payer: Self-pay | Admitting: Internal Medicine

## 2023-01-29 ENCOUNTER — Ambulatory Visit: Payer: PPO | Attending: Internal Medicine | Admitting: Internal Medicine

## 2023-01-29 VITALS — BP 138/69 | HR 58 | Resp 16 | Ht 67.0 in | Wt 172.0 lb

## 2023-01-29 DIAGNOSIS — M069 Rheumatoid arthritis, unspecified: Secondary | ICD-10-CM

## 2023-01-29 DIAGNOSIS — Z79899 Other long term (current) drug therapy: Secondary | ICD-10-CM

## 2023-01-29 MED ORDER — LEFLUNOMIDE 20 MG PO TABS: 20.0000 mg | ORAL_TABLET | Freq: Every day | ORAL | 2 refills | Status: DC

## 2023-01-30 LAB — CBC WITH DIFFERENTIAL/PLATELET
Absolute Monocytes: 825 cells/uL (ref 200–950)
Basophils Absolute: 39 cells/uL (ref 0–200)
Basophils Relative: 0.7 %
Eosinophils Absolute: 220 cells/uL (ref 15–500)
Eosinophils Relative: 4 %
HCT: 35.5 % — ABNORMAL LOW (ref 38.5–50.0)
Hemoglobin: 12.1 g/dL — ABNORMAL LOW (ref 13.2–17.1)
Lymphs Abs: 484 cells/uL — ABNORMAL LOW (ref 850–3900)
MCH: 31.9 pg (ref 27.0–33.0)
MCHC: 34.1 g/dL (ref 32.0–36.0)
MCV: 93.7 fL (ref 80.0–100.0)
MPV: 11.8 fL (ref 7.5–12.5)
Monocytes Relative: 15 %
Neutro Abs: 3933 cells/uL (ref 1500–7800)
Neutrophils Relative %: 71.5 %
Platelets: 194 10*3/uL (ref 140–400)
RBC: 3.79 10*6/uL — ABNORMAL LOW (ref 4.20–5.80)
RDW: 12.1 % (ref 11.0–15.0)
Total Lymphocyte: 8.8 %
WBC: 5.5 10*3/uL (ref 3.8–10.8)

## 2023-01-30 LAB — COMPLETE METABOLIC PANEL WITH GFR
AG Ratio: 2.3 (calc) (ref 1.0–2.5)
ALT: 19 U/L (ref 9–46)
AST: 16 U/L (ref 10–35)
Albumin: 4.5 g/dL (ref 3.6–5.1)
Alkaline phosphatase (APISO): 43 U/L (ref 35–144)
BUN/Creatinine Ratio: 17 (calc) (ref 6–22)
BUN: 29 mg/dL — ABNORMAL HIGH (ref 7–25)
CO2: 26 mmol/L (ref 20–32)
Calcium: 9.8 mg/dL (ref 8.6–10.3)
Chloride: 106 mmol/L (ref 98–110)
Creat: 1.66 mg/dL — ABNORMAL HIGH (ref 0.70–1.28)
Globulin: 2 g/dL (calc) (ref 1.9–3.7)
Glucose, Bld: 83 mg/dL (ref 65–99)
Potassium: 4.9 mmol/L (ref 3.5–5.3)
Sodium: 138 mmol/L (ref 135–146)
Total Bilirubin: 0.5 mg/dL (ref 0.2–1.2)
Total Protein: 6.5 g/dL (ref 6.1–8.1)
eGFR: 44 mL/min/{1.73_m2} — ABNORMAL LOW (ref >=60)

## 2023-01-30 LAB — C-REACTIVE PROTEIN: CRP: <0.2 mg/L (ref <8.0)

## 2023-01-30 LAB — SEDIMENTATION RATE: Sed Rate: 2 mm/h (ref 0–20)

## 2023-02-06 DIAGNOSIS — N1832 Chronic kidney disease, stage 3b: Secondary | ICD-10-CM | POA: Diagnosis not present

## 2023-02-11 ENCOUNTER — Ambulatory Visit: Payer: PPO | Attending: Cardiology | Admitting: Internal Medicine

## 2023-02-11 ENCOUNTER — Encounter: Payer: Self-pay | Admitting: Internal Medicine

## 2023-02-11 ENCOUNTER — Telehealth: Payer: Self-pay

## 2023-02-11 DIAGNOSIS — I480 Paroxysmal atrial fibrillation: Secondary | ICD-10-CM

## 2023-02-11 MED ORDER — AMLODIPINE BESYLATE 5 MG PO TABS: 5.0000 mg | ORAL_TABLET | Freq: Every day | ORAL | 3 refills | Status: DC

## 2023-02-11 NOTE — Progress Notes (Signed)
Noah Grant     Electrophysiology TeleHealth Note      Date:  02/11/2023   ID:  Noah Grant, DOB Nov 11, 1951, MRN [id number]  Location: patient's home  Provider location: [address], [place]  Evaluation Performed: Follow-up visit  PCP:  [name], DO  Cardiologist:   RR Electrophysiologist:  SK   Chief Complaint: Dizziness  History of Present Illness:    Noah Grant is a 72 y.o. male who presents via audio/video conferencing for a telehealth visit today.  Since last being seen in our clinic for dizziness the patient reports feeling much much better following the discontinuation of his beta-blocker.  He had edema and his primary cardiologist Dr. Jeri Lager discontinued his amlodipine    The patient denies symptoms of fevers, chills, cough, or new SOB worrisome for COVID 19.    Past Medical History:  Diagnosis Date   Abdominal aortic atherosclerosis ([place])    Anemia    likely of chronic disease   Angina pectoris ([place]) 08/30/2020   Arthritis    Atrial fibrillation (HCC)    Atrial fibrillation with rapid ventricular response ([place]) 05/12/2022   Barrett's esophagus without dysplasia 03/25/2019   Formatting of this note might be different from the original. EGD done in 02/2019. Due in 3 years.   Bipolar 1 disorder ([place])    Bipolar 1 disorder, mixed, moderate ([place]) 08/16/2014   Bipolar affective disorder, currently depressed, moderate ([place]) 03/08/2020   Bipolar depression ([place]) 08/16/2014   BPH with urinary obstruction 12/29/2018   Brain ischemia 03/08/2020   Cardiac murmur 08/30/2020   Chest pain 09/06/2020   Chicken pox    Chronic bronchitis (HCC)    Chronic kidney disease, stage 3a ([place]) 07/21/2019   Cognitive complaints 03/08/2020   Coronary artery disease involving native coronary artery of native heart 09/06/2020   Coronary artery disease involving native coronary artery of native heart with angina pectoris ([place]) 09/06/2020   Depression    Disc  degeneration, 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 Last Assessment & Plan:  Well controlled. Continue current medications.   GERD without esophagitis 08/16/2014   Formatting of this note might be different from the original. Last Assessment & Plan:  Well controlled. Continue current medications.   Herpes gingivostomatitis 02/10/2015   High risk medication use 03/23/2018   History of colon polyps 03/25/2019   Formatting of this note might be different from the original. tubular adenoma   History of stroke 11/10/2019   Hyperlipidemia    Hypertension    Ingrowing nail 03/12/2020   Intention tremor 06/16/2019   Labile hypertension 08/16/2014   Formatting of this note might be different from the original. Last Assessment & Plan:  Slightly above goal today secondary to pain. Will continue current regimen. Will check CMP today.   Lactic acidosis 123456   Lichen planus [id number]   Last Assessment & Plan:  Formatting of this note might be different from the original. Concern over possible lichen planus. His dentist noticed lesions of his oral mucosa.  It was felt to be lichen planus.  He presents to discuss further.  Rarely does the area hurt.He smoked in the distant past EXAM shows several white patches on the buccal mucosa bilaterally.  No other concerning  oral mucosal les   Long-term use of immunosuppressant medication 03/23/2018   Low testosterone 09/17/2016   Midline thoracic back pain 02/10/2015   Mixed hyperlipidemia 08/16/2014   Myocarditis ([place]) 05/14/2022   Normochromic normocytic anemia 08/16/2014   Normocytic anemia    likely of chronic disease   PAD (peripheral artery disease) ([place]) 03/23/2019   Pain of right hip joint 01/19/2016    Pericarditis 05/12/2022   Rheumatic fever    Rheumatoid arthritis ([place]) 10/29/2018   Formatting of this note might be different from the original. Rheum:  Dr. Gerilyn Nestle Formerly on MTX - stopped due to mouth sores. Currently started on Arava.  Also on 5 mg prednisone x 3 months during run-in.   Right-sided chest wall pain 04/17/2019   Seasonal allergies 03/23/2018   Sepsis ([place]) 05/11/2022   Stasis dermatitis of both legs 07/29/2022   Statin intolerance 07/31/2020   Task-specific dystonia of hand    right hand   Thoracic radiculopathy 06/16/2019   Vitamin B12 deficiency    Injections   Vitamin D deficiency 09/17/2016    Past Surgical History:  Procedure Laterality Date   CLAVICLE SURGERY     Right, Hardware placed   CORONARY STENT INTERVENTION N/A 09/06/2020   Procedure: CORONARY STENT INTERVENTION;  Surgeon: Sherren Mocha, MD;  Location: [facility];  Service: Cardiovascular;  Laterality: N/A;   ingrown toenail Bilateral    great toes   LEFT HEART CATH AND CORONARY ANGIOGRAPHY N/A 09/06/2020   Procedure: LEFT HEART CATH AND CORONARY ANGIOGRAPHY;  Surgeon: Sherren Mocha, MD;  Location: [facility];  Service: Cardiovascular;  Laterality: N/A;   WISDOM TOOTH EXTRACTION      Current Outpatient Medications  Medication Sig Dispense Refill   Cholecalciferol 50 MCG (2000 UT) CAPS Take 2,000 Units by mouth daily.      Coenzyme Q10 (COQ10) 200 MG CAPS Take 200 mg by mouth daily.     diclofenac Sodium (VOLTAREN) 1 % GEL Apply 1 application  topically daily as needed for pain.     ELIQUIS 5 MG TABS tablet TAKE 1 TABLET BY MOUTH TWICE A DAY 60 tablet 3   ezetimibe (ZETIA) 10 MG tablet Take 1 tablet (10 mg total) by mouth daily. 90 tablet 2   finasteride (PROSCAR) 5 MG tablet TAKE 1 TABLET BY MOUTH DAILY 90 tablet 1   hydrALAZINE (APRESOLINE) 50 MG tablet Take 50 mg by mouth 3 (three) times daily.     leflunomide (ARAVA) 20 MG tablet Take 1 tablet (20 mg total) by mouth daily.  30 tablet 2   lithium carbonate 150 MG capsule TAKE THREE CAPSULES BY MOUTH DAILY 270 capsule 0   losartan (COZAAR) 100 MG tablet Take 100 mg by mouth daily.     Lysine HCl 1000 MG TABS Take 1,000 mg by mouth 2 (two) times daily.      nitroGLYCERIN (NITROSTAT) 0.4 MG SL tablet Place 1 tablet (0.4 mg total) under the tongue every 5 (five) minutes as needed for chest pain. 25 tablet 8   pantoprazole (PROTONIX) 40 MG tablet TAKE 1 TABLET BY MOUTH TWICE A DAY 180 tablet 2   simvastatin (ZOCOR) 5 MG tablet TAKE ONE TABLET BY MOUTH DAILY 90 tablet 2   vitamin B-12 (CYANOCOBALAMIN) 1000 MCG tablet Take 1,000 mcg by mouth daily.     No current facility-administered medications for this visit.    Allergies:   Methotrexate, Nsaids, Plaquenil [hydroxychloroquine], Azulfidine [sulfasalazine], Crestor [rosuvastatin], Isosorbide, Mevacor [lovastatin], and Pravachol [pravastatin]  Social History:  The patient  reports that he quit smoking about 50 years ago. His smoking use included cigarettes. He has a 20.00 pack-year smoking history. He has been exposed to tobacco smoke. He has never used smokeless tobacco. He reports that he does not currently use alcohol. He reports that he does not use drugs.   Family History:  The patient's   family history includes Alcoholism in his brother and father; Alzheimer's disease (age of onset: 75) in his mother; Heart disease (age of onset: 58) in his paternal grandfather; Heart disease (age of onset: 68) in his maternal grandfather; Hypertension in his father and son; Stroke (age of onset: 65) in his father.   ROS:  Please see the history of present illness.   All other systems are personally reviewed and negative.    Exam:    Vital Signs:  BP (!) 168/67 Comment: patient provided  Pulse 67 Comment: patient provided  Ht '5\' 7"'$  (1.702 m)   Wt 170 lb 9.6 oz (77.4 kg) Comment: patient provided  BMI 26.72 kg/m     Labs/Other Tests and Data Reviewed:    Recent  Labs: 05/12/2022: Magnesium 2.0 06/12/2022: TSH 1.27 01/29/2023: ALT 19; BUN 29; Creat 1.66; Hemoglobin 12.1; Platelets 194; Potassium 4.9; Sodium 138   Wt Readings from Last 3 Encounters:  02/11/23 170 lb 9.6 oz (77.4 kg)  01/29/23 172 lb (78 kg)  01/22/23 171 lb 9.6 oz (77.8 kg)     Other studies personally reviewed: Additional studies/ records that were reviewed today include:   Review of the above records today demonstrates:  Prior radiographs:     ASSESSMENT & PLAN:    Hypertension  Dizziness   Ectopy ventricular and atrial   Dizziness much improved with discontinuation of the beta-blocker.  Will keep him off of it.  He had infrequent ectopy which I reviewed with him.  We did not undertake therapies if it is a problem but most of his symptoms were associated with sinus rhythm.  His blood pressure significantly elevated, previous dose of amlodipine was 10 mg, with his elevated blood pressure, we will resume the amlodipine but at 5 mg and hopefully thereby obviate some of his bradycardia and take care of his blood pressure.        Follow-up: With Dr. Jeri Lager we are glad to see as needed    Current medicines are reviewed at length with the patient today.   The patient does not have concerns regarding his medicines.  The following changes were made today: Resume amlodipine but at 5 mg  Labs/ tests ordered today include:  No orders of the defined types were placed in this encounter.     Today, I have spent 11 minutes with the patient with telehealth technology discussing the above.  Signed, Virl Axe, MD  02/11/2023 5:34 PM     [address] [place] [id number] [phone] (office) [phone] (fax)

## 2023-02-11 NOTE — Telephone Encounter (Signed)
..   Pt understands that although there may be some limitations with this type of visit, we will take all precautions to reduce any security or privacy concerns.  Pt understands that this will be treated like an in office visit and we will file with pt's insurance, and there may be a patient responsible charge related to this service. ? ?

## 2023-02-14 DIAGNOSIS — N1832 Chronic kidney disease, stage 3b: Secondary | ICD-10-CM | POA: Diagnosis not present

## 2023-02-14 DIAGNOSIS — I129 Hypertensive chronic kidney disease with stage 1 through stage 4 chronic kidney disease, or unspecified chronic kidney disease: Secondary | ICD-10-CM | POA: Diagnosis not present

## 2023-02-14 DIAGNOSIS — Z87891 Personal history of nicotine dependence: Secondary | ICD-10-CM | POA: Diagnosis not present

## 2023-02-14 DIAGNOSIS — M069 Rheumatoid arthritis, unspecified: Secondary | ICD-10-CM | POA: Diagnosis not present

## 2023-02-24 ENCOUNTER — Encounter: Payer: Self-pay | Admitting: Family Medicine

## 2023-02-24 ENCOUNTER — Ambulatory Visit (INDEPENDENT_AMBULATORY_CARE_PROVIDER_SITE_OTHER): Payer: PPO | Admitting: Family Medicine

## 2023-02-24 VITALS — BP 142/80 | HR 73 | Temp 98.4°F | Ht 67.0 in | Wt 167.1 lb

## 2023-02-24 DIAGNOSIS — I10 Essential (primary) hypertension: Secondary | ICD-10-CM | POA: Diagnosis not present

## 2023-02-24 DIAGNOSIS — K219 Gastro-esophageal reflux disease without esophagitis: Secondary | ICD-10-CM | POA: Diagnosis not present

## 2023-02-24 DIAGNOSIS — I4891 Unspecified atrial fibrillation: Secondary | ICD-10-CM

## 2023-02-24 MED ORDER — CARVEDILOL 3.125 MG PO TABS: 3.1250 mg | ORAL_TABLET | Freq: Two times a day (BID) | ORAL | 3 refills | Status: DC

## 2023-02-24 MED ORDER — OMEPRAZOLE 40 MG PO CPDR: 40.0000 mg | DELAYED_RELEASE_CAPSULE | Freq: Two times a day (BID) | ORAL | 3 refills | Status: DC

## 2023-02-24 MED ORDER — AMLODIPINE BESYLATE 5 MG PO TABS: 10.0000 mg | ORAL_TABLET | Freq: Every day | ORAL | 3 refills | Status: DC

## 2023-02-24 MED ORDER — RIVAROXABAN 15 MG PO TABS: 15.0000 mg | ORAL_TABLET | Freq: Every day | ORAL | 5 refills | Status: DC

## 2023-02-24 NOTE — Patient Instructions (Signed)
Keep the diet clean and stay active.  Check your blood pressures 2-3 times per week, alternating the time of day you check it. If it is high, considering waiting 1-2 minutes and rechecking. If it gets higher, your anxiety is likely creeping up and we should avoid rechecking.   Let me know if there are issues with the new medicine.  Let us know if you need anything.

## 2023-02-24 NOTE — Progress Notes (Signed)
Chief Complaint  Patient presents with   Follow-up    Medication  Patient is now taking 2 of the Amlodipine 5 mg to equal 10 mg daily per [facility] doctor instructions..    Subjective Noah Grant is a 72 y.o. male who presents for hypertension follow up. He does monitor home blood pressures. Blood pressures ranging from 150-170's/60's on average. He is compliant with medication- Norvasc 10 mg/d, losartan 100 mg/d. Patient has these side effects of medication: none He is sometimes adhering to a healthy diet overall. Current exercise: Some walking, limited No new CP or SOB.   GERD- Hx of reflux. Currently taking Protonix 40 mg twice daily.  He notes a better effect when he is taking omeprazole is wondering if he can go back.  No adverse effects but is simply not as effective.  No difficulty swallowing, bleeding, unintentional weight loss.  Patient has a history of atrial fibrillation.  He is currently anticoagulated with Eliquis 5 mg twice daily.  On this medication, he is compliant.  He has the adverse effect of cold intolerance while taking the medication.  He did not have this prior to starting the medication.   Past Medical History:  Diagnosis Date   Abdominal aortic atherosclerosis ([place])    Anemia    likely of chronic disease   Angina pectoris ([place]) 08/30/2020   Arthritis    Atrial fibrillation (HCC)    Atrial fibrillation with rapid ventricular response ([place]) 05/12/2022   Barrett's esophagus without dysplasia 03/25/2019   Formatting of this note might be different from the original. EGD done in 02/2019. Due in 3 years.   Bipolar 1 disorder ([place])    Bipolar 1 disorder, mixed, moderate ([place]) 08/16/2014   Bipolar affective disorder, currently depressed, moderate ([place]) 03/08/2020   Bipolar depression ([place]) 08/16/2014   BPH with urinary obstruction 12/29/2018   Brain ischemia 03/08/2020   Cardiac murmur 08/30/2020   Chest pain 09/06/2020   Chicken pox    Chronic bronchitis  (HCC)    Chronic kidney disease, stage 3a ([place]) 07/21/2019   Cognitive complaints 03/08/2020   Coronary artery disease involving native coronary artery of native heart 09/06/2020   Coronary artery disease involving native coronary artery of native heart with angina pectoris ([place]) 09/06/2020   Depression    Disc degeneration, 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 Last Assessment & Plan:  Well controlled. Continue current medications.   GERD without esophagitis 08/16/2014   Formatting of this note might be different from the original. Last Assessment & Plan:  Well controlled. Continue current medications.   Herpes gingivostomatitis 02/10/2015   High risk medication use 03/23/2018   History of colon polyps 03/25/2019   Formatting of this note might be different from the original. tubular adenoma   History of stroke 11/10/2019   Hyperlipidemia    Hypertension    Ingrowing nail 03/12/2020   Intention tremor 06/16/2019   Labile hypertension 08/16/2014   Formatting of this note might be different from the original. Last Assessment & Plan:  Slightly above goal today secondary to pain. Will continue current regimen. Will check CMP today.   Lactic acidosis 123456   Lichen planus [id number]   Last Assessment & Plan:  Formatting of this note might be different  from the original. Concern over possible lichen planus. His dentist noticed lesions of his oral mucosa.  It was felt to be lichen planus.  He presents to discuss further.  Rarely does the area hurt.He smoked in the distant past EXAM shows several white patches on the buccal mucosa bilaterally.  No other concerning oral mucosal les   Long-term use of immunosuppressant medication 03/23/2018   Low  testosterone 09/17/2016   Midline thoracic back pain 02/10/2015   Mixed hyperlipidemia 08/16/2014   Myocarditis ([place]) 05/14/2022   Normochromic normocytic anemia 08/16/2014   Normocytic anemia    likely of chronic disease   PAD (peripheral artery disease) ([place]) 03/23/2019   Pain of right hip joint 01/19/2016   Pericarditis 05/12/2022   Rheumatic fever    Rheumatoid arthritis ([place]) 10/29/2018   Formatting of this note might be different from the original. Rheum:  Dr. Gerilyn Nestle Formerly on MTX - stopped due to mouth sores. Currently started on Arava.  Also on 5 mg prednisone x 3 months during run-in.   Right-sided chest wall pain 04/17/2019   Seasonal allergies 03/23/2018   Sepsis ([place]) 05/11/2022   Stasis dermatitis of both legs 07/29/2022   Statin intolerance 07/31/2020   Task-specific dystonia of hand    right hand   Thoracic radiculopathy 06/16/2019   Vitamin B12 deficiency    Injections   Vitamin D deficiency 09/17/2016    Exam BP (!) 142/80 (BP Location: Left Arm, Cuff Size: Normal)   Pulse 73   Temp 98.4 F (36.9 C) (Oral)   Ht 5\' 7"  (1.702 m)   Wt 167 lb 2 oz (75.8 kg)   SpO2 98%   BMI 26.18 kg/m  General:  well developed, well nourished, in no apparent distress Heart: RRR, no bruits, no LE edema Lungs: clear to auscultation, no accessory muscle use Abdomen: Bowel sounds present, soft, nontender, nondistended Psych: well oriented with normal range of affect and appropriate judgment/insight  Gastroesophageal reflux disease, unspecified whether esophagitis present  Atrial fibrillation with rapid ventricular response ([place]) - Plan: Rivaroxaban (XARELTO) 15 MG TABS tablet  Essential hypertension - Plan: carvedilol (COREG) 3.125 MG tablet  Chronic, not fully controlled.  Go back on omeprazole 40 mg twice daily.  Counseled on reflux precautions.  No red flag signs/symptoms on exam/history.   Chronic, adverse effect of medication.  Change Eliquis to Xarelto, 50 mg daily based  off of his renal function.   Chronic, uncontrolled.  Continue amlodipine 10 mg daily, losartan 100 mg daily, hydralazine 50 mg 3 times daily, add carvedilol 3.125 mg twice daily.  He will let me know if he starts feeling crummy again.  I think this would also help control his irregular heartbeats in addition to his blood pressure.  Counseled on diet and exercise. F/u in 1 month to recheck. The patient voiced understanding and agreement to the plan.  [place], DO 02/24/23  3:16 PM

## 2023-02-25 ENCOUNTER — Telehealth: Payer: Self-pay | Admitting: Pharmacist

## 2023-02-25 NOTE — Progress Notes (Signed)
Patient was noted to have failed SPC (Statin therapy for persons with cardiovascular disease) measure for 2023.  He is taking low dose simvastatin 5mg  once a day and ezetimibe 10mg  daily.   He has noted history of intolerance to the following statin medications - rosuvastatin, lovastatin and pravastatin.   He is also taking leflunomide for rheumatoid arthritis.  Teriflunomide, the active metabolite of leflunomide, inhibits the activity of Organic Anion Transporting Polypeptide B1 and B3 (OATP1B1/1B3).  Concomitant use of leflunomide with drugs in the OATP family, especially HMG-CoA reductase inhibitors / statins, consider reducing the dose of statin and monitoring patients closely for signs and symptoms of increased exposure / myopathy.  Patient will see PCP in April - consider rechecking lipid panel.  Previous LDL was at goal of < 55 for patients with CAD. Tg were slightly elevated.   If Tg or LDL not at goal, then could consider atorvastatin 10mg  to see if better triglyceride lowering but would need to monitor for myopathy with any statin changes.   If LDL and Tg are at goal with next lipid check, then would continue current therapy with low dose simvastatin + ezetimibe.   Lab Results  Component Value Date   CHOL 107 01/02/2022   HDL 28.90 (L) 01/02/2022   LDLCALC 39 01/02/2022   LDLDIRECT 99.0 12/01/2019   TRIG 193.0 (H) 01/02/2022   CHOLHDL 4 01/02/2022   Cherre Robins, PharmD Clinical Pharmacist [facility]

## 2023-03-04 ENCOUNTER — Ambulatory Visit: Payer: PPO | Admitting: Psychiatry

## 2023-03-04 ENCOUNTER — Encounter: Payer: Self-pay | Admitting: Psychiatry

## 2023-03-04 DIAGNOSIS — R251 Tremor, unspecified: Secondary | ICD-10-CM

## 2023-03-04 DIAGNOSIS — N1831 Chronic kidney disease, stage 3a: Secondary | ICD-10-CM

## 2023-03-04 DIAGNOSIS — F411 Generalized anxiety disorder: Secondary | ICD-10-CM

## 2023-03-04 DIAGNOSIS — Z79899 Other long term (current) drug therapy: Secondary | ICD-10-CM

## 2023-03-04 DIAGNOSIS — F3132 Bipolar disorder, current episode depressed, moderate: Secondary | ICD-10-CM

## 2023-03-04 NOTE — Progress Notes (Signed)
Noah Grant [id number] [birth year] 72 y.o.  Subjective:   Patient ID:  Noah Grant is a 72 y.o. (DOB [birth year]) male.  Chief Complaint:  Chief Complaint  Patient presents with   Follow-up   Depression   Anxiety    Depression        Associated symptoms include fatigue.  Associated symptoms include no decreased concentration and no suicidal ideas.  Past medical history includes anxiety.   Anxiety Symptoms include nervous/anxious behavior. Patient reports no chest pain, confusion, decreased concentration or suicidal ideas.    Medication Refill Associated symptoms include arthralgias and fatigue. Pertinent negatives include no chest pain or weakness.   Noah Grant presents to the office today for follow-up of bipolar, GAD, poor STM.  seen May 18, 2019 and 11/17/2019 without med changes.    05/17/2020 appointment with the following noted: Word recall problems with neuropsych testing by Dr. Orie Fisherman inconclusive results.  Test reviewed. Still tremor can interfere with eating. Nocturia Dt overactive bladder. Overall mentally ok but too many medical concerns with RA and muscle pain.   Chronic pain issues ongoing.   Htn controlled.  Plan: Lithium level from January 2021 0.8 on 600 mg daily. Gradually it's crept up.  Disc in detail it's relation to renal function and Cr has increased gradually. BC tremor & jerks reduce to 450 mg daily.  02/12/2021 appointment with the following noted: Reduced ithium with no change in tremor. End of December, CVD stent placement and reaction to contrast dye.   Cardiologists ask if he can come off CBZ.   DT DDI. In the last year has felet mentally better than ever.   Plan: Reduce carbamazepine by 1/2 tablet every week until off of it.  04/09/2021 appointment with the following noted: Off CBZ and didn't have any problems.  No worse for mood or anxiety.  Never slept good in life.  Melatonin caused dizziness. Checked lithium level. 06  No  change in tremors in a long time.  Maninly noticeable with fine motor control. Depression is somewhat chronic and waxes and wanes dependent on health issues.  Avoids stressors if possible.  Plan no med changes  10/10/21 appt noted: No Covid.  Hx TIA's.  Tremor seems a little worse but also has RA and started infusions last week. Continues lithium 450 mg HS as only psych med. Patient reports stable mood and denie irritable moods except as noted.  Patient denies any recent difficulty with anxiety except situational.  Patient denies unusual difficulty with sleep initiation or maintenance except awakens with pain chronically. Denies appetite disturbance.  Patient reports that energy and motivation have been good.  Patient denies any difficulty with concentration  But history of word finding from ministrokes..  Patient denies any suicidal ideation.  04/09/22 appt noted: Doing well overall.  RA is a lot worse and changing rheumatologist.  Is tired and anemic with the meds. Mood overall is stable and pleased.  Deaf in left ear.  Tried hearing aids and overall minimal improvement.   Continues  lithium 450 mg HS as only psych med. Tremor is some worse. History TIA's or ministrokes may be contributing.  10/10/22 appt noted: Continues  lithium 450 mg HS as only psych med. 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 Wife survived breast CA. Decline in kidney function.  Pending FU in November. Mood has been ok.  Anxiety is manageable. Tremor is worse and worse as day progresses  03/04/23 appt noted: Continues  lithium 450  mg HS as only psych med. On Xarelto but wants to get off it. Nephro notes Cr 1.4 up to 1.8 07/2022; last 01/29/23 =1.66.  Rec he disc alternative to lithium.   Patient reports stable mood and denies depressed or irritable moods.  Patient denies any recent difficulty with anxiety.  Patient denies difficulty with sleep initiation or maintenance. Denies appetite  disturbance.  Patient reports that energy and motivation have been good.  Patient denies any difficulty with concentration.  Patient denies any suicidal ideation. Card Revankar, [place]  Has seen nephorologists and rheumatologists and neurologist, [facility].   Lives in [place] now  Previous psych med trials are extensive and include meds for anxiety and mood.  These include  clonidine, buspirone,  Abilify 5 mg,  olanzapine, Geodon, Depakote SE, CBZ,  gabapentin sedation , lamotrigine, bupropion, Strattera,  Prozac, Serzone 700 mg daily, sertraline, duloxetine,  Pristiq which caused rage, mirtazapine, Pamelor, amitriptyline 450 mg/day, Ritalin, Provigil,  temazepam,  Ambien, ProSom, trazodone which was ineffective.    Cerefolin NAC,  Thinks he tried propranolol which caused back pain  Review of Systems:  Review of Systems  Constitutional:  Positive for fatigue.  Cardiovascular:  Negative for chest pain.  Musculoskeletal:  Positive for arthralgias and gait problem.  Neurological:  Positive for tremors. Negative for weakness.       Occ jerks  Psychiatric/Behavioral:  Positive for depression. Negative for behavioral problems, confusion, decreased concentration, dysphoric mood, hallucinations, self-injury, sleep disturbance and suicidal ideas. The patient is nervous/anxious. The patient is not hyperactive.     Medications: I have reviewed the patient's current medications.  Current Outpatient Medications  Medication Sig Dispense Refill   amLODipine (NORVASC) 5 MG tablet Take 2 tablets (10 mg total) by mouth daily. 30 tablet 3   Cholecalciferol 50 MCG (2000 UT) CAPS Take 2,000 Units by mouth daily.      Coenzyme Q10 (COQ10) 200 MG CAPS Take 200 mg by mouth daily.     diclofenac Sodium (VOLTAREN) 1 % GEL Apply 1 application  topically daily as needed for pain.     ezetimibe (ZETIA) 10 MG tablet Take 1 tablet (10 mg total) by mouth daily. 90 tablet 2   finasteride (PROSCAR) 5  MG tablet TAKE 1 TABLET BY MOUTH DAILY 90 tablet 1   hydrALAZINE (APRESOLINE) 50 MG tablet Take 50 mg by mouth 3 (three) times daily.     losartan (COZAAR) 100 MG tablet Take 100 mg by mouth daily.     Lysine HCl 1000 MG TABS Take 1,000 mg by mouth 2 (two) times daily.      omeprazole (PRILOSEC) 40 MG capsule Take 1 capsule (40 mg total) by mouth in the morning and at bedtime. 180 capsule 3   simvastatin (ZOCOR) 5 MG tablet TAKE ONE TABLET BY MOUTH DAILY 90 tablet 2   vitamin B-12 (CYANOCOBALAMIN) 1000 MCG tablet Take 1,000 mcg by mouth daily.     ELIQUIS 5 MG TABS tablet TAKE 1 TABLET BY MOUTH TWICE A DAY 60 tablet 3   leflunomide (ARAVA) 20 MG tablet TAKE 1 TABLET BY MOUTH DAILY 90 tablet 0   lithium carbonate 150 MG capsule Take 3 capsules (450 mg total) by mouth daily. 270 capsule 1   nitroGLYCERIN (NITROSTAT) 0.4 MG SL tablet Place 1 tablet (0.4 mg total) under the tongue every 5 (five) minutes as needed for chest pain. 25 tablet 4   No current facility-administered medications for this visit.    Medication Side  Effects: None  Allergies:  Allergies  Allergen Reactions   Methotrexate Dermatitis    Developed Mouth Sores   Nsaids Dermatitis    Developed Mouth Blisters   Plaquenil [Hydroxychloroquine] Other (See Comments)    Terrible Nightmares   Azulfidine [Sulfasalazine] Nausea Only   Crestor [Rosuvastatin] Other (See Comments)    Caused abdominal pain that radiated to the back   Isosorbide Other (See Comments)    Made him feel weak   Mevacor [Lovastatin] Other (See Comments)    Caused abdominal pain that radiated to the back   Pravachol [Pravastatin] Other (See Comments)    Caused abdominal pain that radiated to the back    Past Medical History:  Diagnosis Date   Abdominal aortic atherosclerosis (HCC)    Anemia    likely of chronic disease   Angina pectoris (HCC) 08/30/2020   Arthritis    Atrial fibrillation (HCC)    Atrial fibrillation with rapid ventricular response  (HCC) 05/12/2022   Barrett's esophagus without dysplasia 03/25/2019   Formatting of this note might be different from the original. EGD done in 02/2019. Due in 3 years.   Bipolar 1 disorder (HCC)    Bipolar 1 disorder, mixed, moderate (HCC) 08/16/2014   Bipolar affective disorder, currently depressed, moderate (HCC) 03/08/2020   Bipolar depression (HCC) 08/16/2014   BPH with urinary obstruction 12/29/2018   Brain ischemia 03/08/2020   Cardiac murmur 08/30/2020   Chest pain 09/06/2020   Chicken pox    Chronic bronchitis (HCC)    Chronic kidney disease, stage 3a (HCC) 07/21/2019   Cognitive complaints 03/08/2020   Coronary artery disease involving native coronary artery of native heart 09/06/2020   Coronary artery disease involving native coronary artery of native heart with angina pectoris (HCC) 09/06/2020   Depression    Disc degeneration, 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 Last Assessment & Plan:  Well controlled. Continue current medications.   GERD without esophagitis 08/16/2014   Formatting of this note might be different from the original. Last Assessment & Plan:  Well controlled. Continue current medications.   Herpes gingivostomatitis 02/10/2015   High risk medication use 03/23/2018   History of colon polyps 03/25/2019   Formatting of this note might be different from the original. tubular adenoma   History of stroke 11/10/2019   Hyperlipidemia    Hypertension    Ingrowing nail 03/12/2020   Intention tremor 06/16/2019   Labile hypertension 08/16/2014   Formatting of this note might be different from the original. Last Assessment & Plan:  Slightly above goal today secondary to pain. Will continue current regimen. Will check CMP  today.   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 Concern over possible lichen planus. His dentist noticed lesions of his oral mucosa.  It was felt to be lichen planus.  He presents to discuss further.  Rarely does the area hurt.He smoked in the distant past EXAM shows several white patches on the buccal mucosa bilaterally.  No other concerning oral mucosal les   Long-term use of immunosuppressant medication 03/23/2018   Low testosterone 09/17/2016   Midline thoracic back pain 02/10/2015   Mixed hyperlipidemia 08/16/2014  Myocarditis (HCC) 05/14/2022   Normochromic normocytic anemia 08/16/2014   Normocytic anemia    likely of chronic disease   PAD (peripheral artery disease) (HCC) 03/23/2019   Pain of right hip joint 01/19/2016   Pericarditis 05/12/2022   Rheumatic fever    Rheumatoid arthritis (HCC) 10/29/2018   Formatting of this note might be different from the original. Rheum:  Dr. Sharmon Revere Formerly on MTX - stopped due to mouth sores. Currently started on Arava.  Also on 5 mg prednisone x 3 months during run-in.   Right-sided chest wall pain 04/17/2019   Seasonal allergies 03/23/2018   Sepsis (HCC) 05/11/2022   Stasis dermatitis of both legs 07/29/2022   Statin intolerance 07/31/2020   Task-specific dystonia of hand    right hand   Thoracic radiculopathy 06/16/2019   Vitamin B12 deficiency    Injections   Vitamin D deficiency 09/17/2016    Family History  Problem Relation Age of Onset   Alzheimer's disease Mother 98       Deceased in early 13s   Stroke Father 71       Deceased   Hypertension Father    Alcoholism Father    Alcoholism Brother    Heart disease Maternal Grandfather 36       Deceased   Heart disease Paternal Grandfather 22       Deceased   Hypertension Son      Past Medical History, Surgical history, Social history, and Family history were reviewed and  updated as appropriate.   Please see review of systems for further details on the patient's review from today.   Objective:   Physical Exam:  There were no vitals taken for this visit.  Physical Exam Constitutional:      General: He is not in acute distress.    Appearance: He is well-developed.  Neurological:     Mental Status: He is alert and oriented to person, place, and time.     Coordination: Coordination normal.  Psychiatric:        Attention and Perception: Attention normal. He is attentive.        Mood and Affect: Mood normal. Mood is not anxious or depressed. Affect is not labile, blunt, angry or inappropriate.        Speech: Speech normal.        Behavior: Behavior normal.        Thought Content: Thought content normal. Thought content is not delusional. Thought content does not include homicidal or suicidal ideation. Thought content does not include suicidal plan.        Cognition and Memory: Cognition normal.        Judgment: Judgment normal.     Comments: Insight is good. Good humor.     Lab Review:     Component Value Date/Time   NA 141 03/31/2023 1200   NA 141 11/28/2020 0841   K 4.4 03/31/2023 1200   CL 107 03/31/2023 1200   CO2 27 03/31/2023 1200   GLUCOSE 85 03/31/2023 1200   BUN 20 03/31/2023 1200   BUN 17 11/28/2020 0841   CREATININE 1.36 (H) 03/31/2023 1200   CALCIUM 9.4 03/31/2023 1200   PROT 6.6 03/31/2023 1200   PROT 5.9 (L) 11/28/2020 0841   ALBUMIN 2.8 (L) 05/12/2022 0201   ALBUMIN 4.2 11/28/2020 0841   AST 17 03/31/2023 1200   ALT 18 03/31/2023 1200   ALKPHOS 44 05/12/2022 0201   BILITOT 0.3 03/31/2023 1200   BILITOT 0.3 11/28/2020 0841  GFRNONAA 59 (L) 05/15/2022 0306   GFRAA 75 11/28/2020 0841       Component Value Date/Time   WBC 4.9 03/31/2023 1200   RBC 3.63 (L) 03/31/2023 1200   HGB 11.1 (L) 03/31/2023 1200   HGB 10.8 (L) 08/30/2020 1139   HCT 33.3 (L) 03/31/2023 1200   HCT 32.4 (L) 08/30/2020 1139   PLT 188 03/31/2023  1200   PLT 212 08/30/2020 1139   MCV 91.7 03/31/2023 1200   MCV 92 08/30/2020 1139   MCH 30.6 03/31/2023 1200   MCHC 33.3 03/31/2023 1200   RDW 11.6 03/31/2023 1200   RDW 12.0 08/30/2020 1139   LYMPHSABS 568 (L) 03/31/2023 1200   LYMPHSABS 0.5 (L) 08/30/2020 1139   MONOABS 1.3 (H) 05/12/2022 0201   EOSABS 201 03/31/2023 1200   EOSABS 0.3 08/30/2020 1139   BASOSABS 49 03/31/2023 1200   BASOSABS 0.1 08/30/2020 1139    Lithium Lvl  Date Value Ref Range Status  05/12/2022 0.45 (L) 0.60 - 1.20 mmol/L Final    Comment:    Performed at [facility], [address] [place], [place] [address]     No results found for: "PHENYTOIN", "PHENOBARB", "VALPROATE", "CBMZ"   .res Assessment: Plan:    Bipolar disorder with moderate depression (HCC)  Generalized anxiety disorder  Lithium use  Tremor  Chronic kidney disease, stage 3a (HCC)   He has failed multiple other psychiatric medications and does not wish to have further med changes today. Only current psych med is lithium 450 mg daily .  And his mood is good and stable.  Lithium level from January 2021 0.8 on 600 mg daily. Gradually it's crept up.  Disc in detail it's relation to renal function and Cr has increased gradually. CR 1.35 01/18/21  Creatinine 1.75 07/2022, 01/2023 CR 1.66 Continue lithium 450 mg daily but tremor did not get better with reduction last visit. Lithium level 10/18/21 = 0.6, 05/12/22 lithium =0.45  Counseled patient regarding potential benefits, risks, and side effects of lithium to include potential risk of lithium affecting thyroid and renal function.  Discussed need for periodic lab monitoring to determine drug level and to assess for potential adverse effects.  Counseled patient regarding signs and symptoms of lithium toxicity and advised that they notify office immediately or seek urgent medical attention if experiencing these signs and symptoms.  Patient advised to contact office with any questions or  concerns.  Disc risk mania from occ use of prednisone.  He does feel edgy on it.  Disc best alternative mood stabilizer Trileptal but still some Ddi issues but less than CBZ He's failed multiple others.  3-4 mos  Meredith Staggers, MD, DFAPA   Please see After Visit Summary for patient specific instructions.  Future Appointments  Date Time Provider Department Center  06/03/2023  9:20 AM Revankar, Aundra Dubin, MD [facility] None  06/30/2023 10:40 AM Dimple Casey, Jamesetta Orleans, MD [facility] None  09/08/2023 10:00 AM Cottle, Steva Ready., MD [facility] None  09/22/2023 10:15 AM Carmelia Roller, Jilda Roche, DO [facility]    No orders of the defined types were placed in this encounter.      -------------------------------

## 2023-03-21 ENCOUNTER — Encounter: Payer: Self-pay | Admitting: Family Medicine

## 2023-03-21 ENCOUNTER — Other Ambulatory Visit: Payer: Self-pay | Admitting: Psychiatry

## 2023-03-21 ENCOUNTER — Ambulatory Visit (INDEPENDENT_AMBULATORY_CARE_PROVIDER_SITE_OTHER): Payer: PPO | Admitting: Family Medicine

## 2023-03-21 VITALS — BP 138/82 | HR 68 | Temp 98.9°F | Ht 67.0 in | Wt 168.0 lb

## 2023-03-21 DIAGNOSIS — I4891 Unspecified atrial fibrillation: Secondary | ICD-10-CM | POA: Diagnosis not present

## 2023-03-21 DIAGNOSIS — F3132 Bipolar disorder, current episode depressed, moderate: Secondary | ICD-10-CM

## 2023-03-21 DIAGNOSIS — I10 Essential (primary) hypertension: Secondary | ICD-10-CM | POA: Diagnosis not present

## 2023-03-21 MED ORDER — DABIGATRAN ETEXILATE MESYLATE 150 MG PO CAPS: 150.0000 mg | ORAL_CAPSULE | Freq: Two times a day (BID) | ORAL | 5 refills | Status: DC

## 2023-03-21 NOTE — Progress Notes (Signed)
Chief Complaint  Patient presents with   Follow-up    Subjective Noah Grant is a 72 y.o. male who presents for hypertension follow up. He does not monitor home blood pressures. He is compliant with medications- Coreg 3.125 mg bid, Norvasc 10 mg/d, hydralazine 50 mg TID, losartan 100 mg/d. Patient has these side effects of medication: freq urination He is adhering to a healthy diet overall. Current exercise: some walking No CP or SOB.   Paroxysmal atrial fibrillation The patient was changed from Eliquis to Xarelto and is paying more money in addition to feeling very cold.  He would like to stop the medication.  He is unsure when his next follow-up with his cardiologist is.  He does not feel that he is in A-fib.   Past Medical History:  Diagnosis Date   Abdominal aortic atherosclerosis    Anemia    likely of chronic disease   Angina pectoris 08/30/2020   Arthritis    Atrial fibrillation    Atrial fibrillation with rapid ventricular response 05/12/2022   Barrett's esophagus without dysplasia 03/25/2019   Formatting of this note might be different from the original. EGD done in 02/2019. Due in 3 years.   Bipolar 1 disorder    Bipolar 1 disorder, mixed, moderate 08/16/2014   Bipolar affective disorder, currently depressed, moderate 03/08/2020   Bipolar depression 08/16/2014   BPH with urinary obstruction 12/29/2018   Brain ischemia 03/08/2020   Cardiac murmur 08/30/2020   Chest pain 09/06/2020   Chicken pox    Chronic bronchitis    Chronic kidney disease, stage 3a 07/21/2019   Cognitive complaints 03/08/2020   Coronary artery disease involving native coronary artery of native heart 09/06/2020   Coronary artery disease involving native coronary artery of native heart with angina pectoris 09/06/2020   Depression    Disc degeneration, 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 Last Assessment & Plan:  Well controlled. Continue current medications.   GERD without esophagitis 08/16/2014   Formatting of this note might be different from the original. Last Assessment & Plan:  Well controlled. Continue current medications.   Herpes gingivostomatitis 02/10/2015   High risk medication use 03/23/2018   History of colon polyps 03/25/2019   Formatting of this note might be different from the original. tubular adenoma   History of stroke 11/10/2019   Hyperlipidemia    Hypertension    Ingrowing nail 03/12/2020   Intention tremor 06/16/2019   Labile hypertension 08/16/2014   Formatting of this note might be different from the original. Last Assessment & Plan:  Slightly above goal today secondary to pain. Will continue current regimen. Will check CMP today.   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 Concern over possible lichen planus. His dentist noticed lesions of his oral mucosa.  It was felt to be lichen planus.  He presents to discuss further.  Rarely does the area hurt.He smoked in the distant past EXAM shows several white patches on the buccal mucosa bilaterally.  No other concerning oral mucosal les   Long-term use of immunosuppressant medication 03/23/2018   Low testosterone 09/17/2016   Midline thoracic back pain 02/10/2015  Mixed hyperlipidemia 08/16/2014   Myocarditis 05/14/2022   Normochromic normocytic anemia 08/16/2014   Normocytic anemia    likely of chronic disease   PAD (peripheral artery disease) 03/23/2019   Pain of right hip joint 01/19/2016   Pericarditis 05/12/2022   Rheumatic fever    Rheumatoid arthritis 10/29/2018   Formatting of this note might be different from the original. Rheum:  Dr. Sharmon Revere  Formerly on MTX - stopped due to mouth sores. Currently started on Arava.  Also on 5 mg prednisone x 3 months during run-in.   Right-sided chest wall pain 04/17/2019   Seasonal allergies 03/23/2018   Sepsis 05/11/2022   Stasis dermatitis of both legs 07/29/2022   Statin intolerance 07/31/2020   Task-specific dystonia of hand    right hand   Thoracic radiculopathy 06/16/2019   Vitamin B12 deficiency    Injections   Vitamin D deficiency 09/17/2016    Exam BP 138/82 (BP Location: Left Arm, Patient Position: Sitting, Cuff Size: Normal)   Pulse 68   Temp 98.9 F (37.2 C) (Oral)   Ht 5\' 7"  (1.702 m)   Wt 168 lb (76.2 kg)   SpO2 98%   BMI 26.31 kg/m  General:  well developed, well nourished, in no apparent distress Heart: RRR, no bruits Lungs: clear to auscultation, no accessory muscle use Psych: well oriented with normal range of affect and appropriate judgment/insight  Essential hypertension  Atrial fibrillation with rapid ventricular response  Chronic, currently stable.  Continue losartan 100 mg daily, amlodipine 10 mg daily, hydralazine 50 mg 3 times daily, will stay away from carvedilol.  Counseled on diet and exercise. Chronic, adverse effect of medication.  Needs to follow-up with cardiology.  We will stop his Xarelto and change over to Pradaxa. F/u in 6 months for physical or as needed. The patient voiced understanding and agreement to the plan.  Jilda Roche [place], DO 03/21/23  12:19 PM

## 2023-03-21 NOTE — Telephone Encounter (Signed)
Don't send yet.  Reevaluating options

## 2023-03-21 NOTE — Patient Instructions (Addendum)
We could consider changing your medicine from Xarelto to Pradaxa. Call your insurance company to see if they cover it.   Keep the diet clean and stay active.  Please call Dr. Kem Parkinson office: [phone]   Let us know if you need anything.

## 2023-03-24 ENCOUNTER — Ambulatory Visit: Payer: PPO | Admitting: Family Medicine

## 2023-03-25 ENCOUNTER — Ambulatory Visit: Payer: PPO | Admitting: Family Medicine

## 2023-03-25 ENCOUNTER — Telehealth: Payer: Self-pay

## 2023-03-25 ENCOUNTER — Encounter: Payer: Self-pay | Admitting: Family Medicine

## 2023-03-25 ENCOUNTER — Other Ambulatory Visit: Payer: Self-pay | Admitting: Family Medicine

## 2023-03-25 NOTE — Telephone Encounter (Signed)
PA initiated via Covermymeds; KEY: [id number]. Awaiting determination.

## 2023-03-25 NOTE — Telephone Encounter (Signed)
Patient informed ok. Needs refill

## 2023-03-25 NOTE — Telephone Encounter (Signed)
Called the patient informed of approval. The patient said that if this is still too expensive can he go back on Eliquis?

## 2023-03-25 NOTE — Telephone Encounter (Signed)
PA approved.   [id number]:[id number]-24 Dabigatran Etexilate Mesylate  OR CAPS Quantity:60;

## 2023-03-25 NOTE — Telephone Encounter (Signed)
Ok to refill? If pradaxa (PA was approved) is too expensive he wants to start back taking Eliquis.

## 2023-03-25 NOTE — Telephone Encounter (Signed)
Called left message to call back 

## 2023-03-31 ENCOUNTER — Encounter: Payer: Self-pay | Admitting: Internal Medicine

## 2023-03-31 ENCOUNTER — Ambulatory Visit: Payer: PPO | Attending: Internal Medicine | Admitting: Internal Medicine

## 2023-03-31 VITALS — BP 151/66 | HR 54 | Resp 12 | Ht 67.0 in | Wt 168.0 lb

## 2023-03-31 DIAGNOSIS — I309 Acute pericarditis, unspecified: Secondary | ICD-10-CM | POA: Diagnosis not present

## 2023-03-31 DIAGNOSIS — M069 Rheumatoid arthritis, unspecified: Secondary | ICD-10-CM | POA: Diagnosis not present

## 2023-03-31 DIAGNOSIS — Z79899 Other long term (current) drug therapy: Secondary | ICD-10-CM | POA: Diagnosis not present

## 2023-03-31 LAB — CBC WITH DIFFERENTIAL/PLATELET
MCH: 30.6 pg (ref 27.0–33.0)
RBC: 3.63 10*6/uL — ABNORMAL LOW (ref 4.20–5.80)
RDW: 11.6 % (ref 11.0–15.0)

## 2023-03-31 MED ORDER — LIDOCAINE HCL 1 % IJ SOLN
2.0000 mL | INTRAMUSCULAR | Status: AC | PRN
Start: 2023-03-31 — End: 2023-03-31
  Administered 2023-03-31: 2 mL

## 2023-03-31 MED ORDER — TRIAMCINOLONE ACETONIDE 40 MG/ML IJ SUSP
40.0000 mg | INTRAMUSCULAR | Status: AC | PRN
Start: 2023-03-31 — End: 2023-03-31
  Administered 2023-03-31: 40 mg via INTRA_ARTICULAR

## 2023-03-31 NOTE — Progress Notes (Signed)
Office Visit Note  Patient: Noah Grant             Date of Birth: 1951-10-13           MRN: [id number]             PCP: Sharlene Dory, DO Referring: Sharlene Dory* Visit Date: 03/31/2023   Subjective:  Follow-up (Patient states he would like to talk about neuropathy. )   History of Present Illness: Noah Grant is a 72 y.o. 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 daily we switch medications after last visit.  So far has not seen any significant difference in joint symptoms for better or worse.  He is tolerating the medication without noticeable side effect.  He had follow-up with his PCP office and discussed bilateral foot pain that was apparently unclear if related to peripheral neuropathy or from his rheumatoid arthritis.  Pain is mostly in the anterior half of the foot more severe along the top and across MTPs particularly bad early in the day when weightbearing or with direct pressure.  His mobility is also limited with bilateral hip pain.  Hurts worse on the side of the hips gets much worse after prolonged walking also gets pain if lying on either side in bed from direct pressure.  Previous HPI 01/29/23 Noah Grant is a 72 y.o. male here for follow up for seropositive RA on Actemra 162 mg subcu q. 14 days.  Overall he is feels very poorly like his arthritis is not benefiting at all from the treatment and mobility is worse than a few months ago.  He stopped some cardiac medications with improvement in his dizziness and leg swelling and fatigue.  Recent 2D echo look normal with no evidence of effusion or reduced ejection fraction from previous myocarditis episode.  He has ongoing bilateral shoulder pain with stiffness that remains throughout the entire day.  He is using a cane for offloading due to pain affecting hips and feet on both sides worse while walking.  He is seeing some diffuse swelling throughout the hands this appears  worse at the end of the day does not localize to any particular joints.  Does cause difficulty fully closing his grip.   Previous HPI 10/29/22 Noah Grant is a 72 y.o. 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  He completed the colchicine and is now on just the Actemra treatment.  He is feeling very fatigued limiting his overall activity level.  He is also been experiencing some additional pain and stiffness in multiple areas.  He had increased swelling in both legs accumulating towards the feet and ankles.  He has had adjustment to beta blocker treatment currently prescribed metoprolol XR 25 mg daily. Bradycardia and heart rate improved now often in the 50s did not notice any difference in the peripheral swelling after the medicine change. He is also still on norvasc and hydralazine for blood pressure control.   Previous HPI 05/30/22 Noah Grant is a 72 y.o. male here for follow up for RA he was on leflunomide recently hospitalized with sepsis and pericarditis without clear underlying infection identified.  Symptoms started with chills developed increasing swelling in his hands and feet with joint pain that was worse in the shoulders and hips.  He developed severe chest pain felt a constriction or tightness around the heart.  The chest pain was  sharp did report provocation with deep breaths or lying in left or right positions.  Evaluation was consistent with pericarditis with effusion.  He was discharged on colchicine and received short term steroids and the chest pain is currently doing well.  The flareup of joint pain has also improved with the oral anti-inflammatory medication.   Previous HPI 04/19/22 Noah Grant is a 72 y.o. male here for rheumatoid arthritis previously seeing Dr. Sharmon Revere and on treatment with leflunomide.  Symptoms started a long time ago gradual onset but has been seeing trucks for treatment of this in the  past decade or so.  He has joint pain and stiffness involving multiple areas.  Shoulders, wrists, hands, also bilateral hip pains.  Most commonly sees visible swelling or inflammation in his hands.  He has morning stiffness very severe for 30 minutes to an hour but keeps persistent joint stiffness throughout the day.  Also has pain increased when trying to lie still at night.  He has tried a number of treatments for this over multiple years.  He did not tolerate hydroxychloroquine due to confusion, MTX not tolerated, leflunomide leukopenia, and sulfasalazine mood disturbances.  He never experienced a significant improvement with Humira injection and developed acute conjunctivitis shortly after starting Remicade infusion.   DMARD Hx LEF current MTX GI intolerance HCQ Psychiatric side effects SSZ Psychiatric side effects Humira nonresponder Remicade nonresponder and conjunctivitis   12/2021 Cholesterol 107 TGs 193 HDL 28.9   09/2021 HBV neg HCV neg   Review of Systems  Constitutional:  Positive for fatigue.  HENT:  Negative for mouth sores and mouth dryness.   Eyes:  Positive for dryness.  Respiratory:  Positive for shortness of breath.   Cardiovascular:  Positive for chest pain and palpitations.  Gastrointestinal:  Negative for blood in stool, constipation and diarrhea.  Endocrine: Positive for increased urination.  Genitourinary:  Negative for involuntary urination.  Musculoskeletal:  Positive for joint pain, gait problem, joint pain, joint swelling, myalgias, muscle weakness, morning stiffness, muscle tenderness and myalgias.  Skin:  Negative for color change, rash, hair loss and sensitivity to sunlight.  Allergic/Immunologic: Negative for susceptible to infections.  Neurological:  Positive for headaches. Negative for dizziness.  Hematological:  Negative for swollen glands.  Psychiatric/Behavioral:  Positive for sleep disturbance. Negative for depressed mood. The patient is  nervous/anxious.     PMFS History:  Patient Active Problem List   Diagnosis Date Noted   Anemia 09/02/2022   Atrial fibrillation 09/02/2022   Stasis dermatitis of both legs 07/29/2022   Myocarditis 05/14/2022   Pericarditis 05/12/2022   Atrial fibrillation with rapid ventricular response 05/12/2022   Sepsis 05/11/2022   Elevated troponin level not due myocardial infarction 05/11/2022   Lactic acidosis 05/11/2022   Drug therapy 05/18/2021   Hypertension    Normocytic anemia    Arthritis    Bipolar 1 disorder    Chicken pox    Chronic bronchitis    Depression    Hyperlipidemia    Rheumatic fever    Essential hypertension    Coronary artery disease involving native coronary artery of native heart 09/06/2020   Chest pain 09/06/2020   Coronary artery disease involving native coronary artery of native heart with angina pectoris 09/06/2020   Angina pectoris 08/30/2020   Cardiac murmur 08/30/2020   Statin intolerance 07/31/2020   Abdominal aortic atherosclerosis    Ingrowing nail 03/12/2020   Bipolar affective disorder, currently depressed, moderate 03/08/2020   Brain ischemia 03/08/2020   Cognitive  complaints 03/08/2020   History of stroke 11/10/2019   Chronic kidney disease, stage 3a (HCC) 07/21/2019   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   Thoracic radiculopathy 06/16/2019   Intention tremor 06/16/2019   Right-sided chest wall pain 04/17/2019   Barrett's esophagus without dysplasia 03/25/2019   History of colon polyps 03/25/2019   PAD (peripheral artery disease) 03/23/2019   Benign prostatic hyperplasia with nocturia 12/29/2018   GAD (generalized anxiety disorder) 11/02/2018   Rheumatoid arthritis 10/29/2018   Lichen planus 04/07/2018   High risk medication use 03/23/2018   Seasonal allergies 03/23/2018   Long-term use of immunosuppressant medication 03/23/2018   Low testosterone 09/17/2016   Vitamin B12 deficiency 09/17/2016   Vitamin D deficiency 09/17/2016    Disc degeneration, 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   Pain of right hip joint 01/19/2016   Herpes gingivostomatitis 02/10/2015   Midline thoracic back pain 02/10/2015   Normochromic normocytic anemia 08/16/2014   Bipolar 1 disorder, mixed, moderate (HCC) 08/16/2014   Mixed hyperlipidemia 08/16/2014   Labile hypertension 08/16/2014   Gastroesophageal reflux disease 08/16/2014   Bipolar depression 08/16/2014   GERD without esophagitis 08/16/2014    Past Medical History:  Diagnosis Date   Abdominal aortic atherosclerosis    Anemia    likely of chronic disease   Angina pectoris 08/30/2020   Arthritis    Atrial fibrillation    Atrial fibrillation with rapid ventricular response 05/12/2022   Barrett's esophagus without dysplasia 03/25/2019   Formatting of this note might be different from the original. EGD done in 02/2019. Due in 3 years.   Bipolar 1 disorder    Bipolar 1 disorder, mixed, moderate 08/16/2014   Bipolar affective disorder, currently depressed, moderate 03/08/2020   Bipolar depression 08/16/2014   BPH with urinary obstruction 12/29/2018   Brain ischemia 03/08/2020   Cardiac murmur 08/30/2020   Chest pain 09/06/2020   Chicken pox    Chronic bronchitis    Chronic kidney disease, stage 3a 07/21/2019   Cognitive complaints 03/08/2020   Coronary artery disease involving native coronary artery of native heart 09/06/2020   Coronary artery disease involving native coronary artery of native heart with angina pectoris 09/06/2020   Depression    Disc degeneration, 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 Last Assessment & Plan:  Well controlled. Continue current medications.   GERD without esophagitis 08/16/2014   Formatting of this note might be  different from the original. Last Assessment & Plan:  Well controlled. Continue current medications.   Herpes gingivostomatitis 02/10/2015   High risk medication use 03/23/2018   History of colon polyps 03/25/2019   Formatting of this note might be different from the original. tubular adenoma   History of stroke 11/10/2019   Hyperlipidemia    Hypertension    Ingrowing nail 03/12/2020   Intention tremor 06/16/2019   Labile hypertension 08/16/2014   Formatting of this note might be different from the original. Last Assessment & Plan:  Slightly above goal today secondary to pain. Will continue current regimen. Will check CMP today.   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 Concern over possible lichen planus. His dentist noticed lesions of his oral mucosa.  It  was felt to be lichen planus.  He presents to discuss further.  Rarely does the area hurt.He smoked in the distant past EXAM shows several white patches on the buccal mucosa bilaterally.  No other concerning oral mucosal les   Long-term use of immunosuppressant medication 03/23/2018   Low testosterone 09/17/2016   Midline thoracic back pain 02/10/2015   Mixed hyperlipidemia 08/16/2014   Myocarditis 05/14/2022   Normochromic normocytic anemia 08/16/2014   Normocytic anemia    likely of chronic disease   PAD (peripheral artery disease) 03/23/2019   Pain of right hip joint 01/19/2016   Pericarditis 05/12/2022   Rheumatic fever    Rheumatoid arthritis 10/29/2018   Formatting of this note might be different from the original. Rheum:  Dr. Sharmon Revere Formerly on MTX - stopped due to mouth sores. Currently started on Arava.  Also on 5 mg prednisone x 3 months during run-in.   Right-sided chest wall pain 04/17/2019   Seasonal allergies 03/23/2018   Sepsis 05/11/2022   Stasis dermatitis of both legs 07/29/2022   Statin intolerance 07/31/2020    Task-specific dystonia of hand    right hand   Thoracic radiculopathy 06/16/2019   Vitamin B12 deficiency    Injections   Vitamin D deficiency 09/17/2016    Family History  Problem Relation Age of Onset   Alzheimer's disease Mother 28       Deceased in early 59s   Stroke Father 30       Deceased   Hypertension Father    Alcoholism Father    Alcoholism Brother    Heart disease Maternal Grandfather 23       Deceased   Heart disease Paternal Grandfather 41       Deceased   Hypertension Son    Past Surgical History:  Procedure Laterality Date   CLAVICLE SURGERY     Right, Hardware placed   CORONARY STENT INTERVENTION N/A 09/06/2020   Procedure: CORONARY STENT INTERVENTION;  Surgeon: Tonny Bollman, MD;  Location: [facility] INVASIVE CV LAB;  Service: Cardiovascular;  Laterality: N/A;   ingrown toenail Bilateral    great toes   LEFT HEART CATH AND CORONARY ANGIOGRAPHY N/A 09/06/2020   Procedure: LEFT HEART CATH AND CORONARY ANGIOGRAPHY;  Surgeon: Tonny Bollman, MD;  Location: [facility] INVASIVE CV LAB;  Service: Cardiovascular;  Laterality: N/A;   WISDOM TOOTH EXTRACTION     Social History   Social History Narrative   Not on file   Immunization History  Administered Date(s) Administered   Fluad Quad(high Dose 65+) 09/03/2021, 09/04/2022   Influenza, High Dose Seasonal PF 09/10/2019, 09/03/2020   Influenza,inj,Quad PF,6+ Mos 08/22/2014, 10/04/2015   Moderna SARS-COV2 Booster Vaccination 10/31/2020, 07/04/2021   Moderna Sars-Covid-2 Vaccination 01/17/2020, 02/13/2020   PNEUMOCOCCAL CONJUGATE-20 01/02/2022   Zoster Recombinat (Shingrix) 07/02/2017, 09/02/2017   Zoster, Live 08/17/2011     Objective: Vital Signs: BP (!) 151/66 (BP Location: Left Arm, Patient Position: Sitting, Cuff Size: Normal)   Pulse (!) 54   Resp 12   Ht 5\' 7"  (1.702 m)   Wt 168 lb (76.2 kg)   BMI 26.31 kg/m    Physical Exam Cardiovascular:     Rate and Rhythm: Normal rate and regular rhythm.  Pulmonary:      Effort: Pulmonary effort is normal.     Breath sounds: Normal breath sounds.  Musculoskeletal:     Comments: Pedal edema right foot and ankle  Skin:    General: Skin is warm and dry.  Neurological:  General: No focal deficit present.     Mental Status: He is alert.  Psychiatric:        Mood and Affect: Mood normal.      Musculoskeletal Exam:  Shoulder pain with palpation or with active range of motion very limited in overhead abduction, no palpable swelling Elbows full ROM no tenderness or swelling Wrists full ROM no tenderness or swelling Chronic MCP joint thickening bilaterally, tenderness to pressure more severe on right hand second through fourth MCPs, no definite synovitis, no lateral deviation Lateral hip pain provoked on either side with internal and external rotation or with direct pressure localized at greater trochanter Knees full ROM no tenderness or swelling MTP squeeze tenderness and tenderness to pressure along the dorsal side of second third and fourth MTPs, no palpable swelling   CDAI Exam: CDAI Score: 12  Patient Global: 50 mm; Provider Global: 20 mm Swollen: 0 ; Tender: 7  Joint Exam 03/31/2023      Right  Left  Glenohumeral   Tender   Tender  MCP 2   Tender     MCP 3   Tender     MCP 4   Tender     Hip   Tender   Tender       Investigation: No additional findings.  Imaging: No results found.  Recent Labs: Lab Results  Component Value Date   WBC 5.5 01/29/2023   HGB 12.1 (L) 01/29/2023   PLT 194 01/29/2023   NA 138 01/29/2023   K 4.9 01/29/2023   CL 106 01/29/2023   CO2 26 01/29/2023   GLUCOSE 83 01/29/2023   BUN 29 (H) 01/29/2023   CREATININE 1.66 (H) 01/29/2023   BILITOT 0.5 01/29/2023   ALKPHOS 44 05/12/2022   AST 16 01/29/2023   ALT 19 01/29/2023   PROT 6.5 01/29/2023   ALBUMIN 2.8 (L) 05/12/2022   CALCIUM 9.8 01/29/2023   GFRAA 75 11/28/2020   QFTBGOLDPLUS NEGATIVE 04/19/2022    Speciality Comments: No specialty  comments available.  Procedures:  Large Joint Inj: R greater trochanter on 03/31/2023 12:00 PM Indications: pain Details: 27 G 1.5 in needle, lateral approach Medications: 2 mL lidocaine 1 %; 40 mg triamcinolone acetonide 40 MG/ML Outcome: tolerated well, no immediate complications Procedure, treatment alternatives, risks and benefits explained, specific risks discussed. Consent was given by the patient. Immediately prior to procedure a time out was called to verify the correct patient, procedure, equipment, support staff and site/side marked as required. Patient was prepped and draped in the usual sterile fashion.     Allergies: Methotrexate, Nsaids, Plaquenil [hydroxychloroquine], Azulfidine [sulfasalazine], Crestor [rosuvastatin], Isosorbide, Mevacor [lovastatin], and Pravachol [pravastatin]   Assessment / Plan:     Visit Diagnoses: Rheumatoid arthritis involving multiple sites, unspecified whether rheumatoid factor present - Plan: Sedimentation rate, Large Joint Inj: R greater trochanter  Seropositive RA a lot of ongoing joint pain complaint though minimal peripheral synovitis appreciable on exam.  Checking sed rate for disease activity monitoring since switching treatment.  Bilateral hip pain suggestive for trochanteric bursitis or gluteal tendinopathy discussed options of physical therapy oral medications or local injection today.  I am not sure about his complaint for bilateral foot pain.  There is no appreciable synovitis but distribution is also atypical for peripheral neuropathy.  If related to RA would be treated under current plan already and he is not interested in resuming any neuropathy medication such as gabapentin due to excessive sedation on these in the past. Will try right  hip steroid injection for greater trochanter if this greatly benefit symptoms could repeat on left side.  Plan to continue leflunomide 20 mg p.o. daily.  Acute pericarditis, unspecified type  No recurrence  of chest pain no respiratory symptoms.  High risk medication use - Plan: CBC with Differential/Platelet, COMPLETE METABOLIC PANEL WITH GFR  Checking CBC and CMP for medication monitoring now on leflunomide 20 mg p.o. daily.  Not reporting any particular medication intolerance or side effect.  Can be associated with small increase in blood pressure but has multiple other causes as well.  Orders: Orders Placed This Encounter  Procedures   Large Joint Inj: R greater trochanter   Sedimentation rate   CBC with Differential/Platelet   COMPLETE METABOLIC PANEL WITH GFR   No orders of the defined types were placed in this encounter.    Follow-Up Instructions: Return in about 3 months (around 06/30/2023) for RA on LEF/hip inj f/u 3mos.   Fuller Plan, MD  Note - This record has been created using [employer].  Chart creation errors have been sought, but may not always  have been located. Such creation errors do not reflect on  the standard of medical care.

## 2023-04-01 LAB — COMPLETE METABOLIC PANEL WITH GFR
AG Ratio: 2 (calc) (ref 1.0–2.5)
ALT: 18 U/L (ref 9–46)
AST: 17 U/L (ref 10–35)
Albumin: 4.4 g/dL (ref 3.6–5.1)
Alkaline phosphatase (APISO): 65 U/L (ref 35–144)
BUN/Creatinine Ratio: 15 (calc) (ref 6–22)
BUN: 20 mg/dL (ref 7–25)
CO2: 27 mmol/L (ref 20–32)
Calcium: 9.4 mg/dL (ref 8.6–10.3)
Chloride: 107 mmol/L (ref 98–110)
Creat: 1.36 mg/dL — ABNORMAL HIGH (ref 0.70–1.28)
Globulin: 2.2 g/dL (calc) (ref 1.9–3.7)
Glucose, Bld: 85 mg/dL (ref 65–99)
Potassium: 4.4 mmol/L (ref 3.5–5.3)
Sodium: 141 mmol/L (ref 135–146)
Total Bilirubin: 0.3 mg/dL (ref 0.2–1.2)
Total Protein: 6.6 g/dL (ref 6.1–8.1)
eGFR: 56 mL/min/{1.73_m2} — ABNORMAL LOW (ref >=60)

## 2023-04-01 LAB — CBC WITH DIFFERENTIAL/PLATELET
Absolute Monocytes: 617 cells/uL (ref 200–950)
Basophils Absolute: 49 cells/uL (ref 0–200)
Basophils Relative: 1 %
Eosinophils Absolute: 201 cells/uL (ref 15–500)
Eosinophils Relative: 4.1 %
HCT: 33.3 % — ABNORMAL LOW (ref 38.5–50.0)
Hemoglobin: 11.1 g/dL — ABNORMAL LOW (ref 13.2–17.1)
Lymphs Abs: 568 cells/uL — ABNORMAL LOW (ref 850–3900)
MCHC: 33.3 g/dL (ref 32.0–36.0)
MCV: 91.7 fL (ref 80.0–100.0)
MPV: 12.4 fL (ref 7.5–12.5)
Monocytes Relative: 12.6 %
Neutro Abs: 3464 cells/uL (ref 1500–7800)
Neutrophils Relative %: 70.7 %
Platelets: 188 10*3/uL (ref 140–400)
Total Lymphocyte: 11.6 %
WBC: 4.9 10*3/uL (ref 3.8–10.8)

## 2023-04-01 LAB — SEDIMENTATION RATE: Sed Rate: 6 mm/h (ref 0–20)

## 2023-04-04 ENCOUNTER — Other Ambulatory Visit: Payer: Self-pay | Admitting: Cardiology

## 2023-04-07 ENCOUNTER — Other Ambulatory Visit: Payer: Self-pay | Admitting: Family Medicine

## 2023-04-07 ENCOUNTER — Encounter: Payer: Self-pay | Admitting: Family Medicine

## 2023-04-07 DIAGNOSIS — G629 Polyneuropathy, unspecified: Secondary | ICD-10-CM

## 2023-04-20 ENCOUNTER — Other Ambulatory Visit: Payer: Self-pay | Admitting: Internal Medicine

## 2023-04-20 DIAGNOSIS — M069 Rheumatoid arthritis, unspecified: Secondary | ICD-10-CM

## 2023-04-21 NOTE — Telephone Encounter (Signed)
Last Fill: 01/29/2023  Labs: 03/31/2023  RBC 3.63 Hemoglobin 11.1 HCT 33.3 Lymphs Abs 568 Creat 1.36 eGFR 56  Next Visit: 06/30/2023  Last Visit: 03/31/2023  DX: Rheumatoid arthritis involving multiple sites, unspecified whether rheumatoid factor present   Current Dose per office note 03/31/2023: leflunomide 20 mg p.o. daily.   Okay to refill Arava ?

## 2023-04-30 ENCOUNTER — Other Ambulatory Visit: Payer: Self-pay | Admitting: Psychiatry

## 2023-04-30 DIAGNOSIS — F3132 Bipolar disorder, current episode depressed, moderate: Secondary | ICD-10-CM

## 2023-05-07 ENCOUNTER — Encounter: Payer: Self-pay | Admitting: Psychiatry

## 2023-05-07 ENCOUNTER — Ambulatory Visit: Payer: PPO | Admitting: Psychiatry

## 2023-05-07 DIAGNOSIS — R251 Tremor, unspecified: Secondary | ICD-10-CM

## 2023-05-07 DIAGNOSIS — F411 Generalized anxiety disorder: Secondary | ICD-10-CM | POA: Diagnosis not present

## 2023-05-07 DIAGNOSIS — Z79899 Other long term (current) drug therapy: Secondary | ICD-10-CM | POA: Diagnosis not present

## 2023-05-07 DIAGNOSIS — F3132 Bipolar disorder, current episode depressed, moderate: Secondary | ICD-10-CM

## 2023-05-07 MED ORDER — LITHIUM CARBONATE 150 MG PO CAPS: 450.0000 mg | ORAL_CAPSULE | Freq: Every day | ORAL | 1 refills | Status: DC

## 2023-05-07 NOTE — Progress Notes (Signed)
Noah Grant [id number] [birth year] 72 y.o.  Subjective:   Patient ID:  Noah Grant is a 72 y.o. (DOB [birth year]) male.  Chief Complaint:  Chief Complaint  Patient presents with   Follow-up    Mood and health issues    Depression        Associated symptoms include fatigue.  Associated symptoms include no decreased concentration and no suicidal ideas.  Past medical history includes anxiety.   Anxiety Symptoms include nervous/anxious behavior. Patient reports no chest pain, confusion, decreased concentration or suicidal ideas.    Medication Refill Associated symptoms include arthralgias and fatigue. Pertinent negatives include no chest pain or weakness.   Noah Grant presents to the office today for follow-up of bipolar, GAD, poor STM.  seen May 18, 2019 and 11/17/2019 without med changes.    05/17/2020 appointment with the following noted: Word recall problems with neuropsych testing by Dr. Orie Fisherman inconclusive results.  Test reviewed. Still tremor can interfere with eating. Nocturia Dt overactive bladder. Overall mentally ok but too many medical concerns with RA and muscle pain.   Chronic pain issues ongoing.   Htn controlled.  Plan: Lithium level from January 2021 0.8 on 600 mg daily. Gradually it's crept up.  Disc in detail it's relation to renal function and Cr has increased gradually. BC tremor & jerks reduce to 450 mg daily.  02/12/2021 appointment with the following noted: Reduced ithium with no change in tremor. End of December, CVD stent placement and reaction to contrast dye.   Cardiologists ask if he can come off CBZ.   DT DDI. In the last year has felet mentally better than ever.   Plan: Reduce carbamazepine by 1/2 tablet every week until off of it.  04/09/2021 appointment with the following noted: Off CBZ and didn't have any problems.  No worse for mood or anxiety.  Never slept good in life.  Melatonin caused dizziness. Checked lithium level. 06  No  change in tremors in a long time.  Maninly noticeable with fine motor control. Depression is somewhat chronic and waxes and wanes dependent on health issues.  Avoids stressors if possible.  Plan no med changes  10/10/21 appt noted: No Covid.  Hx TIA's.  Tremor seems a little worse but also has RA and started infusions last week. Continues lithium 450 mg HS as only psych med. Patient reports stable mood and denie irritable moods except as noted.  Patient denies any recent difficulty with anxiety except situational.  Patient denies unusual difficulty with sleep initiation or maintenance except awakens with pain chronically. Denies appetite disturbance.  Patient reports that energy and motivation have been good.  Patient denies any difficulty with concentration  But history of word finding from ministrokes..  Patient denies any suicidal ideation.  04/09/22 appt noted: Doing well overall.  RA is a lot worse and changing rheumatologist.  Is tired and anemic with the meds. Mood overall is stable and pleased.  Deaf in left ear.  Tried hearing aids and overall minimal improvement.   Continues  lithium 450 mg HS as only psych med. Tremor is some worse. History TIA's or ministrokes may be contributing.  10/10/22 appt noted: Continues  lithium 450 mg HS as only psych med. 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 Wife survived breast CA. Decline in kidney function.  Pending FU in November. Mood has been ok.  Anxiety is manageable. Tremor is worse and worse as day progresses  03/04/23 appt noted: Continues  lithium  450 mg HS as only psych med. On Xarelto but wants to get off it. Nephro notes Cr 1.4 up to 1.8 07/2022; last 01/29/23 =1.66.  Rec he disc alternative to lithium.   Patient reports stable mood and denies depressed or irritable moods.  Patient denies any recent difficulty with anxiety.  Patient denies difficulty with sleep initiation or maintenance. Denies appetite  disturbance.  Patient reports that energy and motivation have been good.  Patient denies any difficulty with concentration.  Patient denies any suicidal ideation. Card Revankar, [place] Plan: Continue lithium 450 mg daily but tremor did not get better with reduction last visit. Lithium level 10/18/21 = 0.6, 05/12/22 lithium =0.45 Disc alternative Trileptal but DDI   05/07/23 appt noted: Psych med:  lithium 450 mg HS as only psych med. Last Cr better at 1.36. Disc pros and cons of changing lithium and risk of relapse.   Mood is OK and without much change. Has RA and waxes and wanes and causes fatigue. Won't be given oral steroids.   Still has tremor but better than it was.    Has seen nephorologists and rheumatologists and neurologist, [facility].   Lives in [place] now  Previous psych med trials are extensive and include meds for anxiety and mood.  These include  clonidine, buspirone,  Abilify 5 mg,  olanzapine, Geodon, Depakote SE, CBZ,  gabapentin sedation , lamotrigine, bupropion, Strattera,  Prozac, Serzone 700 mg daily, sertraline, duloxetine,  Pristiq which caused rage, mirtazapine, Pamelor, amitriptyline 450 mg/day, Ritalin, Provigil,  temazepam,  Ambien, ProSom, trazodone which was ineffective.  Cerefolin NAC,  Thinks he tried propranolol which caused back pain  Review of Systems:  Review of Systems  Constitutional:  Positive for fatigue.  Cardiovascular:  Negative for chest pain.  Musculoskeletal:  Positive for arthralgias and gait problem.  Neurological:  Positive for tremors. Negative for weakness.       Occ jerks  Psychiatric/Behavioral:  Negative for agitation, behavioral problems, confusion, decreased concentration, dysphoric mood, hallucinations, self-injury, sleep disturbance and suicidal ideas. The patient is nervous/anxious. The patient is not hyperactive.     Medications: I have reviewed the patient's current medications.  Current Outpatient  Medications  Medication Sig Dispense Refill   amLODipine (NORVASC) 5 MG tablet Take 2 tablets (10 mg total) by mouth daily. 30 tablet 3   Cholecalciferol 50 MCG (2000 UT) CAPS Take 2,000 Units by mouth daily.      Coenzyme Q10 (COQ10) 200 MG CAPS Take 200 mg by mouth daily.     diclofenac Sodium (VOLTAREN) 1 % GEL Apply 1 application  topically daily as needed for pain.     ELIQUIS 5 MG TABS tablet TAKE 1 TABLET BY MOUTH TWICE A DAY 60 tablet 3   ezetimibe (ZETIA) 10 MG tablet Take 1 tablet (10 mg total) by mouth daily. 90 tablet 2   finasteride (PROSCAR) 5 MG tablet TAKE 1 TABLET BY MOUTH DAILY 90 tablet 1   hydrALAZINE (APRESOLINE) 50 MG tablet Take 50 mg by mouth 3 (three) times daily.     leflunomide (ARAVA) 20 MG tablet TAKE 1 TABLET BY MOUTH DAILY 90 tablet 0   losartan (COZAAR) 100 MG tablet Take 100 mg by mouth daily.     Lysine HCl 1000 MG TABS Take 1,000 mg by mouth 2 (two) times daily.      nitroGLYCERIN (NITROSTAT) 0.4 MG SL tablet Place 1 tablet (0.4 mg total) under the tongue every 5 (five) minutes as needed for chest  pain. 25 tablet 4   omeprazole (PRILOSEC) 40 MG capsule Take 1 capsule (40 mg total) by mouth in the morning and at bedtime. 180 capsule 3   simvastatin (ZOCOR) 5 MG tablet TAKE ONE TABLET BY MOUTH DAILY 90 tablet 2   vitamin B-12 (CYANOCOBALAMIN) 1000 MCG tablet Take 1,000 mcg by mouth daily.     lithium carbonate 150 MG capsule Take 3 capsules (450 mg total) by mouth daily. 270 capsule 1   No current facility-administered medications for this visit.    Medication Side Effects: None  Allergies:  Allergies  Allergen Reactions   Methotrexate Dermatitis    Developed Mouth Sores   Nsaids Dermatitis    Developed Mouth Blisters   Plaquenil [Hydroxychloroquine] Other (See Comments)    Terrible Nightmares   Azulfidine [Sulfasalazine] Nausea Only   Crestor [Rosuvastatin] Other (See Comments)    Caused abdominal pain that radiated to the back   Isosorbide Other  (See Comments)    Made him feel weak   Mevacor [Lovastatin] Other (See Comments)    Caused abdominal pain that radiated to the back   Pravachol [Pravastatin] Other (See Comments)    Caused abdominal pain that radiated to the back    Past Medical History:  Diagnosis Date   Abdominal aortic atherosclerosis (HCC)    Anemia    likely of chronic disease   Angina pectoris (HCC) 08/30/2020   Arthritis    Atrial fibrillation (HCC)    Atrial fibrillation with rapid ventricular response (HCC) 05/12/2022   Barrett's esophagus without dysplasia 03/25/2019   Formatting of this note might be different from the original. EGD done in 02/2019. Due in 3 years.   Bipolar 1 disorder (HCC)    Bipolar 1 disorder, mixed, moderate (HCC) 08/16/2014   Bipolar affective disorder, currently depressed, moderate (HCC) 03/08/2020   Bipolar depression (HCC) 08/16/2014   BPH with urinary obstruction 12/29/2018   Brain ischemia 03/08/2020   Cardiac murmur 08/30/2020   Chest pain 09/06/2020   Chicken pox    Chronic bronchitis (HCC)    Chronic kidney disease, stage 3a (HCC) 07/21/2019   Cognitive complaints 03/08/2020   Coronary artery disease involving native coronary artery of native heart 09/06/2020   Coronary artery disease involving native coronary artery of native heart with angina pectoris (HCC) 09/06/2020   Depression    Disc degeneration, 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 Last Assessment & Plan:  Well controlled. Continue current medications.   GERD without esophagitis 08/16/2014   Formatting of this note might be different from the original. Last Assessment & Plan:  Well controlled. Continue current medications.   Herpes gingivostomatitis 02/10/2015    High risk medication use 03/23/2018   History of colon polyps 03/25/2019   Formatting of this note might be different from the original. tubular adenoma   History of stroke 11/10/2019   Hyperlipidemia    Hypertension    Ingrowing nail 03/12/2020   Intention tremor 06/16/2019   Labile hypertension 08/16/2014   Formatting of this note might be different from the original. Last Assessment & Plan:  Slightly above goal today secondary to pain. Will continue current regimen. Will check CMP today.   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 Concern over possible lichen planus. His dentist noticed lesions of his oral mucosa.  It was felt to be lichen planus.  He presents to discuss further.  Rarely does the area hurt.He smoked in the distant past EXAM shows several white patches on the buccal mucosa bilaterally.  No other concerning oral mucosal les   Long-term use of immunosuppressant medication 03/23/2018   Low testosterone 09/17/2016   Midline thoracic back pain 02/10/2015   Mixed hyperlipidemia 08/16/2014   Myocarditis (HCC) 05/14/2022   Normochromic normocytic anemia 08/16/2014   Normocytic anemia    likely of chronic disease   PAD (peripheral artery disease) (HCC) 03/23/2019   Pain of right hip joint 01/19/2016   Pericarditis 05/12/2022   Rheumatic fever    Rheumatoid arthritis (HCC) 10/29/2018   Formatting of this note might be different from the original. Rheum:  Dr. Sharmon Revere Formerly on MTX - stopped due to mouth sores. Currently started on Arava.  Also on 5 mg prednisone x 3 months during run-in.   Right-sided chest wall pain 04/17/2019   Seasonal allergies 03/23/2018   Sepsis (HCC) 05/11/2022   Stasis dermatitis of both legs 07/29/2022   Statin intolerance 07/31/2020   Task-specific dystonia of hand    right hand   Thoracic radiculopathy 06/16/2019   Vitamin B12 deficiency    Injections    Vitamin D deficiency 09/17/2016    Family History  Problem Relation Age of Onset   Alzheimer's disease Mother 67       Deceased in early 55s   Stroke Father 84       Deceased   Hypertension Father    Alcoholism Father    Alcoholism Brother    Heart disease Maternal Grandfather 6       Deceased   Heart disease Paternal Grandfather 34       Deceased   Hypertension Son      Past Medical History, Surgical history, Social history, and Family history were reviewed and updated as appropriate.   Please see review of systems for further details on the patient's review from today.   Objective:   Physical Exam:  There were no vitals taken for this visit.  Physical Exam Constitutional:      General: He is not in acute distress.    Appearance: He is well-developed.  Musculoskeletal:        General: No deformity.  Neurological:     Mental Status: He is alert and oriented to person, place, and time.     Coordination: Coordination normal.  Psychiatric:        Attention and Perception: Attention normal. He is attentive.        Mood and Affect: Mood normal. Mood is not anxious or depressed. Affect is not labile, blunt, angry or inappropriate.        Speech: Speech normal.        Behavior: Behavior normal.        Thought Content: Thought content normal. Thought content is not delusional. Thought content does not include homicidal or suicidal ideation. Thought content does not include suicidal plan.        Cognition and Memory: Cognition normal.        Judgment: Judgment normal.     Comments: Insight is good. Good humor.     Lab Review:     Component Value Date/Time   NA 141 03/31/2023 1200   NA 141 11/28/2020 0841   K  4.4 03/31/2023 1200   CL 107 03/31/2023 1200   CO2 27 03/31/2023 1200   GLUCOSE 85 03/31/2023 1200   BUN 20 03/31/2023 1200   BUN 17 11/28/2020 0841   CREATININE 1.36 (H) 03/31/2023 1200   CALCIUM 9.4 03/31/2023 1200   PROT 6.6 03/31/2023 1200   PROT  5.9 (L) 11/28/2020 0841   ALBUMIN 2.8 (L) 05/12/2022 0201   ALBUMIN 4.2 11/28/2020 0841   AST 17 03/31/2023 1200   ALT 18 03/31/2023 1200   ALKPHOS 44 05/12/2022 0201   BILITOT 0.3 03/31/2023 1200   BILITOT 0.3 11/28/2020 0841   GFRNONAA 59 (L) 05/15/2022 0306   GFRAA 75 11/28/2020 0841       Component Value Date/Time   WBC 4.9 03/31/2023 1200   RBC 3.63 (L) 03/31/2023 1200   HGB 11.1 (L) 03/31/2023 1200   HGB 10.8 (L) 08/30/2020 1139   HCT 33.3 (L) 03/31/2023 1200   HCT 32.4 (L) 08/30/2020 1139   PLT 188 03/31/2023 1200   PLT 212 08/30/2020 1139   MCV 91.7 03/31/2023 1200   MCV 92 08/30/2020 1139   MCH 30.6 03/31/2023 1200   MCHC 33.3 03/31/2023 1200   RDW 11.6 03/31/2023 1200   RDW 12.0 08/30/2020 1139   LYMPHSABS 568 (L) 03/31/2023 1200   LYMPHSABS 0.5 (L) 08/30/2020 1139   MONOABS 1.3 (H) 05/12/2022 0201   EOSABS 201 03/31/2023 1200   EOSABS 0.3 08/30/2020 1139   BASOSABS 49 03/31/2023 1200   BASOSABS 0.1 08/30/2020 1139    Lithium Lvl  Date Value Ref Range Status  05/12/2022 0.45 (L) 0.60 - 1.20 mmol/L Final    Comment:    Performed at [facility], [address] [place], [place] [address]     No results found for: "PHENYTOIN", "PHENOBARB", "VALPROATE", "CBMZ"   .res Assessment: Plan:    Bipolar disorder with moderate depression (HCC) - Plan: Lithium level, lithium carbonate 150 MG capsule  Generalized anxiety disorder  Lithium use  Tremor   He has failed multiple other psychiatric medications and does not wish to have further med changes today. Only current psych med is lithium 450 mg daily .  And his mood is good and stable.  Lithium level from January 2021 0.8 on 600 mg daily. Gradually it's crept up.  Disc in detail it's relation to renal function and Cr has increased gradually. CR 1.35 01/18/21  Creatinine 1.75 07/2022, 01/2023 CR 1.66, Cr 03/2023 = 1.36.  Continue lithium 450 mg daily but tremor did not get better with reduction last  visit. Lithium level 10/18/21 = 0.6, 05/12/22 lithium =0.45 Check lithium level. Check BMP every 3 mos  Counseled patient regarding potential benefits, risks, and side effects of lithium to include potential risk of lithium affecting thyroid and renal function.  Discussed need for periodic lab monitoring to determine drug level and to assess for potential adverse effects.  Counseled patient regarding signs and symptoms of lithium toxicity and advised that they notify office immediately or seek urgent medical attention if experiencing these signs and symptoms.  Patient advised to contact office with any questions or concerns. Kidney function surpisingly looks better with last BMP.   He wants to continue lithium for now.  Disc best alternative mood stabilizer Trileptal but still some Ddi issues but less than CBZ He's failed multiple others.  3-4 mos  Meredith Staggers, MD, DFAPA   Please see After Visit Summary for patient specific instructions.  Future Appointments  Date Time Provider Department  Center  06/03/2023  9:20 AM Revankar, Aundra Dubin, MD [facility] None  06/30/2023 10:40 AM Dimple Casey, Jamesetta Orleans, MD [facility] None  09/22/2023 10:15 AM Carmelia Roller, Jilda Roche, DO [id number]    Orders Placed This Encounter  Procedures   Lithium level       -------------------------------

## 2023-05-22 ENCOUNTER — Other Ambulatory Visit: Payer: Self-pay

## 2023-05-22 DIAGNOSIS — F3132 Bipolar disorder, current episode depressed, moderate: Secondary | ICD-10-CM | POA: Diagnosis not present

## 2023-05-23 LAB — LITHIUM LEVEL: Lithium Lvl: 0.6 mmol/L (ref 0.6–1.2)

## 2023-06-03 ENCOUNTER — Encounter: Payer: Self-pay | Admitting: Cardiology

## 2023-06-03 ENCOUNTER — Ambulatory Visit: Payer: PPO | Attending: Cardiology | Admitting: Cardiology

## 2023-06-03 VITALS — BP 140/58 | HR 55 | Ht 67.0 in | Wt 161.0 lb

## 2023-06-03 DIAGNOSIS — N1831 Chronic kidney disease, stage 3a: Secondary | ICD-10-CM | POA: Diagnosis not present

## 2023-06-03 DIAGNOSIS — I10 Essential (primary) hypertension: Secondary | ICD-10-CM

## 2023-06-03 DIAGNOSIS — I700 Atherosclerosis of aorta: Secondary | ICD-10-CM | POA: Diagnosis not present

## 2023-06-03 DIAGNOSIS — I25119 Atherosclerotic heart disease of native coronary artery with unspecified angina pectoris: Secondary | ICD-10-CM

## 2023-06-03 DIAGNOSIS — I209 Angina pectoris, unspecified: Secondary | ICD-10-CM

## 2023-06-03 DIAGNOSIS — Z8673 Personal history of transient ischemic attack (TIA), and cerebral infarction without residual deficits: Secondary | ICD-10-CM | POA: Diagnosis not present

## 2023-06-03 MED ORDER — PREDNISONE 50 MG PO TABS: ORAL_TABLET | ORAL | 0 refills | Status: DC

## 2023-06-03 NOTE — H&P (View-Only) (Signed)
Cardiology Office Note:    Date:  06/03/2023   ID:  Noah Grant, DOB 05-29-1951, MRN [id number]  PCP:  Sharlene Dory, DO  Cardiologist:  Garwin Brothers, MD   Referring MD: Sharlene Dory*    ASSESSMENT:    1. Essential hypertension   2. Coronary artery disease involving native coronary artery of native heart with angina pectoris (HCC)   3. Abdominal aortic atherosclerosis (HCC)   4. History of stroke   5. Angina pectoris (HCC)   6. Chronic kidney disease, stage 3a (HCC)    PLAN:    In order of problems listed above:  Angina pectoris: Coronary artery disease:In view of the patient's symptoms, I discussed with the patient options for evaluation. Invasive and noninvasive options were given to the patient. I discussed stress testing and coronary angiography and left heart catheterization at length. Benefits, pros and cons of each approach were discussed at length. Patient had multiple questions which were answered to the patient's satisfaction. Patient opted for invasive evaluation and we will set up for coronary angiography and left heart catheterization. Further recommendations will be made based on the findings with coronary angiography. In the interim if the patient has any significant symptoms in hospital to the nearest emergency room. Patient has renal insufficiency and therefore there may not be a pressing reason for performing left ventriculography.  Will leave this to the discretion of the interventional cardiologist for final decisions.  Advised patient about the precautions to take for diarrhea and discussed this with the nurse also.  I told him to keep himself well-hydrated before and after the test for renal protection. Essential hypertension: Blood pressure stable and diet was emphasized. Mixed dyslipidemia: On lipid-lowering medications followed by primary care. Paroxysmal atrial fibrillation:I discussed with the patient atrial fibrillation, disease  process. Management and therapy including rate and rhythm control, anticoagulation benefits and potential risks were discussed extensively with the patient. Patient had multiple questions which were answered to patient's satisfaction. Patient will be seen in follow-up appointment in 6 months or earlier if the patient has any concerns.    Medication Adjustments/Labs and Tests Ordered: Current medicines are reviewed at length with the patient today.  Concerns regarding medicines are outlined above.  No orders of the defined types were placed in this encounter.  No orders of the defined types were placed in this encounter.    No chief complaint on file.    History of Present Illness:    Noah Grant is a 72 y.o. male.  Patient has past medical history of coronary artery disease post stenting, paroxysmal atrial fibrillation, myocarditis, mixed dyslipidemia.  He mentions to me that he is having dyspnea on exertion and chest pain.  He does not use nitroglycerin.  No orthopnea or PND.  The symptoms are concerning to him.  Patient has history of renal insufficiency.  At the time of my evaluation, the patient is alert awake oriented and in no distress.  He gives a history of dye allergy the last time he underwent coronary angiography.  At the time of my evaluation, the patient is alert awake oriented and in no distress.  Past Medical History:  Diagnosis Date   Abdominal aortic atherosclerosis (HCC)    Anemia    likely of chronic disease   Angina pectoris (HCC) 08/30/2020   Arthritis    Atrial fibrillation (HCC)    Atrial fibrillation with rapid ventricular response (HCC) 05/12/2022   Barrett's esophagus without dysplasia 03/25/2019  Formatting of this note might be different from the original. EGD done in 02/2019. Due in 3 years.   Benign prostatic hyperplasia with nocturia 12/29/2018   Bipolar 1 disorder (HCC)    Bipolar 1 disorder, mixed, moderate (HCC) 08/16/2014   Bipolar 2 disorder  (HCC) 08/16/2014   Bipolar affective disorder, currently depressed, moderate (HCC) 03/08/2020   Bipolar depression (HCC) 08/16/2014   BPH with urinary obstruction 12/29/2018   Brain ischemia 03/08/2020   Cardiac murmur 08/30/2020   Chest pain 09/06/2020   Chicken pox    Chronic bronchitis (HCC)    Chronic kidney disease, stage 3a (HCC) 07/21/2019   Cognitive complaints 03/08/2020   Coronary artery disease involving native coronary artery of native heart 09/06/2020   Coronary artery disease involving native coronary artery of native heart with angina pectoris (HCC) 09/06/2020   Depression    Disc degeneration, 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 Last Assessment & Plan:  Well controlled. Continue current medications.   GERD without esophagitis 08/16/2014   Formatting of this note might be different from the original. Last Assessment & Plan:  Well controlled. Continue current medications.   Herpes gingivostomatitis 02/10/2015   High risk medication use 03/23/2018   History of colon polyps 03/25/2019   Formatting of this note might be different from the original. tubular adenoma   History of stroke 11/10/2019   Hyperlipidemia    Hyperphosphatemia 10/30/2022   Hypertension    Ingrowing nail 03/12/2020   Intention tremor 06/16/2019   Labile hypertension 08/16/2014   Formatting of this note might be different from the original. Last Assessment & Plan:  Slightly above goal today secondary to pain. Will continue current regimen. Will check CMP today.   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 Concern over possible lichen planus. His  dentist noticed lesions of his oral mucosa.  It was felt to be lichen planus.  He presents to discuss further.  Rarely does the area hurt.He smoked in the distant past EXAM shows several white patches on the buccal mucosa bilaterally.  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 Rheum:  Dr. Sharmon Revere Formerly on MTX - stopped due to mouth sores. Currently started on Arava.  Also on 5 mg prednisone x 3 months during run-in.   Right-sided chest wall pain 04/17/2019   Seasonal allergies 03/23/2018   Sepsis (HCC) 05/11/2022   Stasis dermatitis of both legs 07/29/2022   Statin intolerance 07/31/2020   Task-specific dystonia of hand    right hand   Thoracic radiculopathy 06/16/2019   Vitamin B12 deficiency    Injections   Vitamin D deficiency  09/17/2016    Past Surgical History:  Procedure Laterality Date   CLAVICLE SURGERY     Right, Hardware placed   CORONARY STENT INTERVENTION N/A 09/06/2020   Procedure: CORONARY STENT INTERVENTION;  Surgeon: Tonny Bollman, MD;  Location[facility] INVASIVE CV LAB;  Service: Cardiovascular;  Laterality: N/A;   ingrown toenail Bilateral    great toes   LEFT HEART CATH AND CORONARY ANGIOGRAPHY N/A 09/06/2020   Procedure: LEFT HEART CATH AND CORONARY ANGIOGRAPHY;  Surgeon: Tonny Bollman, MD;  Location: [facility] INVASIVE CV LAB;  Service: Cardiovascular;  Laterality: N/A;   WISDOM TOOTH EXTRACTION      Current  Medications: Current Meds  Medication Sig   amLODipine (NORVASC) 5 MG tablet Take 2 tablets (10 mg total) by mouth daily.   Cholecalciferol 50 MCG (2000 UT) CAPS Take 2,000 Units by mouth daily.    Coenzyme Q10 (COQ10) 200 MG CAPS Take 200 mg by mouth daily.   diclofenac Sodium (VOLTAREN) 1 % GEL Apply 1 application  topically daily as needed for pain.   ELIQUIS 5 MG TABS tablet TAKE 1 TABLET BY MOUTH TWICE A DAY   ezetimibe (ZETIA) 10 MG tablet Take 1 tablet (10 mg total) by mouth daily.   finasteride (PROSCAR) 5 MG tablet TAKE 1 TABLET BY MOUTH DAILY   hydrALAZINE (APRESOLINE) 50 MG tablet Take 50 mg by mouth 3 (three) times daily.   leflunomide (ARAVA) 20 MG tablet TAKE 1 TABLET BY MOUTH DAILY   lithium carbonate 150 MG capsule Take 3 capsules (450 mg total) by mouth daily.   losartan (COZAAR) 100 MG tablet Take 100 mg by mouth daily.   Lysine HCl 1000 MG TABS Take 1,000 mg by mouth 2 (two) times daily.    nitroGLYCERIN (NITROSTAT) 0.4 MG SL tablet Place 1 tablet (0.4 mg total) under the tongue every 5 (five) minutes as needed for chest pain.   omeprazole (PRILOSEC) 40 MG capsule Take 1 capsule (40 mg total) by mouth in the morning and at bedtime.   simvastatin (ZOCOR) 5 MG tablet TAKE ONE TABLET BY MOUTH DAILY   vitamin B-12 (CYANOCOBALAMIN) 1000 MCG tablet Take 1,000 mcg by mouth daily.     Allergies:   Methotrexate, Nsaids, Plaquenil [hydroxychloroquine], Azulfidine [sulfasalazine], Crestor [rosuvastatin], Iodinated contrast media, Isosorbide, Mevacor [lovastatin], and Pravachol [pravastatin]   Social History   Socioeconomic History   Marital status: Married    Spouse name: Not on file   Number of children: Not on file   Years of education: Not on file   Highest education level: 12th grade  Occupational History   Not on file  Tobacco Use   Smoking status: Former    Packs/day: 2.00    Years: 10.00    Additional pack years: 0.00    Total pack years: 20.00    Types:  Cigarettes    Quit date: 67    Years since quitting: 50.5    Passive exposure: Past   Smokeless tobacco: Never   Tobacco comments:    Quit>40 years  Vaping Use   Vaping Use: Never used  Substance and Sexual Activity   Alcohol use: Not Currently    Alcohol/week: 0.0 standard drinks of alcohol   Drug use: Never   Sexual activity: Not on file  Other Topics Concern   Not on file  Social History Narrative   Not on file   Social Determinants of Health   Financial Resource Strain: Low Risk  (03/20/2023)   Overall Financial Resource Strain (CARDIA)  Difficulty of Paying Living Expenses: Not hard at all  Food Insecurity: No Food Insecurity (03/20/2023)   Hunger Vital Sign    Worried About Running Out of Food in the Last Year: Never true    Ran Out of Food in the Last Year: Never true  Transportation Needs: No Transportation Needs (03/20/2023)   PRAPARE - Administrator, Civil Service (Medical): No    Lack of Transportation (Non-Medical): No  Physical Activity: Inactive (03/20/2023)   Exercise Vital Sign    Days of Exercise per Week: 0 days    Minutes of Exercise per Session: 0 min  Stress: Stress Concern Present (03/20/2023)   [employer] of Occupational Health - Occupational Stress Questionnaire    Feeling of Stress : Rather much  Social Connections: Unknown (03/20/2023)   Social Connection and Isolation Panel [NHANES]    Frequency of Communication with Friends and Family: Never    Frequency of Social Gatherings with Friends and Family: Patient declined    Attends Religious Services: Never    Database administrator or Organizations: No    Attends Engineer, structural: Never    Marital Status: Married  Recent Concern: Social Connections - Socially Isolated (01/22/2023)   Social Connection and Isolation Panel [NHANES]    Frequency of Communication with Friends and Family: Once a week    Frequency of Social Gatherings with Friends and Family: Once a week     Attends Religious Services: Never    Database administrator or Organizations: No    Attends Engineer, structural: Never    Marital Status: Married     Family History: The patient's family history includes Alcoholism in his brother and father; Alzheimer's disease (age of onset: 34) in his mother; Heart disease (age of onset: 25) in his paternal grandfather; Heart disease (age of onset: 9) in his maternal grandfather; Hypertension in his father and son; Stroke (age of onset: 75) in his father.  ROS:   Please see the history of present illness.    All other systems reviewed and are negative.  EKGs/Labs/Other Studies Reviewed:    The following studies were reviewed today: EKG reveals sinus bradycardia right bundle branch block left posterior fascicular block and nonspecific ST-T changes.   Recent Labs: 06/12/2022: TSH 1.27 03/31/2023: ALT 18; BUN 20; Creat 1.36; Hemoglobin 11.1; Platelets 188; Potassium 4.4; Sodium 141  Recent Lipid Panel    Component Value Date/Time   CHOL 107 01/02/2022 1050   CHOL 109 11/28/2020 0841   TRIG 193.0 (H) 01/02/2022 1050   HDL 28.90 (L) 01/02/2022 1050   HDL 33 (L) 11/28/2020 0841   CHOLHDL 4 01/02/2022 1050   VLDL 38.6 01/02/2022 1050   LDLCALC 39 01/02/2022 1050   LDLCALC 57 11/28/2020 0841   LDLDIRECT 99.0 12/01/2019 1056    Physical Exam:    VS:  BP (!) 140/58   Pulse (!) 55   Ht 5\' 7"  (1.702 m)   Wt 161 lb 0.6 oz (73 kg)   SpO2 98%   BMI 25.22 kg/m     Wt Readings from Last 3 Encounters:  06/03/23 161 lb 0.6 oz (73 kg)  03/31/23 168 lb (76.2 kg)  03/21/23 168 lb (76.2 kg)     GEN: Patient is in no acute distress HEENT: Normal NECK: No JVD; No carotid bruits LYMPHATICS: No lymphadenopathy CARDIAC: Hear sounds regular, 2/6 systolic murmur at the apex. RESPIRATORY:  Clear to auscultation without rales, wheezing or rhonchi  ABDOMEN: Soft, non-tender, non-distended MUSCULOSKELETAL:  No edema; No deformity  SKIN: Warm  and dry NEUROLOGIC:  Alert and oriented x 3 PSYCHIATRIC:  Normal affect   Signed, Garwin Brothers, MD  06/03/2023 9:50 AM    [facility]

## 2023-06-03 NOTE — Patient Instructions (Signed)
Medication Instructions:  Your physician has recommended you make the following change in your medication:   Take 81 mg coated aspirin daily.  Use nitroglycerin 1 tablet placed under the tongue at the first sign of chest pain or an angina attack. 1 tablet may be used every 5 minutes as needed, for up to 15 minutes. Do not take more than 3 tablets in 15 minutes. If pain persist call 911 or go to the nearest ED.   *If you need a refill on your cardiac medications before your next appointment, please call your pharmacy*   Lab Work: Your physician recommends that you have a BMET and CBC today in the office for your upcoming procedure.  If you have labs (blood work) drawn today and your tests are completely normal, you will receive your results only by: MyChart Message (if you have MyChart) OR A paper copy in the mail If you have any lab test that is abnormal or we need to change your treatment, we will call you to review the results.   Testing/Procedures:  [place] [facility] [place] [address] [place], [place] [address] [id number] [facility] [place] [address] Dept: [phone] Loc: [phone]  Noah Grant  06/03/2023  You are scheduled for a Cardiac Catheterization on Friday, June 28 with Dr. Bryan Lemma.  1. Please arrive at the [facility] (Main Entrance A) at [facility]: [address] [place], [place] [address] at 5:30 AM (This time is 2 hour(s) before your procedure to ensure your preparation). Free valet parking service is available. You will check in at ADMITTING. The support person will be asked to wait in the waiting room.  It is OK to have someone drop you off and come back when you are ready to be discharged.    Special note: Every effort is made to have your procedure done on time. Please understand that emergencies sometimes delay scheduled procedures.  2. Diet: Do not eat solid  foods after midnight.  The patient may have clear liquids until 5am upon the day of the procedure.  3. Labs: You had your labs done today at the office.  4. Medication instructions in preparation for your procedure:   Contrast Allergy: Yes, Please take Prednisone 50mg  by mouth at: Thirteen hours prior to cath 6:00pm on Thursday Seven hours prior to cath 1:00am on Friday And prior to leaving home please take last dose of Prednisone 50mg  and Benadryl 50mg  by mouth. Stop taking Eliquis (Apixiban) on Wednesday, June 26.  Stop taking, Cozaar (Losartan) Friday, June 28,   On the morning of your procedure, take your Aspirin 81 mg and any morning medicines NOT listed above.  You may use sips of water.  5. Plan to go home the same day, you will only stay overnight if medically necessary. 6. Bring a current list of your medications and current insurance cards. 7. You MUST have a responsible person to drive you home. 8. Someone MUST be with you the first 24 hours after you arrive home or your discharge will be delayed. 9. Please wear clothes that are easy to get on and off and wear slip-on shoes.  Thank you for allowing [place] to care for you!   -- [facility]    Follow-Up: At [facility], you and your health needs are our priority.  As part of our continuing mission to provide you with exceptional heart  care, we have created designated Provider Care Teams.  These Care Teams include your primary Cardiologist (physician) and Advanced Practice Providers (APPs -  Physician Assistants and Nurse Practitioners) who all work together to provide you with the care you need, when you need it.  We recommend signing up for the patient portal called "MyChart".  Sign up information is provided on this After Visit Summary.  MyChart is used to connect with patients for Virtual Visits (Telemedicine).  Patients are able to view lab/test results, encounter notes, upcoming  appointments, etc.  Non-urgent messages can be sent to your provider as well.   To learn more about what you can do with MyChart, go to [email or web address].    Your next appointment:   2 month(s)  The format for your next appointment:   In Person  Provider:   Belva Crome, MD   Other Instructions  Coronary Angiogram With Stent Coronary angiogram with stent placement is a procedure to widen or open a narrow blood vessel of the heart (coronary artery). Arteries may become blocked by cholesterol buildup (plaques) in the lining of the artery wall. When a coronary artery becomes partially blocked, blood flow to that area decreases. This may lead to chest pain or a heart attack (myocardial infarction). A stent is a small piece of metal that looks like mesh or spring. Stent placement may be done as treatment after a heart attack, or to prevent a heart attack if a blocked artery is found by a coronary angiogram. Let your health care provider know about: Any allergies you have, including allergies to medicines or contrast dye. All medicines you are taking, including vitamins, herbs, eye drops, creams, and over-the-counter medicines. Any problems you or family members have had with anesthetic medicines. Any blood disorders you have. Any surgeries you have had. Any medical conditions you have, including kidney problems or kidney failure. Whether you are pregnant or may be pregnant. Whether you are breastfeeding. What are the risks? Generally, this is a safe procedure. However, serious problems may occur, including: Damage to nearby structures or organs, such as the heart, blood vessels, or kidneys. A return of blockage. Bleeding, infection, or bruising at the insertion site. A collection of blood under the skin (hematoma) at the insertion site. A blood clot in another part of the body. Allergic reaction to medicines or dyes. Bleeding into the abdomen (retroperitoneal bleeding). Stroke  (rare). Heart attack (rare). What happens before the procedure? Staying hydrated Follow instructions from your health care provider about hydration, which may include: Up to 2 hours before the procedure - you may continue to drink clear liquids, such as water, clear fruit juice, black coffee, and plain tea.    Eating and drinking restrictions Follow instructions from your health care provider about eating and drinking, which may include: 8 hours before the procedure - stop eating heavy meals or foods, such as meat, fried foods, or fatty foods. 6 hours before the procedure - stop eating light meals or foods, such as toast or cereal. 2 hours before the procedure - stop drinking clear liquids. Medicines Ask your health care provider about: Changing or stopping your regular medicines. This is especially important if you are taking diabetes medicines or blood thinners. Taking medicines such as aspirin and ibuprofen. These medicines can thin your blood. Do not take these medicines unless your health care provider tells you to take them. Generally, aspirin is recommended before a thin tube, called a catheter, is passed through a blood vessel and  inserted into the heart (cardiac catheterization). Taking over-the-counter medicines, vitamins, herbs, and supplements. General instructions Do not use any products that contain nicotine or tobacco for at least 4 weeks before the procedure. These products include cigarettes, e-cigarettes, and chewing tobacco. If you need help quitting, ask your health care provider. Plan to have someone take you home from the hospital or clinic. If you will be going home right after the procedure, plan to have someone with you for 24 hours. You may have tests and imaging procedures. Ask your health care provider: How your insertion site will be marked. Ask which artery will be used for the procedure. What steps will be taken to help prevent infection. These may  include: Removing hair at the insertion site. Washing skin with a germ-killing soap. Taking antibiotic medicine. What happens during the procedure? An IV will be inserted into one of your veins. Electrodes may be placed on your chest to monitor your heart rate during the procedure. You will be given one or more of the following: A medicine to help you relax (sedative). A medicine to numb the area (local anesthetic) for catheter insertion. A small incision will be made for catheter insertion. The catheter will be inserted into an artery using a guide wire. The location may be in your groin, your wrist, or the fold of your arm (near your elbow). An X-ray procedure (fluoroscopy) will be used to help guide the catheter to the opening of the heart arteries. A dye will be injected into the catheter. X-rays will be taken. The dye helps to show where any narrowing or blockages are located in the arteries. Tell your health care provider if you have chest pain or trouble breathing. A tiny wire will be guided to the blocked spot, and a balloon will be inflated to make the artery wider. The stent will be expanded to crush the plaques into the wall of the vessel. The stent will hold the area open and improve the blood flow. Most stents have a drug coating to reduce the risk of the stent narrowing over time. The artery may be made wider using a drill, laser, or other tools that remove plaques. The catheter will be removed when the blood flow improves. The stent will stay where it was placed, and the lining of the artery will grow over it. A bandage (dressing) will be placed on the insertion site. Pressure will be applied to stop bleeding. The IV will be removed. This procedure may vary among health care providers and hospitals.    What happens after the procedure? Your blood pressure, heart rate, breathing rate, and blood oxygen level will be monitored until you leave the hospital or clinic. If the  procedure is done through the leg, you will lie flat in bed for a few hours or for as long as told by your health care provider. You will be instructed not to bend or cross your legs. The insertion site and the pulse in your foot or wrist will be checked often. You may have more blood tests, X-rays, and a test that records the electrical activity of your heart (electrocardiogram, or ECG). Do not drive for 24 hours if you were given a sedative during your procedure. Summary Coronary angiogram with stent placement is a procedure to widen or open a narrowed coronary artery. This is done to treat heart problems. Before the procedure, let your health care provider know about all the medical conditions and surgeries you have or have had.  This is a safe procedure. However, some problems may occur, including damage to nearby structures or organs, bleeding, blood clots, or allergies. Follow your health care provider's instructions about eating, drinking, medicines, and other lifestyle changes, such as quitting tobacco use before the procedure. This information is not intended to replace advice given to you by your health care provider. Make sure you discuss any questions you have with your health care provider. Document Revised: 06/16/2019 Document Reviewed: 06/16/2019 Elsevier Patient Education  2021 Elsevier Inc.  Aspirin and Your Heart Aspirin is a medicine that prevents the platelets in your blood from sticking together. Platelets are the cells that your blood uses for clotting. Aspirin can be used to help reduce the risk of blood clots, heart attacks, and other heart-related problems. What are the risks? Daily use of aspirin can cause side effects. Some of these include: Bleeding. Bleeding can be minor or serious. An example of minor bleeding is bleeding from a cut, and the bleeding does not stop. An example of more serious bleeding is stomach bleeding or, rarely, bleeding into the brain. Your risk of  bleeding increases if you are also taking NSAIDs, such as ibuprofen. Increased bruising. Upset stomach. An allergic reaction. People who have growths inside the nose (nasal polyps) have an increased risk of developing an aspirin allergy. How to use aspirin to care for your heart Take aspirin only as told by your health care provider. Make sure that you understand how much to take and what form to take. The two forms of aspirin are: Non-enteric-coated.This type of aspirin does not have a coating and is absorbed quickly. This type of aspirin also comes in a chewable form. Enteric-coated. This type of aspirin has a coating that releases the medicine very slowly. Enteric-coated aspirin might cause less stomach upset than non-enteric-coated aspirin. This type of aspirin should not be chewed or crushed. Work with your health care provider to find out whether it is safe and beneficial for you to take aspirin daily. Taking aspirin daily may be helpful if: You have had a heart attack or chest pain, or you are at risk for a heart attack. You have a condition in which certain heart vessels are blocked (coronary artery disease), and you have had a procedure to treat it. Examples are: Open-heart surgery, such as coronary artery bypass surgery (CABG). Coronary angioplasty,which is done to widen a blood vessel of your heart. Having a small mesh tube, or stent, placed in your coronary artery. You have had certain types of stroke or a mini-stroke known as a transient ischemic attack (TIA). You have a narrowing of the arteries that supply the limbs (peripheral artery disease, or PAD). You have long-term (chronic) heart rhythm problems, such as atrial fibrillation, and your health care provider thinks aspirin may help. You have valve disease or have had surgery on a valve. You are considered at increased risk of developing coronary artery disease or PAD.    Follow these instructions at home Medicines Take  over-the-counter and prescription medicines only as told by your health care provider. If you are taking blood thinners: Talk with your health care provider before you take any medicines that contain aspirin or NSAIDs, such as ibuprofen. These medicines increase your risk for dangerous bleeding. Take your medicine exactly as told, at the same time every day. Avoid activities that could cause injury or bruising, and follow instructions about how to prevent falls. Wear a medical alert bracelet or carry a card that lists what  medicines you take. General instructions Do not drink alcohol if: Your health care provider tells you not to drink. You are pregnant, may be pregnant, or are planning to become pregnant. If you drink alcohol: Limit how much you use to: 0-1 drink a day for women. 0-2 drinks a day for men. Be aware of how much alcohol is in your drink. In the [place] one drink equals one 12 oz bottle of beer (355 mL), one 5 oz glass of wine (148 mL), or one 1 oz glass of hard liquor (44 mL). Keep all follow-up visits as told by your health care provider. This is important. Where to find more information The American Heart Association: www.heart.org Contact a health care provider if you have: Unusual bleeding or bruising. Stomach pain or nausea. Ringing in your ears. An allergic reaction that causes hives, itchy skin, or swelling of the lips, tongue, or face. Get help right away if: You notice that your bowel movements are bloody, or dark red or black in color. You vomit or cough up blood. You have blood in your urine. You cough, breathe loudly (wheeze), or feel short of breath. You have chest pain, especially if the pain spreads to your arms, back, neck, or jaw. You have a headache with confusion. You have any symptoms of a stroke. "BE FAST" is an easy way to remember the main warning signs of a stroke: B - Balance. Signs are dizziness, sudden trouble walking, or loss of balance. E -  Eyes. Signs are trouble seeing or a sudden change in vision. F - Face. Signs are sudden weakness or numbness of the face, or the face or eyelid drooping on one side. A - Arms. Signs are weakness or numbness in an arm. This happens suddenly and usually on one side of the body. S - Speech. Signs are sudden trouble speaking, slurred speech, or trouble understanding what people say. T - Time. Time to call emergency services. Write down what time symptoms started. You have other signs of a stroke, such as: A sudden, severe headache with no known cause. Nausea or vomiting. Seizure. These symptoms may represent a serious problem that is an emergency. Do not wait to see if the symptoms will go away. Get medical help right away. Call your local emergency services (911 in the [place] Do not drive yourself to the hospital. Summary Aspirin use can help reduce the risk of blood clots, heart attacks, and other heart-related problems. Daily use of aspirin can cause side effects. Take aspirin only as told by your health care provider. Make sure that you understand how much to take and what form to take. Your health care provider will help you determine whether it is safe and beneficial for you to take aspirin daily. This information is not intended to replace advice given to you by your health care provider. Make sure you discuss any questions you have with your health care provider. Document Revised: 08/30/2019 Document Reviewed: 08/30/2019 Elsevier Patient Education  2021 Elsevier Inc. Nitroglycerin sublingual tablets What is this medicine? NITROGLYCERIN (nye troe GLI ser in) is a type of vasodilator. It relaxes blood vessels, increasing the blood and oxygen supply to your heart. This medicine is used to relieve chest pain caused by angina. It is also used to prevent chest pain before activities like climbing stairs, going outdoors in cold weather, or sexual activity. This medicine may be used for other  purposes; ask your health care provider or pharmacist if you have questions. COMMON  BRAND NAME(S): Nitroquick, Nitrostat, Nitrotab What should I tell my health care provider before I take this medicine? They need to know if you have any of these conditions: anemia head injury, recent stroke, or bleeding in the brain liver disease previous heart attack an unusual or allergic reaction to nitroglycerin, other medicines, foods, dyes, or preservatives pregnant or trying to get pregnant breast-feeding How should I use this medicine? Take this medicine by mouth as needed. Use at the first sign of an angina attack (chest pain or tightness). You can also take this medicine 5 to 10 minutes before an event likely to produce chest pain. Follow the directions exactly as written on the prescription label. Place one tablet under your tongue and let it dissolve. Do not swallow whole. Replace the dose if you accidentally swallow it. It will help if your mouth is not dry. Saliva around the tablet will help it to dissolve more quickly. Do not eat or drink, smoke or chew tobacco while a tablet is dissolving. Sit down when taking this medicine. In an angina attack, you should feel better within 5 minutes after your first dose. You can take a dose every 5 minutes up to a total of 3 doses. If you do not feel better or feel worse after 1 dose, call 9-1-1 at once. Do not take more than 3 doses in 15 minutes. Your health care provider might give you other directions. Follow those directions if he or she does. Do not take your medicine more often than directed. Talk to your health care provider about the use of this medicine in children. Special care may be needed. Overdosage: If you think you have taken too much of this medicine contact a poison control center or emergency room at once. NOTE: This medicine is only for you. Do not share this medicine with others. What if I miss a dose? This does not apply. This medicine is  only used as needed. What may interact with this medicine? Do not take this medicine with any of the following medications: certain migraine medicines like ergotamine and dihydroergotamine (DHE) medicines used to treat erectile dysfunction like sildenafil, tadalafil, and vardenafil riociguat This medicine may also interact with the following medications: alteplase aspirin heparin medicines for high blood pressure medicines for mental depression other medicines used to treat angina phenothiazines like chlorpromazine, mesoridazine, prochlorperazine, thioridazine This list may not describe all possible interactions. Give your health care provider a list of all the medicines, herbs, non-prescription drugs, or dietary supplements you use. Also tell them if you smoke, drink alcohol, or use illegal drugs. Some items may interact with your medicine. What should I watch for while using this medicine? Tell your doctor or health care professional if you feel your medicine is no longer working. Keep this medicine with you at all times. Sit or lie down when you take your medicine to prevent falling if you feel dizzy or faint after using it. Try to remain calm. This will help you to feel better faster. If you feel dizzy, take several deep breaths and lie down with your feet propped up, or bend forward with your head resting between your knees. You may get drowsy or dizzy. Do not drive, use machinery, or do anything that needs mental alertness until you know how this drug affects you. Do not stand or sit up quickly, especially if you are an older patient. This reduces the risk of dizzy or fainting spells. Alcohol can make you more drowsy and dizzy.  Avoid alcoholic drinks. Do not treat yourself for coughs, colds, or pain while you are taking this medicine without asking your doctor or health care professional for advice. Some ingredients may increase your blood pressure. What side effects may I notice from  receiving this medicine? Side effects that you should report to your doctor or health care professional as soon as possible: allergic reactions (skin rash, itching or hives; swelling of the face, lips, or tongue) low blood pressure (dizziness; feeling faint or lightheaded, falls; unusually weak or tired) low red blood cell counts (trouble breathing; feeling faint; lightheaded, falls; unusually weak or tired) Side effects that usually do not require medical attention (report to your doctor or health care professional if they continue or are bothersome): facial flushing (redness) headache nausea, vomiting This list may not describe all possible side effects. Call your doctor for medical advice about side effects. You may report side effects to FDA at 1-800-FDA-1088. Where should I keep my medicine? Keep out of the reach of children. Store at room temperature between 20 and 25 degrees C (68 and 77 degrees F). Store in Retail buyer. Protect from light and moisture. Keep tightly closed. Throw away any unused medicine after the expiration date. NOTE: This sheet is a summary. It may not cover all possible information. If you have questions about this medicine, talk to your doctor, pharmacist, or health care provider.  2021 Elsevier/Gold Standard (2018-08-26 16:46:32)

## 2023-06-03 NOTE — Progress Notes (Signed)
Cardiology Office Note:    Date:  06/03/2023   ID:  Noah Grant, DOB 04/28/1951, MRN [id number]  PCP:  [name], [name], DO  Cardiologist:  [name], MD   Referring MD: [name], [name]*    ASSESSMENT:    1. Essential hypertension   2. Coronary artery disease involving native coronary artery of native heart with angina pectoris (HCC)   3. Abdominal aortic atherosclerosis (HCC)   4. History of stroke   5. Angina pectoris (HCC)   6. Chronic kidney disease, stage 3a (HCC)    PLAN:    In order of problems listed above:  Angina pectoris: Coronary artery disease:In view of the patient's symptoms, I discussed with the patient options for evaluation. Invasive and noninvasive options were given to the patient. I discussed stress testing and coronary angiography and left heart catheterization at length. Benefits, pros and cons of each approach were discussed at length. Patient had multiple questions which were answered to the patient's satisfaction. Patient opted for invasive evaluation and we will set up for coronary angiography and left heart catheterization. Further recommendations will be made based on the findings with coronary angiography. In the interim if the patient has any significant symptoms in hospital to the nearest emergency room. Patient has renal insufficiency and therefore there may not be a pressing reason for performing left ventriculography.  Will leave this to the discretion of the interventional cardiologist for final decisions.  Advised patient about the precautions to take for diarrhea and discussed this with the nurse also.  I told him to keep himself well-hydrated before and after the test for renal protection. Essential hypertension: Blood pressure stable and diet was emphasized. Mixed dyslipidemia: On lipid-lowering medications followed by primary care. Paroxysmal atrial fibrillation:I discussed with the patient atrial fibrillation, disease  process. Management and therapy including rate and rhythm control, anticoagulation benefits and potential risks were discussed extensively with the patient. Patient had multiple questions which were answered to patient's satisfaction. Patient will be seen in follow-up appointment in 6 months or earlier if the patient has any concerns.    Medication Adjustments/Labs and Tests Ordered: Current medicines are reviewed at length with the patient today.  Concerns regarding medicines are outlined above.  No orders of the defined types were placed in this encounter.  No orders of the defined types were placed in this encounter.    No chief complaint on file.    History of Present Illness:    Noah Grant is a 72 y.o. male.  Patient has past medical history of coronary artery disease post stenting, paroxysmal atrial fibrillation, myocarditis, mixed dyslipidemia.  He mentions to me that he is having dyspnea on exertion and chest pain.  He does not use nitroglycerin.  No orthopnea or PND.  The symptoms are concerning to him.  Patient has history of renal insufficiency.  At the time of my evaluation, the patient is alert awake oriented and in no distress.  He gives a history of dye allergy the last time he underwent coronary angiography.  At the time of my evaluation, the patient is alert awake oriented and in no distress.  Past Medical History:  Diagnosis Date   Abdominal aortic atherosclerosis (HCC)    Anemia    likely of chronic disease   Angina pectoris (HCC) 08/30/2020   Arthritis    Atrial fibrillation (HCC)    Atrial fibrillation with rapid ventricular response (HCC) 05/12/2022   Barrett's esophagus without dysplasia 03/25/2019     Formatting of this note might be different from the original. EGD done in 02/2019. Due in 3 years.   Benign prostatic hyperplasia with nocturia 12/29/2018   Bipolar 1 disorder (HCC)    Bipolar 1 disorder, mixed, moderate (HCC) 08/16/2014   Bipolar 2 disorder  (HCC) 08/16/2014   Bipolar affective disorder, currently depressed, moderate (HCC) 03/08/2020   Bipolar depression (HCC) 08/16/2014   BPH with urinary obstruction 12/29/2018   Brain ischemia 03/08/2020   Cardiac murmur 08/30/2020   Chest pain 09/06/2020   Chicken pox    Chronic bronchitis (HCC)    Chronic kidney disease, stage 3a (HCC) 07/21/2019   Cognitive complaints 03/08/2020   Coronary artery disease involving native coronary artery of native heart 09/06/2020   Coronary artery disease involving native coronary artery of native heart with angina pectoris (HCC) 09/06/2020   Depression    Disc degeneration, 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 Last Assessment & Plan:  Well controlled. Continue current medications.   GERD without esophagitis 08/16/2014   Formatting of this note might be different from the original. Last Assessment & Plan:  Well controlled. Continue current medications.   Herpes gingivostomatitis 02/10/2015   High risk medication use 03/23/2018   History of colon polyps 03/25/2019   Formatting of this note might be different from the original. tubular adenoma   History of stroke 11/10/2019   Hyperlipidemia    Hyperphosphatemia 10/30/2022   Hypertension    Ingrowing nail 03/12/2020   Intention tremor 06/16/2019   Labile hypertension 08/16/2014   Formatting of this note might be different from the original. Last Assessment & Plan:  Slightly above goal today secondary to pain. Will continue current regimen. Will check CMP today.   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 Concern over possible lichen planus. His  dentist noticed lesions of his oral mucosa.  It was felt to be lichen planus.  He presents to discuss further.  Rarely does the area hurt.He smoked in the distant past EXAM shows several white patches on the buccal mucosa bilaterally.  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 Rheum:  Dr. Ziolkowska Formerly on MTX - stopped due to mouth sores. Currently started on Arava.  Also on 5 mg prednisone x 3 months during run-in.   Right-sided chest wall pain 04/17/2019   Seasonal allergies 03/23/2018   Sepsis (HCC) 05/11/2022   Stasis dermatitis of both legs 07/29/2022   Statin intolerance 07/31/2020   Task-specific dystonia of hand    right hand   Thoracic radiculopathy 06/16/2019   Vitamin B12 deficiency    Injections   Vitamin D deficiency   09/17/2016    Past Surgical History:  Procedure Laterality Date   CLAVICLE SURGERY     Right, Hardware placed   CORONARY STENT INTERVENTION N/A 09/06/2020   Procedure: CORONARY STENT INTERVENTION;  Surgeon: Cooper, Michael, MD;  Location: [facility];  Service: Cardiovascular;  Laterality: N/A;   ingrown toenail Bilateral    great toes   LEFT HEART CATH AND CORONARY ANGIOGRAPHY N/A 09/06/2020   Procedure: LEFT HEART CATH AND CORONARY ANGIOGRAPHY;  Surgeon: Cooper, Michael, MD;  Location: [facility];  Service: Cardiovascular;  Laterality: N/A;   WISDOM TOOTH EXTRACTION      Current  Medications: Current Meds  Medication Sig   amLODipine (NORVASC) 5 MG tablet Take 2 tablets (10 mg total) by mouth daily.   Cholecalciferol 50 MCG (2000 UT) CAPS Take 2,000 Units by mouth daily.    Coenzyme Q10 (COQ10) 200 MG CAPS Take 200 mg by mouth daily.   diclofenac Sodium (VOLTAREN) 1 % GEL Apply 1 application  topically daily as needed for pain.   ELIQUIS 5 MG TABS tablet TAKE 1 TABLET BY MOUTH TWICE A DAY   ezetimibe (ZETIA) 10 MG tablet Take 1 tablet (10 mg total) by mouth daily.   finasteride (PROSCAR) 5 MG tablet TAKE 1 TABLET BY MOUTH DAILY   hydrALAZINE (APRESOLINE) 50 MG tablet Take 50 mg by mouth 3 (three) times daily.   leflunomide (ARAVA) 20 MG tablet TAKE 1 TABLET BY MOUTH DAILY   lithium carbonate 150 MG capsule Take 3 capsules (450 mg total) by mouth daily.   losartan (COZAAR) 100 MG tablet Take 100 mg by mouth daily.   Lysine HCl 1000 MG TABS Take 1,000 mg by mouth 2 (two) times daily.    nitroGLYCERIN (NITROSTAT) 0.4 MG SL tablet Place 1 tablet (0.4 mg total) under the tongue every 5 (five) minutes as needed for chest pain.   omeprazole (PRILOSEC) 40 MG capsule Take 1 capsule (40 mg total) by mouth in the morning and at bedtime.   simvastatin (ZOCOR) 5 MG tablet TAKE ONE TABLET BY MOUTH DAILY   vitamin B-12 (CYANOCOBALAMIN) 1000 MCG tablet Take 1,000 mcg by mouth daily.     Allergies:   Methotrexate, Nsaids, Plaquenil [hydroxychloroquine], Azulfidine [sulfasalazine], Crestor [rosuvastatin], Iodinated contrast media, Isosorbide, Mevacor [lovastatin], and Pravachol [pravastatin]   Social History   Socioeconomic History   Marital status: Married    Spouse name: Not on file   Number of children: Not on file   Years of education: Not on file   Highest education level: 12th grade  Occupational History   Not on file  Tobacco Use   Smoking status: Former    Packs/day: 2.00    Years: 10.00    Additional pack years: 0.00    Total pack years: 20.00    Types:  Cigarettes    Quit date: 1974    Years since quitting: 50.5    Passive exposure: Past   Smokeless tobacco: Never   Tobacco comments:    Quit>40 years  Vaping Use   Vaping Use: Never used  Substance and Sexual Activity   Alcohol use: Not Currently    Alcohol/week: 0.0 standard drinks of alcohol   Drug use: Never   Sexual activity: Not on file  Other Topics Concern   Not on file  Social History Narrative   Not on file   Social Determinants of Health   Financial Resource Strain: Low Risk  (03/20/2023)   Overall Financial Resource Strain (CARDIA)      Difficulty of Paying Living Expenses: Not hard at all  Food Insecurity: No Food Insecurity (03/20/2023)   Hunger Vital Sign    Worried About Running Out of Food in the Last Year: Never true    Ran Out of Food in the Last Year: Never true  Transportation Needs: No Transportation Needs (03/20/2023)   PRAPARE - Transportation    Lack of Transportation (Medical): No    Lack of Transportation (Non-Medical): No  Physical Activity: Inactive (03/20/2023)   Exercise Vital Sign    Days of Exercise per Week: 0 days    Minutes of Exercise per Session: 0 min  Stress: Stress Concern Present (03/20/2023)   [employer] - Occupational Stress Questionnaire    Feeling of Stress : Rather much  Social Connections: Unknown (03/20/2023)   Social Connection and Isolation Panel [NHANES]    Frequency of Communication with Friends and Family: Never    Frequency of Social Gatherings with Friends and Family: Patient declined    Attends Religious Services: Never    Active Member of Clubs or Organizations: No    Attends Club or Organization Meetings: Never    Marital Status: Married  Recent Concern: Social Connections - Socially Isolated (01/22/2023)   Social Connection and Isolation Panel [NHANES]    Frequency of Communication with Friends and Family: Once a week    Frequency of Social Gatherings with Friends and Family: Once a week     Attends Religious Services: Never    Active Member of Clubs or Organizations: No    Attends Club or Organization Meetings: Never    Marital Status: Married     Family History: The patient's family history includes Alcoholism in his brother and father; Alzheimer's disease (age of onset: 70) in his mother; Heart disease (age of onset: 53) in his paternal grandfather; Heart disease (age of onset: 60) in his maternal grandfather; Hypertension in his father and son; Stroke (age of onset: 62) in his father.  ROS:   Please see the history of present illness.    All other systems reviewed and are negative.  EKGs/Labs/Other Studies Reviewed:    The following studies were reviewed today: EKG reveals sinus bradycardia right bundle branch block left posterior fascicular block and nonspecific ST-T changes.   Recent Labs: 06/12/2022: TSH 1.27 03/31/2023: ALT 18; BUN 20; Creat 1.36; Hemoglobin 11.1; Platelets 188; Potassium 4.4; Sodium 141  Recent Lipid Panel    Component Value Date/Time   CHOL 107 01/02/2022 1050   CHOL 109 11/28/2020 0841   TRIG 193.0 (H) 01/02/2022 1050   HDL 28.90 (L) 01/02/2022 1050   HDL 33 (L) 11/28/2020 0841   CHOLHDL 4 01/02/2022 1050   VLDL 38.6 01/02/2022 1050   LDLCALC 39 01/02/2022 1050   LDLCALC 57 11/28/2020 0841   LDLDIRECT 99.0 12/01/2019 1056    Physical Exam:    VS:  BP (!) 140/58   Pulse (!) 55   Ht 5' 7" (1.702 m)   Wt 161 lb 0.6 oz (73 kg)   SpO2 98%   BMI 25.22 kg/m     Wt Readings from Last 3 Encounters:  06/03/23 161 lb 0.6 oz (73 kg)  03/31/23 168 lb (76.2 kg)  03/21/23 168 lb (76.2 kg)     GEN: Patient is in no acute distress HEENT: Normal NECK: No JVD; No carotid bruits LYMPHATICS: No lymphadenopathy CARDIAC: Hear sounds regular, 2/6 systolic murmur at the apex. RESPIRATORY:  Clear to auscultation without rales, wheezing or rhonchi    ABDOMEN: Soft, non-tender, non-distended MUSCULOSKELETAL:  No edema; No deformity  SKIN: Warm  and dry NEUROLOGIC:  Alert and oriented x 3 PSYCHIATRIC:  Normal affect   Signed, Apollonia Amini R Jaysiah Marchetta, MD  06/03/2023 9:50 AM    [facility]  

## 2023-06-04 LAB — BASIC METABOLIC PANEL
BUN/Creatinine Ratio: 15 (ref 10–24)
BUN: 20 mg/dL (ref 8–27)
CO2: 24 mmol/L (ref 20–29)
Calcium: 9.5 mg/dL (ref 8.6–10.2)
Chloride: 107 mmol/L — ABNORMAL HIGH (ref 96–106)
Creatinine, Ser: 1.3 mg/dL — ABNORMAL HIGH (ref 0.76–1.27)
Glucose: 92 mg/dL (ref 70–99)
Potassium: 4.7 mmol/L (ref 3.5–5.2)
Sodium: 142 mmol/L (ref 134–144)
eGFR: 59 mL/min/{1.73_m2} — ABNORMAL LOW (ref >59)

## 2023-06-04 LAB — CBC
Hematocrit: 35.8 % — ABNORMAL LOW (ref 37.5–51.0)
Hemoglobin: 11.4 g/dL — ABNORMAL LOW (ref 13.0–17.7)
MCH: 28.9 pg (ref 26.6–33.0)
MCHC: 31.8 g/dL (ref 31.5–35.7)
MCV: 91 fL (ref 79–97)
Platelets: 230 10*3/uL (ref 150–450)
RBC: 3.94 x10E6/uL — ABNORMAL LOW (ref 4.14–5.80)
RDW: 13.8 % (ref 11.6–15.4)
WBC: 5.3 10*3/uL (ref 3.4–10.8)

## 2023-06-06 ENCOUNTER — Encounter (HOSPITAL_COMMUNITY)
Admission: RE | Disposition: A | Discharge status: Home / Self Care | Payer: Self-pay | Source: Home / Self Care | Attending: Cardiology

## 2023-06-06 ENCOUNTER — Ambulatory Visit (HOSPITAL_COMMUNITY)
Admission: RE | Admit: 2023-06-06 | Discharge: 2023-06-06 | Disposition: A | Discharge status: Home / Self Care | Payer: PPO | Attending: Cardiology | Admitting: Cardiology

## 2023-06-06 ENCOUNTER — Other Ambulatory Visit: Payer: Self-pay

## 2023-06-06 DIAGNOSIS — Z87891 Personal history of nicotine dependence: Secondary | ICD-10-CM | POA: Diagnosis not present

## 2023-06-06 DIAGNOSIS — I25119 Atherosclerotic heart disease of native coronary artery with unspecified angina pectoris: Secondary | ICD-10-CM

## 2023-06-06 DIAGNOSIS — I700 Atherosclerosis of aorta: Secondary | ICD-10-CM | POA: Insufficient documentation

## 2023-06-06 DIAGNOSIS — Z8673 Personal history of transient ischemic attack (TIA), and cerebral infarction without residual deficits: Secondary | ICD-10-CM | POA: Insufficient documentation

## 2023-06-06 DIAGNOSIS — I25118 Atherosclerotic heart disease of native coronary artery with other forms of angina pectoris: Secondary | ICD-10-CM | POA: Insufficient documentation

## 2023-06-06 DIAGNOSIS — Z955 Presence of coronary angioplasty implant and graft: Secondary | ICD-10-CM | POA: Diagnosis not present

## 2023-06-06 DIAGNOSIS — I480 Paroxysmal atrial fibrillation: Secondary | ICD-10-CM | POA: Insufficient documentation

## 2023-06-06 DIAGNOSIS — I10 Essential (primary) hypertension: Secondary | ICD-10-CM

## 2023-06-06 DIAGNOSIS — I129 Hypertensive chronic kidney disease with stage 1 through stage 4 chronic kidney disease, or unspecified chronic kidney disease: Secondary | ICD-10-CM | POA: Insufficient documentation

## 2023-06-06 DIAGNOSIS — N1831 Chronic kidney disease, stage 3a: Secondary | ICD-10-CM | POA: Diagnosis not present

## 2023-06-06 DIAGNOSIS — E782 Mixed hyperlipidemia: Secondary | ICD-10-CM | POA: Diagnosis not present

## 2023-06-06 DIAGNOSIS — I209 Angina pectoris, unspecified: Secondary | ICD-10-CM

## 2023-06-06 HISTORY — PX: LEFT HEART CATH AND CORONARY ANGIOGRAPHY: CATH118249

## 2023-06-06 LAB — POCT I-STAT, CHEM 8
BUN: 22 mg/dL (ref 8–23)
Calcium, Ion: 1.17 mmol/L (ref 1.15–1.40)
Chloride: 111 mmol/L (ref 98–111)
Creatinine, Ser: 1.3 mg/dL — ABNORMAL HIGH (ref 0.61–1.24)
Glucose, Bld: 136 mg/dL — ABNORMAL HIGH (ref 70–99)
HCT: 29 % — ABNORMAL LOW (ref 39.0–52.0)
Hemoglobin: 9.9 g/dL — ABNORMAL LOW (ref 13.0–17.0)
Potassium: 3.9 mmol/L (ref 3.5–5.1)
Sodium: 141 mmol/L (ref 135–145)
TCO2: 22 mmol/L (ref 22–32)

## 2023-06-06 SURGERY — LEFT HEART CATH AND CORONARY ANGIOGRAPHY
Anesthesia: LOCAL

## 2023-06-06 MED ORDER — SODIUM CHLORIDE 0.9 % WEIGHT BASED INFUSION
3.0000 mL/kg/h | INTRAVENOUS | Status: AC
  Administered 2023-06-06: 3 mL/kg/h via INTRAVENOUS

## 2023-06-06 MED ORDER — SODIUM CHLORIDE 0.9 % IV SOLN: INTRAVENOUS | Status: DC

## 2023-06-06 MED ORDER — LABETALOL HCL 5 MG/ML IV SOLN: 10.0000 mg | INTRAVENOUS | Status: DC | PRN

## 2023-06-06 MED ORDER — ONDANSETRON HCL 4 MG/2ML IJ SOLN: 4.0000 mg | Freq: Four times a day (QID) | INTRAMUSCULAR | Status: DC | PRN

## 2023-06-06 MED ORDER — LIDOCAINE HCL (PF) 1 % IJ SOLN
INTRAMUSCULAR | Status: DC | PRN
  Administered 2023-06-06: 2 mL via INTRADERMAL

## 2023-06-06 MED ORDER — ACETAMINOPHEN 325 MG PO TABS: 650.0000 mg | ORAL_TABLET | ORAL | Status: DC | PRN

## 2023-06-06 MED ORDER — SODIUM CHLORIDE 0.9% FLUSH: 3.0000 mL | INTRAVENOUS | Status: DC | PRN

## 2023-06-06 MED ORDER — SODIUM CHLORIDE 0.9 % WEIGHT BASED INFUSION: 1.0000 mL/kg/h | INTRAVENOUS | Status: DC

## 2023-06-06 MED ORDER — HEPARIN SODIUM (PORCINE) 1000 UNIT/ML IJ SOLN
INTRAMUSCULAR | Status: DC | PRN
  Administered 2023-06-06: 3500 [IU] via INTRAVENOUS

## 2023-06-06 MED ORDER — RANOLAZINE ER 500 MG PO TB12: 500.0000 mg | ORAL_TABLET | Freq: Two times a day (BID) | ORAL | 3 refills | Status: DC

## 2023-06-06 MED ORDER — FENTANYL CITRATE (PF) 100 MCG/2ML IJ SOLN
INTRAMUSCULAR | Status: AC
  Filled 2023-06-06: qty 2

## 2023-06-06 MED ORDER — LIDOCAINE HCL (PF) 1 % IJ SOLN
INTRAMUSCULAR | Status: AC
  Filled 2023-06-06: qty 30

## 2023-06-06 MED ORDER — SODIUM CHLORIDE 0.9 % IV SOLN: 250.0000 mL | INTRAVENOUS | Status: DC | PRN

## 2023-06-06 MED ORDER — HYDRALAZINE HCL 20 MG/ML IJ SOLN: 10.0000 mg | INTRAMUSCULAR | Status: DC | PRN

## 2023-06-06 MED ORDER — HEPARIN SODIUM (PORCINE) 1000 UNIT/ML IJ SOLN
INTRAMUSCULAR | Status: AC
  Filled 2023-06-06: qty 10

## 2023-06-06 MED ORDER — VERAPAMIL HCL 2.5 MG/ML IV SOLN
INTRAVENOUS | Status: AC
  Filled 2023-06-06: qty 2

## 2023-06-06 MED ORDER — HEPARIN (PORCINE) IN NACL 1000-0.9 UT/500ML-% IV SOLN
INTRAVENOUS | Status: DC | PRN
  Administered 2023-06-06 (×2): 500 mL

## 2023-06-06 MED ORDER — SODIUM CHLORIDE 0.9% FLUSH: 3.0000 mL | Freq: Two times a day (BID) | INTRAVENOUS | Status: DC

## 2023-06-06 MED ORDER — FENTANYL CITRATE (PF) 100 MCG/2ML IJ SOLN
INTRAMUSCULAR | Status: DC | PRN
  Administered 2023-06-06: 25 ug via INTRAVENOUS

## 2023-06-06 MED ORDER — VERAPAMIL HCL 2.5 MG/ML IV SOLN
INTRAVENOUS | Status: DC | PRN
  Administered 2023-06-06: 10 mL via INTRA_ARTERIAL

## 2023-06-06 MED ORDER — IOHEXOL 350 MG/ML SOLN
INTRAVENOUS | Status: DC | PRN
  Administered 2023-06-06: 45 mL

## 2023-06-06 MED ORDER — MIDAZOLAM HCL 2 MG/2ML IJ SOLN
INTRAMUSCULAR | Status: AC
  Filled 2023-06-06: qty 2

## 2023-06-06 MED ORDER — ASPIRIN 81 MG PO CHEW
81.0000 mg | CHEWABLE_TABLET | ORAL | Status: DC
Start: 2023-06-07 — End: 2023-06-06

## 2023-06-06 MED ORDER — ASPIRIN 81 MG PO CHEW: 81.0000 mg | CHEWABLE_TABLET | ORAL | Status: DC

## 2023-06-06 MED ORDER — MIDAZOLAM HCL 2 MG/2ML IJ SOLN
INTRAMUSCULAR | Status: DC | PRN
  Administered 2023-06-06: 1 mg via INTRAVENOUS

## 2023-06-06 SURGICAL SUPPLY — 9 items
CATH INFINITI AMBI 5FR TG (CATHETERS) IMPLANT
DEVICE RAD COMP TR BAND LRG (VASCULAR PRODUCTS) IMPLANT
GLIDESHEATH SLEND SS 6F .021 (SHEATH) IMPLANT
GUIDEWIRE INQWIRE 1.5J.035X260 (WIRE) IMPLANT
INQWIRE 1.5J .035X260CM (WIRE) ×1
KIT HEART LEFT (KITS) ×1 IMPLANT
PACK CARDIAC CATHETERIZATION (CUSTOM PROCEDURE TRAY) ×1 IMPLANT
TRANSDUCER W/STOPCOCK (MISCELLANEOUS) ×1 IMPLANT
TUBING CIL FLEX 10 FLL-RA (TUBING) ×1 IMPLANT

## 2023-06-06 NOTE — Discharge Instructions (Signed)

## 2023-06-06 NOTE — Interval H&P Note (Signed)
History and Physical Interval Note:  06/06/2023 7:32 AM  Noah Grant  has presented today for surgery, with the diagnosis of angina.  The various methods of treatment have been discussed with the patient and family. After consideration of risks, benefits and other options for treatment, the patient has consented to  Procedure(s): LEFT HEART CATH AND CORONARY ANGIOGRAPHY (N/A) PERCUTANEOUS CORONARY INTERVENTION  as a surgical intervention.  The patient's history has been reviewed, patient examined, no change in status, stable for surgery.  I have reviewed the patient's chart and labs.  Questions were answered to the patient's satisfaction.    Cath Lab Visit (complete for each Cath Lab visit)  Clinical Evaluation Leading to the Procedure:   ACS: No.  Non-ACS:    Anginal Classification: CCS III  Anti-ischemic medical therapy: Minimal Therapy (1 class of medications)  Non-Invasive Test Results: No non-invasive testing performed  Prior CABG: No previous CABG    Bryan Lemma

## 2023-06-06 NOTE — Progress Notes (Signed)
TR BAND REMOVAL  LOCATION:    right Ulnar  DEFLATED PER PROTOCOL:    Yes.    TIME BAND OFF / DRESSING APPLIED:    1025 gauze dressing applied   SITE UPON ARRIVAL:    Level 0  SITE AFTER BAND REMOVAL:    Level 0  CIRCULATION SENSATION AND MOVEMENT:    Within Normal Limits   Yes.    COMMENTS:   no issues noted

## 2023-06-09 ENCOUNTER — Encounter (HOSPITAL_COMMUNITY): Payer: Self-pay | Admitting: Cardiology

## 2023-06-10 DIAGNOSIS — N1832 Chronic kidney disease, stage 3b: Secondary | ICD-10-CM | POA: Diagnosis not present

## 2023-06-13 ENCOUNTER — Other Ambulatory Visit: Payer: Self-pay | Admitting: Cardiology

## 2023-06-13 ENCOUNTER — Emergency Department (HOSPITAL_BASED_OUTPATIENT_CLINIC_OR_DEPARTMENT_OTHER)
Admission: EM | Admit: 2023-06-13 | Discharge: 2023-06-13 | Disposition: A | Discharge status: Home / Self Care | Payer: PPO | Attending: Emergency Medicine | Admitting: Emergency Medicine

## 2023-06-13 ENCOUNTER — Other Ambulatory Visit: Payer: Self-pay

## 2023-06-13 ENCOUNTER — Emergency Department (HOSPITAL_BASED_OUTPATIENT_CLINIC_OR_DEPARTMENT_OTHER): Payer: PPO

## 2023-06-13 ENCOUNTER — Encounter (HOSPITAL_BASED_OUTPATIENT_CLINIC_OR_DEPARTMENT_OTHER): Payer: Self-pay

## 2023-06-13 DIAGNOSIS — M7989 Other specified soft tissue disorders: Secondary | ICD-10-CM | POA: Diagnosis not present

## 2023-06-13 DIAGNOSIS — I721 Aneurysm of artery of upper extremity: Secondary | ICD-10-CM

## 2023-06-13 DIAGNOSIS — R21 Rash and other nonspecific skin eruption: Secondary | ICD-10-CM | POA: Insufficient documentation

## 2023-06-13 DIAGNOSIS — I729 Aneurysm of unspecified site: Secondary | ICD-10-CM

## 2023-06-13 DIAGNOSIS — Z7901 Long term (current) use of anticoagulants: Secondary | ICD-10-CM | POA: Insufficient documentation

## 2023-06-13 DIAGNOSIS — M25531 Pain in right wrist: Secondary | ICD-10-CM | POA: Diagnosis not present

## 2023-06-13 MED ORDER — TRIAMCINOLONE ACETONIDE 0.1 % EX CREA: 1.0000 | TOPICAL_CREAM | Freq: Two times a day (BID) | CUTANEOUS | 0 refills | Status: DC

## 2023-06-13 NOTE — ED Triage Notes (Signed)
Patient had a cardiac cath done on Friday morning. He has some swelling and bruising to right wrist. He stated the pulse has moved on his wrist. He also had a bug bite to the right lower leg.

## 2023-06-13 NOTE — Discharge Instructions (Signed)
Please keep the pressure bandage on for the next few days.  Follow-up with your cardiology team.  Return to the emergency department if any worsening or concerning symptoms.  We are also prescribing some steroid cream to use on the rash on the back of your leg.

## 2023-06-13 NOTE — Progress Notes (Signed)
    Patient s/p LHC on 06/06/23 with Dr. Herbie Baltimore seen at [facility] with right wrist pain/swelling. 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 Per Dr. Herbie Baltimore, patient needs arterial US at [facility] "early in the week of 7/8." Order has been placed.   Perlie Gold, PA-C

## 2023-06-13 NOTE — ED Provider Notes (Signed)
[place] [facility] Provider Note   CSN: [id number] Arrival date & time: 06/13/23  4098     History {Add pertinent medical, surgical, social history, OB history to HPI:1} Chief Complaint  Patient presents with   Arm Problem    Noah Grant is a 72 y.o. male.  He had a cardiac cath done about a week ago with a right ulnar approach by Dr. Herbie Baltimore.  He is on Eliquis.  He said there was some bruising at the site afterwards but it was okay but for the last few days it has been more swollen and tender.  No new trauma.  No fevers chest pain.  He has some chronic rheumatologic conditions so always has numbness and tingling but does not feel it is changed at all, no weakness.  He is also had some sort of bug bite behind his right knee that is been very itchy and he has been trying hydrocortisone Neosporin and topical Benadryl and it still itchy and he also wants that looked at.  The history is provided by the patient.  Wrist Pain This is a new problem. The current episode started more than 2 days ago. The problem occurs constantly. The problem has been gradually worsening. Pertinent negatives include no chest pain, no abdominal pain, no headaches and no shortness of breath. The symptoms are aggravated by bending and twisting. Nothing relieves the symptoms. He has tried rest for the symptoms. The treatment provided no relief.       Home Medications Prior to Admission medications   Medication Sig Start Date End Date Taking? Authorizing Provider  acetaminophen (TYLENOL) 650 MG CR tablet Take 1,300 mg by mouth every 8 (eight) hours as needed for pain.    [provider]  amLODipine (NORVASC) 5 MG tablet Take 2 tablets (10 mg total) by mouth daily. 02/24/23   Sharlene Dory, DO  Cholecalciferol 50 MCG (2000 UT) CAPS Take 2,000 Units by mouth daily.     [provider]  Coenzyme Q10 (COQ10) 200 MG CAPS Take 200 mg by mouth daily.     [provider]  diclofenac Sodium (VOLTAREN) 1 % GEL Apply 1 application  topically daily as needed for pain.    [provider]  ELIQUIS 5 MG TABS tablet TAKE 1 TABLET BY MOUTH TWICE A DAY 03/25/23   [name], Jilda Roche, DO  ezetimibe (ZETIA) 10 MG tablet Take 1 tablet (10 mg total) by mouth daily. 09/30/22   Revankar, Aundra Dubin, MD  finasteride (PROSCAR) 5 MG tablet TAKE 1 TABLET BY MOUTH DAILY 01/13/23   Sharlene Dory, DO  hydrALAZINE (APRESOLINE) 50 MG tablet Take 50 mg by mouth 3 (three) times daily.    [provider]  leflunomide (ARAVA) 20 MG tablet TAKE 1 TABLET BY MOUTH DAILY 04/21/23   Rice, Jamesetta Orleans, MD  lithium carbonate 150 MG capsule Take 3 capsules (450 mg total) by mouth daily. 05/07/23   Cottle, Steva Ready., MD  losartan (COZAAR) 100 MG tablet Take 100 mg by mouth daily.    [provider]  Lysine HCl 1000 MG TABS Take 1,000 mg by mouth 2 (two) times daily.     [provider]  nitroGLYCERIN (NITROSTAT) 0.4 MG SL tablet Place 1 tablet (0.4 mg total) under the tongue every 5 (five) minutes as needed for chest pain. 04/04/23   Revankar, Aundra Dubin, MD  omeprazole (PRILOSEC) 40 MG capsule Take 1 capsule (40 mg total)  by mouth in the morning and at bedtime. 02/24/23   Sharlene Dory, DO  ranolazine (RANEXA) 500 MG 12 hr tablet Take 1 tablet (500 mg total) by mouth 2 (two) times daily. 06/06/23   Marykay Lex, MD  simvastatin (ZOCOR) 5 MG tablet TAKE ONE TABLET BY MOUTH DAILY 09/30/22   Sharlene Dory, DO  vitamin B-12 (CYANOCOBALAMIN) 1000 MCG tablet Take 1,000 mcg by mouth daily.    [provider]      Allergies    Methotrexate, Nsaids, Plaquenil [hydroxychloroquine], Azulfidine [sulfasalazine], Crestor [rosuvastatin], Iodinated contrast media, Isosorbide, Mevacor [lovastatin], and Pravachol [pravastatin]    Review of Systems   Review of Systems  Constitutional:  Negative for fever.  Respiratory:   Negative for shortness of breath.   Cardiovascular:  Negative for chest pain.  Gastrointestinal:  Negative for abdominal pain.  Skin:  Positive for rash and wound.  Neurological:  Negative for headaches.    Physical Exam Updated Vital Signs BP (!) 171/68   Pulse (!) 59   Temp 97.9 F (36.6 C) (Oral)   Resp 16   Ht 5\' 7"  (1.702 m)   Wt 73.5 kg   SpO2 100%   BMI 25.37 kg/m  Physical Exam Vitals and nursing note reviewed.  Constitutional:      General: He is not in acute distress.    Appearance: Normal appearance. He is well-developed.  HENT:     Head: Normocephalic and atraumatic.  Eyes:     Conjunctiva/sclera: Conjunctivae normal.  Cardiovascular:     Rate and Rhythm: Normal rate and regular rhythm.     Heart sounds: No murmur heard. Pulmonary:     Effort: Pulmonary effort is normal. No respiratory distress.     Breath sounds: Normal breath sounds.  Abdominal:     Palpations: Abdomen is soft.     Tenderness: There is no abdominal tenderness.  Musculoskeletal:        General: Swelling and tenderness present.     Cervical back: Neck supple.     Comments: Right arm he has some bruising through his forearm.  There is a palpable radial pulse.  There is a very prominent ulnar pulse.  Distal cap refill motor and sensory intact.  Forearm compartments are soft.  Skin:    General: Skin is warm and dry.     Capillary Refill: Capillary refill takes less than 2 seconds.  Neurological:     General: No focal deficit present.     Mental Status: He is alert.     Motor: No weakness.     Gait: Gait normal.     ED Results / Procedures / Treatments   Labs (all labs ordered are listed, but only abnormal results are displayed) Labs Reviewed - No data to display  EKG None  Radiology No results found.  Procedures Procedures  {Document cardiac monitor, telemetry assessment procedure when appropriate:1}  Medications Ordered in ED Medications - No data to display  ED Course/  Medical Decision Making/ A&P   {   Click here for ABCD2, HEART and other calculatorsREFRESH Note before signing :1}                          Medical Decision Making  This patient complains of ***; this involves an extensive number of treatment Options and is a complaint that carries with it a high risk of complications and morbidity. The differential includes ***  I ordered, reviewed and  interpreted labs, which included *** I ordered medication *** and reviewed PMP when indicated. I ordered imaging studies which included *** and I independently    visualized and interpreted imaging which showed *** Additional history obtained from *** Previous records obtained and reviewed *** I consulted *** and discussed lab and imaging findings and discussed disposition.  Cardiac monitoring reviewed, *** Social determinants considered, *** Critical Interventions: ***  After the interventions stated above, I reevaluated the patient and found *** Admission and further testing considered, ***   {Document critical care time when appropriate:1} {Document review of labs and clinical decision tools ie heart score, Chads2Vasc2 etc:1}  {Document your independent review of radiology images, and any outside records:1} {Document your discussion with family members, caretakers, and with consultants:1} {Document social determinants of health affecting pt's care:1} {Document your decision making why or why not admission, treatments were needed:1} Final Clinical Impression(s) / ED Diagnoses Final diagnoses:  None    Rx / DC Orders ED Discharge Orders     None

## 2023-06-16 ENCOUNTER — Telehealth: Payer: Self-pay | Admitting: Cardiology

## 2023-06-16 NOTE — Telephone Encounter (Signed)
Spoke with patient. Scheduled for visit with Wynema Birch PA on 7/12 @ 9:15am. There is a currently scheduled vascular test at 8am for patient, and he is aware he may need this, but there was already a upper ext doppler done at [facility] on 7/5   Will defer to Skyline PA to see if another test is needed. Patient will be contacted accordingly.

## 2023-06-16 NOTE — Telephone Encounter (Signed)
-----   Message from Perlie Gold, PA-C sent at 06/13/2023 12:30 PM EDT ----- Regarding: Arterial US and APP appt needed Hey all,  Patient s/p LHC on 06/06/23 with Dr. Herbie Baltimore seen at [facility] today with right wrist pain/swelling. 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 Per Dr. Herbie Baltimore, patient needs arterial US at [facility] "early in the week of 7/8" ideally with APP appt the same day following ultrasound. Order has been placed for the study but I need help with appt for the [place]. Also have not scheduled APP appt not knowing when the ultrasound would be scheduled.   Thanks so much!  Perlie Gold, PA-C

## 2023-06-17 DIAGNOSIS — N1832 Chronic kidney disease, stage 3b: Secondary | ICD-10-CM | POA: Diagnosis not present

## 2023-06-17 DIAGNOSIS — N138 Other obstructive and reflux uropathy: Secondary | ICD-10-CM | POA: Diagnosis not present

## 2023-06-17 DIAGNOSIS — R0989 Other specified symptoms and signs involving the circulatory and respiratory systems: Secondary | ICD-10-CM | POA: Diagnosis not present

## 2023-06-17 DIAGNOSIS — M069 Rheumatoid arthritis, unspecified: Secondary | ICD-10-CM | POA: Diagnosis not present

## 2023-06-17 DIAGNOSIS — N401 Enlarged prostate with lower urinary tract symptoms: Secondary | ICD-10-CM | POA: Diagnosis not present

## 2023-06-18 NOTE — Telephone Encounter (Signed)
Per Boykin PA, repeat UE arterial doppler is needed. LM for patient on name-verified VM with this info.

## 2023-06-20 ENCOUNTER — Ambulatory Visit (HOSPITAL_BASED_OUTPATIENT_CLINIC_OR_DEPARTMENT_OTHER)
Admission: RE | Admit: 2023-06-20 | Discharge: 2023-06-20 | Disposition: A | Discharge status: Home / Self Care | Payer: PPO | Source: Ambulatory Visit | Attending: Cardiovascular Disease | Admitting: Cardiovascular Disease

## 2023-06-20 ENCOUNTER — Telehealth: Payer: Self-pay

## 2023-06-20 ENCOUNTER — Encounter: Payer: Self-pay | Admitting: Physician Assistant

## 2023-06-20 ENCOUNTER — Other Ambulatory Visit: Payer: Self-pay

## 2023-06-20 ENCOUNTER — Ambulatory Visit (HOSPITAL_COMMUNITY)
Admission: AD | Admit: 2023-06-20 | Discharge: 2023-06-20 | Disposition: A | Discharge status: Nursing Home (Includes ICF) | Payer: PPO | Source: Ambulatory Visit | Attending: Vascular Surgery | Admitting: Vascular Surgery

## 2023-06-20 ENCOUNTER — Encounter (HOSPITAL_COMMUNITY): Payer: Self-pay | Admitting: Vascular Surgery

## 2023-06-20 ENCOUNTER — Emergency Department (HOSPITAL_COMMUNITY)
Admission: EM | Admit: 2023-06-20 | Discharge: 2023-06-20 | Disposition: A | Discharge status: Home / Self Care | Payer: PPO | Attending: Student | Admitting: Student

## 2023-06-20 ENCOUNTER — Encounter (HOSPITAL_COMMUNITY): Payer: Self-pay

## 2023-06-20 ENCOUNTER — Ambulatory Visit (INDEPENDENT_AMBULATORY_CARE_PROVIDER_SITE_OTHER): Payer: PPO | Admitting: Physician Assistant

## 2023-06-20 ENCOUNTER — Encounter (HOSPITAL_COMMUNITY)
Admission: AD | Disposition: A | Discharge status: Nursing Home (Includes ICF) | Payer: Self-pay | Source: Ambulatory Visit | Attending: Vascular Surgery

## 2023-06-20 VITALS — BP 140/72 | HR 51 | Ht 67.0 in | Wt 162.2 lb

## 2023-06-20 DIAGNOSIS — I721 Aneurysm of artery of upper extremity: Secondary | ICD-10-CM | POA: Diagnosis not present

## 2023-06-20 DIAGNOSIS — I480 Paroxysmal atrial fibrillation: Secondary | ICD-10-CM | POA: Insufficient documentation

## 2023-06-20 DIAGNOSIS — I728 Aneurysm of other specified arteries: Secondary | ICD-10-CM

## 2023-06-20 DIAGNOSIS — E782 Mixed hyperlipidemia: Secondary | ICD-10-CM | POA: Diagnosis not present

## 2023-06-20 DIAGNOSIS — R001 Bradycardia, unspecified: Secondary | ICD-10-CM | POA: Insufficient documentation

## 2023-06-20 DIAGNOSIS — I10 Essential (primary) hypertension: Secondary | ICD-10-CM | POA: Insufficient documentation

## 2023-06-20 DIAGNOSIS — Z7901 Long term (current) use of anticoagulants: Secondary | ICD-10-CM | POA: Insufficient documentation

## 2023-06-20 DIAGNOSIS — Z79899 Other long term (current) drug therapy: Secondary | ICD-10-CM | POA: Insufficient documentation

## 2023-06-20 DIAGNOSIS — E785 Hyperlipidemia, unspecified: Secondary | ICD-10-CM | POA: Insufficient documentation

## 2023-06-20 DIAGNOSIS — I129 Hypertensive chronic kidney disease with stage 1 through stage 4 chronic kidney disease, or unspecified chronic kidney disease: Secondary | ICD-10-CM | POA: Diagnosis not present

## 2023-06-20 DIAGNOSIS — N183 Chronic kidney disease, stage 3 unspecified: Secondary | ICD-10-CM | POA: Insufficient documentation

## 2023-06-20 DIAGNOSIS — I25118 Atherosclerotic heart disease of native coronary artery with other forms of angina pectoris: Secondary | ICD-10-CM | POA: Insufficient documentation

## 2023-06-20 DIAGNOSIS — I729 Aneurysm of unspecified site: Secondary | ICD-10-CM

## 2023-06-20 DIAGNOSIS — N1831 Chronic kidney disease, stage 3a: Secondary | ICD-10-CM | POA: Insufficient documentation

## 2023-06-20 DIAGNOSIS — T81718A Complication of other artery following a procedure, not elsewhere classified, initial encounter: Secondary | ICD-10-CM

## 2023-06-20 DIAGNOSIS — Z87891 Personal history of nicotine dependence: Secondary | ICD-10-CM | POA: Diagnosis not present

## 2023-06-20 DIAGNOSIS — I2510 Atherosclerotic heart disease of native coronary artery without angina pectoris: Secondary | ICD-10-CM | POA: Insufficient documentation

## 2023-06-20 SURGERY — THROMBECTOMY AND REVISION OF ARTERIOVENTOUS (AV) GORETEX  GRAFT
Anesthesia: Choice

## 2023-06-20 NOTE — Consult Note (Signed)
Hospital Consult    Reason for Consult: Right ulnar pseudoaneurysm Requesting Physician: ED MRN #:  [id number]  History of Present Illness: This is a 72 y.o. male status post cardiac cath on 06/06/2023 with iatrogenic right ulnar artery pseudoaneurysm.  He was recently seen at [facility] with interval growth, and vascular surgery was called for further recommendations.  I asked that he present to [facility] for evaluation.  On exam, Noah Grant was doing well.  He noted tenderness at the pseudoaneurysm site, but no unilateral numbness tingling or motor dysfunction.  Noah Grant has rheumatoid arthritis at baseline, and has pain in bilateral hands. The aneurysm is grown from 2 cm to 3 cm.  Noah Grant is on Eliquis for intermittent A-fib  Past Medical History:  Diagnosis Date   Abdominal aortic atherosclerosis (HCC)    Anemia    likely of chronic disease   Angina pectoris (HCC) 08/30/2020   Arthritis    Atrial fibrillation (HCC)    Atrial fibrillation with rapid ventricular response (HCC) 05/12/2022   Barrett's esophagus without dysplasia 03/25/2019   Formatting of this note might be different from the original. EGD done in 02/2019. Due in 3 years.   Benign prostatic hyperplasia with nocturia 12/29/2018   Bipolar 1 disorder (HCC)    Bipolar 1 disorder, mixed, moderate (HCC) 08/16/2014   Bipolar 2 disorder (HCC) 08/16/2014   Bipolar affective disorder, currently depressed, moderate (HCC) 03/08/2020   Bipolar depression (HCC) 08/16/2014   BPH with urinary obstruction 12/29/2018   Brain ischemia 03/08/2020   Cardiac murmur 08/30/2020   Chest pain 09/06/2020   Chicken pox    Chronic bronchitis (HCC)    Chronic kidney disease, stage 3a (HCC) 07/21/2019   Cognitive complaints 03/08/2020   Coronary artery disease involving native coronary artery of native heart 09/06/2020   Coronary artery disease involving native coronary artery of native heart with angina pectoris (HCC) 09/06/2020   Depression     Disc degeneration, 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 Last Assessment & Plan:  Well controlled. Continue current medications.   GERD without esophagitis 08/16/2014   Formatting of this note might be different from the original. Last Assessment & Plan:  Well controlled. Continue current medications.   Herpes gingivostomatitis 02/10/2015   High risk medication use 03/23/2018   History of colon polyps 03/25/2019   Formatting of this note might be different from the original. tubular adenoma   History of stroke 11/10/2019   Hyperlipidemia    Hyperphosphatemia 10/30/2022   Hypertension    Ingrowing nail 03/12/2020   Intention tremor 06/16/2019   Labile hypertension 08/16/2014   Formatting of this note might be different from the original. Last Assessment & Plan:  Slightly above goal today secondary to pain. Will continue current regimen. Will check CMP today.   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 Concern over possible lichen planus. His dentist noticed lesions of his oral mucosa.  It was felt to be lichen planus.  He presents to discuss further.  Rarely does the area hurt.He smoked in the distant past EXAM shows several white patches on the buccal mucosa bilaterally.  No other concerning oral mucosal les  Lithium use 10/30/2022   Long-term use of immunosuppressant medication 03/23/2018   Low testosterone 09/17/2016   Midline thoracic back pain 02/10/2015   Mixed hyperlipidemia 08/16/2014   Myocarditis (HCC) 05/14/2022   Normochromic normocytic anemia 08/16/2014   Normocytic anemia    likely of chronic disease   PAD  (peripheral artery disease) (HCC) 03/23/2019   Pain of right hip joint 01/19/2016   Pericarditis 05/12/2022   Rheumatic fever    Rheumatoid arthritis (HCC) 10/29/2018   Formatting of this note might be different from the original. Rheum:  Dr. Sharmon Revere Formerly on MTX - stopped due to mouth sores. Currently started on Arava.  Also on 5 mg prednisone x 3 months during run-in.   Right-sided chest wall pain 04/17/2019   Seasonal allergies 03/23/2018   Sepsis (HCC) 05/11/2022   Stasis dermatitis of both legs 07/29/2022   Statin intolerance 07/31/2020   Task-specific dystonia of hand    right hand   Thoracic radiculopathy 06/16/2019   Vitamin B12 deficiency    Injections   Vitamin D deficiency 09/17/2016    Past Surgical History:  Procedure Laterality Date   CLAVICLE SURGERY     Right, Hardware placed   CORONARY STENT INTERVENTION N/A 09/06/2020   Procedure: CORONARY STENT INTERVENTION;  Surgeon: Tonny Bollman, MD;  Location: [facility] INVASIVE CV LAB;  Service: Cardiovascular;  Laterality: N/A;   ingrown toenail Bilateral    great toes   LEFT HEART CATH AND CORONARY ANGIOGRAPHY N/A 09/06/2020   Procedure: LEFT HEART CATH AND CORONARY ANGIOGRAPHY;  Surgeon: Tonny Bollman, MD;  Location: [facility] INVASIVE CV LAB;  Service: Cardiovascular;  Laterality: N/A;   LEFT HEART CATH AND CORONARY ANGIOGRAPHY N/A 06/06/2023   Procedure: LEFT HEART CATH AND CORONARY ANGIOGRAPHY;  Surgeon: Marykay Lex, MD;  Location: [facility] INVASIVE CV LAB;  Service: Cardiovascular;  Laterality: N/A;   WISDOM TOOTH EXTRACTION      Allergies  Allergen Reactions   Methotrexate Dermatitis    Developed Mouth Sores   Nsaids Dermatitis    Developed Mouth Blisters   Plaquenil [Hydroxychloroquine] Other (See Comments)    Terrible Nightmares   Azulfidine [Sulfasalazine] Nausea Only   Crestor [Rosuvastatin] Other (See Comments)    Caused abdominal pain that radiated to the back   Iodinated Contrast Media     GI Intolerance,  Made him severely sick   Isosorbide Other (See Comments)    Made him feel weak   Mevacor [Lovastatin] Other (See Comments)    Caused abdominal pain that radiated to the back   Pravachol [Pravastatin] Other (See Comments)    Caused abdominal pain that radiated to the back    Prior to Admission medications   Medication Sig Start Date End Date Taking? Authorizing Provider  acetaminophen (TYLENOL) 650 MG CR tablet Take 1,300 mg by mouth every 8 (eight) hours as needed for pain.    [provider]  amLODipine (NORVASC) 5 MG tablet Take 2 tablets (10 mg total) by mouth daily. 02/24/23   Sharlene Dory, DO  Cholecalciferol 50 MCG (2000 UT) CAPS Take 2,000 Units by mouth daily.     [provider]  Coenzyme Q10 (COQ10) 200 MG CAPS Take 200 mg by mouth daily.    [provider]  diclofenac Sodium (VOLTAREN) 1 % GEL Apply 1 application  topically daily as needed for pain.    [provider]  ELIQUIS 5 MG TABS tablet TAKE 1 TABLET BY MOUTH TWICE A DAY 03/25/23   [name], Jilda Roche,  DO  ezetimibe (ZETIA) 10 MG tablet Take 1 tablet (10 mg total) by mouth daily. 09/30/22   Revankar, Aundra Dubin, MD  finasteride (PROSCAR) 5 MG tablet TAKE 1 TABLET BY MOUTH DAILY 01/13/23   Sharlene Dory, DO  hydrALAZINE (APRESOLINE) 50 MG tablet Take 50 mg by mouth 3 (three) times daily.    [provider]  leflunomide (ARAVA) 20 MG tablet TAKE 1 TABLET BY MOUTH DAILY 04/21/23   Rice, Jamesetta Orleans, MD  lithium carbonate 150 MG capsule Take 3 capsules (450 mg total) by mouth daily. 05/07/23   Cottle, Steva Ready., MD  losartan (COZAAR) 100 MG tablet Take 100 mg by mouth daily.    [provider]  Lysine HCl 1000 MG TABS Take 1,000 mg by mouth 2 (two) times daily.     [provider]  nitroGLYCERIN (NITROSTAT) 0.4 MG SL tablet Place 1 tablet (0.4 mg total) under the tongue every 5 (five) minutes as needed for chest pain. 04/04/23   Revankar, Aundra Dubin,  MD  omeprazole (PRILOSEC) 40 MG capsule Take 1 capsule (40 mg total) by mouth in the morning and at bedtime. 02/24/23   Sharlene Dory, DO  simvastatin (ZOCOR) 5 MG tablet TAKE ONE TABLET BY MOUTH DAILY 09/30/22   Sharlene Dory, DO  triamcinolone cream (KENALOG) 0.1 % Apply 1 Application topically 2 (two) times daily. 06/13/23   Terrilee Files, MD  vitamin B-12 (CYANOCOBALAMIN) 1000 MCG tablet Take 1,000 mcg by mouth daily.    [provider]    Social History   Socioeconomic History   Marital status: Married    Spouse name: Not on file   Number of children: Not on file   Years of education: Not on file   Highest education level: 12th grade  Occupational History   Not on file  Tobacco Use   Smoking status: Former    Current packs/day: 0.00    Average packs/day: 2.0 packs/day for 10.0 years (20.0 ttl pk-yrs)    Types: Cigarettes    Start date: 67    Quit date: 33    Years since quitting: 50.5    Passive exposure: Past   Smokeless tobacco: Never   Tobacco comments:    Quit>40 years  Vaping Use   Vaping status: Never Used  Substance and Sexual Activity   Alcohol use: Not Currently    Alcohol/week: 0.0 standard drinks of alcohol   Drug use: Never   Sexual activity: Not on file  Other Topics Concern   Not on file  Social History Narrative   Not on file   Social Determinants of Health   Financial Resource Strain: Low Risk  (03/20/2023)   Overall Financial Resource Strain (CARDIA)    Difficulty of Paying Living Expenses: Not hard at all  Food Insecurity: No Food Insecurity (03/20/2023)   Hunger Vital Sign    Worried About Running Out of Food in the Last Year: Never true    Ran Out of Food in the Last Year: Never true  Transportation Needs: No Transportation Needs (03/20/2023)   PRAPARE - Administrator, Civil Service (Medical): No    Lack of Transportation (Non-Medical): No  Physical Activity: Inactive (03/20/2023)   Exercise  Vital Sign    Days of Exercise per Week: 0 days    Minutes of Exercise per Session: 0 min  Stress: Stress Concern Present (03/20/2023)   [employer] of Occupational Health - Occupational Stress Questionnaire  Feeling of Stress : Rather much  Social Connections: Unknown (03/20/2023)   Social Connection and Isolation Panel [NHANES]    Frequency of Communication with Friends and Family: Never    Frequency of Social Gatherings with Friends and Family: Patient declined    Attends Religious Services: Never    Database administrator or Organizations: No    Attends Engineer, structural: Never    Marital Status: Married  Recent Concern: Social Connections - Socially Isolated (01/22/2023)   Social Connection and Isolation Panel [NHANES]    Frequency of Communication with Friends and Family: Once a week    Frequency of Social Gatherings with Friends and Family: Once a week    Attends Religious Services: Never    Database administrator or Organizations: No    Attends Banker Meetings: Never    Marital Status: Married  Catering manager Violence: Not At Risk (01/22/2023)   Humiliation, Afraid, Rape, and Kick questionnaire    Fear of Current or Ex-Partner: No    Emotionally Abused: No    Physically Abused: No    Sexually Abused: No   Family History  Problem Relation Age of Onset   Alzheimer's disease Mother 53       Deceased in early 18s   Stroke Father 87       Deceased   Hypertension Father    Alcoholism Father    Alcoholism Brother    Heart disease Maternal Grandfather 44       Deceased   Heart disease Paternal Grandfather 71       Deceased   Hypertension Son     ROS: Otherwise negative unless mentioned in HPI  Physical Examination  Vitals:   06/20/23 1215 06/20/23 1230  BP: (!) 174/72 (!) 186/65  Pulse: (!) 49 (!) 51  Resp: 14 13  Temp:    SpO2: 100% 100%   There is no height or weight on file to calculate BMI.  General:  WDWN in NAD Gait:  Not observed HENT: WNL, normocephalic Pulmonary: normal non-labored breathing, without Rales, rhonchi,  wheezing Cardiac: regular Abdomen:  soft, NT/ND, no masses Skin: without rashes Vascular Exam/Pulses: 2+ radial, 2+ ulnar pulses, palpable mass in the right wrist at the site of the ulnar artery. Extremities: without ischemic changes, without Gangrene , without cellulitis; without open wounds;  Musculoskeletal: no muscle wasting or atrophy  Neurologic: A&O X 3;  No focal weakness or paresthesias are detected; speech is fluent/normal Psychiatric:  The pt has Normal affect. Lymph:  Unremarkable  CBC    Component Value Date/Time   WBC 5.3 06/03/2023 1019   WBC 4.9 03/31/2023 1200   RBC 3.94 (L) 06/03/2023 1019   RBC 3.63 (L) 03/31/2023 1200   HGB 9.9 (L) 06/06/2023 0823   HGB 11.4 (L) 06/03/2023 1019   HCT 29.0 (L) 06/06/2023 0823   HCT 35.8 (L) 06/03/2023 1019   PLT 230 06/03/2023 1019   MCV 91 06/03/2023 1019   MCH 28.9 06/03/2023 1019   MCH 30.6 03/31/2023 1200   MCHC 31.8 06/03/2023 1019   MCHC 33.3 03/31/2023 1200   RDW 13.8 06/03/2023 1019   LYMPHSABS 568 (L) 03/31/2023 1200   LYMPHSABS 0.5 (L) 08/30/2020 1139   MONOABS 1.3 (H) 05/12/2022 0201   EOSABS 201 03/31/2023 1200   EOSABS 0.3 08/30/2020 1139   BASOSABS 49 03/31/2023 1200   BASOSABS 0.1 08/30/2020 1139    BMET    Component Value Date/Time   NA 141 06/06/2023  1610   NA 142 06/03/2023 1019   K 3.9 06/06/2023 0823   CL 111 06/06/2023 0823   CO2 24 06/03/2023 1019   GLUCOSE 136 (H) 06/06/2023 0823   BUN 22 06/06/2023 0823   BUN 20 06/03/2023 1019   CREATININE 1.30 (H) 06/06/2023 0823   CREATININE 1.36 (H) 03/31/2023 1200   CALCIUM 9.5 06/03/2023 1019   GFRNONAA 59 (L) 05/15/2022 0306   GFRAA 75 11/28/2020 0841    COAGS: Lab Results  Component Value Date   INR 1.3 (H) 05/11/2022     ASSESSMENT/PLAN: This is a 72 y.o. male with iatrogenic right ulnar artery pseudoaneurysm status postcardiac cath  on 06/06/2023.  The pseudoaneurysm is increased in size.  He would benefit from operative repair, however he is currently on Eliquis.  My plan is to primarily repair the ulnar artery pseudoaneurysm in the operating room Monday once his Eliquis has washed out.  He will come in as an outpatient for elective surgery.  After discussing the risk and benefits, Noah Grant elected to proceed.  Fara Olden MD [facility] [phone] 06/20/2023  1:05 PM

## 2023-06-20 NOTE — ED Notes (Signed)
Pt did take all of his morning medication this am including his Eliquis

## 2023-06-20 NOTE — Patient Instructions (Addendum)
Medication Instructions:  STOP THE USE OF THE ELIQUIS MEDICATION IMMEDIATELY UNTIL FURTHER NOTICE. DO NOT EAT.  GO TO [place] TODAY IMMEDIATELY.   *If you need a refill on your cardiac medications before your next appointment, please call your pharmacy*   Lab Work: NONE If you have labs (blood work) drawn today and your tests are completely normal, you will receive your results only by: MyChart Message (if you have MyChart) OR A paper copy in the mail If you have any lab test that is abnormal or we need to change your treatment, we will call you to review the results.   Testing/Procedures: NONE   Follow-Up: At [facility], you and your health needs are our priority.  As part of our continuing mission to provide you with exceptional heart care, we have created designated Provider Care Teams.  These Care Teams include your primary Cardiologist (physician) and Advanced Practice Providers (APPs -  Physician Assistants and Nurse Practitioners) who all work together to provide you with the care you need, when you need it.  We recommend signing up for the patient portal called "MyChart".  Sign up information is provided on this After Visit Summary.  MyChart is used to connect with patients for Virtual Visits (Telemedicine).  Patients are able to view lab/test results, encounter notes, upcoming appointments, etc.  Non-urgent messages can be sent to your provider as well.   To learn more about what you can do with MyChart, go to [email or web address].     Other Instructions DO NOT MOVE THE ARM DO NOT SWING THE ARM AROUND USE COMPRESSION DRESSING. DO NOT APPLY TIGHTLY.

## 2023-06-20 NOTE — Progress Notes (Signed)
Cardiology Office Note:  .   Date:  06/20/2023  ID:  Noah Grant, DOB [birth year], MRN [id number] PCP: Sharlene Dory, DO  [facility] Providers Cardiologist:  Garwin Brothers, MD Electrophysiologist:  Sherryl Manges, MD      History of Present Illness: .   Noah Grant is a 72 y.o. male with past medical history of CAD s/p DES x 2 to rPDA 08/2020, PAF, myocarditis, bipolar 1 disorder, rheumatoid arthritis, hypertension, hyperlipidemia, CKD stage III, and TIA/CVA. He has a longstanding history of bradycardia.  He was previously referred to Dr. Graciela Husbands due to concern by his nephrologist and the rheumatologist that his bradycardia was contributing to weakness and worsening renal function.  He was admitted in June 2023 with weakness, tremors, fever and abdominal pain concerning for sepsis.  He was diagnosed with myopericarditis by symptoms and a cardiac MRI.  Cardiac MRI demonstrated mid wall basal inferolateral LGE and elevated T2 consistent with myocarditis.  He was placed on 12-month course of colchicine.  He also had atrial fibrillation with RVR. Based on Dr. Odessa Fleming last note in January 2024, he had improvement in his heart rate with the decrease of metoprolol dosage.  Metoprolol was discontinued it during that visit.  Echocardiogram obtained on 01/02/2023 showed EF 60 to 65%, no regional wall motion abnormality, moderate LVH, mild MR.  Subsequent heart monitor in February 2024 demonstrated minimal heart rate 36, maximal heart rate 120, average heart rate 56, less than 1% PVCs.  More recently, patient was seen by Dr. Tomie China with chest discomfort, a cardiac catheterization was set up for 6/28.  Patient underwent cardiac catheterization on 05/29/2023 which showed widely patent overlapping stents in the RPDA, 60% disease in RPDA unchanged when compared to the previous image, minimal to mild residual CAD.  Medical therapy was recommended.  Ranexa 500 mg twice a day was added.  He  returned back to the emergency room on 06/13/2023 due to swelling and tenderness in the calf area.  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  The case was discussed with Dr. Herbie Baltimore who recommended Bardelas test to assess distal perfusion with occlusion of the radial and ulnar artery prior to attempt to hold pressure to reduce the pseudoaneurysm.  Patient passed the bar does test and that showed perfusion through both sides however when attempted to undergo compression, patient could not tolerated due to pain.  A pressure dressing was placed and he was instructed to follow-up with cardiology service.  Patient presented today for right ulnar pseudoaneurysm assessment.  Right upper extremity arterial duplex obtained today showed right ulnar artery pseudoaneurysm now measuring 3.1 x 1.42 x 1.3 cm.  On physical exam, he has a pulsatile mass in the right ulnar artery location and a bruit on physical exam.  Recent cardiac catheterization was done via right ulnar artery approach.  He has significant pain with minimal touch.  His last dose of Eliquis was around 5 AM this morning 06/20/2023.  I instructed the patient to stop Eliquis completely.  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  EKG today shows sinus rhythm.  I am unable to reduce the ulnar pseudoaneurysm in the office.  Case discussed with Dr. Herbie Baltimore and vascular surgery.  We will send the patient to [facility] emergency room with anticipation of vascular repair.  After continuing  his Eliquis, however his last dose of Eliquis was this morning.  He has been instructed to seek urgent medical attention if he has sudden onset of pain or if the swelling suddenly worsens.  ROS:   He denies chest pain, palpitations, dyspnea, pnd, orthopnea, n, v, dizziness, syncope, edema, weight gain, or early satiety. All  other systems reviewed and are otherwise negative except as noted above.    Studies Reviewed: [name]   EKG Interpretation Date/Time:  Friday June 20 2023 08:46:40 EDT Ventricular Rate:  51 PR Interval:  174 QRS Duration:  140 QT Interval:  448 QTC Calculation: 412 R Axis:   81  Text Interpretation: Sinus bradycardia Right bundle branch block No significant ST-T wave changes Confirmed by Azalee Course [phone]) on 06/20/2023 9:51:42 AM    Cardiac Studies & Procedures   CARDIAC CATHETERIZATION  CARDIAC CATHETERIZATION 06/06/2023  Narrative   RPDA-1 overlapping stents are widely patent & RPDA-2 lesion is 60% stenosed (seen on previous post-PCI images)   Minimal to mild LCA CAD   -------------------- HEMODYNAMICS ----------------------   LV end diastolic pressure is normal.   There is no aortic valve stenosis.   -------------------RECOMMENDATIONS-----------------------   Recommend to resume Apixaban, at currently prescribed dose and frequency on 06/06/2023.   Recommend concurrent antiplatelet therapy of Aspirin 81 mg daily.   Stable findings on angiography with widely patent PDA stent. Patient has LVH on echo, could consider microvascular disease.  I opted to forego CFR measurement to avoid extra contrast.   RECOMMENDATIONS Discharge home after bedrest to follow-up with Dr. Tomie China Have added Ranexa 500 mg twice daily He is also no longer on beta-blocker due to bradycardia and fatigue, could consider possibly using a less potent rate control beta-blocker such as Bystolic or carvedilol for antianginal benefit. Patient is complaining of feeling extremely cold and weak as result of side effects from Eliquis. Recommend that he discuss with his primary cardiologist. He says he has not had A-fib since his myocarditis. Was asking about going back on aspirin and Plavix and coming off of DOAC. Will defer to primary cardiologist.   Bryan Lemma, MD  Findings Coronary Findings Diagnostic   Dominance: Right  Left Main Vessel was injected. Vessel is normal in caliber. Vessel is angiographically normal.  Left Anterior Descending There is mild diffuse disease throughout the vessel. There is mild diffuse nonobstructive calcific plaque in the mid LAD  Left Circumflex The vessel exhibits minimal luminal irregularities.  Right Coronary Artery Vessel was injected. Vessel is normal in caliber.  Right Posterior Descending Artery Non-stenotic RPDA-1 lesion was previously treated. RPDA-2 lesion is 60% stenosed.  Intervention  No interventions have been documented.   CARDIAC CATHETERIZATION  CARDIAC CATHETERIZATION 09/06/2020  Narrative 1.  Severe single-vessel coronary artery disease involving the right PDA branch, treated successfully with overlapping drug-eluting stents, 0% residual stenosis 2.  Mild nonobstructive disease in the mid LAD 3.  Normal left main and left circumflex 4.  Known normal LV function by echo assessment with LVEF 60%  Recommendations: Same-day PCI protocol, aspirin and clopidogrel for 6 months without interruption.  Findings Coronary Findings Diagnostic  Dominance: Right  Left Main Vessel is angiographically normal.  Left Anterior Descending There is mild diffuse disease throughout the vessel. There is mild diffuse nonobstructive calcific plaque in the mid LAD  Left Circumflex The vessel exhibits minimal luminal irregularities.  Right Coronary Artery  Right Posterior Descending Artery RPDA lesion is 95% stenosed.  Intervention  RPDA lesion Stent CATH VISTA GUIDE 6FR  JR4 guide catheter was inserted. Lesion crossed with guidewire using a WIRE COUGAR XT STRL 190CM. Pre-stent angioplasty was performed using a BALLOON SAPPHIRE 2.5X15. A drug-eluting stent was successfully placed using a STENT RESOLUTE ONYX 2.5X18. Post-stent angioplasty was performed using a BALLOON SAPPHIRE [place] 3.0X15. Overlapping DES in the PDA (2.5x18 mm Resolute DES and  3.0x8 mm Resolute DES), both post-dilated to high pressure with a 3.0 mm [place] balloon Post-Intervention Lesion Assessment The intervention was successful. Pre-interventional TIMI flow is 3. Post-intervention TIMI flow is 3. No complications occurred at this lesion. There is a 0% residual stenosis post intervention.     ECHOCARDIOGRAM  ECHOCARDIOGRAM COMPLETE 01/02/2023  Narrative ECHOCARDIOGRAM REPORT    Patient Name:   Noah Grant Date of Exam: 01/02/2023 Medical Rec #:  [id number]       Height:       67.0 in Accession #:    [id number]      Weight:       171.4 lb Date of Birth:  Jan 13, 1951      BSA:          1.894 m Patient Age:    71 years        BP:           154/67 mmHg Patient Gender: M               HR:           57 bpm. Exam Location:  [address]  Procedure: 2D Echo, Cardiac Doppler and Color Doppler  Indications:    I48.91* Unspecified atrial fibrillation  History:        Patient has prior history of Echocardiogram examinations, most recent 05/12/2022. CAD, Stroke and PAD, Signs/Symptoms:Fatigue, Shortness of Breath, Chest Pain and Murmur; Risk Factors:Hypertension, Dyslipidemia and Former Smoker. Bradycardia. Peripheral edema. Pericarditis. Myocarditis. Rheumatic fever. Rheumatoid arthritis.  Sonographer:    Cathie Beams RCS Referring Phys: [phone] Duke Salvia   Sonographer Comments: Technically difficult to position patient due to rheumatoid arthritis discomfort. IMPRESSIONS   1. Left ventricular ejection fraction, by estimation, is 60 to 65%. The left ventricle has normal function. The left ventricle has no regional wall motion abnormalities. There is moderate concentric left ventricular hypertrophy. Left ventricular diastolic parameters were normal. 2. Right ventricular systolic function is normal. The right ventricular size is normal. Tricuspid regurgitation signal is inadequate for assessing PA pressure. 3. The mitral valve is normal in structure. Mild  mitral valve regurgitation. No evidence of mitral stenosis. 4. The aortic valve is tricuspid. Aortic valve regurgitation is not visualized. Aortic valve sclerosis/calcification is present, without any evidence of aortic stenosis.  FINDINGS Left Ventricle: Left ventricular ejection fraction, by estimation, is 60 to 65%. The left ventricle has normal function. The left ventricle has no regional wall motion abnormalities. The left ventricular internal cavity size was normal in size. There is moderate concentric left ventricular hypertrophy. Left ventricular diastolic parameters were normal. Normal left ventricular filling pressure.  Right Ventricle: The right ventricular size is normal. No increase in right ventricular wall thickness. Right ventricular systolic function is normal. Tricuspid regurgitation signal is inadequate for assessing PA pressure.  Left Atrium: Left atrial size was normal in size.  Right Atrium: Right atrial size was normal in size.  Pericardium: There is no evidence of pericardial effusion.  Mitral Valve: The mitral valve is normal in structure. Mild mitral valve regurgitation. No evidence of mitral valve stenosis.  Tricuspid Valve: The tricuspid valve is normal in structure. Tricuspid  valve regurgitation is trivial. No evidence of tricuspid stenosis.  Aortic Valve: The aortic valve is tricuspid. Aortic valve regurgitation is not visualized. Aortic valve sclerosis/calcification is present, without any evidence of aortic stenosis.  Pulmonic Valve: The pulmonic valve was normal in structure. Pulmonic valve regurgitation is not visualized. No evidence of pulmonic stenosis.  Aorta: The aortic root is normal in size and structure.  Venous: The inferior vena cava was not well visualized.  IAS/Shunts: No atrial level shunt detected by color flow Doppler.   LEFT VENTRICLE PLAX 2D LVIDd:         2.60 cm   Diastology LVIDs:         1.60 cm   LV e' medial:    8.70 cm/s LV  PW:         1.40 cm   LV E/e' medial:  11.1 LV IVS:        1.40 cm   LV e' lateral:   13.20 cm/s LVOT diam:     2.20 cm   LV E/e' lateral: 7.3 LV SV:         138 LV SV Index:   73 LVOT Area:     3.80 cm   RIGHT VENTRICLE RV Basal diam:  3.40 cm RV S prime:     13.40 cm/s TAPSE (M-mode): 2.5 cm  LEFT ATRIUM             Index        RIGHT ATRIUM           Index LA diam:        4.00 cm 2.11 cm/m   RA Area:     13.80 cm LA Vol (A2C):   51.6 ml 27.25 ml/m  RA Volume:   27.20 ml  14.36 ml/m LA Vol (A4C):   54.3 ml 28.67 ml/m LA Biplane Vol: 53.1 ml 28.04 ml/m AORTIC VALVE LVOT Vmax:   147.00 cm/s LVOT Vmean:  99.200 cm/s LVOT VTI:    0.362 m  AORTA Ao Root diam: 3.00 cm Ao Asc diam:  3.60 cm  MITRAL VALVE MV Area (PHT): 3.50 cm       SHUNTS MV Decel Time: 217 msec       Systemic VTI:  0.36 m MR Peak grad:    130.6 mmHg   Systemic Diam: 2.20 cm MR Mean grad:    71.0 mmHg MR Vmax:         571.50 cm/s MR Vmean:        380.5 cm/s MR PISA:         1.57 cm MR PISA Eff ROA: 11 mm MR PISA Radius:  0.50 cm MV E velocity: 96.90 cm/s MV A velocity: 77.60 cm/s MV E/A ratio:  1.25  Armanda Magic MD Electronically signed by Armanda Magic MD Signature Date/Time: 01/02/2023/3:34:19 PM    Final    MONITORS  LONG TERM MONITOR (3-14 DAYS) 01/21/2023  Narrative Patch Wear Time:  11 days and 21 hours (2024-01-25T16:58:53-0500 to 2024-02-06T14:12:31-0500)  Patient had a min HR of 36 bpm, max HR of 120 bpm, and avg HR of 56 bpm. Predominant underlying rhythm was Sinus Rhythm. Isolated SVEs were rare (<1.0%), SVE Couplets were rare (<1.0%), and no SVE Triplets were present. Isolated VEs were rare (<1.0%), VE Couplets were rare (<1.0%), and no VE Triplets were present. Ventricular Bigeminy and Trigeminy were present.  Reveiewd with patient  no significant arrhythmia    CARDIAC MRI  MR CARDIAC MORPHOLOGY W WO CONTRAST 05/13/2022  Narrative CLINICAL DATA:  Evaluate for  myocarditis  EXAM: CARDIAC MRI  TECHNIQUE: The patient was scanned on a 1.5 Tesla Siemens magnet. A dedicated cardiac coil was used. Functional imaging was done using Fiesta sequences. 2,3, and 4 chamber views were done to assess for RWMA's. Modified Simpson's rule using a short axis stack was used to calculate an ejection fraction on a dedicated work [employer]. The patient received 10 cc of Gadavist. After 10 minutes inversion recovery sequences were used to assess for infiltration and scar tissue.  CONTRAST:  10 cc  of Gadavist  FINDINGS: Left ventricle:  -Normal size  -Normal systolic function  -Artifact prevents interpretation of T1 maps  -Elevated native T2 values in inferior/inferolateral walls  -Basal inferolateral midwall LGE  LV EF: 63% (Normal 56-78%)  Absolute volumes:  LV EDV: (Normal 77-195 mL)  LV ESV: 51mL (Normal 19-72 mL)  LV SV: 88mL (Normal 51-133 mL)  CO: 5.8L/min (Normal 2.8-8.8 L/min)  Indexed volumes:  LV EDV: 36mL/sq-m (Normal 47-92 mL/sq-m)  LV ESV: 74mL/sq-m (Normal 13-30 mL/sq-m)  LV SV: 87mL/sq-m (Normal 32-62 mL/sq-m)  CI: 3.3L/min/sq-m (Normal 1.7-4.2 L/min/sq-m)  Right ventricle: Normal size and systolic function  RV EF:  58% (Normal 47-74%)  Absolute volumes:  RV EDV: (Normal 88-227 mL)  RV ESV: 66mL (Normal 23-103 mL)  RV SV: 91mL (Normal 52-138 mL)  CO: 6.1L/min (Normal 2.8-8.8 L/min)  Indexed volumes:  RV EDV: 70mL/sq-m (Normal 55-105 mL/sq-m)  RV ESV: 7mL/sq-m (Normal 15-43 mL/sq-m)  RV SV: 33mL/sq-m (Normal 32-64 mL/sq-m)  CI: 3.4L/min/sq-m (Normal 1.7-4.2 L/min/sq-m)  Left atrium: Mild enlargement  Right atrium: Mild enlargement  Mitral valve: Trivial regurgitation  Aortic valve: No regurgitation  Tricuspid valve: Trivial regurgitation  Pulmonic valve: No regurgitation  Aorta: Normal proximal ascending aorta  Pericardium: Normal  Extracardiac structures:  Moderate left, small right pleural effusions  IMPRESSION: 1. Findings suggestive of acute myocarditis, with elevated myocardial T2 values and basal inferolateral midwall late gadolinium enhancement, which is a scar pattern commonly seen in myocarditis  2.  Normal LV size and systolic function (EF 63%)  3.  Normal RV size and systolic function (EF 58%)  4.  Moderate left and small right pleural effusions   Electronically Signed By: Epifanio Lesches M.D. On: 05/13/2022 22:41         Risk Assessment/Calculations:    CHA2DS2-VASc Score = 5   This indicates a 7.2% annual risk of stroke. The patient's score is based upon: CHF History: 0 HTN History: 1 Diabetes History: 0 Stroke History: 2 Vascular Disease History: 1 Age Score: 1 Gender Score: 0           Physical Exam:   VS:  BP (!) 140/72 (BP Location: Right Arm, Patient Position: Sitting, Cuff Size: Normal)   Pulse (!) 51   Ht 5\' 7"  (1.702 m)   Wt 162 lb 3.2 oz (73.6 kg)   SpO2 98%   BMI 25.40 kg/m    Wt Readings from Last 3 Encounters:  06/20/23 162 lb 3.2 oz (73.6 kg)  06/13/23 162 lb (73.5 kg)  06/06/23 162 lb (73.5 kg)    GEN: Well nourished, well developed in no acute distress NECK: No JVD; No carotid bruits CARDIAC: RRR, no murmurs, rubs, gallops RESPIRATORY:  Clear to auscultation without rales, wheezing or rhonchi  ABDOMEN: Soft, non-tender, non-distended EXTREMITIES:  No edema; No deformity.  Right ulnar pseudoaneurysm, pulsatile on exam, bruit present  ASSESSMENT AND PLAN: .  Right ulnar artery pseudoaneurysm  -Today's vascular ultrasound shows right ulnar artery pseudoaneurysm is enlarging.  Patient recently underwent cardiac catheterization via right ulnar approach.  He was seen in the ED on 7/5, ED physician was unable to reduce the pseudoaneurysm with compression.  He was discharged with compression dressing, however his pseudoaneurysm continued to enlarge.  Ulnar artery pseudoaneurysm  aneurysm is very tender to touch.  I am unable to reduce it in the office.  The case was discussed with Dr. Herbie Baltimore and vascular surgery, will send the patient to the emergency room.  His last dose of Eliquis was this morning at 5 AM.  I have discontinued his Eliquis.  Bradycardia: Asymptomatic.  Improved after discontinuation of beta-blocker.  CAD: Recent cardiac catheterization showed a stable anatomy.  He continued to have chronic chest pain which is unchanged.  He could not tolerate Ranexa  PAF: Only episode of A-fib he had a was doing myopericarditis in June 2023, no recurrence since then.  Will discontinue Eliquis given the enlarging right ulnar pseudoaneurysm.  Last dose of Eliquis was this morning.  Hypertension: Blood pressure stable  Hyperlipidemia: On simvastatin  Stage III CKD: Stable on last blood work.       Dispo: Patient was sent to the emergency room to potential undergo vascular surgery this weekend.  Follow-up with Dr. Tomie China in August as previously scheduled.  Signed, Azalee Course, PA

## 2023-06-20 NOTE — ED Provider Notes (Signed)
[facility] AT [facility] Provider Note  CSN: [id number] Arrival date & time: 06/20/23 1116  Chief Complaint(s) No chief complaint on file.  HPI PEPE LATONA is a 72 y.o. 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  Patient had a cardiac catheter recently and has had a subsequent formation of a right wrist pseudoaneurysm.  He was scheduled to have operative repair today first prior to hospital admission.  Endorses some mild pain at the wrist but otherwise arrives with no symptoms.  Unfortunately, patient did take his Eliquis this morning at 530.   Past Medical History Past Medical History:  Diagnosis Date   Abdominal aortic atherosclerosis (HCC)    Anemia    likely of chronic disease   Angina pectoris (HCC) 08/30/2020   Arthritis    Atrial fibrillation (HCC)    Atrial fibrillation with rapid ventricular response (HCC) 05/12/2022   Barrett's esophagus without dysplasia 03/25/2019   Formatting of this note might be different from the original. EGD done in 02/2019. Due in 3 years.   Benign prostatic hyperplasia with nocturia 12/29/2018   Bipolar 1 disorder (HCC)    Bipolar 1 disorder, mixed, moderate (HCC) 08/16/2014   Bipolar 2 disorder (HCC) 08/16/2014   Bipolar affective disorder, currently depressed, moderate (HCC) 03/08/2020   Bipolar depression (HCC) 08/16/2014   BPH with urinary obstruction 12/29/2018   Brain ischemia 03/08/2020   Cardiac murmur 08/30/2020   Chest pain 09/06/2020   Chicken pox    Chronic bronchitis (HCC)    Chronic kidney disease, stage 3a (HCC) 07/21/2019   Cognitive complaints 03/08/2020   Coronary artery disease involving native coronary artery of native heart 09/06/2020   Coronary artery disease involving native coronary artery of native heart with angina pectoris (HCC) 09/06/2020   Depression    Disc degeneration, 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   Drug therapy 05/18/2021   Elevated  troponin level not due myocardial infarction 05/11/2022   Essential hypertension    GAD (generalized anxiety disorder) 11/02/2018   Gastroesophageal reflux disease 08/16/2014   Formatting of this note might be different from the original. Last Assessment & Plan:  Well controlled. Continue current medications.   GERD without esophagitis 08/16/2014   Formatting of this note might be different from the original. Last Assessment & Plan:  Well controlled. Continue current medications.   Herpes gingivostomatitis 02/10/2015   High risk medication use 03/23/2018   History of colon polyps 03/25/2019   Formatting of this note might be different from the original. tubular adenoma   History of stroke 11/10/2019   Hyperlipidemia    Hyperphosphatemia 10/30/2022   Hypertension    Ingrowing nail 03/12/2020   Intention tremor 06/16/2019   Labile hypertension 08/16/2014   Formatting of this note might be different from the original. Last Assessment & Plan:  Slightly above goal today secondary to pain. Will continue current regimen. Will check CMP today.   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 Concern over possible lichen planus. His dentist noticed lesions of his oral mucosa.  It was felt to be lichen planus.  He presents to discuss further.  Rarely does the area hurt.He smoked in the distant past EXAM shows several white patches on the buccal mucosa bilaterally.  No other concerning oral mucosal les   Lithium use 10/30/2022   Long-term use of immunosuppressant medication 03/23/2018   Low  testosterone 09/17/2016   Midline thoracic back pain 02/10/2015   Mixed hyperlipidemia 08/16/2014   Myocarditis (HCC) 05/14/2022   Normochromic normocytic anemia 08/16/2014   Normocytic anemia    likely of chronic disease   PAD (peripheral artery disease) (HCC) 03/23/2019   Pain of right hip joint 01/19/2016    Pericarditis 05/12/2022   Rheumatic fever    Rheumatoid arthritis (HCC) 10/29/2018   Formatting of this note might be different from the original. Rheum:  Dr. Sharmon Revere Formerly on MTX - stopped due to mouth sores. Currently started on Arava.  Also on 5 mg prednisone x 3 months during run-in.   Right-sided chest wall pain 04/17/2019   Seasonal allergies 03/23/2018   Sepsis (HCC) 05/11/2022   Stasis dermatitis of both legs 07/29/2022   Statin intolerance 07/31/2020   Task-specific dystonia of hand    right hand   Thoracic radiculopathy 06/16/2019   Vitamin B12 deficiency    Injections   Vitamin D deficiency 09/17/2016   Patient Active Problem List   Diagnosis Date Noted   Hyperphosphatemia 10/30/2022   Lithium use 10/30/2022   Anemia 09/02/2022   Atrial fibrillation (HCC) 09/02/2022   Stasis dermatitis of both legs 07/29/2022   Myocarditis (HCC) 05/14/2022   Pericarditis 05/12/2022   Atrial fibrillation with rapid ventricular response (HCC) 05/12/2022   Sepsis (HCC) 05/11/2022   Elevated troponin level not due myocardial infarction 05/11/2022   Lactic acidosis 05/11/2022   Drug therapy 05/18/2021   Hypertension    Normocytic anemia    Arthritis    Bipolar 1 disorder (HCC)    Chicken pox    Chronic bronchitis (HCC)    Depression    Hyperlipidemia    Rheumatic fever    Essential hypertension    Coronary artery disease involving native coronary artery of native heart 09/06/2020   Chest pain 09/06/2020   Coronary artery disease involving native coronary artery of native heart with angina pectoris (HCC) 09/06/2020   Angina pectoris (HCC) 08/30/2020   Cardiac murmur 08/30/2020   Statin intolerance 07/31/2020   Abdominal aortic atherosclerosis (HCC)    Ingrowing nail 03/12/2020   Bipolar affective disorder, currently depressed, moderate (HCC) 03/08/2020   Brain ischemia 03/08/2020   Cognitive complaints 03/08/2020   History of stroke 11/10/2019   Chronic kidney disease,  stage 3a (HCC) 07/21/2019   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   Thoracic radiculopathy 06/16/2019   Intention tremor 06/16/2019   Right-sided chest wall pain 04/17/2019   Barrett's esophagus without dysplasia 03/25/2019   History of colon polyps 03/25/2019   PAD (peripheral artery disease) (HCC) 03/23/2019   Benign prostatic hyperplasia with nocturia 12/29/2018   BPH with urinary obstruction 12/29/2018   GAD (generalized anxiety disorder) 11/02/2018   Rheumatoid arthritis (HCC) 10/29/2018   Lichen planus 04/07/2018   High risk medication use 03/23/2018   Seasonal allergies 03/23/2018   Long-term use of immunosuppressant medication 03/23/2018   Low testosterone 09/17/2016   Vitamin B12 deficiency 09/17/2016   Vitamin D deficiency 09/17/2016   Disc degeneration, 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   Pain of right hip joint 01/19/2016   Herpes gingivostomatitis 02/10/2015   Midline thoracic back pain 02/10/2015   Normochromic normocytic anemia 08/16/2014   Bipolar 1 disorder, mixed, moderate (HCC) 08/16/2014   Mixed hyperlipidemia 08/16/2014   Labile hypertension 08/16/2014   Gastroesophageal reflux disease 08/16/2014   Bipolar depression (HCC) 08/16/2014   GERD without esophagitis 08/16/2014   Bipolar 2 disorder (HCC) 08/16/2014   Home Medication(s) Prior to Admission medications  Medication Sig Start Date End Date Taking? Authorizing Provider  acetaminophen (TYLENOL) 650 MG CR tablet Take 1,300 mg by mouth every 8 (eight) hours as needed for pain.    [provider]  amLODipine (NORVASC) 5 MG tablet Take 2 tablets (10 mg total) by mouth daily. 02/24/23   Sharlene Dory, DO  Cholecalciferol 50 MCG (2000 UT) CAPS Take 2,000 Units by mouth daily.     [provider]  Coenzyme Q10 (COQ10) 200 MG CAPS Take 200 mg by mouth daily.    [provider]  diclofenac Sodium (VOLTAREN) 1 % GEL Apply 1 application  topically daily as needed for pain.    [provider]  ELIQUIS 5 MG TABS tablet TAKE 1 TABLET BY MOUTH TWICE A DAY 03/25/23   [name], Jilda Roche, DO  ezetimibe (ZETIA) 10 MG tablet Take 1 tablet (10 mg total) by mouth daily. 09/30/22   Revankar, Aundra Dubin, MD  finasteride (PROSCAR) 5 MG tablet TAKE 1 TABLET BY MOUTH DAILY 01/13/23   Sharlene Dory, DO  hydrALAZINE (APRESOLINE) 50 MG tablet Take 50 mg by mouth 3 (three) times daily.    [provider]  leflunomide (ARAVA) 20 MG tablet TAKE 1 TABLET BY MOUTH DAILY 04/21/23   Rice, Jamesetta Orleans, MD  lithium carbonate 150 MG capsule Take 3 capsules (450 mg total) by mouth daily. 05/07/23   Cottle, Steva Ready., MD  losartan (COZAAR) 100 MG tablet Take 100 mg by mouth daily.    [provider]  Lysine HCl 1000 MG TABS Take 1,000 mg by mouth 2 (two) times daily.     [provider]  nitroGLYCERIN (NITROSTAT) 0.4 MG SL tablet Place 1 tablet (0.4 mg total) under the tongue every 5 (five) minutes as needed for chest pain. 04/04/23   Revankar, Aundra Dubin, MD  omeprazole (PRILOSEC) 40 MG capsule Take 1 capsule (40 mg total) by mouth in the morning and at bedtime. 02/24/23   Sharlene Dory, DO  simvastatin (ZOCOR) 5 MG tablet TAKE ONE TABLET BY MOUTH DAILY 09/30/22   Sharlene Dory, DO  triamcinolone cream (KENALOG) 0.1 % Apply 1 Application topically 2 (two) times daily. 06/13/23   Terrilee Files, MD  vitamin B-12 (CYANOCOBALAMIN) 1000 MCG tablet Take 1,000 mcg by mouth daily.    [provider]                                                                                                                                    Past Surgical History Past Surgical History:  Procedure Laterality Date   CLAVICLE SURGERY     Right, Hardware placed   CORONARY STENT INTERVENTION N/A 09/06/2020   Procedure: CORONARY STENT INTERVENTION;  Surgeon: Tonny Bollman, MD;  Location: [facility] INVASIVE CV LAB;  Service: Cardiovascular;  Laterality: N/A;    ingrown toenail Bilateral    great toes   LEFT HEART  CATH AND CORONARY ANGIOGRAPHY N/A 09/06/2020   Procedure: LEFT HEART CATH AND CORONARY ANGIOGRAPHY;  Surgeon: Tonny Bollman, MD;  Location: [facility] INVASIVE CV LAB;  Service: Cardiovascular;  Laterality: N/A;   LEFT HEART CATH AND CORONARY ANGIOGRAPHY N/A 06/06/2023   Procedure: LEFT HEART CATH AND CORONARY ANGIOGRAPHY;  Surgeon: Marykay Lex, MD;  Location: [facility] INVASIVE CV LAB;  Service: Cardiovascular;  Laterality: N/A;   WISDOM TOOTH EXTRACTION     Family History Family History  Problem Relation Age of Onset   Alzheimer's disease Mother 71       Deceased in early 52s   Stroke Father 63       Deceased   Hypertension Father    Alcoholism Father    Alcoholism Brother    Heart disease Maternal Grandfather 69       Deceased   Heart disease Paternal Grandfather 38       Deceased   Hypertension Son     Social History Social History   Tobacco Use   Smoking status: Former    Current packs/day: 0.00    Average packs/day: 2.0 packs/day for 10.0 years (20.0 ttl pk-yrs)    Types: Cigarettes    Start date: 1964    Quit date: 70    Years since quitting: 50.5    Passive exposure: Past   Smokeless tobacco: Never   Tobacco comments:    Quit>40 years  Vaping Use   Vaping status: Never Used  Substance Use Topics   Alcohol use: Not Currently    Alcohol/week: 0.0 standard drinks of alcohol   Drug use: Never   Allergies Methotrexate, Nsaids, Plaquenil [hydroxychloroquine], Azulfidine [sulfasalazine], Crestor [rosuvastatin], Iodinated contrast media, Isosorbide, Mevacor [lovastatin], and Pravachol [pravastatin]  Review of Systems Review of Systems  All other systems reviewed and are negative.   Physical Exam Vital Signs  I have reviewed the triage vital signs BP (!) 186/65   Pulse (!) 51   Temp 97.7 F (36.5 C) (Oral)   Resp 13   SpO2 100%   Physical Exam Constitutional:      General: He is not in acute distress.     Appearance: Normal appearance.  HENT:     Head: Normocephalic and atraumatic.     Nose: No congestion or rhinorrhea.  Eyes:     General:        Right eye: No discharge.        Left eye: No discharge.     Extraocular Movements: Extraocular movements intact.     Pupils: Pupils are equal, round, and reactive to light.  Cardiovascular:     Rate and Rhythm: Normal rate and regular rhythm.     Heart sounds: No murmur heard. Pulmonary:     Effort: No respiratory distress.     Breath sounds: No wheezing or rales.  Abdominal:     General: There is no distension.     Tenderness: There is no abdominal tenderness.  Musculoskeletal:        General: Normal range of motion.     Cervical back: Normal range of motion.  Skin:    General: Skin is warm and dry.  Neurological:     General: No focal deficit present.     Mental Status: He is alert.     ED Results and Treatments Labs (all labs ordered are listed, but only abnormal results are displayed) Labs Reviewed - No data to display  Radiology VAS [place] UPPER EXTREMITY ARTERIAL DUPLEX  Result Date: 06/20/2023  UPPER EXTREMITY DUPLEX STUDY Patient Name:  BENZINO BUZZETTA  Date of Exam:   06/20/2023 Medical Rec #: [id number]        Accession #:    [id number] Date of Birth: [birth year]       Patient Gender: M Patient Age:   38 years Exam Location:  [facility] Procedure:      VAS [place] UPPER EXTREMITY ARTERIAL DUPLEX Referring Phys: DAVID HARDING --------------------------------------------------------------------------------  Indications: Wrist pain, bruising and palpable knot. History:     Patient has a history of catheterization via right radial artery.  Risk Factors:  Hypertension, hyperlipidemia, past history of smoking, coronary                artery disease. Other Factors: NOTE: Patient had a dose of Eliquis 5 mg this morning. Comparison  Study: Upper arterial Duplex done 06/13/23 at [facility] showed a                   pseudoaneurysm coming off the right ulnar artery measuring 2.2                   cm x 0.9 cm x 1.3 cm. Patient had cardiac catheterization on                   06/06/23. Performing Technologist: Jake Seats RDMS, RVT, RDCS  Examination Guidelines: A complete evaluation includes B-mode imaging, spectral Doppler, color Doppler, and power Doppler as needed of all accessible portions of each vessel. Bilateral testing is considered an integral part of a complete examination. Limited examinations for reoccurring indications may be performed as noted.  Right Doppler Findings: +----------+----------+---------+--------+--------+ Site      PSV (cm/s)Waveform StenosisComments +----------+----------+---------+--------+--------+ Ulnar Mid 130       biphasic                  +----------+----------+---------+--------+--------+ Ulnar Dist28        triphasic                 +----------+----------+---------+--------+--------+ Hypoechoic pulsitile mass seen in the right wrist area coming off the distal right ulnar artery measuring 3.1 cm x 1.42cm x 1.3 cm. Right radial artery is patent.  Findings reported to Azalee Course, [place] at 9:00AM. Summary:  Right: Obstruction noted in the ulnar artery. Pseudoaneurysm seen        coming off the right distal ulnar artery measuring 3.1cm x        1.4cm x 1.3 cm. Pseudoaneurysm neck measured 0.15 cm.         Patient is instructed to be NPO, and go to [facility] ER to        be assessed by Vascular Surgery. *See table(s) above for measurements and observations. Suggest Peripheral Vascular Consult.    Preliminary     Pertinent labs & imaging results that were available during my care of the patient were reviewed by me and considered in my medical decision making (see MDM for details).  Medications Ordered in ED Medications - No data to display  Procedures Procedures  (including critical care time)  Medical Decision Making / ED Course   This patient presents to the ED for concern of pseudoaneurysm, this involves an extensive number of treatment options, and is a complaint that carries with it a high risk of complications and morbidity.  The differential diagnosis includes pseudoaneurysm, active bleeding, failure of outpatient therapy  MDM: Patient seen emergency room for evaluation of a pseudoaneurysm.  Physical exam with a pseudoaneurysm at the right wrist but is otherwise unremarkable.  I spoke with Dr. Karin Lieu of vascular surgery who came to evaluate the patient at bedside and unfortunately because the patient took his Eliquis this morning he will not be able to have his surgery today.  He will be discharged today and return on 15 July for his surgery.  Patient then discharged   Additional history obtained:  -External records from outside source obtained and reviewed including: Chart review including previous notes, labs, imaging, consultation notes   Medicines ordered and prescription drug management: No orders of the defined types were placed in this encounter.   -I have reviewed the patients home medicines and have made adjustments as needed  Critical interventions none  Consultations Obtained: I requested consultation with the vascular surgeon Dr. Karin Lieu,  and discussed lab and imaging findings as well as pertinent plan - they recommend: Discharge today with return on 06/23/2023   Cardiac Monitoring: The patient was maintained on a cardiac monitor.  I personally viewed and interpreted the cardiac monitored which showed an underlying rhythm of: NSR  Social Determinants of Health:  Factors impacting patients care include: none   Reevaluation: After the interventions noted above, I reevaluated the patient and found that they have :stayed the  same  Co morbidities that complicate the patient evaluation  Past Medical History:  Diagnosis Date   Abdominal aortic atherosclerosis (HCC)    Anemia    likely of chronic disease   Angina pectoris (HCC) 08/30/2020   Arthritis    Atrial fibrillation (HCC)    Atrial fibrillation with rapid ventricular response (HCC) 05/12/2022   Barrett's esophagus without dysplasia 03/25/2019   Formatting of this note might be different from the original. EGD done in 02/2019. Due in 3 years.   Benign prostatic hyperplasia with nocturia 12/29/2018   Bipolar 1 disorder (HCC)    Bipolar 1 disorder, mixed, moderate (HCC) 08/16/2014   Bipolar 2 disorder (HCC) 08/16/2014   Bipolar affective disorder, currently depressed, moderate (HCC) 03/08/2020   Bipolar depression (HCC) 08/16/2014   BPH with urinary obstruction 12/29/2018   Brain ischemia 03/08/2020   Cardiac murmur 08/30/2020   Chest pain 09/06/2020   Chicken pox    Chronic bronchitis (HCC)    Chronic kidney disease, stage 3a (HCC) 07/21/2019   Cognitive complaints 03/08/2020   Coronary artery disease involving native coronary artery of native heart 09/06/2020   Coronary artery disease involving native coronary artery of native heart with angina pectoris (HCC) 09/06/2020   Depression    Disc degeneration, 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 Last Assessment & Plan:  Well controlled. Continue current medications.   GERD without esophagitis 08/16/2014   Formatting of this note might be different from the original. Last Assessment & Plan:  Well controlled. Continue current medications.  Herpes gingivostomatitis 02/10/2015   High risk medication use 03/23/2018   History of colon polyps  03/25/2019   Formatting of this note might be different from the original. tubular adenoma   History of stroke 11/10/2019   Hyperlipidemia    Hyperphosphatemia 10/30/2022   Hypertension    Ingrowing nail 03/12/2020   Intention tremor 06/16/2019   Labile hypertension 08/16/2014   Formatting of this note might be different from the original. Last Assessment & Plan:  Slightly above goal today secondary to pain. Will continue current regimen. Will check CMP today.   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 Concern over possible lichen planus. His dentist noticed lesions of his oral mucosa.  It was felt to be lichen planus.  He presents to discuss further.  Rarely does the area hurt.He smoked in the distant past EXAM shows several white patches on the buccal mucosa bilaterally.  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 Rheum:  Dr. Sharmon Revere Formerly on MTX - stopped due to mouth sores. Currently started on Arava.  Also on 5 mg prednisone x 3 months during run-in.   Right-sided chest wall pain 04/17/2019   Seasonal allergies 03/23/2018   Sepsis (HCC) 05/11/2022   Stasis dermatitis of both legs 07/29/2022   Statin intolerance 07/31/2020   Task-specific dystonia of hand    right hand   Thoracic radiculopathy 06/16/2019   Vitamin B12 deficiency     Injections   Vitamin D deficiency 09/17/2016      Dispostion: I considered admission for this patient, but patient unable to be admitted due to Eliquis use and he will return later for surgical planning     Final Clinical Impression(s) / ED Diagnoses Final diagnoses:  None     @PCDICTATION @    Glendora Score, MD 06/20/23 [id number]

## 2023-06-20 NOTE — ED Triage Notes (Signed)
Pt had cardiac cath done 2 weeks ago, since then he has been diagnosed with worsening aneursym in his right wrist and was told to come here. Pt c.o severe pain in wrist.

## 2023-06-20 NOTE — Progress Notes (Signed)
Spoke with pt for pre-op call. Pt has hx of CAD with 2 stents in the past. Has hx of A-fib but states he's not had any since having Myocarditis in June, 2023. Pt is on Eliquis, he states his last dose was this morning, 06/20/23 and will not take again until after his surgery on Monday. Pt states he has chronic angina. Pt is not diabetic.   Shower instructions given to pt.

## 2023-06-20 NOTE — Telephone Encounter (Signed)
Missy at [facility] called stating that pt came into office today for RUE duplex. Pt had cardiac cath on 6/28, presented to [facility] on 7/5. He was diagnosed with a pseudoaneurysm and wrapped his wrist with compression wrap. Pseudo has grown in size. Dr. Karin Lieu consulted, asked pt to go to ED, stay NPO, and he would surgically remove the pseudo in the OR today.  Called OR to get case booked for this afternoon.

## 2023-06-23 ENCOUNTER — Ambulatory Visit (HOSPITAL_COMMUNITY): Payer: PPO | Admitting: Anesthesiology

## 2023-06-23 ENCOUNTER — Other Ambulatory Visit: Payer: Self-pay

## 2023-06-23 ENCOUNTER — Observation Stay (HOSPITAL_COMMUNITY)
Admission: RE | Admit: 2023-06-23 | Discharge: 2023-06-24 | Disposition: A | Discharge status: Home / Self Care | Payer: PPO | Attending: Vascular Surgery | Admitting: Vascular Surgery

## 2023-06-23 ENCOUNTER — Encounter (HOSPITAL_COMMUNITY): Payer: Self-pay | Admitting: Vascular Surgery

## 2023-06-23 ENCOUNTER — Encounter (HOSPITAL_COMMUNITY)
Admission: RE | Disposition: A | Discharge status: Home / Self Care | Payer: Self-pay | Source: Home / Self Care | Attending: Vascular Surgery

## 2023-06-23 DIAGNOSIS — Z8673 Personal history of transient ischemic attack (TIA), and cerebral infarction without residual deficits: Secondary | ICD-10-CM | POA: Diagnosis not present

## 2023-06-23 DIAGNOSIS — I4891 Unspecified atrial fibrillation: Secondary | ICD-10-CM | POA: Diagnosis not present

## 2023-06-23 DIAGNOSIS — Z955 Presence of coronary angioplasty implant and graft: Secondary | ICD-10-CM | POA: Diagnosis not present

## 2023-06-23 DIAGNOSIS — I729 Aneurysm of unspecified site: Principal | ICD-10-CM

## 2023-06-23 DIAGNOSIS — E785 Hyperlipidemia, unspecified: Secondary | ICD-10-CM | POA: Diagnosis not present

## 2023-06-23 DIAGNOSIS — I129 Hypertensive chronic kidney disease with stage 1 through stage 4 chronic kidney disease, or unspecified chronic kidney disease: Secondary | ICD-10-CM

## 2023-06-23 DIAGNOSIS — Z87891 Personal history of nicotine dependence: Secondary | ICD-10-CM | POA: Diagnosis not present

## 2023-06-23 DIAGNOSIS — I25119 Atherosclerotic heart disease of native coronary artery with unspecified angina pectoris: Secondary | ICD-10-CM | POA: Diagnosis not present

## 2023-06-23 DIAGNOSIS — Z7901 Long term (current) use of anticoagulants: Secondary | ICD-10-CM | POA: Diagnosis not present

## 2023-06-23 DIAGNOSIS — Z79899 Other long term (current) drug therapy: Secondary | ICD-10-CM | POA: Insufficient documentation

## 2023-06-23 DIAGNOSIS — I2510 Atherosclerotic heart disease of native coronary artery without angina pectoris: Secondary | ICD-10-CM | POA: Insufficient documentation

## 2023-06-23 DIAGNOSIS — M6281 Muscle weakness (generalized): Secondary | ICD-10-CM | POA: Insufficient documentation

## 2023-06-23 DIAGNOSIS — T81718A Complication of other artery following a procedure, not elsewhere classified, initial encounter: Secondary | ICD-10-CM

## 2023-06-23 DIAGNOSIS — N1831 Chronic kidney disease, stage 3a: Secondary | ICD-10-CM | POA: Diagnosis not present

## 2023-06-23 DIAGNOSIS — I721 Aneurysm of artery of upper extremity: Secondary | ICD-10-CM | POA: Diagnosis not present

## 2023-06-23 DIAGNOSIS — G8918 Other acute postprocedural pain: Secondary | ICD-10-CM | POA: Diagnosis not present

## 2023-06-23 HISTORY — DX: Cerebral infarction, unspecified: I63.9

## 2023-06-23 HISTORY — PX: FALSE ANEURYSM REPAIR: SHX5152

## 2023-06-23 LAB — COMPREHENSIVE METABOLIC PANEL
ALT: 52 U/L — ABNORMAL HIGH (ref 0–44)
AST: 39 U/L (ref 15–41)
Albumin: 4.5 g/dL (ref 3.5–5.0)
Alkaline Phosphatase: 60 U/L (ref 38–126)
Anion gap: 10 (ref 5–15)
BUN: 20 mg/dL (ref 8–23)
CO2: 20 mmol/L — ABNORMAL LOW (ref 22–32)
Calcium: 9.5 mg/dL (ref 8.9–10.3)
Chloride: 112 mmol/L — ABNORMAL HIGH (ref 98–111)
Creatinine, Ser: 1.35 mg/dL — ABNORMAL HIGH (ref 0.61–1.24)
GFR, Estimated: 56 mL/min — ABNORMAL LOW (ref >60)
Glucose, Bld: 92 mg/dL (ref 70–99)
Potassium: 4.1 mmol/L (ref 3.5–5.1)
Sodium: 142 mmol/L (ref 135–145)
Total Bilirubin: 0.7 mg/dL (ref 0.3–1.2)
Total Protein: 7.1 g/dL (ref 6.5–8.1)

## 2023-06-23 LAB — CBC
HCT: 37.1 % — ABNORMAL LOW (ref 39.0–52.0)
HCT: 30 % — ABNORMAL LOW (ref 39.0–52.0)
Hemoglobin: 12.4 g/dL — ABNORMAL LOW (ref 13.0–17.0)
Hemoglobin: 9.9 g/dL — ABNORMAL LOW (ref 13.0–17.0)
MCH: 30.8 pg (ref 26.0–34.0)
MCH: 29.7 pg (ref 26.0–34.0)
MCHC: 33.4 g/dL (ref 30.0–36.0)
MCHC: 33 g/dL (ref 30.0–36.0)
MCV: 92.1 fL (ref 80.0–100.0)
MCV: 90.1 fL (ref 80.0–100.0)
Platelets: 216 10*3/uL (ref 150–400)
Platelets: 200 10*3/uL (ref 150–400)
RBC: 4.03 MIL/uL — ABNORMAL LOW (ref 4.22–5.81)
RBC: 3.33 MIL/uL — ABNORMAL LOW (ref 4.22–5.81)
RDW: 16.4 % — ABNORMAL HIGH (ref 11.5–15.5)
RDW: 16.2 % — ABNORMAL HIGH (ref 11.5–15.5)
WBC: 5.6 10*3/uL (ref 4.0–10.5)
WBC: 5.8 10*3/uL (ref 4.0–10.5)
nRBC: 0 % (ref 0.0–0.2)
nRBC: 0 % (ref 0.0–0.2)

## 2023-06-23 LAB — TYPE AND SCREEN
ABO/RH(D): O NEG
Antibody Screen: NEGATIVE

## 2023-06-23 LAB — SURGICAL PCR SCREEN
MRSA, PCR: NEGATIVE
Staphylococcus aureus: NEGATIVE

## 2023-06-23 LAB — PROTIME-INR
INR: 1 (ref 0.8–1.2)
Prothrombin Time: 13.3 seconds (ref 11.4–15.2)

## 2023-06-23 LAB — URINALYSIS, ROUTINE W REFLEX MICROSCOPIC
Bilirubin Urine: NEGATIVE
Glucose, UA: NEGATIVE mg/dL
Hgb urine dipstick: NEGATIVE
Ketones, ur: NEGATIVE mg/dL
Leukocytes,Ua: NEGATIVE
Nitrite: NEGATIVE
Protein, ur: NEGATIVE mg/dL
Specific Gravity, Urine: 1.014 (ref 1.005–1.030)
pH: 5 (ref 5.0–8.0)

## 2023-06-23 LAB — ABO/RH: ABO/RH(D): O NEG

## 2023-06-23 LAB — APTT: aPTT: 37 seconds — ABNORMAL HIGH (ref 24–36)

## 2023-06-23 LAB — CREATININE, SERUM
Creatinine, Ser: 1.26 mg/dL — ABNORMAL HIGH (ref 0.61–1.24)
GFR, Estimated: >60 mL/min (ref >60)

## 2023-06-23 SURGERY — REPAIR, PSEUDOANEURYSM
Anesthesia: Regional | Site: Arm Lower | Laterality: Right

## 2023-06-23 MED ORDER — SODIUM CHLORIDE 0.9 % IV SOLN: INTRAVENOUS | Status: DC

## 2023-06-23 MED ORDER — HEMOSTATIC AGENTS (NO CHARGE) OPTIME
TOPICAL | Status: DC | PRN
  Administered 2023-06-23: 1 via TOPICAL

## 2023-06-23 MED ORDER — FENTANYL CITRATE (PF) 100 MCG/2ML IJ SOLN: 50.0000 ug | Freq: Once | INTRAMUSCULAR | Status: AC

## 2023-06-23 MED ORDER — MIDAZOLAM HCL 2 MG/2ML IJ SOLN: 0.5000 mg | Freq: Once | INTRAMUSCULAR | Status: DC | PRN

## 2023-06-23 MED ORDER — CHLORHEXIDINE GLUCONATE CLOTH 2 % EX PADS: 6.0000 | MEDICATED_PAD | Freq: Once | CUTANEOUS | Status: DC

## 2023-06-23 MED ORDER — SODIUM CHLORIDE 0.9% FLUSH
3.0000 mL | Freq: Two times a day (BID) | INTRAVENOUS | Status: DC
  Administered 2023-06-23 – 2023-06-24 (×3): 3 mL via INTRAVENOUS

## 2023-06-23 MED ORDER — GUAIFENESIN-DM 100-10 MG/5ML PO SYRP: 15.0000 mL | ORAL_SOLUTION | ORAL | Status: DC | PRN

## 2023-06-23 MED ORDER — LYSINE HCL 1000 MG PO TABS: 1000.0000 mg | ORAL_TABLET | Freq: Two times a day (BID) | ORAL | Status: DC

## 2023-06-23 MED ORDER — CHLORHEXIDINE GLUCONATE 0.12 % MT SOLN: 15.0000 mL | Freq: Once | OROMUCOSAL | Status: AC

## 2023-06-23 MED ORDER — LIDOCAINE-EPINEPHRINE (PF) 1 %-1:200000 IJ SOLN
INTRAMUSCULAR | Status: DC | PRN
  Administered 2023-06-23: 5 mL

## 2023-06-23 MED ORDER — ALUM & MAG HYDROXIDE-SIMETH 200-200-20 MG/5ML PO SUSP: 15.0000 mL | ORAL | Status: DC | PRN

## 2023-06-23 MED ORDER — HEPARIN SODIUM (PORCINE) 1000 UNIT/ML IJ SOLN
INTRAMUSCULAR | Status: DC | PRN
  Administered 2023-06-23: 5000 [IU] via INTRAVENOUS

## 2023-06-23 MED ORDER — HYDROMORPHONE HCL 1 MG/ML IJ SOLN: 0.5000 mg | INTRAMUSCULAR | Status: DC | PRN

## 2023-06-23 MED ORDER — LOSARTAN POTASSIUM 50 MG PO TABS
100.0000 mg | ORAL_TABLET | Freq: Every day | ORAL | Status: DC
  Administered 2023-06-23 – 2023-06-24 (×2): 100 mg via ORAL
  Filled 2023-06-23 (×2): qty 2

## 2023-06-23 MED ORDER — CHLORHEXIDINE GLUCONATE 0.12 % MT SOLN
OROMUCOSAL | Status: AC
  Administered 2023-06-23: 15 mL via OROMUCOSAL
  Filled 2023-06-23: qty 15

## 2023-06-23 MED ORDER — ACETAMINOPHEN 500 MG PO TABS
1000.0000 mg | ORAL_TABLET | Freq: Once | ORAL | Status: AC
  Administered 2023-06-23: 1000 mg via ORAL
  Filled 2023-06-23: qty 2

## 2023-06-23 MED ORDER — OXYCODONE HCL 5 MG/5ML PO SOLN: 5.0000 mg | Freq: Once | ORAL | Status: AC | PRN

## 2023-06-23 MED ORDER — HYDRALAZINE HCL 50 MG PO TABS
50.0000 mg | ORAL_TABLET | Freq: Three times a day (TID) | ORAL | Status: DC
  Administered 2023-06-23 – 2023-06-24 (×3): 50 mg via ORAL
  Filled 2023-06-23 (×3): qty 1

## 2023-06-23 MED ORDER — DOCUSATE SODIUM 100 MG PO CAPS
100.0000 mg | ORAL_CAPSULE | Freq: Two times a day (BID) | ORAL | Status: DC
  Administered 2023-06-23 – 2023-06-24 (×2): 100 mg via ORAL
  Filled 2023-06-23 (×2): qty 1

## 2023-06-23 MED ORDER — HEPARIN 6000 UNIT IRRIGATION SOLUTION
Status: DC | PRN
  Administered 2023-06-23: 1

## 2023-06-23 MED ORDER — ONDANSETRON HCL 4 MG/2ML IJ SOLN
4.0000 mg | Freq: Four times a day (QID) | INTRAMUSCULAR | Status: DC | PRN
  Administered 2023-06-23: 4 mg via INTRAVENOUS
  Filled 2023-06-23: qty 2

## 2023-06-23 MED ORDER — MEPERIDINE HCL 25 MG/ML IJ SOLN: 6.2500 mg | INTRAMUSCULAR | Status: DC | PRN

## 2023-06-23 MED ORDER — OXYCODONE HCL 5 MG PO TABS
5.0000 mg | ORAL_TABLET | Freq: Once | ORAL | Status: AC | PRN
  Administered 2023-06-23: 5 mg via ORAL

## 2023-06-23 MED ORDER — PROTAMINE SULFATE 10 MG/ML IV SOLN
INTRAVENOUS | Status: DC | PRN
  Administered 2023-06-23: 30 mg via INTRAVENOUS

## 2023-06-23 MED ORDER — FINASTERIDE 5 MG PO TABS
5.0000 mg | ORAL_TABLET | Freq: Every day | ORAL | Status: DC
  Administered 2023-06-23 – 2023-06-24 (×2): 5 mg via ORAL
  Filled 2023-06-23 (×3): qty 1

## 2023-06-23 MED ORDER — LABETALOL HCL 5 MG/ML IV SOLN: 10.0000 mg | INTRAVENOUS | Status: DC | PRN

## 2023-06-23 MED ORDER — FENTANYL CITRATE (PF) 100 MCG/2ML IJ SOLN
25.0000 ug | INTRAMUSCULAR | Status: DC | PRN
  Administered 2023-06-23: 50 ug via INTRAVENOUS
  Administered 2023-06-23: 25 ug via INTRAVENOUS

## 2023-06-23 MED ORDER — SODIUM CHLORIDE 0.9% FLUSH: 3.0000 mL | INTRAVENOUS | Status: DC | PRN

## 2023-06-23 MED ORDER — ASPIRIN 81 MG PO TBEC
81.0000 mg | DELAYED_RELEASE_TABLET | Freq: Every day | ORAL | Status: DC
  Administered 2023-06-23 – 2023-06-24 (×2): 81 mg via ORAL
  Filled 2023-06-23 (×2): qty 1

## 2023-06-23 MED ORDER — FENTANYL CITRATE (PF) 100 MCG/2ML IJ SOLN
INTRAMUSCULAR | Status: AC
  Filled 2023-06-23: qty 2

## 2023-06-23 MED ORDER — LITHIUM CARBONATE 300 MG PO CAPS
450.0000 mg | ORAL_CAPSULE | Freq: Every day | ORAL | Status: DC
  Administered 2023-06-23: 450 mg via ORAL
  Filled 2023-06-23 (×2): qty 1

## 2023-06-23 MED ORDER — HEPARIN 6000 UNIT IRRIGATION SOLUTION
Status: AC
  Filled 2023-06-23: qty 500

## 2023-06-23 MED ORDER — MIDAZOLAM HCL 2 MG/2ML IJ SOLN
INTRAMUSCULAR | Status: AC
  Administered 2023-06-23: 1 mg via INTRAVENOUS
  Filled 2023-06-23: qty 2

## 2023-06-23 MED ORDER — LIDOCAINE-EPINEPHRINE (PF) 1.5 %-1:200000 IJ SOLN
INTRAMUSCULAR | Status: DC | PRN
  Administered 2023-06-23: 25 mL via PERINEURAL

## 2023-06-23 MED ORDER — POTASSIUM CHLORIDE CRYS ER 20 MEQ PO TBCR
20.0000 meq | EXTENDED_RELEASE_TABLET | Freq: Once | ORAL | Status: DC
  Filled 2023-06-23: qty 2

## 2023-06-23 MED ORDER — SENNOSIDES-DOCUSATE SODIUM 8.6-50 MG PO TABS: 1.0000 | ORAL_TABLET | Freq: Every evening | ORAL | Status: DC | PRN

## 2023-06-23 MED ORDER — PROMETHAZINE HCL 25 MG/ML IJ SOLN: 6.2500 mg | INTRAMUSCULAR | Status: DC | PRN

## 2023-06-23 MED ORDER — LACTATED RINGERS IV SOLN: INTRAVENOUS | Status: DC

## 2023-06-23 MED ORDER — LEFLUNOMIDE 10 MG PO TABS
20.0000 mg | ORAL_TABLET | Freq: Every day | ORAL | Status: DC
  Administered 2023-06-24: 20 mg via ORAL
  Filled 2023-06-23: qty 2

## 2023-06-23 MED ORDER — ACETAMINOPHEN 325 MG PO TABS
325.0000 mg | ORAL_TABLET | ORAL | Status: DC | PRN
  Administered 2023-06-24: 650 mg via ORAL
  Filled 2023-06-23: qty 2

## 2023-06-23 MED ORDER — ORAL CARE MOUTH RINSE: 15.0000 mL | Freq: Once | OROMUCOSAL | Status: AC

## 2023-06-23 MED ORDER — HEPARIN SODIUM (PORCINE) 5000 UNIT/ML IJ SOLN
5000.0000 [IU] | Freq: Three times a day (TID) | INTRAMUSCULAR | Status: DC
  Administered 2023-06-24: 5000 [IU] via SUBCUTANEOUS
  Filled 2023-06-23: qty 1

## 2023-06-23 MED ORDER — HYDRALAZINE HCL 20 MG/ML IJ SOLN: 5.0000 mg | INTRAMUSCULAR | Status: DC | PRN

## 2023-06-23 MED ORDER — EZETIMIBE 10 MG PO TABS
10.0000 mg | ORAL_TABLET | Freq: Every day | ORAL | Status: DC
  Administered 2023-06-24: 10 mg via ORAL
  Filled 2023-06-23: qty 1

## 2023-06-23 MED ORDER — PANTOPRAZOLE SODIUM 40 MG PO TBEC: 40.0000 mg | DELAYED_RELEASE_TABLET | Freq: Every day | ORAL | Status: DC

## 2023-06-23 MED ORDER — SODIUM CHLORIDE 0.9 % IV SOLN: 250.0000 mL | INTRAVENOUS | Status: DC | PRN

## 2023-06-23 MED ORDER — OXYCODONE HCL 5 MG PO TABS
ORAL_TABLET | ORAL | Status: AC
  Filled 2023-06-23: qty 1

## 2023-06-23 MED ORDER — FENTANYL CITRATE (PF) 250 MCG/5ML IJ SOLN
INTRAMUSCULAR | Status: AC
  Filled 2023-06-23: qty 5

## 2023-06-23 MED ORDER — METOPROLOL TARTRATE 5 MG/5ML IV SOLN: 2.0000 mg | INTRAVENOUS | Status: DC | PRN

## 2023-06-23 MED ORDER — ONDANSETRON HCL 4 MG/2ML IJ SOLN
INTRAMUSCULAR | Status: DC | PRN
Start: 2023-06-23 — End: 2023-06-23
  Administered 2023-06-23: 4 mg via INTRAVENOUS

## 2023-06-23 MED ORDER — PANTOPRAZOLE SODIUM 40 MG PO TBEC
40.0000 mg | DELAYED_RELEASE_TABLET | Freq: Every day | ORAL | Status: DC
  Administered 2023-06-24: 40 mg via ORAL
  Filled 2023-06-23: qty 1

## 2023-06-23 MED ORDER — CEFAZOLIN SODIUM-DEXTROSE 2-4 GM/100ML-% IV SOLN
2.0000 g | INTRAVENOUS | Status: AC
  Administered 2023-06-23: 2 g via INTRAVENOUS

## 2023-06-23 MED ORDER — FENTANYL CITRATE (PF) 250 MCG/5ML IJ SOLN
INTRAMUSCULAR | Status: DC | PRN
  Administered 2023-06-23 (×2): 50 ug via INTRAVENOUS

## 2023-06-23 MED ORDER — NITROGLYCERIN 0.4 MG SL SUBL: 0.4000 mg | SUBLINGUAL_TABLET | SUBLINGUAL | Status: DC | PRN

## 2023-06-23 MED ORDER — OXYCODONE-ACETAMINOPHEN 5-325 MG PO TABS: 1.0000 | ORAL_TABLET | ORAL | Status: DC | PRN

## 2023-06-23 MED ORDER — CEFAZOLIN SODIUM-DEXTROSE 2-4 GM/100ML-% IV SOLN
INTRAVENOUS | Status: AC
  Filled 2023-06-23: qty 100

## 2023-06-23 MED ORDER — 0.9 % SODIUM CHLORIDE (POUR BTL) OPTIME
TOPICAL | Status: DC | PRN
  Administered 2023-06-23: 1000 mL

## 2023-06-23 MED ORDER — ACETAMINOPHEN 650 MG RE SUPP: 325.0000 mg | RECTAL | Status: DC | PRN

## 2023-06-23 MED ORDER — FENTANYL CITRATE (PF) 100 MCG/2ML IJ SOLN
INTRAMUSCULAR | Status: AC
  Administered 2023-06-23: 50 ug via INTRAVENOUS
  Filled 2023-06-23: qty 2

## 2023-06-23 MED ORDER — SIMVASTATIN 5 MG PO TABS
5.0000 mg | ORAL_TABLET | Freq: Every day | ORAL | Status: DC
  Administered 2023-06-24: 5 mg via ORAL
  Filled 2023-06-23: qty 1

## 2023-06-23 MED ORDER — MIDAZOLAM HCL 2 MG/2ML IJ SOLN: 1.0000 mg | Freq: Once | INTRAMUSCULAR | Status: AC

## 2023-06-23 MED ORDER — PHENOL 1.4 % MT LIQD: 1.0000 | OROMUCOSAL | Status: DC | PRN

## 2023-06-23 MED ORDER — PROPOFOL 500 MG/50ML IV EMUL
INTRAVENOUS | Status: DC | PRN
  Administered 2023-06-23: 30 mg via INTRAVENOUS
  Administered 2023-06-23: 100 ug/kg/min via INTRAVENOUS

## 2023-06-23 MED ORDER — PROPOFOL 10 MG/ML IV BOLUS
INTRAVENOUS | Status: DC | PRN
Start: 2023-06-23 — End: 2023-06-23
  Administered 2023-06-23: 50 mg via INTRAVENOUS

## 2023-06-23 MED ORDER — AMLODIPINE BESYLATE 10 MG PO TABS
10.0000 mg | ORAL_TABLET | Freq: Every day | ORAL | Status: DC
  Administered 2023-06-24: 10 mg via ORAL
  Filled 2023-06-23: qty 1

## 2023-06-23 SURGICAL SUPPLY — 54 items
ADH SKN CLS APL DERMABOND .7 (GAUZE/BANDAGES/DRESSINGS) ×1
BAG COUNTER SPONGE SURGICOUNT (BAG) ×1 IMPLANT
BAG SPNG CNTER NS LX DISP (BAG) ×1
BANDAGE ESMARK 6X9 LF (GAUZE/BANDAGES/DRESSINGS) IMPLANT
BNDG CMPR 5X4 KNIT ELC UNQ LF (GAUZE/BANDAGES/DRESSINGS) ×1
BNDG CMPR 9X4 STRL LF SNTH (GAUZE/BANDAGES/DRESSINGS) ×1
BNDG CMPR 9X6 STRL LF SNTH (GAUZE/BANDAGES/DRESSINGS)
BNDG ELASTIC 4INX 5YD STR LF (GAUZE/BANDAGES/DRESSINGS) IMPLANT
BNDG ELASTIC 4X5.8 VLCR STR LF (GAUZE/BANDAGES/DRESSINGS) IMPLANT
BNDG ESMARK 4X9 LF (GAUZE/BANDAGES/DRESSINGS) IMPLANT
BNDG ESMARK 6X9 LF (GAUZE/BANDAGES/DRESSINGS)
CANISTER SUCT 3000ML PPV (MISCELLANEOUS) ×1 IMPLANT
CLIP TI MEDIUM 24 (CLIP) ×1 IMPLANT
CLIP TI MEDIUM 6 (CLIP) IMPLANT
CLIP TI WIDE RED SMALL 24 (CLIP) ×1 IMPLANT
CLIP TI WIDE RED SMALL 6 (CLIP) IMPLANT
COVER PROBE W GEL 5X96 (DRAPES) IMPLANT
CUFF TOURN SGL QUICK 18X4 (TOURNIQUET CUFF) IMPLANT
CUFF TOURN SGL QUICK 24 (TOURNIQUET CUFF)
CUFF TOURN SGL QUICK 34 (TOURNIQUET CUFF)
CUFF TOURN SGL QUICK 42 (TOURNIQUET CUFF) IMPLANT
CUFF TRNQT CYL 24X4X16.5-23 (TOURNIQUET CUFF) IMPLANT
CUFF TRNQT CYL 34X4.125X (TOURNIQUET CUFF) IMPLANT
DERMABOND ADVANCED .7 DNX12 (GAUZE/BANDAGES/DRESSINGS) ×1 IMPLANT
DRAIN CHANNEL 15F RND FF W/TCR (WOUND CARE) IMPLANT
DRSG OPSITE POSTOP 3X4 (GAUZE/BANDAGES/DRESSINGS) IMPLANT
ELECT REM PT RETURN 9FT ADLT (ELECTROSURGICAL) ×1
ELECTRODE REM PT RTRN 9FT ADLT (ELECTROSURGICAL) ×1 IMPLANT
EVACUATOR SILICONE 100CC (DRAIN) IMPLANT
GAUZE XEROFORM 1X8 LF (GAUZE/BANDAGES/DRESSINGS) IMPLANT
GLOVE BIOGEL PI IND STRL 8 (GLOVE) ×1 IMPLANT
GOWN STRL REUS W/ TWL LRG LVL3 (GOWN DISPOSABLE) ×2 IMPLANT
GOWN STRL REUS W/TWL 2XL LVL3 (GOWN DISPOSABLE) ×2 IMPLANT
GOWN STRL REUS W/TWL LRG LVL3 (GOWN DISPOSABLE) ×2
HEMOSTAT SNOW SURGICEL 2X4 (HEMOSTASIS) IMPLANT
KIT BASIN OR (CUSTOM PROCEDURE TRAY) ×1 IMPLANT
KIT TURNOVER KIT B (KITS) ×1 IMPLANT
NS IRRIG 1000ML POUR BTL (IV SOLUTION) ×2 IMPLANT
PACK CV ACCESS (CUSTOM PROCEDURE TRAY) IMPLANT
PACK PERIPHERAL VASCULAR (CUSTOM PROCEDURE TRAY) ×1 IMPLANT
PAD ARMBOARD 7.5X6 YLW CONV (MISCELLANEOUS) ×2 IMPLANT
STAPLER VISISTAT 35W (STAPLE) IMPLANT
SUT MNCRL AB 4-0 PS2 18 (SUTURE) ×1 IMPLANT
SUT PROLENE 5 0 C 1 24 (SUTURE) ×1 IMPLANT
SUT PROLENE 6 0 BV (SUTURE) IMPLANT
SUT VIC AB 2-0 CT1 27 (SUTURE)
SUT VIC AB 2-0 CT1 TAPERPNT 27 (SUTURE) ×1 IMPLANT
SUT VIC AB 3-0 SH 27 (SUTURE) ×1
SUT VIC AB 3-0 SH 27X BRD (SUTURE) ×1 IMPLANT
SYR CONTROL 10ML LL (SYRINGE) IMPLANT
TOWEL GREEN STERILE (TOWEL DISPOSABLE) ×1 IMPLANT
TRAY FOLEY MTR SLVR 16FR STAT (SET/KITS/TRAYS/PACK) ×1 IMPLANT
UNDERPAD 30X36 HEAVY ABSORB (UNDERPADS AND DIAPERS) ×1 IMPLANT
WATER STERILE IRR 1000ML POUR (IV SOLUTION) ×1 IMPLANT

## 2023-06-23 NOTE — Anesthesia Preprocedure Evaluation (Addendum)
Anesthesia Evaluation  Patient identified by MRN, date of birth, ID band Patient awake    Reviewed: Allergy & Precautions, NPO status , Patient's Chart, lab work & pertinent test results  History of Anesthesia Complications Negative for: history of anesthetic complications  Airway Mallampati: II  TM Distance: >3 FB Neck ROM: Full    Dental  (+) Missing, Dental Advisory Given, Chipped   Pulmonary former smoker   breath sounds clear to auscultation       Cardiovascular hypertension, Pt. on medications + angina (cardiology aware, recent cath)  + CAD, + Cardiac Stents and + Peripheral Vascular Disease   Rhythm:Regular Rate:Normal  05/2023 cath: Stable findings on angiography with widely patent PDA stent. Patient has LVH on echo, could consider microvascular disease.  12/2022 ECHO: EF 60 to 65%.  1. The LV has normal function, no regional wall motion abnormalities. There is moderate concentric LVH. Left ventricular diastolic parameters were normal.   2. RVF is normal. The right ventricular size is normal. Tricuspid regurgitation signal is inadequate for assessing PA pressure.   3. The mitral valve is normal in structure. Mild MR. No evidence of mitral stenosis.   4. The aortic valve is tricuspid. Aortic valve regurgitation is not visualized. Aortic valve sclerosis/calcification is present, without any evidence of aortic stenosis.     Neuro/Psych   Anxiety Depression Bipolar Disorder   CVA, No Residual Symptoms    GI/Hepatic Neg liver ROS,GERD  Medicated and Controlled,,  Endo/Other  negative endocrine ROS    Renal/GU Renal InsufficiencyRenal disease     Musculoskeletal   Abdominal   Peds  Hematology eliquis   Anesthesia Other Findings   Reproductive/Obstetrics                             Anesthesia Physical Anesthesia Plan  ASA: 3  Anesthesia Plan: Regional and MAC   Post-op Pain Management:  Tylenol PO (pre-op)*   Induction:   PONV Risk Score and Plan: 1 and Ondansetron and Treatment may vary due to age or medical condition  Airway Management Planned: Natural Airway and Simple Face Mask  Additional Equipment: None  Intra-op Plan:   Post-operative Plan:   Informed Consent: I have reviewed the patients History and Physical, chart, labs and discussed the procedure including the risks, benefits and alternatives for the proposed anesthesia with the patient or authorized representative who has indicated his/her understanding and acceptance.     Dental advisory given  Plan Discussed with: CRNA and Surgeon  Anesthesia Plan Comments:         Anesthesia Quick Evaluation

## 2023-06-23 NOTE — Progress Notes (Signed)
Pt admitted for  Right ulnar Artery pseudoaneurysm repair, R Lower Arm. Pt A&Ox4. Pt had no c/o of pain, no s/s of bleeding. Pt placed on tele. Given meds, oriented to room. No skin issues noted.  BP (!) 179/60 (BP Location: Left Arm)   Pulse (!) 49   Temp 97.7 F (36.5 C)   Resp 14   Ht 5\' 7"  (1.702 m)   Wt 73.5 kg   SpO2 97%   BMI 25.37 kg/m  No other needs voiced at this time. Reva Bores 06/23/23 3:49 PM

## 2023-06-23 NOTE — Anesthesia Procedure Notes (Signed)
Anesthesia Regional Block: Supraclavicular block   Pre-Anesthetic Checklist: , timeout performed,  Correct Patient, Correct Site, Correct Laterality,  Correct Procedure, Correct Position, site marked,  Risks and benefits discussed,  Surgical consent,  Pre-op evaluation,  At surgeon's request and post-op pain management  Laterality: Right and Upper  Prep: chloraprep       Needles:  Injection technique: Single-shot  Needle Type: Echogenic Needle     Needle Length: 9cm  Needle Gauge: 21     Additional Needles:   Procedures:,,,, ultrasound used (permanent image in chart),,    Narrative:  Start time: 06/23/2023 11:23 AM End time: 06/23/2023 11:29 AM Injection made incrementally with aspirations every 5 mL.  Performed by: Personally  Anesthesiologist: Jairo Ben, MD  Additional Notes: Pt identified in Holding room.  Monitors applied. Working IV access confirmed. Sterile prep R clavicle and neck.  #21ga ECHOgenic Arrow block needle to supraclavicular brachial plexus with US guidance.  25cc 1.5% Lidocaine 1:200k epi injected incrementally after negative test dose.  Patient asymptomatic, VSS, no heme aspirated, tolerated well.   Sandford Craze, MD

## 2023-06-23 NOTE — Transfer of Care (Signed)
Immediate Anesthesia Transfer of Care Note  Patient: Noah Grant  Procedure(s) Performed: RIGHT ULNAR ARTERY PSEUDOANEURYSM REPAIR (Right: Arm Lower)  Patient Location: PACU  Anesthesia Type:General  Level of Consciousness: drowsy and responds to stimulation  Airway & Oxygen Therapy: Patient Spontanous Breathing  Post-op Assessment: Report given to RN and Post -op Vital signs reviewed and stable  Post vital signs: Reviewed and stable  Last Vitals:  Vitals Value Taken Time  BP 159/79 06/23/23 1305  Temp    Pulse 64 06/23/23 1306  Resp 15 06/23/23 1306  SpO2 98 % 06/23/23 1306  Vitals shown include unfiled device data.  Last Pain:  Vitals:   06/23/23 1145  TempSrc:   PainSc: 0-No pain         Complications: No notable events documented.

## 2023-06-23 NOTE — H&P (Signed)
Hospital Consult  Patient seen and examined in preop holding.  No complaints. No changes to medication history or physical exam since last seen in clinic. After discussing the risks and benefits of right ulnar artery pseudoaneurysm repair, Noah Grant elected to proceed.   Noah Sparrow MD    Reason for Consult: Right ulnar pseudoaneurysm Requesting Physician: ED MRN #:  [id number]  History of Present Illness: This is a 72 y.o. male status post cardiac cath on 06/06/2023 with iatrogenic right ulnar artery pseudoaneurysm.  He was recently seen at [facility] land with interval growth, and vascular surgery was called for further recommendations.  I asked that he present to [facility] for evaluation.  On exam, Noah Grant was doing well.  He noted tenderness at the pseudoaneurysm site, but no unilateral numbness tingling or motor dysfunction.  Noah Grant has rheumatoid arthritis at baseline, and has pain in bilateral hands. The aneurysm is grown from 2 cm to 3 cm.  Noah Grant is on Eliquis for intermittent A-fib  Past Medical History:  Diagnosis Date   Abdominal aortic atherosclerosis (HCC)    Anemia    likely of chronic disease   Angina pectoris (HCC) 08/30/2020   Arthritis    rheumatoid   Atrial fibrillation (HCC)    Atrial fibrillation with rapid ventricular response (HCC) 05/12/2022   06/20/23 - pt states he's not had any A-fib   Barrett's esophagus without dysplasia 03/25/2019   Formatting of this note might be different from the original. EGD done in 02/2019. Due in 3 years.   Benign prostatic hyperplasia with nocturia 12/29/2018   Bipolar 1 disorder (HCC)    Bipolar 1 disorder, mixed, moderate (HCC) 08/16/2014   Bipolar 2 disorder (HCC) 08/16/2014   Bipolar affective disorder, currently depressed, moderate (HCC) 03/08/2020   Bipolar depression (HCC) 08/16/2014   BPH with urinary obstruction 12/29/2018   Brain ischemia 03/08/2020   Cardiac murmur 08/30/2020   Chest pain 09/06/2020    Chicken pox    Chronic bronchitis (HCC)    Chronic kidney disease, stage 3a (HCC) 07/21/2019   Cognitive complaints 03/08/2020   Coronary artery disease involving native coronary artery of native heart 09/06/2020   Coronary artery disease involving native coronary artery of native heart with angina pectoris (HCC) 09/06/2020   Depression    Disc degeneration, 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 Last Assessment & Plan:  Well controlled. Continue current medications.   GERD without esophagitis 08/16/2014   Formatting of this note might be different from the original. Last Assessment & Plan:  Well controlled. Continue current medications.   Herpes gingivostomatitis 02/10/2015   High risk medication use 03/23/2018   History of colon polyps 03/25/2019   Formatting of this note might be different from the original. tubular adenoma   History of stroke 11/10/2019   Hyperlipidemia    Hyperphosphatemia 10/30/2022   Hypertension    Ingrowing nail 03/12/2020   Intention tremor 06/16/2019   Labile hypertension 08/16/2014   Formatting of this note might be different from the original. Last Assessment & Plan:  Slightly above goal today secondary to pain. Will continue current regimen. Will check CMP today.   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 Concern over possible lichen planus. His dentist noticed lesions of his oral mucosa.  It was felt to be lichen planus.  He presents to discuss further.  Rarely does the area hurt.He smoked in the distant past EXAM shows several white patches on the buccal mucosa bilaterally.  No other concerning oral  mucosal les   Lithium use 10/30/2022   Long-term use of immunosuppressant medication 03/23/2018   Low testosterone 09/17/2016   Midline thoracic back pain 02/10/2015   Mixed hyperlipidemia 08/16/2014   Myocarditis (HCC) 05/14/2022   Normochromic normocytic anemia 08/16/2014   Normocytic anemia    likely of chronic disease   PAD (peripheral artery disease) (HCC) 03/23/2019   Pain of right hip joint 01/19/2016   Pericarditis 05/12/2022   Rheumatic fever    Rheumatoid arthritis (HCC) 10/29/2018   Formatting of this note might be different from the original. Rheum:  Dr. Sharmon Revere Formerly on MTX - stopped due to mouth sores. Currently started on Arava.  Also on 5 mg prednisone x 3 months during run-in.   Right-sided chest wall pain 04/17/2019   Seasonal allergies 03/23/2018   Sepsis (HCC) 05/11/2022   Stasis dermatitis of both legs 07/29/2022   Statin intolerance 07/31/2020   Stroke (HCC)    3 - TIA's   Task-specific dystonia of hand    right hand   Thoracic radiculopathy 06/16/2019   Vitamin B12 deficiency    Injections   Vitamin D deficiency 09/17/2016    Past Surgical History:  Procedure Laterality Date   CLAVICLE SURGERY     Right, Hardware placed   CORONARY STENT INTERVENTION N/A 09/06/2020   Procedure: CORONARY STENT INTERVENTION;  Surgeon: Tonny Bollman, MD;  Location: [facility] INVASIVE CV LAB;  Service: Cardiovascular;  Laterality: N/A;   ingrown toenail Bilateral    great toes   LEFT HEART CATH AND CORONARY ANGIOGRAPHY N/A 09/06/2020   Procedure: LEFT HEART CATH AND CORONARY ANGIOGRAPHY;  Surgeon: Tonny Bollman, MD;  Location: [facility] INVASIVE CV LAB;  Service: Cardiovascular;  Laterality: N/A;   LEFT HEART CATH AND CORONARY ANGIOGRAPHY N/A 06/06/2023   Procedure: LEFT HEART CATH AND CORONARY ANGIOGRAPHY;  Surgeon: Marykay Lex, MD;  Location[facility] INVASIVE CV LAB;  Service: Cardiovascular;  Laterality: N/A;   WISDOM TOOTH EXTRACTION      Allergies  Allergen Reactions    Methotrexate Dermatitis    Developed Mouth Sores   Nsaids Dermatitis    Developed Mouth Blisters   Plaquenil [Hydroxychloroquine] Other (See Comments)    Terrible Nightmares   Azulfidine [Sulfasalazine] Nausea Only   Crestor [Rosuvastatin] Other (See Comments)    Caused abdominal pain that radiated to the back   Iodinated Contrast Media     GI Intolerance, Made him severely sick   Isosorbide Other (See Comments)    Made him feel weak   Mevacor [Lovastatin] Other (See Comments)    Caused abdominal pain that radiated to the back   Pravachol [Pravastatin] Other (See Comments)    Caused abdominal pain that radiated to the back   Ranexa [Ranolazine Er] Nausea Only    Severe nausea    Prior to Admission medications   Medication Sig Start Date End Date Taking? Authorizing Provider  acetaminophen (TYLENOL) 650 MG CR tablet Take 1,300 mg by mouth every 8 (eight) hours as needed for pain.    [provider]  amLODipine (NORVASC) 5 MG tablet Take 2 tablets (10 mg total) by mouth daily. 02/24/23  Sharlene Dory, DO  Cholecalciferol 50 MCG (2000 UT) CAPS Take 2,000 Units by mouth daily.     [provider]  Coenzyme Q10 (COQ10) 200 MG CAPS Take 200 mg by mouth daily.    [provider]  diclofenac Sodium (VOLTAREN) 1 % GEL Apply 1 application  topically daily as needed for pain.    [provider]  ELIQUIS 5 MG TABS tablet TAKE 1 TABLET BY MOUTH TWICE A DAY 03/25/23   [name], Jilda Roche, DO  ezetimibe (ZETIA) 10 MG tablet Take 1 tablet (10 mg total) by mouth daily. 09/30/22   Revankar, Aundra Dubin, MD  finasteride (PROSCAR) 5 MG tablet TAKE 1 TABLET BY MOUTH DAILY 01/13/23   Sharlene Dory, DO  hydrALAZINE (APRESOLINE) 50 MG tablet Take 50 mg by mouth 3 (three) times daily.    [provider]  leflunomide (ARAVA) 20 MG tablet TAKE 1 TABLET BY MOUTH DAILY 04/21/23   Rice, Jamesetta Orleans, MD  lithium carbonate 150 MG capsule Take 3  capsules (450 mg total) by mouth daily. 05/07/23   Cottle, Steva Ready., MD  losartan (COZAAR) 100 MG tablet Take 100 mg by mouth daily.    [provider]  Lysine HCl 1000 MG TABS Take 1,000 mg by mouth 2 (two) times daily.     [provider]  nitroGLYCERIN (NITROSTAT) 0.4 MG SL tablet Place 1 tablet (0.4 mg total) under the tongue every 5 (five) minutes as needed for chest pain. 04/04/23   Revankar, Aundra Dubin, MD  omeprazole (PRILOSEC) 40 MG capsule Take 1 capsule (40 mg total) by mouth in the morning and at bedtime. 02/24/23   Sharlene Dory, DO  simvastatin (ZOCOR) 5 MG tablet TAKE ONE TABLET BY MOUTH DAILY 09/30/22   Sharlene Dory, DO  triamcinolone cream (KENALOG) 0.1 % Apply 1 Application topically 2 (two) times daily. 06/13/23   Terrilee Files, MD  vitamin B-12 (CYANOCOBALAMIN) 1000 MCG tablet Take 1,000 mcg by mouth daily.    [provider]    Social History   Socioeconomic History   Marital status: Married    Spouse name: Not on file   Number of children: Not on file   Years of education: Not on file   Highest education level: 12th grade  Occupational History   Not on file  Tobacco Use   Smoking status: Former    Current packs/day: 0.00    Average packs/day: 2.0 packs/day for 10.0 years (20.0 ttl pk-yrs)    Types: Cigarettes    Start date: 3    Quit date: 52    Years since quitting: 50.5    Passive exposure: Past   Smokeless tobacco: Never   Tobacco comments:    Quit>40 years  Vaping Use   Vaping status: Never Used  Substance and Sexual Activity   Alcohol use: Not Currently    Alcohol/week: 0.0 standard drinks of alcohol   Drug use: Never   Sexual activity: Not on file  Other Topics Concern   Not on file  Social History Narrative   Not on file   Social Determinants of Health   Financial Resource Strain: Low Risk  (03/20/2023)   Overall Financial Resource Strain (CARDIA)    Difficulty of Paying Living Expenses:  Not hard at all  Food Insecurity: No Food Insecurity (03/20/2023)   Hunger Vital Sign    Worried About Running Out of Food in the Last Year: Never true    Ran Out  of Food in the Last Year: Never true  Transportation Needs: No Transportation Needs (03/20/2023)   PRAPARE - Administrator, Civil Service (Medical): No    Lack of Transportation (Non-Medical): No  Physical Activity: Inactive (03/20/2023)   Exercise Vital Sign    Days of Exercise per Week: 0 days    Minutes of Exercise per Session: 0 min  Stress: Stress Concern Present (03/20/2023)   [employer] of Occupational Health - Occupational Stress Questionnaire    Feeling of Stress : Rather much  Social Connections: Unknown (03/20/2023)   Social Connection and Isolation Panel [NHANES]    Frequency of Communication with Friends and Family: Never    Frequency of Social Gatherings with Friends and Family: Patient declined    Attends Religious Services: Never    Database administrator or Organizations: No    Attends Engineer, structural: Never    Marital Status: Married  Recent Concern: Social Connections - Socially Isolated (01/22/2023)   Social Connection and Isolation Panel [NHANES]    Frequency of Communication with Friends and Family: Once a week    Frequency of Social Gatherings with Friends and Family: Once a week    Attends Religious Services: Never    Database administrator or Organizations: No    Attends Banker Meetings: Never    Marital Status: Married  Catering manager Violence: Not At Risk (01/22/2023)   Humiliation, Afraid, Rape, and Kick questionnaire    Fear of Current or Ex-Partner: No    Emotionally Abused: No    Physically Abused: No    Sexually Abused: No   Family History  Problem Relation Age of Onset   Alzheimer's disease Mother 88       Deceased in early 75s   Stroke Father 44       Deceased   Hypertension Father    Alcoholism Father    Alcoholism Brother    Heart  disease Maternal Grandfather 40       Deceased   Heart disease Paternal Grandfather 56       Deceased   Hypertension Son     ROS: Otherwise negative unless mentioned in HPI  Physical Examination  Vitals:   06/23/23 0822  BP: (!) 132/59  Pulse: 64  Resp: 18  Temp: 98.1 F (36.7 C)  SpO2: 99%   Body mass index is 25.37 kg/m.  General:  WDWN in NAD Gait: Not observed HENT: WNL, normocephalic Pulmonary: normal non-labored breathing, without Rales, rhonchi,  wheezing Cardiac: regular Abdomen:  soft, NT/ND, no masses Skin: without rashes Vascular Exam/Pulses: 2+ radial, 2+ ulnar pulses, palpable mass in the right wrist at the site of the ulnar artery. Extremities: without ischemic changes, without Gangrene , without cellulitis; without open wounds;  Musculoskeletal: no muscle wasting or atrophy  Neurologic: A&O X 3;  No focal weakness or paresthesias are detected; speech is fluent/normal Psychiatric:  The pt has Normal affect. Lymph:  Unremarkable  CBC    Component Value Date/Time   WBC 5.6 06/23/2023 0900   RBC 4.03 (L) 06/23/2023 0900   HGB 12.4 (L) 06/23/2023 0900   HGB 11.4 (L) 06/03/2023 1019   HCT 37.1 (L) 06/23/2023 0900   HCT 35.8 (L) 06/03/2023 1019   PLT 216 06/23/2023 0900   PLT 230 06/03/2023 1019   MCV 92.1 06/23/2023 0900   MCV 91 06/03/2023 1019   MCH 30.8 06/23/2023 0900   MCHC 33.4 06/23/2023 0900   RDW 16.4 (H)  06/23/2023 0900   RDW 13.8 06/03/2023 1019   LYMPHSABS 568 (L) 03/31/2023 1200   LYMPHSABS 0.5 (L) 08/30/2020 1139   MONOABS 1.3 (H) 05/12/2022 0201   EOSABS 201 03/31/2023 1200   EOSABS 0.3 08/30/2020 1139   BASOSABS 49 03/31/2023 1200   BASOSABS 0.1 08/30/2020 1139    BMET    Component Value Date/Time   NA 142 06/23/2023 0900   NA 142 06/03/2023 1019   K 4.1 06/23/2023 0900   CL 112 (H) 06/23/2023 0900   CO2 20 (L) 06/23/2023 0900   GLUCOSE 92 06/23/2023 0900   BUN 20 06/23/2023 0900   BUN 20 06/03/2023 1019   CREATININE  1.35 (H) 06/23/2023 0900   CREATININE 1.36 (H) 03/31/2023 1200   CALCIUM 9.5 06/23/2023 0900   GFRNONAA 56 (L) 06/23/2023 0900   GFRAA 75 11/28/2020 0841    COAGS: Lab Results  Component Value Date   INR 1.0 06/23/2023   INR 1.3 (H) 05/11/2022     ASSESSMENT/PLAN: This is a 72 y.o. male with iatrogenic right ulnar artery pseudoaneurysm status postcardiac cath on 06/06/2023.  The pseudoaneurysm is increased in size.  He would benefit from operative repair, however he is currently on Eliquis.  My plan is to primarily repair the ulnar artery pseudoaneurysm in the operating room Monday once his Eliquis has washed out.  He will come in as an outpatient for elective surgery.  After discussing the risk and benefits, Merrel elected to proceed.  Fara Olden MD MS [facility] [phone] 06/23/2023  11:05 AM

## 2023-06-23 NOTE — Anesthesia Postprocedure Evaluation (Signed)
Anesthesia Post Note  Patient: CLAYBORNE DIVIS  Procedure(s) Performed: RIGHT ULNAR ARTERY PSEUDOANEURYSM REPAIR (Right: Arm Lower)     Patient location during evaluation: PACU Anesthesia Type: General Level of consciousness: awake and alert, patient cooperative and oriented Pain management: pain level controlled Vital Signs Assessment: post-procedure vital signs reviewed and stable Respiratory status: spontaneous breathing, respiratory function stable and nonlabored ventilation Cardiovascular status: blood pressure returned to baseline and stable Postop Assessment: no apparent nausea or vomiting Anesthetic complications: no   No notable events documented.  Last Vitals:  Vitals:   06/23/23 1315 06/23/23 1330  BP: (!) 170/71 (!) 172/69  Pulse: (!) 57 (!) 49  Resp: 13 11  Temp:    SpO2: 97% 97%    Last Pain:  Vitals:   06/23/23 1330  TempSrc:   PainSc: 6                  Katharine Rochefort,E. Anaid Haney

## 2023-06-23 NOTE — Op Note (Signed)
    NAME: Noah Grant    MRN: [id number] DOB: [birth year]    DATE OF OPERATION: 06/23/2023  PREOP DIAGNOSIS:    Right ulnar artery pseudoaneurysm  POSTOP DIAGNOSIS:    Same  PROCEDURE:    Right ulnar artery pseudoaneurysm repair  SURGEON: [name]  ASSIST: Mosetta Pigeon, PA  ANESTHESIA: General  EBL: 15 mL  INDICATIONS:    [name] is a 73 y.o. male  with iatrogenic right ulnar artery pseudoaneurysm status postcardiac cath on 06/06/2023.  The pseudoaneurysm is increased in size.  After discussing the risk and benefits of pseudoaneurysm repair, [name] elected to proceed.  FINDINGS:   Pinpoint arteriotomy appreciated off of the ulnar artery  TECHNIQUE:   Patient is brought to the OR laid in supine position.  Moderate anesthesia was induced and the patient was prepped and draped in standard fashion.  A block was placed preoperatively.  The case began with ultrasound insonation of the right wrist.  The pseudoaneurysm was appreciated.  This was marked.  A tourniquet was brought onto the field and placed on the upper arm and the arm exsanguinated using an Esmarch wrap.  A longitudinal incision was made with accompanying pain, therefore a significant mount of local anesthetic was administered.  He was still having some difficulty with pain, therefore we moved to general anesthesia.  Once intubated, I continue with the case and took the longitudinal incision down to the ulnar artery.  There was significant scarring and tissue planes were not readily appreciated due to the pseudoaneurysm.  The pseudoaneurysm was entered, and a significant mount of thrombus removed.  The tourniquet was taken down and a small arteriotomy was appreciated.  This was closed using 6-0 Prolene suture.  The nerve was appreciated in the base of the wound, special care was taken not to manipulate it.  Heparin was reversed with use of protamine.  Hemostasis achieved with use of cautery and  thrombin product.  The wound was closed using 3-0 Vicryl with staples at the level of the skin.  Ladonna Snide, MD [facility] DATE OF DICTATION:   06/23/2023

## 2023-06-23 NOTE — Anesthesia Procedure Notes (Signed)
Procedure Name: LMA Insertion Date/Time: 06/23/2023 12:32 PM  Performed by: Jairo Ben, MDPre-anesthesia Checklist: Patient identified, Patient being monitored, Timeout performed, Emergency Drugs available and Suction available Patient Re-evaluated:Patient Re-evaluated prior to induction Oxygen Delivery Method: Circle system utilized Preoxygenation: Pre-oxygenation with 100% oxygen LMA: LMA inserted LMA Size: 4.0 Number of attempts: 1 Placement Confirmation: positive ETCO2, breath sounds checked- equal and bilateral and CO2 detector Tube secured with: Tape Dental Injury: Teeth and Oropharynx as per pre-operative assessment

## 2023-06-24 ENCOUNTER — Other Ambulatory Visit: Payer: Self-pay | Admitting: Family Medicine

## 2023-06-24 ENCOUNTER — Encounter (HOSPITAL_COMMUNITY): Payer: Self-pay | Admitting: Vascular Surgery

## 2023-06-24 ENCOUNTER — Other Ambulatory Visit: Payer: Self-pay | Admitting: Cardiology

## 2023-06-24 DIAGNOSIS — I721 Aneurysm of artery of upper extremity: Secondary | ICD-10-CM | POA: Diagnosis not present

## 2023-06-24 DIAGNOSIS — I700 Atherosclerosis of aorta: Secondary | ICD-10-CM

## 2023-06-24 MED ORDER — APIXABAN 5 MG PO TABS: 5.0000 mg | ORAL_TABLET | Freq: Two times a day (BID) | ORAL | Status: DC

## 2023-06-24 MED ORDER — ASPIRIN 81 MG PO TBEC: 81.0000 mg | DELAYED_RELEASE_TABLET | Freq: Every day | ORAL | 12 refills | Status: AC

## 2023-06-24 MED ORDER — OXYCODONE-ACETAMINOPHEN 5-325 MG PO TABS: 1.0000 | ORAL_TABLET | Freq: Four times a day (QID) | ORAL | 0 refills | Status: DC | PRN

## 2023-06-24 NOTE — Progress Notes (Signed)
  Progress Note    06/24/2023 6:45 AM 1 Day Post-Op  Subjective:  wants to know if he had a catheter in the OR as he has some bloody urine this morning.  Wants to know about showering.    Afebrile HR 40's-70's NSR 150's-160's systolic 100% RA  Vitals:   06/23/23 2302 06/24/23 0316  BP: (!) 152/64 (!) 165/62  Pulse: 60 60  Resp: 18 13  Temp: 98 F (36.7 C) 98.6 F (37 C)  SpO2: 98% 100%    Physical Exam: General:  no distress Cardiac:  regular Lungs:  non labored Incisions:  ace wrap removed and bandage in tact and clean.  Extremities:  palpable right radial pulse; motor and sensory in tact.     CBC    Component Value Date/Time   WBC 5.8 06/23/2023 1506   RBC 3.33 (L) 06/23/2023 1506   HGB 9.9 (L) 06/23/2023 1506   HGB 11.4 (L) 06/03/2023 1019   HCT 30.0 (L) 06/23/2023 1506   HCT 35.8 (L) 06/03/2023 1019   PLT 200 06/23/2023 1506   PLT 230 06/03/2023 1019   MCV 90.1 06/23/2023 1506   MCV 91 06/03/2023 1019   MCH 29.7 06/23/2023 1506   MCHC 33.0 06/23/2023 1506   RDW 16.2 (H) 06/23/2023 1506   RDW 13.8 06/03/2023 1019   LYMPHSABS 568 (L) 03/31/2023 1200   LYMPHSABS 0.5 (L) 08/30/2020 1139   MONOABS 1.3 (H) 05/12/2022 0201   EOSABS 201 03/31/2023 1200   EOSABS 0.3 08/30/2020 1139   BASOSABS 49 03/31/2023 1200   BASOSABS 0.1 08/30/2020 1139    BMET    Component Value Date/Time   NA 142 06/23/2023 0900   NA 142 06/03/2023 1019   K 4.1 06/23/2023 0900   CL 112 (H) 06/23/2023 0900   CO2 20 (L) 06/23/2023 0900   GLUCOSE 92 06/23/2023 0900   BUN 20 06/23/2023 0900   BUN 20 06/03/2023 1019   CREATININE 1.26 (H) 06/23/2023 1506   CREATININE 1.36 (H) 03/31/2023 1200   CALCIUM 9.5 06/23/2023 0900   GFRNONAA >60 06/23/2023 1506   GFRAA 75 11/28/2020 0841    INR    Component Value Date/Time   INR 1.0 06/23/2023 0900     Intake/Output Summary (Last 24 hours) at 06/24/2023 0645 Last data filed at 06/24/2023 0600 Gross per 24 hour  Intake 740 ml   Output 305 ml  Net 435 ml      Assessment/Plan:  72 y.o. male is s/p:  Right ulnar artery psa repair  1 Day Post-Op   -pt's ace wrap removed and bandage is in tact and clean.  Ace wrap replaced and encouraged pt to elevate his arm.  Motor and sensory are in tact right hand.   -most likely discharge home today -Dr. Karin Lieu to determine when to restart Eliquis.     Doreatha Massed, PA-C [facility] [phone] 06/24/2023 6:45 AM

## 2023-06-24 NOTE — Progress Notes (Signed)
  Progress Note    06/24/2023 8:44 AM 1 Day Post-Op  Subjective:  wants to know if he had a catheter in the OR as he has some bloody urine this morning.  Wants to know about showering.    Afebrile HR 40's-70's NSR 150's-160's systolic 100% RA  Vitals:   06/24/23 0316 06/24/23 0820  BP: (!) 165/62 (!) 155/64  Pulse: 60 62  Resp: 13 16  Temp: 98.6 F (37 C) 98.2 F (36.8 C)  SpO2: 100% 100%    Physical Exam: General:  no distress Cardiac:  regular Lungs:  non labored Incisions:  ace wrap removed and bandage in tact and clean.  Extremities:  palpable right radial pulse; motor and sensory in tact.     CBC    Component Value Date/Time   WBC 5.8 06/23/2023 1506   RBC 3.33 (L) 06/23/2023 1506   HGB 9.9 (L) 06/23/2023 1506   HGB 11.4 (L) 06/03/2023 1019   HCT 30.0 (L) 06/23/2023 1506   HCT 35.8 (L) 06/03/2023 1019   PLT 200 06/23/2023 1506   PLT 230 06/03/2023 1019   MCV 90.1 06/23/2023 1506   MCV 91 06/03/2023 1019   MCH 29.7 06/23/2023 1506   MCHC 33.0 06/23/2023 1506   RDW 16.2 (H) 06/23/2023 1506   RDW 13.8 06/03/2023 1019   LYMPHSABS 568 (L) 03/31/2023 1200   LYMPHSABS 0.5 (L) 08/30/2020 1139   MONOABS 1.3 (H) 05/12/2022 0201   EOSABS 201 03/31/2023 1200   EOSABS 0.3 08/30/2020 1139   BASOSABS 49 03/31/2023 1200   BASOSABS 0.1 08/30/2020 1139    BMET    Component Value Date/Time   NA 142 06/23/2023 0900   NA 142 06/03/2023 1019   K 4.1 06/23/2023 0900   CL 112 (H) 06/23/2023 0900   CO2 20 (L) 06/23/2023 0900   GLUCOSE 92 06/23/2023 0900   BUN 20 06/23/2023 0900   BUN 20 06/03/2023 1019   CREATININE 1.26 (H) 06/23/2023 1506   CREATININE 1.36 (H) 03/31/2023 1200   CALCIUM 9.5 06/23/2023 0900   GFRNONAA >60 06/23/2023 1506   GFRAA 75 11/28/2020 0841    INR    Component Value Date/Time   INR 1.0 06/23/2023 0900     Intake/Output Summary (Last 24 hours) at 06/24/2023 0844 Last data filed at 06/24/2023 0600 Gross per 24 hour  Intake 740 ml   Output 305 ml  Net 435 ml      Assessment/Plan:  72 y.o. male is s/p:  Right ulnar artery psa repair  1 Day Post-Op   -pt's ace wrap removed and bandage is in tact and clean.  Ace wrap replaced and encouraged pt to elevate his arm.  Motor and sensory are in tact right hand.   -most likely discharge home today -Dr. Karin Lieu to determine when to restart Eliquis.     Doreatha Massed, PA-C [facility] [phone] 06/24/2023 8:44 AM  VASCULAR STAFF ADDENDUM: I have independently interviewed and examined the patient. I agree with the above.  Patient with therapy today, discharge home today Eliquis restart Thursday   Fara Olden, MD [facility] Phone Number: [phone] 06/24/2023 8:45 AM

## 2023-06-24 NOTE — Care Management CC44 (Signed)
Condition Code 44 Documentation Completed  Patient Details  Name: Noah Grant MRN: [id number] Date of Birth: 19-Sep-1951   Condition Code 44 given:  Yes Patient signature on Condition Code 44 notice:  Yes Documentation of 2 MD's agreement:  Yes Code 44 added to claim:  Yes    Darrold Span, RN 06/24/2023, 9:55 AM

## 2023-06-24 NOTE — Discharge Summary (Signed)
Discharge Summary    Noah Grant 1951/04/23 72 y.o. male  [id number]  Admission Date: 06/23/2023  Discharge Date: 06/24/2023  Physician: [name], MD  Admission Diagnosis: Pseudoaneurysm [facility]) [I72.9]   HPI:   This is a 72 y.o. male  status post cardiac cath on 06/06/2023 with iatrogenic right ulnar artery pseudoaneurysm.  He was recently seen at [facility] with interval growth, and vascular surgery was called for further recommendations.  I asked that he present to [facility] for evaluation.   On exam, Noah Grant was doing well.  He noted tenderness at the pseudoaneurysm site, but no unilateral numbness tingling or motor dysfunction.  Molly Maduro has rheumatoid arthritis at baseline, and has pain in bilateral hands. The aneurysm is grown from 2 cm to 3 cm.   Noah Grant is on Eliquis for intermittent A-fib  Hospital Course:  The patient was admitted to the hospital and taken to the operating room on 06/23/2023 and underwent: Right ulnar artery pseudoaneurysm repair     Findings:  Pinpoint arteriotomy appreciated off of the ulnar artery   The pt tolerated the procedure well and was transported to the PACU in good condition.   POD 1, pt was doing well.  His ace wrap was removed and dressing in tact.  His Eliquis will be held another couple of days.  He will take a baby asa for 2 days and then resume his Eliquis.     CBC    Component Value Date/Time   WBC 5.8 06/23/2023 1506   RBC 3.33 (L) 06/23/2023 1506   HGB 9.9 (L) 06/23/2023 1506   HGB 11.4 (L) 06/03/2023 1019   HCT 30.0 (L) 06/23/2023 1506   HCT 35.8 (L) 06/03/2023 1019   PLT 200 06/23/2023 1506   PLT 230 06/03/2023 1019   MCV 90.1 06/23/2023 1506   MCV 91 06/03/2023 1019   MCH 29.7 06/23/2023 1506   MCHC 33.0 06/23/2023 1506   RDW 16.2 (H) 06/23/2023 1506   RDW 13.8 06/03/2023 1019   LYMPHSABS 568 (L) 03/31/2023 1200   LYMPHSABS 0.5 (L) 08/30/2020 1139   MONOABS 1.3 (H) 05/12/2022 0201   EOSABS 201  03/31/2023 1200   EOSABS 0.3 08/30/2020 1139   BASOSABS 49 03/31/2023 1200   BASOSABS 0.1 08/30/2020 1139    BMET    Component Value Date/Time   NA 142 06/23/2023 0900   NA 142 06/03/2023 1019   K 4.1 06/23/2023 0900   CL 112 (H) 06/23/2023 0900   CO2 20 (L) 06/23/2023 0900   GLUCOSE 92 06/23/2023 0900   BUN 20 06/23/2023 0900   BUN 20 06/03/2023 1019   CREATININE 1.26 (H) 06/23/2023 1506   CREATININE 1.36 (H) 03/31/2023 1200   CALCIUM 9.5 06/23/2023 0900   GFRNONAA >60 06/23/2023 1506   GFRAA 75 11/28/2020 0841      Discharge Instructions     Call MD for:  redness, tenderness, or signs of infection (pain, swelling, bleeding, redness, odor or green/yellow discharge around incision site)   Complete by: As directed    Call MD for:  severe or increased pain, loss or decreased feeling  in affected limb(s)   Complete by: As directed    Call MD for:  temperature >100.5   Complete by: As directed    Discharge instructions   Complete by: As directed    Take baby aspirin for 2 days and then discontinue when you resume your Eliquis on 06/26/2023. Elevate right arm above heart a couple of times  per day to help with swelling.   Discharge wound care:   Complete by: As directed    Shower daily with soap and water starting 06/25/2023.  Okay for incision to get wet with soap and water and then pat dry.   Driving Restrictions   Complete by: As directed    No driving for 48 hours and while taking pain medication   Lifting restrictions   Complete by: As directed    No lifting until you return to our office for your post operative visit.   Resume previous diet   Complete by: As directed        Discharge Diagnosis:  Pseudoaneurysm (HCC) [I72.9]  Secondary Diagnosis: Patient Active Problem List   Diagnosis Date Noted   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   Hyperphosphatemia 10/30/2022   Lithium use 10/30/2022   Anemia 09/02/2022   Atrial fibrillation (HCC) 09/02/2022   Stasis  dermatitis of both legs 07/29/2022   Myocarditis (HCC) 05/14/2022   Pericarditis 05/12/2022   Atrial fibrillation with rapid ventricular response (HCC) 05/12/2022   Sepsis (HCC) 05/11/2022   Elevated troponin level not due myocardial infarction 05/11/2022   Lactic acidosis 05/11/2022   Drug therapy 05/18/2021   Hypertension    Normocytic anemia    Arthritis    Bipolar 1 disorder (HCC)    Chicken pox    Chronic bronchitis (HCC)    Depression    Hyperlipidemia    Rheumatic fever    Essential hypertension    Coronary artery disease involving native coronary artery of native heart 09/06/2020   Chest pain 09/06/2020   Coronary artery disease involving native coronary artery of native heart with angina pectoris (HCC) 09/06/2020   Angina pectoris (HCC) 08/30/2020   Cardiac murmur 08/30/2020   Statin intolerance 07/31/2020   Abdominal aortic atherosclerosis (HCC)    Ingrowing nail 03/12/2020   Bipolar affective disorder, currently depressed, moderate (HCC) 03/08/2020   Brain ischemia 03/08/2020   Cognitive complaints 03/08/2020   History of stroke 11/10/2019   Chronic kidney disease, stage 3a (HCC) 07/21/2019   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   Thoracic radiculopathy 06/16/2019   Intention tremor 06/16/2019   Right-sided chest wall pain 04/17/2019   Barrett's esophagus without dysplasia 03/25/2019   History of colon polyps 03/25/2019   PAD (peripheral artery disease) (HCC) 03/23/2019   Benign prostatic hyperplasia with nocturia 12/29/2018   BPH with urinary obstruction 12/29/2018   GAD (generalized anxiety disorder) 11/02/2018   Rheumatoid arthritis (HCC) 10/29/2018   Lichen planus 04/07/2018   High risk medication use 03/23/2018   Seasonal allergies 03/23/2018   Long-term use of immunosuppressant medication 03/23/2018   Low testosterone 09/17/2016   Vitamin B12 deficiency 09/17/2016   Vitamin D deficiency 09/17/2016   Disc degeneration, 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   Pain of  right hip joint 01/19/2016   Herpes gingivostomatitis 02/10/2015   Midline thoracic back pain 02/10/2015   Normochromic normocytic anemia 08/16/2014   Bipolar 1 disorder, mixed, moderate (HCC) 08/16/2014   Mixed hyperlipidemia 08/16/2014   Labile hypertension 08/16/2014   Gastroesophageal reflux disease 08/16/2014   Bipolar depression (HCC) 08/16/2014   GERD without esophagitis 08/16/2014   Bipolar 2 disorder (HCC) 08/16/2014   Past Medical History:  Diagnosis Date   Abdominal aortic atherosclerosis (HCC)    Anemia    likely of chronic disease   Angina pectoris (HCC) 08/30/2020   Arthritis    rheumatoid   Atrial fibrillation (HCC)    Atrial fibrillation with rapid ventricular response (HCC) 05/12/2022  06/20/23 - pt states he's not had any A-fib   Barrett's esophagus without dysplasia 03/25/2019   Formatting of this note might be different from the original. EGD done in 02/2019. Due in 3 years.   Benign prostatic hyperplasia with nocturia 12/29/2018   Bipolar 1 disorder (HCC)    Bipolar 1 disorder, mixed, moderate (HCC) 08/16/2014   Bipolar 2 disorder (HCC) 08/16/2014   Bipolar affective disorder, currently depressed, moderate (HCC) 03/08/2020   Bipolar depression (HCC) 08/16/2014   BPH with urinary obstruction 12/29/2018   Brain ischemia 03/08/2020   Cardiac murmur 08/30/2020   Chest pain 09/06/2020   Chicken pox    Chronic bronchitis (HCC)    Chronic kidney disease, stage 3a (HCC) 07/21/2019   Cognitive complaints 03/08/2020   Coronary artery disease involving native coronary artery of native heart 09/06/2020   Coronary artery disease involving native coronary artery of native heart with angina pectoris (HCC) 09/06/2020   Depression    Disc degeneration, 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 Last Assessment & Plan:  Well controlled. Continue current medications.   GERD without esophagitis 08/16/2014   Formatting of this note might be different from the original. Last Assessment & Plan:  Well controlled. Continue current medications.   Herpes gingivostomatitis 02/10/2015   High risk medication use 03/23/2018   History of colon polyps 03/25/2019   Formatting of this note might be different from the original. tubular adenoma   History of stroke 11/10/2019   Hyperlipidemia    Hyperphosphatemia 10/30/2022   Hypertension    Ingrowing nail 03/12/2020   Intention tremor 06/16/2019   Labile hypertension 08/16/2014   Formatting of this note might be different from the original. Last Assessment & Plan:  Slightly above goal today secondary to pain. Will continue current regimen. Will check CMP today.   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 Concern over possible lichen planus. His dentist noticed lesions of his oral mucosa.  It was felt to be lichen planus.  He presents to discuss further.  Rarely does the area hurt.He smoked in the distant past EXAM shows several white patches on the buccal mucosa bilaterally.  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 Rheum:  Dr.  Sharmon Revere Formerly on MTX - stopped due to mouth sores. Currently started on Arava.  Also on 5 mg prednisone x 3 months during run-in.   Right-sided chest wall pain 04/17/2019   Seasonal allergies 03/23/2018   Sepsis (HCC) 05/11/2022   Stasis dermatitis of both legs 07/29/2022   Statin intolerance 07/31/2020   Stroke (HCC)    3 - TIA's  Task-specific dystonia of hand    right hand   Thoracic radiculopathy 06/16/2019   Vitamin B12 deficiency    Injections   Vitamin D deficiency 09/17/2016     Allergies as of 06/24/2023       Reactions   Methotrexate Dermatitis   Developed Mouth Sores   Nsaids Dermatitis   Developed Mouth Blisters   Plaquenil [hydroxychloroquine] Other (See Comments)   Terrible Nightmares   Azulfidine [sulfasalazine] Nausea Only   Crestor [rosuvastatin] Other (See Comments)   Caused abdominal pain that radiated to the back   Iodinated Contrast Media    GI Intolerance, Made him severely sick   Isosorbide Other (See Comments)   Made him feel weak   Mevacor [lovastatin] Other (See Comments)   Caused abdominal pain that radiated to the back   Pravachol [pravastatin] Other (See Comments)   Caused abdominal pain that radiated to the back   Ranexa [ranolazine Er] Nausea Only   Severe nausea        Medication List     TAKE these medications    acetaminophen 650 MG CR tablet Commonly known as: TYLENOL Take 1,300 mg by mouth every 8 (eight) hours as needed for pain.   amLODipine 5 MG tablet Commonly known as: NORVASC Take 2 tablets (10 mg total) by mouth daily.   apixaban 5 MG Tabs tablet Commonly known as: Eliquis Take 1 tablet (5 mg total) by mouth 2 (two) times daily. Start taking on: June 26, 2023 What changed: These instructions start on June 26, 2023. If you are unsure what to do until then, ask your doctor or other care provider.   aspirin EC 81 MG tablet Take 1 tablet (81 mg total) by mouth daily. Swallow whole.   Cholecalciferol 50 MCG  (2000 UT) Caps Take 2,000 Units by mouth daily.   CoQ10 200 MG Caps Take 200 mg by mouth daily.   cyanocobalamin 1000 MCG tablet Commonly known as: VITAMIN B12 Take 1,000 mcg by mouth daily.   ezetimibe 10 MG tablet Commonly known as: ZETIA Take 1 tablet (10 mg total) by mouth daily.   finasteride 5 MG tablet Commonly known as: PROSCAR TAKE 1 TABLET BY MOUTH DAILY   hydrALAZINE 50 MG tablet Commonly known as: APRESOLINE Take 50 mg by mouth 3 (three) times daily.   leflunomide 20 MG tablet Commonly known as: ARAVA TAKE 1 TABLET BY MOUTH DAILY   lithium carbonate 150 MG capsule Take 3 capsules (450 mg total) by mouth daily.   losartan 100 MG tablet Commonly known as: COZAAR Take 100 mg by mouth daily.   Lysine HCl 1000 MG Tabs Take 1,000 mg by mouth 2 (two) times daily.   nitroGLYCERIN 0.4 MG SL tablet Commonly known as: NITROSTAT Place 1 tablet (0.4 mg total) under the tongue every 5 (five) minutes as needed for chest pain.   omeprazole 40 MG capsule Commonly known as: PRILOSEC Take 1 capsule (40 mg total) by mouth in the morning and at bedtime.   oxyCODONE-acetaminophen 5-325 MG tablet Commonly known as: PERCOCET/ROXICET Take 1 tablet by mouth every 6 (six) hours as needed for moderate pain.   simvastatin 5 MG tablet Commonly known as: ZOCOR TAKE 1 TABLET BY MOUTH DAILY   triamcinolone cream 0.1 % Commonly known as: KENALOG Apply 1 Application topically 2 (two) times daily.   Voltaren 1 % Gel Generic drug: diclofenac Sodium Apply 1 application  topically daily as needed for pain.  Discharge Care Instructions  (From admission, onward)           Start     Ordered   06/24/23 0000  Discharge wound care:       Comments: Shower daily with soap and water starting 06/25/2023.  Okay for incision to get wet with soap and water and then pat dry.   06/24/23 0742            Prescriptions given: Roxicet #10 No  Refill  Instructions: 1.  No driving for 48 hours and while taking pain medication 2.  No heavy lifting until returning to our office post op 3.  Shower daily starting 06/25/2023 4.  Elevate arm a couple times a day above heart to help with swelling  Disposition: home  Patient's condition: is Good  Follow up: 1. [facility]  in 2 weeks for staple removal.    Doreatha Massed, PA-C [facility] [phone] 06/24/2023  7:43 AM

## 2023-06-24 NOTE — Plan of Care (Signed)

## 2023-06-24 NOTE — Telephone Encounter (Signed)
 RX sent

## 2023-06-24 NOTE — Care Management Obs Status (Signed)
MEDICARE OBSERVATION STATUS NOTIFICATION   Patient Details  Name: Noah Grant MRN: [id number] Date of Birth: Feb 27, 1951   Medicare Observation Status Notification Given:  Yes    Darrold Span, RN 06/24/2023, 9:55 AM

## 2023-06-24 NOTE — Evaluation (Signed)
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  S/P repair 7/15. PMH includes: TIA, no diagnosed hx of seizures, anxiety, bipolar disorder, CKD, CAD, HTN, and RA.   Clinical Impression   Pt admitted for above and presents with problem list below. PTA independent, using cane for mobility. Currently completing ADLs with at most supervision, cueing to utilize R UE during functional tasks.  Worked on exercises as below, and educated on edema reduction including elevation, exercises and ice.  Pt eager to dc home, has good support from spouse.  No further OT needs identified and OT will sign off.      Recommendations for follow up therapy are one component of a multi-disciplinary discharge planning process, led by the attending physician.  Recommendations may be updated based on patient status, additional functional criteria and insurance authorization.   Assistance Recommended at Discharge PRN  Patient can return home with the following A little help with bathing/dressing/bathroom;Assistance with cooking/housework;Assist for transportation    Functional Status Assessment  Patient has had a recent decline in their functional status and demonstrates the ability to make significant improvements in function in a reasonable and predictable amount of time.  Equipment Recommendations  None recommended by OT    Recommendations for Other Services       Precautions / Restrictions Precautions Precautions: Fall Precaution Comments: R UE no ROM restrictions, 5# lifting restriction Restrictions Weight Bearing Restrictions: No      Mobility Bed Mobility Overal bed mobility: Independent                  Transfers Overall transfer level: Modified independent                         Balance Overall balance assessment: Mild deficits observed, not formally tested                                         ADL either performed or assessed with clinical judgement   ADL Overall ADL's : Needs assistance/impaired     Grooming: Standing;Modified independent           Upper Body Dressing : Modified independent;Sitting   Lower Body Dressing: Sit to/from stand;Supervision/safety Lower Body Dressing Details (indicate cue type and reason): cueing to use R UE functionally to don socks, Toilet Transfer: Modified Independent;Ambulation   Toileting- Clothing Manipulation and Hygiene: Modified independent;Sit to/from stand       Functional mobility during ADLs: Supervision/safety General ADL Comments: for safety, pt typically using cane in R hand (but has been using in L for the last few weeks).  Reaching for UE support when not using cane     Vision   Vision Assessment?: No apparent visual deficits     Perception     Praxis      Pertinent Vitals/Pain Pain Assessment Pain Assessment: Faces Faces Pain Scale: Hurts a little bit Pain Location: R UE Pain Descriptors / Indicators: Operative site guarding Pain Intervention(s): Limited activity within patient's tolerance, Monitored during session, Repositioned     Hand Dominance Right   Extremity/Trunk Assessment Upper Extremity Assessment Upper Extremity Assessment: RUE deficits/detail RUE Deficits / Details: s/p pseduoaneruysm repair, hx of RA with joint  stiffness and approx 90-95% of ROM baseline.  Pt reports sensation normal, with incrased edema and decreased ROM but able to make a fist, limited wrist extension; WFL shoulder /elbow. RUE Sensation: WNL RUE Coordination: decreased fine motor   Lower Extremity Assessment Lower Extremity Assessment: Defer to PT evaluation   Cervical / Trunk Assessment Cervical / Trunk Assessment: Normal   Communication Communication Communication: No  difficulties   Cognition Arousal/Alertness: Awake/alert Behavior During Therapy: WFL for tasks assessed/performed Overall Cognitive Status: Within Functional Limits for tasks assessed                                       General Comments  educated on exercises to RUE, importance of elevation, exercises and ice if needed    Exercises Exercises: General Upper Extremity, Hand exercises General Exercises - Upper Extremity Shoulder Flexion: AROM, 5 reps, Right, Supine Elbow Flexion: AROM, Right, 5 reps, Supine Elbow Extension: AROM, Right, 5 reps, Supine Wrist Flexion: AAROM, Right, 10 reps, Supine (limited to tolerance) Wrist Extension: AAROM, Right, 10 reps, Supine (to tolerance) Digit Composite Flexion: AROM, Both, 10 reps, Supine (provide squeeze block (pink)) Composite Extension: AROM, Right, 10 reps, Supine Hand Exercises Forearm Supination: AROM, AAROM, Right, 10 reps, Supine   Shoulder Instructions      Home Living Family/patient expects to be discharged to:: Private residence Living Arrangements: Spouse/significant other Available Help at Discharge: Family;Available 24 hours/day Type of Home: Other(Comment) (townhouse) Home Access: Stairs to enter [employer] of Steps: 1   Home Layout: One level     Bathroom Shower/Tub: Chief Strategy Officer: Handicapped height     Home Equipment: Cane - single point;Grab bars - tub/shower          Prior Functioning/Environment Prior Level of Function : Independent/Modified Independent                        OT Problem List: Decreased strength;Decreased range of motion      OT Treatment/Interventions:      OT Goals(Current goals can be found in the care plan section) Acute Rehab OT Goals Patient Stated Goal: home OT Goal Formulation: With patient  OT Frequency:      Co-evaluation              AM-PAC OT "6 Clicks" Daily Activity     Outcome Measure Help from  another person eating meals?: A Little Help from another person taking care of personal grooming?: A Little Help from another person toileting, which includes using toliet, bedpan, or urinal?: None Help from another person bathing (including washing, rinsing, drying)?: None Help from another person to put on and taking off regular upper body clothing?: None Help from another person to put on and taking off regular lower body clothing?: A Little 6 Click Score: 21   End of Session Nurse Communication: Mobility status  Activity Tolerance: Patient tolerated treatment well Patient left: in bed;with call bell/phone within reach  OT Visit Diagnosis: Muscle weakness (generalized) (M62.81)                Time: 0981-1914 OT Time Calculation (min): 17 min Charges:  OT General Charges $OT Visit: 1 Visit OT Evaluation $OT Eval Low Complexity: 1 Low  Barry Brunner, OT Acute Rehabilitation Services Office [phone]'   Chancy Milroy 06/24/2023, 11:20 AM

## 2023-06-25 ENCOUNTER — Telehealth: Payer: Self-pay | Admitting: Physician Assistant

## 2023-06-25 NOTE — Telephone Encounter (Signed)
-----   Message from [facility] sent at 06/24/2023  7:41 AM EDT ----- S/p right ulnar artery repair on 7/15.  F/u in 2 weeks on Dr. Karin Lieu clinic day for staple removal.  No studies.  Thanks

## 2023-06-25 NOTE — Telephone Encounter (Signed)
 Appt has been scheduled.

## 2023-06-26 NOTE — Progress Notes (Unsigned)
Office Visit Note  Patient: Noah Grant             Date of Birth: [birth year]           MRN: [id number]             PCP: Sharlene Dory, DO Referring: Sharlene Dory* Visit Date: 06/30/2023   Subjective:  No chief complaint on file.   History of Present Illness: Noah Grant is a 72 y.o. 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 daily    Previous HPI 03/31/2023 Noah Grant is a 72 y.o. 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 daily we switch medications after last visit.  So far has not seen any significant difference in joint symptoms for better or worse.  He is tolerating the medication without noticeable side effect.  He had follow-up with his PCP office and discussed bilateral foot pain that was apparently unclear if related to peripheral neuropathy or from his rheumatoid arthritis.  Pain is mostly in the anterior half of the foot more severe along the top and across MTPs particularly bad early in the day when weightbearing or with direct pressure.  His mobility is also limited with bilateral hip pain.  Hurts worse on the side of the hips gets much worse after prolonged walking also gets pain if lying on either side in bed from direct pressure.   Previous HPI 01/29/23 Noah Grant is a 72 y.o. male here for follow up for seropositive RA on Actemra 162 mg subcu q. 14 days.  Overall he is feels very poorly like his arthritis is not benefiting at all from the treatment and mobility is worse than a few months ago.  He stopped some cardiac medications with improvement in his dizziness and leg swelling and fatigue.  Recent 2D echo look normal with no evidence of effusion or reduced ejection fraction from previous myocarditis episode.  He has ongoing bilateral shoulder pain with stiffness that remains throughout the entire day.  He is using a cane for offloading due to pain affecting hips and feet on both  sides worse while walking.  He is seeing some diffuse swelling throughout the hands this appears worse at the end of the day does not localize to any particular joints.  Does cause difficulty fully closing his grip.   Previous HPI 10/29/22 Noah Grant is a 72 y.o. 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  He completed the colchicine and is now on just the Actemra treatment.  He is feeling very fatigued limiting his overall activity level.  He is also been experiencing some additional pain and stiffness in multiple areas.  He had increased swelling in both legs accumulating towards the feet and ankles.  He has had adjustment to beta blocker treatment currently prescribed metoprolol XR 25 mg daily. Bradycardia and heart rate improved now often in the 50s did not notice any difference in the peripheral swelling after the medicine change. He is also still on norvasc and hydralazine for blood pressure control.   Previous HPI 05/30/22 Noah Grant is a 72 y.o. male here for follow up for RA he was on leflunomide recently hospitalized with sepsis and pericarditis without clear underlying infection identified.  Symptoms started with chills developed increasing swelling in his hands and feet with joint pain that was worse in the  shoulders and hips.  He developed severe chest pain felt a constriction or tightness around the heart.  The chest pain was sharp did report provocation with deep breaths or lying in left or right positions.  Evaluation was consistent with pericarditis with effusion.  He was discharged on colchicine and received short term steroids and the chest pain is currently doing well.  The flareup of joint pain has also improved with the oral anti-inflammatory medication.   Previous HPI 04/19/22 Noah Grant is a 72 y.o. male here for rheumatoid arthritis previously seeing Dr. Sharmon Revere and on treatment with leflunomide.  Symptoms  started a long time ago gradual onset but has been seeing trucks for treatment of this in the past decade or so.  He has joint pain and stiffness involving multiple areas.  Shoulders, wrists, hands, also bilateral hip pains.  Most commonly sees visible swelling or inflammation in his hands.  He has morning stiffness very severe for 30 minutes to an hour but keeps persistent joint stiffness throughout the day.  Also has pain increased when trying to lie still at night.  He has tried a number of treatments for this over multiple years.  He did not tolerate hydroxychloroquine due to confusion, MTX not tolerated, leflunomide leukopenia, and sulfasalazine mood disturbances.  He never experienced a significant improvement with Humira injection and developed acute conjunctivitis shortly after starting Remicade infusion.   DMARD Hx LEF current MTX GI intolerance HCQ Psychiatric side effects SSZ Psychiatric side effects Humira nonresponder Remicade nonresponder and conjunctivitis   12/2021 Cholesterol 107 TGs 193 HDL 28.9   09/2021 HBV neg HCV neg   No Rheumatology ROS completed.   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   Hyperphosphatemia 10/30/2022   Lithium use 10/30/2022   Anemia 09/02/2022   Atrial fibrillation (HCC) 09/02/2022   Stasis dermatitis of both legs 07/29/2022   Myocarditis (HCC) 05/14/2022   Pericarditis 05/12/2022   Atrial fibrillation with rapid ventricular response (HCC) 05/12/2022   Sepsis (HCC) 05/11/2022   Elevated troponin level not due myocardial infarction 05/11/2022   Lactic acidosis 05/11/2022   Drug therapy 05/18/2021   Hypertension    Normocytic anemia    Arthritis    Bipolar 1 disorder (HCC)    Chicken pox    Chronic bronchitis (HCC)    Depression    Hyperlipidemia    Rheumatic fever    Essential hypertension    Coronary artery disease involving native coronary artery of native heart  09/06/2020   Chest pain 09/06/2020   Coronary artery disease involving native coronary artery of native heart with angina pectoris (HCC) 09/06/2020   Angina pectoris (HCC) 08/30/2020   Cardiac murmur 08/30/2020   Statin intolerance 07/31/2020   Abdominal aortic atherosclerosis (HCC)    Ingrowing nail 03/12/2020   Bipolar affective disorder, currently depressed, moderate (HCC) 03/08/2020   Brain ischemia 03/08/2020   Cognitive complaints 03/08/2020   History of stroke 11/10/2019   Chronic kidney disease, stage 3a (HCC) 07/21/2019   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   Thoracic radiculopathy 06/16/2019   Intention tremor 06/16/2019   Right-sided chest wall pain 04/17/2019   Barrett's esophagus without dysplasia 03/25/2019   History of colon polyps 03/25/2019   PAD (peripheral artery disease) (HCC) 03/23/2019   Benign prostatic hyperplasia with nocturia 12/29/2018   BPH with urinary obstruction 12/29/2018   GAD (generalized anxiety disorder) 11/02/2018   Rheumatoid arthritis (HCC) 10/29/2018   Lichen  planus 04/07/2018   High risk medication use 03/23/2018   Seasonal allergies 03/23/2018   Long-term use of immunosuppressant medication 03/23/2018   Low testosterone 09/17/2016   Vitamin B12 deficiency 09/17/2016   Vitamin D deficiency 09/17/2016   Disc degeneration, 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   Pain of right hip joint 01/19/2016   Herpes gingivostomatitis 02/10/2015   Midline thoracic back pain 02/10/2015   Normochromic normocytic anemia 08/16/2014   Bipolar 1 disorder, mixed, moderate (HCC) 08/16/2014   Mixed hyperlipidemia 08/16/2014   Labile hypertension 08/16/2014   Gastroesophageal reflux disease 08/16/2014   Bipolar depression (HCC) 08/16/2014   GERD without esophagitis 08/16/2014   Bipolar 2 disorder (HCC) 08/16/2014    Past Medical History:  Diagnosis Date   Abdominal aortic atherosclerosis (HCC)    Anemia    likely of chronic disease   Angina pectoris (HCC)  08/30/2020   Arthritis    rheumatoid   Atrial fibrillation (HCC)    Atrial fibrillation with rapid ventricular response (HCC) 05/12/2022   06/20/23 - pt states he's not had any A-fib   Barrett's esophagus without dysplasia 03/25/2019   Formatting of this note might be different from the original. EGD done in 02/2019. Due in 3 years.   Benign prostatic hyperplasia with nocturia 12/29/2018   Bipolar 1 disorder (HCC)    Bipolar 1 disorder, mixed, moderate (HCC) 08/16/2014   Bipolar 2 disorder (HCC) 08/16/2014   Bipolar affective disorder, currently depressed, moderate (HCC) 03/08/2020   Bipolar depression (HCC) 08/16/2014   BPH with urinary obstruction 12/29/2018   Brain ischemia 03/08/2020   Cardiac murmur 08/30/2020   Chest pain 09/06/2020   Chicken pox    Chronic bronchitis (HCC)    Chronic kidney disease, stage 3a (HCC) 07/21/2019   Cognitive complaints 03/08/2020   Coronary artery disease involving native coronary artery of native heart 09/06/2020   Coronary artery disease involving native coronary artery of native heart with angina pectoris (HCC) 09/06/2020   Depression    Disc degeneration, 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 Last Assessment & Plan:  Well controlled. Continue current medications.   GERD without esophagitis 08/16/2014   Formatting of this note might be different from the original. Last Assessment & Plan:  Well controlled. Continue current medications.   Herpes gingivostomatitis 02/10/2015   High risk medication use 03/23/2018   History of colon polyps 03/25/2019   Formatting of this note might be different from the original. tubular adenoma   History of stroke 11/10/2019   Hyperlipidemia     Hyperphosphatemia 10/30/2022   Hypertension    Ingrowing nail 03/12/2020   Intention tremor 06/16/2019   Labile hypertension 08/16/2014   Formatting of this note might be different from the original. Last Assessment & Plan:  Slightly above goal today secondary to pain. Will continue current regimen. Will check CMP today.   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 Concern over possible lichen planus. His dentist noticed lesions of his oral mucosa.  It was felt to be lichen planus.  He presents to discuss further.  Rarely does the area hurt.He smoked in the distant past EXAM shows several white patches on the buccal mucosa bilaterally.  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 Rheum:  Dr. Sharmon Revere Formerly on MTX - stopped due to mouth sores. Currently started on Arava.  Also on 5 mg prednisone x 3 months during run-in.   Right-sided chest wall pain 04/17/2019   Seasonal allergies 03/23/2018   Sepsis (HCC) 05/11/2022   Stasis dermatitis of both legs 07/29/2022   Statin intolerance 07/31/2020   Stroke (HCC)    3 - TIA's   Task-specific dystonia of hand    right hand   Thoracic radiculopathy 06/16/2019   Vitamin B12 deficiency    Injections   Vitamin D deficiency 09/17/2016    Family History  Problem Relation Age of Onset   Alzheimer's disease  Mother 56       Deceased in early 46s   Stroke Father 51       Deceased   Hypertension Father    Alcoholism Father    Alcoholism Brother    Heart disease Maternal Grandfather 2       Deceased   Heart disease Paternal Grandfather 21       Deceased   Hypertension Son    Past Surgical History:  Procedure Laterality Date   CLAVICLE SURGERY     Right, Hardware placed   CORONARY STENT INTERVENTION N/A 09/06/2020   Procedure: CORONARY STENT INTERVENTION;  Surgeon: Tonny Bollman, MD;  Location: [facility] INVASIVE CV LAB;  Service: Cardiovascular;  Laterality: N/A;   FALSE ANEURYSM REPAIR Right 06/23/2023   Procedure: RIGHT ULNAR ARTERY PSEUDOANEURYSM REPAIR;  Surgeon: [name], MD;  Location: [facility] OR;  Service: Vascular;  Laterality: Right;   ingrown toenail Bilateral    great toes   LEFT HEART CATH AND CORONARY ANGIOGRAPHY N/A 09/06/2020   Procedure: LEFT HEART CATH AND CORONARY ANGIOGRAPHY;  Surgeon: Tonny Bollman, MD;  Location: [facility] INVASIVE CV LAB;  Service: Cardiovascular;  Laterality: N/A;   LEFT HEART CATH AND CORONARY ANGIOGRAPHY N/A 06/06/2023   Procedure: LEFT HEART CATH AND CORONARY ANGIOGRAPHY;  Surgeon: Marykay Lex, MD;  Location: [facility] INVASIVE CV LAB;  Service: Cardiovascular;  Laterality: N/A;   WISDOM TOOTH EXTRACTION     Social History   Social History Narrative   Not on file   Immunization History  Administered Date(s) Administered   Fluad Quad(high Dose 65+) 09/03/2021, 09/04/2022   Influenza, High Dose Seasonal PF 09/10/2019, 09/03/2020   Influenza,inj,Quad PF,6+ Mos 08/22/2014, 10/04/2015   Moderna SARS-COV2 Booster Vaccination 10/31/2020, 07/04/2021   Moderna Sars-Covid-2 Vaccination 01/17/2020, 02/13/2020   PNEUMOCOCCAL CONJUGATE-20 01/02/2022   Zoster Recombinant(Shingrix) 07/02/2017, 09/02/2017   Zoster, Live 08/17/2011     Objective: Vital Signs: There were no vitals taken for this visit.   Physical Exam   Musculoskeletal Exam: ***  CDAI  Exam: CDAI Score: -- Patient Global: --; Provider Global: -- Swollen: --; Tender: -- Joint Exam 06/30/2023   No joint exam has been documented for this visit   There is currently no information documented on  the homunculus. Go to the Rheumatology activity and complete the homunculus joint exam.  Investigation: No additional findings.  Imaging: VAS [place] UPPER EXTREMITY ARTERIAL DUPLEX  Result Date: 06/20/2023  UPPER EXTREMITY DUPLEX STUDY Patient Name:  Noah Grant  Date of Exam:   06/20/2023 Medical Rec #: [id number]        Accession #:    [id number] Date of Birth: 1951-10-14       Patient Gender: M Patient Age:   65 years Exam Location:  [facility] Procedure:      VAS [place] UPPER EXTREMITY ARTERIAL DUPLEX Referring Phys: DAVID HARDING --------------------------------------------------------------------------------  Indications: Wrist pain, bruising and palpable knot. History:     Patient has a history of catheterization via right radial artery.  Risk Factors:  Hypertension, hyperlipidemia, past history of smoking, coronary                artery disease. Other Factors: NOTE: Patient had a dose of Eliquis 5 mg this morning. Comparison Study: Upper arterial Duplex done 06/13/23 at [facility] showed a                   pseudoaneurysm coming off the right ulnar artery measuring 2.2                   cm x 0.9 cm x 1.3 cm. Patient had cardiac catheterization on                   06/06/23. Performing Technologist: Jake Seats RDMS, RVT, RDCS  Examination Guidelines: A complete evaluation includes B-mode imaging, spectral Doppler, color Doppler, and power Doppler as needed of all accessible portions of each vessel. Bilateral testing is considered an integral part of a complete examination. Limited examinations for reoccurring indications may be performed as noted.  Right Doppler Findings: +----------+----------+---------+--------+--------+ Site      PSV (cm/s)Waveform StenosisComments  +----------+----------+---------+--------+--------+ Ulnar Mid 130       biphasic                  +----------+----------+---------+--------+--------+ Ulnar Dist28        triphasic                 +----------+----------+---------+--------+--------+ Hypoechoic pulsitile mass seen in the right wrist area coming off the distal right ulnar artery measuring 3.1 cm x 1.42cm x 1.3 cm. Right radial artery is patent.  Findings reported to Azalee Course, [place] at 9:00AM. Summary:  Right: Obstruction noted in the ulnar artery. Pseudoaneurysm seen        coming off the right distal ulnar artery measuring 3.1cm x        1.4cm x 1.3 cm. Pseudoaneurysm neck measured 0.15 cm.         Patient is instructed to be NPO, and go to [facility] ER to        be assessed by Vascular Surgery. *See table(s) above for measurements and observations. Suggest Peripheral Vascular Consult. Electronically signed by Charlton Haws MD on 06/20/2023 at 1:48:26 PM.    Final    [place] UPPER EXTREMITY ARTERIAL RIGHT LIMITED (GRAFT, SINGLE VESSEL)  Result Date: 06/13/2023 CLINICAL DATA:  Wrist pain status post recent cardiac catheterization EXAM: RIGHT UPPER EXTREMITY ARTERIAL DUPLEX SCAN TECHNIQUE: Gray-scale sonography as well as color Doppler and duplex ultrasound was performed to evaluate the arteries of the right wrist. COMPARISON:  None available FINDINGS: Targeted sonographic evaluation of the area of concern in the medial right wrist at the site of swelling demonstrates  a 2.2 x 0.9 x 1.3 cm pseudoaneurysm. The pseudoaneurysm is communicating with the ulnar artery. IMPRESSION: 2.2 x 0.9 x 1.3 cm pseudoaneurysm originating from the ulnar artery corresponds to the site of wrist pain. Electronically Signed   By: Acquanetta Belling M.D.   On: 06/13/2023 11:52   CARDIAC CATHETERIZATION  Result Date: 06/06/2023   RPDA-1 overlapping stents are widely patent & RPDA-2 lesion is 60% stenosed (seen on previous post-PCI images)   Minimal to mild LCA CAD    -------------------- HEMODYNAMICS ----------------------   LV end diastolic pressure is normal.   There is no aortic valve stenosis.   -------------------RECOMMENDATIONS-----------------------   Recommend to resume Apixaban, at currently prescribed dose and frequency on 06/06/2023.   Recommend concurrent antiplatelet therapy of Aspirin 81 mg daily. Stable findings on angiography with widely patent PDA stent. Patient has LVH on echo, could consider microvascular disease.  I opted to forego CFR measurement to avoid extra contrast. RECOMMENDATIONS Discharge home after bedrest to follow-up with Dr. Tomie China Have added Ranexa 500 mg twice daily He is also no longer on beta-blocker due to bradycardia and fatigue, could consider possibly using a less potent rate control beta-blocker such as Bystolic or carvedilol for antianginal benefit. Patient is complaining of feeling extremely cold and weak as result of side effects from Eliquis. Recommend that he discuss with his primary cardiologist. He says he has not had A-fib since his myocarditis. Was asking about going back on aspirin and Plavix and coming off of DOAC. Will defer to primary cardiologist. Bryan Lemma, MD   Recent Labs: Lab Results  Component Value Date   WBC 5.8 06/23/2023   HGB 9.9 (L) 06/23/2023   PLT 200 06/23/2023   NA 142 06/23/2023   K 4.1 06/23/2023   CL 112 (H) 06/23/2023   CO2 20 (L) 06/23/2023   GLUCOSE 92 06/23/2023   BUN 20 06/23/2023   CREATININE 1.26 (H) 06/23/2023   BILITOT 0.7 06/23/2023   ALKPHOS 60 06/23/2023   AST 39 06/23/2023   ALT 52 (H) 06/23/2023   PROT 7.1 06/23/2023   ALBUMIN 4.5 06/23/2023   CALCIUM 9.5 06/23/2023   GFRAA 75 11/28/2020   QFTBGOLDPLUS NEGATIVE 04/19/2022    Speciality Comments: No specialty comments available.  Procedures:  No procedures performed Allergies: Methotrexate, Nsaids, Plaquenil [hydroxychloroquine], Azulfidine [sulfasalazine], Crestor [rosuvastatin], Iodinated contrast media,  Isosorbide, Mevacor [lovastatin], Pravachol [pravastatin], and Ranexa [ranolazine er]   Assessment / Plan:     Visit Diagnoses: No diagnosis found.  ***  Orders: No orders of the defined types were placed in this encounter.  No orders of the defined types were placed in this encounter.    Follow-Up Instructions: No follow-ups on file.   Metta Clines, RT  Note - This record has been created using [employer].  Chart creation errors have been sought, but may not always  have been located. Such creation errors do not reflect on  the standard of medical care.

## 2023-06-30 ENCOUNTER — Encounter: Payer: Self-pay | Admitting: Internal Medicine

## 2023-06-30 ENCOUNTER — Ambulatory Visit: Payer: PPO | Attending: Internal Medicine | Admitting: Internal Medicine

## 2023-06-30 VITALS — BP 161/68 | HR 52 | Resp 14 | Ht 67.0 in | Wt 160.0 lb

## 2023-06-30 DIAGNOSIS — I309 Acute pericarditis, unspecified: Secondary | ICD-10-CM | POA: Diagnosis not present

## 2023-06-30 DIAGNOSIS — M069 Rheumatoid arthritis, unspecified: Secondary | ICD-10-CM | POA: Diagnosis not present

## 2023-06-30 DIAGNOSIS — Z79899 Other long term (current) drug therapy: Secondary | ICD-10-CM | POA: Diagnosis not present

## 2023-06-30 LAB — SEDIMENTATION RATE: Sed Rate: 2 mm/h (ref 0–20)

## 2023-06-30 MED ORDER — PREDNISONE 5 MG PO TABS
5.0000 mg | ORAL_TABLET | Freq: Every day | ORAL | 2 refills | Status: DC | PRN
Start: 2023-06-30 — End: 2023-12-31

## 2023-07-01 LAB — COMPLETE METABOLIC PANEL WITH GFR
AG Ratio: 2.2 (calc) (ref 1.0–2.5)
ALT: 16 U/L (ref 9–46)
AST: 15 U/L (ref 10–35)
Albumin: 4.4 g/dL (ref 3.6–5.1)
Alkaline phosphatase (APISO): 63 U/L (ref 35–144)
BUN/Creatinine Ratio: 16 (calc) (ref 6–22)
BUN: 21 mg/dL (ref 7–25)
CO2: 24 mmol/L (ref 20–32)
Calcium: 9.5 mg/dL (ref 8.6–10.3)
Chloride: 109 mmol/L (ref 98–110)
Creat: 1.29 mg/dL — ABNORMAL HIGH (ref 0.70–1.28)
Globulin: 2 g/dL (calc) (ref 1.9–3.7)
Glucose, Bld: 84 mg/dL (ref 65–99)
Potassium: 4.4 mmol/L (ref 3.5–5.3)
Sodium: 140 mmol/L (ref 135–146)
Total Bilirubin: 0.4 mg/dL (ref 0.2–1.2)
Total Protein: 6.4 g/dL (ref 6.1–8.1)
eGFR: 59 mL/min/{1.73_m2} — ABNORMAL LOW (ref >=60)

## 2023-07-01 LAB — C-REACTIVE PROTEIN: CRP: <3 mg/L (ref <8.0)

## 2023-07-01 NOTE — Progress Notes (Signed)
Sed rate and CRP remain normal. Liver function is normal. Kidney function is stable with estimated GFR of 59.

## 2023-07-07 ENCOUNTER — Telehealth: Payer: Self-pay

## 2023-07-07 NOTE — Telephone Encounter (Signed)
Pt called c/o itching and aching at his staples on his wrist. He was requesting advice or an earlier appt to remove them.  Reviewed pt's chart, returned call for clarification, no answer, lf detailed vm per DPR. Instructed him to use an ice pack or cold compress, take Benadryl, and use moisturizing lotion on the surrounding skin, but not at the actual staple sites.

## 2023-07-08 ENCOUNTER — Other Ambulatory Visit: Payer: Self-pay | Admitting: Family Medicine

## 2023-07-08 DIAGNOSIS — R351 Nocturia: Secondary | ICD-10-CM

## 2023-07-11 ENCOUNTER — Ambulatory Visit (INDEPENDENT_AMBULATORY_CARE_PROVIDER_SITE_OTHER): Payer: PPO | Admitting: Physician Assistant

## 2023-07-11 VITALS — BP 185/71 | HR 67 | Temp 97.8°F | Resp 18 | Ht 67.0 in | Wt 162.6 lb

## 2023-07-11 DIAGNOSIS — T81718A Complication of other artery following a procedure, not elsewhere classified, initial encounter: Secondary | ICD-10-CM

## 2023-07-11 DIAGNOSIS — I729 Aneurysm of unspecified site: Secondary | ICD-10-CM

## 2023-07-11 MED ORDER — AMOXICILLIN-POT CLAVULANATE 875-125 MG PO TABS: 1.0000 | ORAL_TABLET | Freq: Two times a day (BID) | ORAL | 0 refills | Status: AC

## 2023-07-11 NOTE — Progress Notes (Signed)
POST OPERATIVE OFFICE NOTE    CC:  F/u for surgery  HPI:  This is a 72 y.o. 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 Karin Lieu. This was done s/p cardiac catheterization on 06/06/23. The pseudoaneurysm was increasing in size so surgical intervention was indicated.     Pt returns today for follow up. Pt states had a lot of redness and swelling around staple line. He says it is very sensitive to anything touching staples with sharp pains. Denies any pain in the hand, weakness or numbness. He reports that he is taking prednisone for RA flare.   Allergies  Allergen Reactions   Methotrexate Dermatitis    Developed Mouth Sores   Nsaids Dermatitis    Developed Mouth Blisters   Plaquenil [Hydroxychloroquine] Other (See Comments)    Terrible Nightmares   Azulfidine [Sulfasalazine] Nausea Only   Crestor [Rosuvastatin] Other (See Comments)    Caused abdominal pain that radiated to the back   Iodinated Contrast Media     GI Intolerance, Made him severely sick   Isosorbide Other (See Comments)    Made him feel weak   Mevacor [Lovastatin] Other (See Comments)    Caused abdominal pain that radiated to the back   Pravachol [Pravastatin] Other (See Comments)    Caused abdominal pain that radiated to the back   Ranexa [Ranolazine Er] Nausea Only    Severe nausea    Current Outpatient Medications  Medication Sig Dispense Refill   acetaminophen (TYLENOL) 650 MG CR tablet Take 1,300 mg by mouth every 8 (eight) hours as needed for pain.     amLODipine (NORVASC) 5 MG tablet Take 2 tablets (10 mg total) by mouth daily. 30 tablet 3   apixaban (ELIQUIS) 5 MG TABS tablet Take 1 tablet (5 mg total) by mouth 2 (two) times daily. (Patient not taking: Reported on 06/30/2023)     aspirin EC 81 MG tablet Take 1 tablet (81 mg total) by mouth daily. Swallow whole. 30 tablet 12   Cholecalciferol 50 MCG (2000 UT) CAPS Take 2,000 Units by mouth daily.      Coenzyme Q10 (COQ10) 200 MG  CAPS Take 200 mg by mouth daily.     diclofenac Sodium (VOLTAREN) 1 % GEL Apply 1 application  topically daily as needed for pain.     ezetimibe (ZETIA) 10 MG tablet TAKE 1 TABLET BY MOUTH DAILY 90 tablet 0   finasteride (PROSCAR) 5 MG tablet Take 1 tablet (5 mg total) by mouth daily. 90 tablet 1   hydrALAZINE (APRESOLINE) 50 MG tablet Take 50 mg by mouth 3 (three) times daily.     leflunomide (ARAVA) 20 MG tablet TAKE 1 TABLET BY MOUTH DAILY 90 tablet 0   lithium carbonate 150 MG capsule Take 3 capsules (450 mg total) by mouth daily. 270 capsule 1   losartan (COZAAR) 100 MG tablet Take 100 mg by mouth daily.     Lysine HCl 1000 MG TABS Take 1,000 mg by mouth 2 (two) times daily.      nitroGLYCERIN (NITROSTAT) 0.4 MG SL tablet Place 1 tablet (0.4 mg total) under the tongue every 5 (five) minutes as needed for chest pain. 25 tablet 4   omeprazole (PRILOSEC) 40 MG capsule Take 1 capsule (40 mg total) by mouth in the morning and at bedtime. 180 capsule 3   oxyCODONE-acetaminophen (PERCOCET/ROXICET) 5-325 MG tablet Take 1 tablet by mouth every 6 (six) hours as needed for moderate pain. (Patient not taking:  Reported on 06/30/2023) 10 tablet 0   predniSONE (DELTASONE) 5 MG tablet Take 1 tablet (5 mg total) by mouth daily as needed. 30 tablet 2   simvastatin (ZOCOR) 5 MG tablet TAKE 1 TABLET BY MOUTH DAILY 90 tablet 2   triamcinolone cream (KENALOG) 0.1 % Apply 1 Application topically 2 (two) times daily. (Patient not taking: Reported on 06/30/2023) 30 g 0   vitamin B-12 (CYANOCOBALAMIN) 1000 MCG tablet Take 1,000 mcg by mouth daily.     No current facility-administered medications for this visit.     ROS:  See HPI  Physical Exam:  Vitals:   07/11/23 1238  BP: (!) 185/71  Pulse: 67  Resp: 18  Temp: 97.8 F (36.6 C)  SpO2: 98%   General: well appearing, well nourished Incision:  right ulnar incision erythematous and mildly edematous, staples removed  Extremities:  right arm well perfused  and warm. 2+ palpable radial pulse. Doppler ulnar and palmar signals. Motor and sensation intact Neuro: alert and oriented   Assessment/Plan:  This is a 72 y.o. 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 Karin Lieu. This was done s/p cardiac catheterization on 06/06/23. The pseudoaneurysm was increasing in size so surgical intervention was indicated. Right hand well perfused and warm with palpable radial pulse. Ulnar doppler and palmar signals. Motor and sensation intact. Incision is intact and healing well. It is edematous and erythematous. More so then infection it looks like it is irritation from the staples. Staples removed today. He has been on prednisone for RA flare and explained this can inhibit wound healing. He is done with his prednisone taper as of today. - Continue to clean with mild soap and water, pat dry - Continue Aspirin, Statin, Eliquis - Prescription for Augmentin sent to his pharmacy for 7 days - he will follow up in 1 week for incision check   Nathanial Rancher, [facility] [phone]   Clinic MD:  Karin Lieu

## 2023-07-18 ENCOUNTER — Ambulatory Visit (INDEPENDENT_AMBULATORY_CARE_PROVIDER_SITE_OTHER): Payer: PPO | Admitting: Physician Assistant

## 2023-07-18 VITALS — BP 148/70 | HR 62 | Temp 98.2°F | Resp 18 | Ht 67.0 in | Wt 163.0 lb

## 2023-07-18 DIAGNOSIS — T81718A Complication of other artery following a procedure, not elsewhere classified, initial encounter: Secondary | ICD-10-CM

## 2023-07-18 DIAGNOSIS — I729 Aneurysm of unspecified site: Secondary | ICD-10-CM

## 2023-07-18 NOTE — Progress Notes (Signed)
POST OPERATIVE OFFICE NOTE    CC:  F/u for surgery  HPI:  Noah Grant is a 72 y.o. 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 This was done for a growing pseudoaneurysm s/p cardiac catheterization on 06/06/2023. At his first follow up with our office his staples were removed. His incision was intact however it was very sensitive to touch and looked very irritated. He was started on a prophylactic course of Augmentin and was to follow up in 1 week.  Pt returns today for follow up.  Pt states his redness around the incision is completely gone. Unfortunately his incision is still very sensitive to touch. He denies any fevers, chills, or drainage from the incision.   Allergies  Allergen Reactions   Methotrexate Dermatitis    Developed Mouth Sores   Nsaids Dermatitis    Developed Mouth Blisters   Plaquenil [Hydroxychloroquine] Other (See Comments)    Terrible Nightmares   Azulfidine [Sulfasalazine] Nausea Only   Crestor [Rosuvastatin] Other (See Comments)    Caused abdominal pain that radiated to the back   Iodinated Contrast Media     GI Intolerance, Made him severely sick   Isosorbide Other (See Comments)    Made him feel weak   Mevacor [Lovastatin] Other (See Comments)    Caused abdominal pain that radiated to the back   Pravachol [Pravastatin] Other (See Comments)    Caused abdominal pain that radiated to the back   Ranexa [Ranolazine Er] Nausea Only    Severe nausea    Current Outpatient Medications  Medication Sig Dispense Refill   acetaminophen (TYLENOL) 650 MG CR tablet Take 1,300 mg by mouth every 8 (eight) hours as needed for pain.     amLODipine (NORVASC) 5 MG tablet Take 2 tablets (10 mg total) by mouth daily. 30 tablet 3   amoxicillin-clavulanate (AUGMENTIN) 875-125 MG tablet Take 1 tablet by mouth 2 (two) times daily for 7 days. 14 tablet 0   apixaban (ELIQUIS) 5 MG TABS tablet Take 1 tablet (5 mg total) by mouth 2 (two)  times daily. (Patient not taking: Reported on 07/18/2023)     aspirin EC 81 MG tablet Take 1 tablet (81 mg total) by mouth daily. Swallow whole. 30 tablet 12   Cholecalciferol 50 MCG (2000 UT) CAPS Take 2,000 Units by mouth daily.      Coenzyme Q10 (COQ10) 200 MG CAPS Take 200 mg by mouth daily.     diclofenac Sodium (VOLTAREN) 1 % GEL Apply 1 application  topically daily as needed for pain.     ezetimibe (ZETIA) 10 MG tablet TAKE 1 TABLET BY MOUTH DAILY 90 tablet 0   finasteride (PROSCAR) 5 MG tablet Take 1 tablet (5 mg total) by mouth daily. 90 tablet 1   hydrALAZINE (APRESOLINE) 50 MG tablet Take 50 mg by mouth 3 (three) times daily.     leflunomide (ARAVA) 20 MG tablet TAKE 1 TABLET BY MOUTH DAILY 90 tablet 0   lithium carbonate 150 MG capsule Take 3 capsules (450 mg total) by mouth daily. 270 capsule 1   losartan (COZAAR) 100 MG tablet Take 100 mg by mouth daily.     Lysine HCl 1000 MG TABS Take 1,000 mg by mouth 2 (two) times daily.      nitroGLYCERIN (NITROSTAT) 0.4 MG SL tablet Place 1 tablet (0.4 mg total) under the tongue every 5 (five) minutes as needed for chest pain. 25 tablet 4   omeprazole (PRILOSEC) 40 MG  capsule Take 1 capsule (40 mg total) by mouth in the morning and at bedtime. 180 capsule 3   oxyCODONE-acetaminophen (PERCOCET/ROXICET) 5-325 MG tablet Take 1 tablet by mouth every 6 (six) hours as needed for moderate pain. 10 tablet 0   predniSONE (DELTASONE) 5 MG tablet Take 1 tablet (5 mg total) by mouth daily as needed. 30 tablet 2   simvastatin (ZOCOR) 5 MG tablet TAKE 1 TABLET BY MOUTH DAILY 90 tablet 2   triamcinolone cream (KENALOG) 0.1 % Apply 1 Application topically 2 (two) times daily. 30 g 0   vitamin B-12 (CYANOCOBALAMIN) 1000 MCG tablet Take 1,000 mcg by mouth daily.     No current facility-administered medications for this visit.     ROS:  See HPI  Physical Exam:   Incision:  right ulnar incision erythema and edema has resolved. No dehiscence or  drainage Extremities:  right hand warm and well perfused with a 2+ radial pulse. Brisk ulnar and palmar signals Neuro: intact motor and sensation of right hand    Assessment/Plan:  This is a 72 y.o. male who is here for repeat incision check  -He underwent right ulnar pseudoaneurysm repair on 06/23/2023 and his staples were removed one week ago. He had some erythema and mild swelling to the incision at his first follow up, so he was given a course of Augmentin. -At repeat incision check today his edema and erythema are completely resolved. He has no dehiscence of the incision or fever/chills -He still has a lot of sensitivity to the incision which I believe will dull with time -His right hand is warm and well perfused with a palpable radial pulse and brisk ulnar and palmar signals -He can follow up with our office as needed   Loel Dubonnet, PA-C [facility] [phone]   Clinic MD:  Karin Lieu

## 2023-07-25 ENCOUNTER — Other Ambulatory Visit: Payer: Self-pay | Admitting: Internal Medicine

## 2023-07-25 DIAGNOSIS — M069 Rheumatoid arthritis, unspecified: Secondary | ICD-10-CM

## 2023-07-25 NOTE — Telephone Encounter (Signed)
Last Fill: 04/21/2023  Labs: 06/30/2023 Creat. 1.29, GFR 59, 06/23/2023 RBC 3.33, Hgb 9.9, Hct 30.0  Next Visit: 09/30/2023  Last Visit: 06/30/2023  DX: Rheumatoid arthritis involving multiple sites, unspecified whether rheumatoid factor present   Current Dose per office note 06/30/2023: leflunomide 20 mg daily   Okay to refill Arava ?

## 2023-08-06 ENCOUNTER — Ambulatory Visit: Payer: PPO | Admitting: Cardiology

## 2023-08-14 DIAGNOSIS — N1832 Chronic kidney disease, stage 3b: Secondary | ICD-10-CM | POA: Diagnosis not present

## 2023-08-14 DIAGNOSIS — M069 Rheumatoid arthritis, unspecified: Secondary | ICD-10-CM | POA: Diagnosis not present

## 2023-08-14 DIAGNOSIS — R0989 Other specified symptoms and signs involving the circulatory and respiratory systems: Secondary | ICD-10-CM | POA: Diagnosis not present

## 2023-08-20 ENCOUNTER — Encounter: Payer: Self-pay | Admitting: Physician Assistant

## 2023-08-20 ENCOUNTER — Ambulatory Visit: Payer: PPO | Attending: Cardiology | Admitting: Physician Assistant

## 2023-08-20 ENCOUNTER — Telehealth: Payer: Self-pay

## 2023-08-20 VITALS — BP 152/76 | HR 57 | Ht 67.0 in | Wt 164.2 lb

## 2023-08-20 DIAGNOSIS — T81718A Complication of other artery following a procedure, not elsewhere classified, initial encounter: Secondary | ICD-10-CM

## 2023-08-20 DIAGNOSIS — E785 Hyperlipidemia, unspecified: Secondary | ICD-10-CM

## 2023-08-20 DIAGNOSIS — I10 Essential (primary) hypertension: Secondary | ICD-10-CM | POA: Diagnosis not present

## 2023-08-20 DIAGNOSIS — I729 Aneurysm of unspecified site: Secondary | ICD-10-CM

## 2023-08-20 DIAGNOSIS — I480 Paroxysmal atrial fibrillation: Secondary | ICD-10-CM | POA: Diagnosis not present

## 2023-08-20 DIAGNOSIS — I2510 Atherosclerotic heart disease of native coronary artery without angina pectoris: Secondary | ICD-10-CM | POA: Diagnosis not present

## 2023-08-20 NOTE — Progress Notes (Signed)
Cardiology Office Note:  .   Date:  08/20/2023  ID:  Noah Grant, DOB 1951/08/29, MRN [id number] PCP: Sharlene Dory, DO  [facility] Providers Cardiologist:  Garwin Brothers, MD Electrophysiologist:  Sherryl Manges, MD     History of Present Illness: .   Noah Grant is a 72 y.o. male with past medical history of CAD s/p DES x 2 to rPDA 08/2020, PAF, myocarditis, bipolar 1 disorder, rheumatoid arthritis, hypertension, hyperlipidemia, CKD stage III, and TIA/CVA. He has a longstanding history of bradycardia.  He was previously referred to Dr. Graciela Husbands due to concern by his nephrologist and rheumatologist that his bradycardia was contributing to weakness and worsening renal function.  He was admitted in June 2023 with weakness, tremors, fever and abdominal pain concerning for sepsis.  He was diagnosed with myopericarditis by symptoms and a cardiac MRI.  Cardiac MRI demonstrated mid wall basal inferolateral LGE and elevated T2 consistent with myocarditis.  He was placed on 53-month course of colchicine.  He also had atrial fibrillation with RVR. Based on Dr. Odessa Fleming last note in January 2024, he had improvement in his heart rate with the decrease of metoprolol dosage.  Metoprolol was discontinued during that visit.  Echocardiogram obtained on 01/02/2023 showed EF 60 to 65%, no regional wall motion abnormality, moderate LVH, mild MR.  Subsequent heart monitor in February 2024 demonstrated minimal heart rate 36, maximal heart rate 120, average heart rate 56, less than 1% PVCs.  More recently, patient was seen by Dr. Tomie China with chest discomfort, a cardiac catheterization was set up for 6/28.  Patient underwent cardiac catheterization on 05/29/2023 which showed widely patent overlapping stents in the RPDA, 60% disease in RPDA unchanged when compared to the previous image, minimal to mild residual CAD.  Medical therapy was recommended.  Ranexa 500 mg twice a day was added.   He returned back  to the emergency room on 06/13/2023 due to swelling and tenderness in the calf area.  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  The case was discussed with Dr. Herbie Baltimore who recommended [facility] test to assess distal perfusion with occlusion of the radial and ulnar artery prior to attempt to hold pressure to reduce the pseudoaneurysm.  Patient passed the test and showed perfusion through both sides however when attempted to undergo compression, patient could not tolerate it due to pain.  A pressure dressing was placed and he was instructed to follow-up with cardiology service.  I saw the patient on 06/20/2023, right upper extremity arterial duplex obtained on the same day showed right ulnar artery pseudoaneurysm enlarged to 3.1 x 1.42 x 1.3 cm.  Patient ultimately underwent right ulnar artery pseudoaneurysm repair by Dr. Nolon Bussing on 06/23/2023.  Postprocedure, he was instructed to take aspirin for 2 days before transition back to Eliquis.  He did require a course of Augmentin on follow-up due to skin irritation.  Patient presents today for follow-up.  His wrist is fully healed at this point.  He still has occasional sharp chest pain that occurs about 2-3 times a week, this typically resolve within 1 minute.  The chest discomfort is chronic and has been going on for several years.  He still has not restarted on the Eliquis, he is adamant that he wished to discontinue the Eliquis.  He is aware that coming off of Eliquis may increase his chance of stroke  luminal irregularities.  Right Coronary Artery  Right Posterior Descending Artery RPDA lesion is 95% stenosed.  Intervention  RPDA lesion Stent CATH VISTA GUIDE 6FR JR4 guide catheter was inserted. Lesion crossed with guidewire using a WIRE COUGAR XT STRL 190CM. Pre-stent angioplasty was performed using a BALLOON SAPPHIRE 2.5X15. A drug-eluting stent was successfully placed using a STENT RESOLUTE ONYX 2.5X18. Post-stent angioplasty was performed using a BALLOON SAPPHIRE Raymond 3.0X15.  Overlapping DES in the PDA (2.5x18 mm Resolute DES and 3.0x8 mm Resolute DES), both post-dilated to high pressure with a 3.0 mm Dougherty balloon Post-Intervention Lesion Assessment The intervention was successful. Pre-interventional TIMI flow is 3. Post-intervention TIMI flow is 3. No complications occurred at this lesion. There is a 0% residual stenosis post intervention.     ECHOCARDIOGRAM  ECHOCARDIOGRAM COMPLETE 01/02/2023  Narrative ECHOCARDIOGRAM REPORT    Patient Name:   Noah Grant Date of Exam: 01/02/2023 Medical Rec #:  [id number]       Height:       67.0 in Accession #:    [id number]      Weight:       171.4 lb Date of Birth:  01-Aug-1951      BSA:          1.894 m Patient Age:    71 years        BP:           154/67 mmHg Patient Gender: M               HR:           57 bpm. Exam Location:  [address]  Procedure: 2D Echo, Cardiac Doppler and Color Doppler  Indications:    I48.91* Unspecified atrial fibrillation  History:        Patient has prior history of Echocardiogram examinations, most recent 05/12/2022. CAD, Stroke and PAD, Signs/Symptoms:Fatigue, Shortness of Breath, Chest Pain and Murmur; Risk Factors:Hypertension, Dyslipidemia and Former Smoker. Bradycardia. Peripheral edema. Pericarditis. Myocarditis. Rheumatic fever. Rheumatoid arthritis.  Sonographer:    Cathie Beams RCS Referring Phys: [phone] Duke Salvia   Sonographer Comments: Technically difficult to position patient due to rheumatoid arthritis discomfort. IMPRESSIONS   1. Left ventricular ejection fraction, by estimation, is 60 to 65%. The left ventricle has normal function. The left ventricle has no regional wall motion abnormalities. There is moderate concentric left ventricular hypertrophy. Left ventricular diastolic parameters were normal. 2. Right ventricular systolic function is normal. The right ventricular size is normal. Tricuspid regurgitation signal is inadequate for assessing PA  pressure. 3. The mitral valve is normal in structure. Mild mitral valve regurgitation. No evidence of mitral stenosis. 4. The aortic valve is tricuspid. Aortic valve regurgitation is not visualized. Aortic valve sclerosis/calcification is present, without any evidence of aortic stenosis.  FINDINGS Left Ventricle: Left ventricular ejection fraction, by estimation, is 60 to 65%. The left ventricle has normal function. The left ventricle has no regional wall motion abnormalities. The left ventricular internal cavity size was normal in size. There is moderate concentric left ventricular hypertrophy. Left ventricular diastolic parameters were normal. Normal left ventricular filling pressure.  Right Ventricle: The right ventricular size is normal. No increase in right ventricular wall thickness. Right ventricular systolic function is normal. Tricuspid regurgitation signal is inadequate for assessing PA pressure.  Left Atrium: Left atrial size was normal in size.  Right Atrium: Right atrial size was normal in size.  Pericardium: There is no evidence of pericardial effusion.  Mitral Valve: The mitral  luminal irregularities.  Right Coronary Artery  Right Posterior Descending Artery RPDA lesion is 95% stenosed.  Intervention  RPDA lesion Stent CATH VISTA GUIDE 6FR JR4 guide catheter was inserted. Lesion crossed with guidewire using a WIRE COUGAR XT STRL 190CM. Pre-stent angioplasty was performed using a BALLOON SAPPHIRE 2.5X15. A drug-eluting stent was successfully placed using a STENT RESOLUTE ONYX 2.5X18. Post-stent angioplasty was performed using a BALLOON SAPPHIRE Raymond 3.0X15.  Overlapping DES in the PDA (2.5x18 mm Resolute DES and 3.0x8 mm Resolute DES), both post-dilated to high pressure with a 3.0 mm Dougherty balloon Post-Intervention Lesion Assessment The intervention was successful. Pre-interventional TIMI flow is 3. Post-intervention TIMI flow is 3. No complications occurred at this lesion. There is a 0% residual stenosis post intervention.     ECHOCARDIOGRAM  ECHOCARDIOGRAM COMPLETE 01/02/2023  Narrative ECHOCARDIOGRAM REPORT    Patient Name:   Noah Grant Date of Exam: 01/02/2023 Medical Rec #:  [id number]       Height:       67.0 in Accession #:    [id number]      Weight:       171.4 lb Date of Birth:  01-Aug-1951      BSA:          1.894 m Patient Age:    71 years        BP:           154/67 mmHg Patient Gender: M               HR:           57 bpm. Exam Location:  [address]  Procedure: 2D Echo, Cardiac Doppler and Color Doppler  Indications:    I48.91* Unspecified atrial fibrillation  History:        Patient has prior history of Echocardiogram examinations, most recent 05/12/2022. CAD, Stroke and PAD, Signs/Symptoms:Fatigue, Shortness of Breath, Chest Pain and Murmur; Risk Factors:Hypertension, Dyslipidemia and Former Smoker. Bradycardia. Peripheral edema. Pericarditis. Myocarditis. Rheumatic fever. Rheumatoid arthritis.  Sonographer:    Cathie Beams RCS Referring Phys: [phone] Duke Salvia   Sonographer Comments: Technically difficult to position patient due to rheumatoid arthritis discomfort. IMPRESSIONS   1. Left ventricular ejection fraction, by estimation, is 60 to 65%. The left ventricle has normal function. The left ventricle has no regional wall motion abnormalities. There is moderate concentric left ventricular hypertrophy. Left ventricular diastolic parameters were normal. 2. Right ventricular systolic function is normal. The right ventricular size is normal. Tricuspid regurgitation signal is inadequate for assessing PA  pressure. 3. The mitral valve is normal in structure. Mild mitral valve regurgitation. No evidence of mitral stenosis. 4. The aortic valve is tricuspid. Aortic valve regurgitation is not visualized. Aortic valve sclerosis/calcification is present, without any evidence of aortic stenosis.  FINDINGS Left Ventricle: Left ventricular ejection fraction, by estimation, is 60 to 65%. The left ventricle has normal function. The left ventricle has no regional wall motion abnormalities. The left ventricular internal cavity size was normal in size. There is moderate concentric left ventricular hypertrophy. Left ventricular diastolic parameters were normal. Normal left ventricular filling pressure.  Right Ventricle: The right ventricular size is normal. No increase in right ventricular wall thickness. Right ventricular systolic function is normal. Tricuspid regurgitation signal is inadequate for assessing PA pressure.  Left Atrium: Left atrial size was normal in size.  Right Atrium: Right atrial size was normal in size.  Pericardium: There is no evidence of pericardial effusion.  Mitral Valve: The mitral  luminal irregularities.  Right Coronary Artery  Right Posterior Descending Artery RPDA lesion is 95% stenosed.  Intervention  RPDA lesion Stent CATH VISTA GUIDE 6FR JR4 guide catheter was inserted. Lesion crossed with guidewire using a WIRE COUGAR XT STRL 190CM. Pre-stent angioplasty was performed using a BALLOON SAPPHIRE 2.5X15. A drug-eluting stent was successfully placed using a STENT RESOLUTE ONYX 2.5X18. Post-stent angioplasty was performed using a BALLOON SAPPHIRE Raymond 3.0X15.  Overlapping DES in the PDA (2.5x18 mm Resolute DES and 3.0x8 mm Resolute DES), both post-dilated to high pressure with a 3.0 mm Dougherty balloon Post-Intervention Lesion Assessment The intervention was successful. Pre-interventional TIMI flow is 3. Post-intervention TIMI flow is 3. No complications occurred at this lesion. There is a 0% residual stenosis post intervention.     ECHOCARDIOGRAM  ECHOCARDIOGRAM COMPLETE 01/02/2023  Narrative ECHOCARDIOGRAM REPORT    Patient Name:   Noah Grant Date of Exam: 01/02/2023 Medical Rec #:  [id number]       Height:       67.0 in Accession #:    [id number]      Weight:       171.4 lb Date of Birth:  01-Aug-1951      BSA:          1.894 m Patient Age:    71 years        BP:           154/67 mmHg Patient Gender: M               HR:           57 bpm. Exam Location:  [address]  Procedure: 2D Echo, Cardiac Doppler and Color Doppler  Indications:    I48.91* Unspecified atrial fibrillation  History:        Patient has prior history of Echocardiogram examinations, most recent 05/12/2022. CAD, Stroke and PAD, Signs/Symptoms:Fatigue, Shortness of Breath, Chest Pain and Murmur; Risk Factors:Hypertension, Dyslipidemia and Former Smoker. Bradycardia. Peripheral edema. Pericarditis. Myocarditis. Rheumatic fever. Rheumatoid arthritis.  Sonographer:    Cathie Beams RCS Referring Phys: [phone] Duke Salvia   Sonographer Comments: Technically difficult to position patient due to rheumatoid arthritis discomfort. IMPRESSIONS   1. Left ventricular ejection fraction, by estimation, is 60 to 65%. The left ventricle has normal function. The left ventricle has no regional wall motion abnormalities. There is moderate concentric left ventricular hypertrophy. Left ventricular diastolic parameters were normal. 2. Right ventricular systolic function is normal. The right ventricular size is normal. Tricuspid regurgitation signal is inadequate for assessing PA  pressure. 3. The mitral valve is normal in structure. Mild mitral valve regurgitation. No evidence of mitral stenosis. 4. The aortic valve is tricuspid. Aortic valve regurgitation is not visualized. Aortic valve sclerosis/calcification is present, without any evidence of aortic stenosis.  FINDINGS Left Ventricle: Left ventricular ejection fraction, by estimation, is 60 to 65%. The left ventricle has normal function. The left ventricle has no regional wall motion abnormalities. The left ventricular internal cavity size was normal in size. There is moderate concentric left ventricular hypertrophy. Left ventricular diastolic parameters were normal. Normal left ventricular filling pressure.  Right Ventricle: The right ventricular size is normal. No increase in right ventricular wall thickness. Right ventricular systolic function is normal. Tricuspid regurgitation signal is inadequate for assessing PA pressure.  Left Atrium: Left atrial size was normal in size.  Right Atrium: Right atrial size was normal in size.  Pericardium: There is no evidence of pericardial effusion.  Mitral Valve: The mitral  Cardiology Office Note:  .   Date:  08/20/2023  ID:  Noah Grant, DOB 1951/08/29, MRN [id number] PCP: Sharlene Dory, DO  [facility] Providers Cardiologist:  Garwin Brothers, MD Electrophysiologist:  Sherryl Manges, MD     History of Present Illness: .   Noah Grant is a 72 y.o. male with past medical history of CAD s/p DES x 2 to rPDA 08/2020, PAF, myocarditis, bipolar 1 disorder, rheumatoid arthritis, hypertension, hyperlipidemia, CKD stage III, and TIA/CVA. He has a longstanding history of bradycardia.  He was previously referred to Dr. Graciela Husbands due to concern by his nephrologist and rheumatologist that his bradycardia was contributing to weakness and worsening renal function.  He was admitted in June 2023 with weakness, tremors, fever and abdominal pain concerning for sepsis.  He was diagnosed with myopericarditis by symptoms and a cardiac MRI.  Cardiac MRI demonstrated mid wall basal inferolateral LGE and elevated T2 consistent with myocarditis.  He was placed on 53-month course of colchicine.  He also had atrial fibrillation with RVR. Based on Dr. Odessa Fleming last note in January 2024, he had improvement in his heart rate with the decrease of metoprolol dosage.  Metoprolol was discontinued during that visit.  Echocardiogram obtained on 01/02/2023 showed EF 60 to 65%, no regional wall motion abnormality, moderate LVH, mild MR.  Subsequent heart monitor in February 2024 demonstrated minimal heart rate 36, maximal heart rate 120, average heart rate 56, less than 1% PVCs.  More recently, patient was seen by Dr. Tomie China with chest discomfort, a cardiac catheterization was set up for 6/28.  Patient underwent cardiac catheterization on 05/29/2023 which showed widely patent overlapping stents in the RPDA, 60% disease in RPDA unchanged when compared to the previous image, minimal to mild residual CAD.  Medical therapy was recommended.  Ranexa 500 mg twice a day was added.   He returned back  to the emergency room on 06/13/2023 due to swelling and tenderness in the calf area.  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  The case was discussed with Dr. Herbie Baltimore who recommended [facility] test to assess distal perfusion with occlusion of the radial and ulnar artery prior to attempt to hold pressure to reduce the pseudoaneurysm.  Patient passed the test and showed perfusion through both sides however when attempted to undergo compression, patient could not tolerate it due to pain.  A pressure dressing was placed and he was instructed to follow-up with cardiology service.  I saw the patient on 06/20/2023, right upper extremity arterial duplex obtained on the same day showed right ulnar artery pseudoaneurysm enlarged to 3.1 x 1.42 x 1.3 cm.  Patient ultimately underwent right ulnar artery pseudoaneurysm repair by Dr. Nolon Bussing on 06/23/2023.  Postprocedure, he was instructed to take aspirin for 2 days before transition back to Eliquis.  He did require a course of Augmentin on follow-up due to skin irritation.  Patient presents today for follow-up.  His wrist is fully healed at this point.  He still has occasional sharp chest pain that occurs about 2-3 times a week, this typically resolve within 1 minute.  The chest discomfort is chronic and has been going on for several years.  He still has not restarted on the Eliquis, he is adamant that he wished to discontinue the Eliquis.  He is aware that coming off of Eliquis may increase his chance of stroke  luminal irregularities.  Right Coronary Artery  Right Posterior Descending Artery RPDA lesion is 95% stenosed.  Intervention  RPDA lesion Stent CATH VISTA GUIDE 6FR JR4 guide catheter was inserted. Lesion crossed with guidewire using a WIRE COUGAR XT STRL 190CM. Pre-stent angioplasty was performed using a BALLOON SAPPHIRE 2.5X15. A drug-eluting stent was successfully placed using a STENT RESOLUTE ONYX 2.5X18. Post-stent angioplasty was performed using a BALLOON SAPPHIRE Raymond 3.0X15.  Overlapping DES in the PDA (2.5x18 mm Resolute DES and 3.0x8 mm Resolute DES), both post-dilated to high pressure with a 3.0 mm Dougherty balloon Post-Intervention Lesion Assessment The intervention was successful. Pre-interventional TIMI flow is 3. Post-intervention TIMI flow is 3. No complications occurred at this lesion. There is a 0% residual stenosis post intervention.     ECHOCARDIOGRAM  ECHOCARDIOGRAM COMPLETE 01/02/2023  Narrative ECHOCARDIOGRAM REPORT    Patient Name:   Noah Grant Date of Exam: 01/02/2023 Medical Rec #:  [id number]       Height:       67.0 in Accession #:    [id number]      Weight:       171.4 lb Date of Birth:  01-Aug-1951      BSA:          1.894 m Patient Age:    71 years        BP:           154/67 mmHg Patient Gender: M               HR:           57 bpm. Exam Location:  [address]  Procedure: 2D Echo, Cardiac Doppler and Color Doppler  Indications:    I48.91* Unspecified atrial fibrillation  History:        Patient has prior history of Echocardiogram examinations, most recent 05/12/2022. CAD, Stroke and PAD, Signs/Symptoms:Fatigue, Shortness of Breath, Chest Pain and Murmur; Risk Factors:Hypertension, Dyslipidemia and Former Smoker. Bradycardia. Peripheral edema. Pericarditis. Myocarditis. Rheumatic fever. Rheumatoid arthritis.  Sonographer:    Cathie Beams RCS Referring Phys: [phone] Duke Salvia   Sonographer Comments: Technically difficult to position patient due to rheumatoid arthritis discomfort. IMPRESSIONS   1. Left ventricular ejection fraction, by estimation, is 60 to 65%. The left ventricle has normal function. The left ventricle has no regional wall motion abnormalities. There is moderate concentric left ventricular hypertrophy. Left ventricular diastolic parameters were normal. 2. Right ventricular systolic function is normal. The right ventricular size is normal. Tricuspid regurgitation signal is inadequate for assessing PA  pressure. 3. The mitral valve is normal in structure. Mild mitral valve regurgitation. No evidence of mitral stenosis. 4. The aortic valve is tricuspid. Aortic valve regurgitation is not visualized. Aortic valve sclerosis/calcification is present, without any evidence of aortic stenosis.  FINDINGS Left Ventricle: Left ventricular ejection fraction, by estimation, is 60 to 65%. The left ventricle has normal function. The left ventricle has no regional wall motion abnormalities. The left ventricular internal cavity size was normal in size. There is moderate concentric left ventricular hypertrophy. Left ventricular diastolic parameters were normal. Normal left ventricular filling pressure.  Right Ventricle: The right ventricular size is normal. No increase in right ventricular wall thickness. Right ventricular systolic function is normal. Tricuspid regurgitation signal is inadequate for assessing PA pressure.  Left Atrium: Left atrial size was normal in size.  Right Atrium: Right atrial size was normal in size.  Pericardium: There is no evidence of pericardial effusion.  Mitral Valve: The mitral

## 2023-08-20 NOTE — Patient Instructions (Signed)
Medication Instructions:  NO CHANGES *If you need a refill on your cardiac medications before your next appointment, please call your pharmacy*   Lab Work: NO LABS If you have labs (blood work) drawn today and your tests are completely normal, you will receive your results only by: MyChart Message (if you have MyChart) OR A paper copy in the mail If you have any lab test that is abnormal or we need to change your treatment, we will call you to review the results.   Testing/Procedures: NO TESTING   Follow-Up: At [facility], you and your health needs are our priority.  As part of our continuing mission to provide you with exceptional heart care, we have created designated Provider Care Teams.  These Care Teams include your primary Cardiologist (physician) and Advanced Practice Providers (APPs -  Physician Assistants and Nurse Practitioners) who all work together to provide you with the care you need, when you need it.   Your next appointment:   3-4 month(s)  Provider:   Bryan Lemma, MD

## 2023-08-21 NOTE — Telephone Encounter (Signed)
I'm ok with change- as long as pt of coming to [facility] [facility] office (or [facility])

## 2023-09-08 ENCOUNTER — Ambulatory Visit: Payer: PPO | Admitting: Psychiatry

## 2023-09-08 ENCOUNTER — Encounter: Payer: Self-pay | Admitting: Psychiatry

## 2023-09-08 DIAGNOSIS — Z79899 Other long term (current) drug therapy: Secondary | ICD-10-CM

## 2023-09-08 DIAGNOSIS — F3132 Bipolar disorder, current episode depressed, moderate: Secondary | ICD-10-CM | POA: Diagnosis not present

## 2023-09-08 DIAGNOSIS — F411 Generalized anxiety disorder: Secondary | ICD-10-CM

## 2023-09-08 DIAGNOSIS — N1831 Chronic kidney disease, stage 3a: Secondary | ICD-10-CM | POA: Diagnosis not present

## 2023-09-08 DIAGNOSIS — R251 Tremor, unspecified: Secondary | ICD-10-CM

## 2023-09-08 MED ORDER — LITHIUM CARBONATE 150 MG PO CAPS
450.0000 mg | ORAL_CAPSULE | Freq: Every day | ORAL | 1 refills | Status: DC
Start: 2023-09-08 — End: 2024-03-08

## 2023-09-08 NOTE — Progress Notes (Signed)
Noah Grant [id number] 1951/04/15 72 y.o.  Subjective:   Patient ID:  Noah Grant is a 72 y.o. (DOB 08-29-1951) male.  Chief Complaint:  Chief Complaint  Patient presents with   Follow-up   Depression   Anxiety    Depression        Associated symptoms include fatigue.  Associated symptoms include no decreased concentration and no suicidal ideas.  Past medical history includes anxiety.   Anxiety Symptoms include nervous/anxious behavior. Patient reports no chest pain, confusion, decreased concentration or suicidal ideas.    Medication Refill Associated symptoms include arthralgias and fatigue. Pertinent negatives include no chest pain or weakness.   Noah Grant presents to the office today for follow-up of bipolar, GAD, poor STM.  seen May 18, 2019 and 11/17/2019 without med changes.    05/17/2020 appointment with the following noted: Word recall problems with neuropsych testing by Dr. Orie Fisherman inconclusive results.  Test reviewed. Still tremor can interfere with eating. Nocturia Dt overactive bladder. Overall mentally ok but too many medical concerns with RA and muscle pain.   Chronic pain issues ongoing.   Htn controlled.  Plan: Lithium level from January 2021 0.8 on 600 mg daily. Gradually it's crept up.  Disc in detail it's relation to renal function and Cr has increased gradually. BC tremor & jerks reduce to 450 mg daily.  02/12/2021 appointment with the following noted: Reduced ithium with no change in tremor. End of December, CVD stent placement and reaction to contrast dye.   Cardiologists ask if he can come off CBZ.   DT DDI. In the last year has felet mentally better than ever.   Plan: Reduce carbamazepine by 1/2 tablet every week until off of it.  04/09/2021 appointment with the following noted: Off CBZ and didn't have any problems.  No worse for mood or anxiety.  Never slept good in life.  Melatonin caused dizziness. Checked lithium level. 06  No  change in tremors in a long time.  Maninly noticeable with fine motor control. Depression is somewhat chronic and waxes and wanes dependent on health issues.  Avoids stressors if possible.  Plan no med changes  10/10/21 appt noted: No Covid.  Hx TIA's.  Tremor seems a little worse but also has RA and started infusions last week. Continues lithium 450 mg HS as only psych med. Patient reports stable mood and denie irritable moods except as noted.  Patient denies any recent difficulty with anxiety except situational.  Patient denies unusual difficulty with sleep initiation or maintenance except awakens with pain chronically. Denies appetite disturbance.  Patient reports that energy and motivation have been good.  Patient denies any difficulty with concentration  But history of word finding from ministrokes..  Patient denies any suicidal ideation.  04/09/22 appt noted: Doing well overall.  RA is a lot worse and changing rheumatologist.  Is tired and anemic with the meds. Mood overall is stable and pleased.  Deaf in left ear.  Tried hearing aids and overall minimal improvement.   Continues  lithium 450 mg HS as only psych med. Tremor is some worse. History TIA's or ministrokes may be contributing.  10/10/22 appt noted: Continues  lithium 450 mg HS as only psych med. 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 Wife survived breast CA. Decline in kidney function.  Pending FU in November. Mood has been ok.  Anxiety is manageable. Tremor is worse and worse as day progresses  03/04/23 appt noted: Continues  lithium 450  mg HS as only psych med. On Xarelto but wants to get off it. Nephro notes Cr 1.4 up to 1.8 07/2022; last 01/29/23 =1.66.  Rec he disc alternative to lithium.   Patient reports stable mood and denies depressed or irritable moods.  Patient denies any recent difficulty with anxiety.  Patient denies difficulty with sleep initiation or maintenance. Denies appetite  disturbance.  Patient reports that energy and motivation have been good.  Patient denies any difficulty with concentration.  Patient denies any suicidal ideation. Card [place], [place] Plan: Continue lithium 450 mg daily but tremor did not get better with reduction last visit. Lithium level 10/18/21 = 0.6, 05/12/22 lithium =0.45 Disc alternative Trileptal but DDI   05/07/23 appt noted: Psych med:  lithium 450 mg HS as only psych med. Last Cr better at 1.36. Disc pros and cons of changing lithium and risk of relapse.   Mood is OK and without much change. Has RA and waxes and wanes and causes fatigue. Won't be given oral steroids.   Still has tremor but better than it was.    09/08/23 appt noted: Psych med:  lithium CR 450 mg HS level 0.6 in June Cr is better .    Had heart Cath was ok with no new stents but has 2. May have small vessel heart dz.   Mood has been ok without major changes. Fatigue from either RA or heart. Sleep is not good mostly DT pain in hips causing awakening.   No SE with lithium.  Has seen nephorologists and rheumatologists and neurologist, [facility].   Lives in [place] now  Previous psych med trials are extensive and include meds for anxiety and mood.  These include  clonidine, buspirone,  Abilify 5 mg,  olanzapine, Geodon, Depakote SE, CBZ,  gabapentin sedation , lamotrigine, bupropion, Strattera,  Prozac, Serzone 700 mg daily, sertraline, duloxetine,  Pristiq which caused rage, mirtazapine, Pamelor, amitriptyline 450 mg/day, Ritalin, Provigil,  temazepam,  Ambien, ProSom, trazodone which was ineffective.  Cerefolin NAC,  Thinks he tried propranolol which caused back pain  Review of Systems:  Review of Systems  Constitutional:  Positive for fatigue.  Cardiovascular:  Negative for chest pain.  Musculoskeletal:  Positive for arthralgias and gait problem.  Neurological:  Positive for tremors. Negative for weakness.       Occ jerks   Psychiatric/Behavioral:  Negative for agitation, behavioral problems, confusion, decreased concentration, dysphoric mood, hallucinations, self-injury, sleep disturbance and suicidal ideas. The patient is nervous/anxious. The patient is not hyperactive.     Medications: I have reviewed the patient's current medications.  Current Outpatient Medications  Medication Sig Dispense Refill   acetaminophen (TYLENOL) 650 MG CR tablet Take 1,300 mg by mouth every 8 (eight) hours as needed for pain.     amLODipine (NORVASC) 5 MG tablet Take 2 tablets (10 mg total) by mouth daily. 30 tablet 3   aspirin EC 81 MG tablet Take 1 tablet (81 mg total) by mouth daily. Swallow whole. 30 tablet 12   Cholecalciferol 50 MCG (2000 UT) CAPS Take 2,000 Units by mouth daily.      Coenzyme Q10 (COQ10) 200 MG CAPS Take 200 mg by mouth daily.     diclofenac Sodium (VOLTAREN) 1 % GEL Apply 1 application  topically daily as needed for pain.     ezetimibe (ZETIA) 10 MG tablet TAKE 1 TABLET BY MOUTH DAILY 90 tablet 0   finasteride (PROSCAR) 5 MG tablet Take 1 tablet (5 mg total) by  mouth daily. 90 tablet 1   hydrALAZINE (APRESOLINE) 100 MG tablet Take 100 mg by mouth 3 (three) times daily.     leflunomide (ARAVA) 20 MG tablet TAKE 1 TABLET BY MOUTH DAILY 90 tablet 0   losartan (COZAAR) 100 MG tablet Take 100 mg by mouth daily.     Lysine HCl 1000 MG TABS Take 1,000 mg by mouth 2 (two) times daily.      nitroGLYCERIN (NITROSTAT) 0.4 MG SL tablet Place 1 tablet (0.4 mg total) under the tongue every 5 (five) minutes as needed for chest pain. 25 tablet 4   omeprazole (PRILOSEC) 40 MG capsule Take 1 capsule (40 mg total) by mouth in the morning and at bedtime. 180 capsule 3   predniSONE (DELTASONE) 5 MG tablet Take 1 tablet (5 mg total) by mouth daily as needed. 30 tablet 2   simvastatin (ZOCOR) 5 MG tablet TAKE 1 TABLET BY MOUTH DAILY 90 tablet 2   triamcinolone cream (KENALOG) 0.1 % Apply 1 Application topically 2 (two) times  daily. 30 g 0   vitamin B-12 (CYANOCOBALAMIN) 1000 MCG tablet Take 1,000 mcg by mouth daily.     lithium carbonate 150 MG capsule Take 3 capsules (450 mg total) by mouth daily. 270 capsule 1   No current facility-administered medications for this visit.    Medication Side Effects: None  Allergies:  Allergies  Allergen Reactions   Methotrexate Dermatitis    Developed Mouth Sores   Nsaids Dermatitis    Developed Mouth Blisters   Plaquenil [Hydroxychloroquine] Other (See Comments)    Terrible Nightmares   Azulfidine [Sulfasalazine] Nausea Only   Crestor [Rosuvastatin] Other (See Comments)    Caused abdominal pain that radiated to the back   Iodinated Contrast Media     GI Intolerance, Made him severely sick   Isosorbide Other (See Comments)    Made him feel weak   Mevacor [Lovastatin] Other (See Comments)    Caused abdominal pain that radiated to the back   Pravachol [Pravastatin] Other (See Comments)    Caused abdominal pain that radiated to the back   Ranexa [Ranolazine Er] Nausea Only    Severe nausea    Past Medical History:  Diagnosis Date   Abdominal aortic atherosclerosis (HCC)    Anemia    likely of chronic disease   Angina pectoris (HCC) 08/30/2020   Arthritis    rheumatoid   Atrial fibrillation (HCC)    Atrial fibrillation with rapid ventricular response (HCC) 05/12/2022   06/20/23 - pt states he's not had any A-fib   Barrett's esophagus without dysplasia 03/25/2019   Formatting of this note might be different from the original. EGD done in 02/2019. Due in 3 years.   Benign prostatic hyperplasia with nocturia 12/29/2018   Bipolar 1 disorder (HCC)    Bipolar 1 disorder, mixed, moderate (HCC) 08/16/2014   Bipolar 2 disorder (HCC) 08/16/2014   Bipolar affective disorder, currently depressed, moderate (HCC) 03/08/2020   Bipolar depression (HCC) 08/16/2014   BPH with urinary obstruction 12/29/2018   Brain ischemia 03/08/2020   Cardiac murmur 08/30/2020   Chest  pain 09/06/2020   Chicken pox    Chronic bronchitis (HCC)    Chronic kidney disease, stage 3a (HCC) 07/21/2019   Cognitive complaints 03/08/2020   Coronary artery disease involving native coronary artery of native heart 09/06/2020   Coronary artery disease involving native coronary artery of native heart with angina pectoris (HCC) 09/06/2020   Depression    Disc degeneration, 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 Last Assessment & Plan:  Well controlled. Continue current medications.   GERD without esophagitis 08/16/2014   Formatting of this note might be different from the original. Last Assessment & Plan:  Well controlled. Continue current medications.   Herpes gingivostomatitis 02/10/2015   High risk medication use 03/23/2018   History of colon polyps 03/25/2019   Formatting of this note might be different from the original. tubular adenoma   History of stroke 11/10/2019   Hyperlipidemia    Hyperphosphatemia 10/30/2022   Hypertension    Ingrowing nail 03/12/2020   Intention tremor 06/16/2019   Labile hypertension 08/16/2014   Formatting of this note might be different from the original. Last Assessment & Plan:  Slightly above goal today secondary to pain. Will continue current regimen. Will check CMP today.   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 Concern over possible lichen planus. His dentist noticed lesions of his oral mucosa.  It was felt to be lichen planus.  He presents to discuss further.  Rarely does the area hurt.He smoked in the distant past EXAM shows several white patches on the buccal mucosa bilaterally.  No other  concerning oral mucosal les   Lithium use 10/30/2022   Long-term use of immunosuppressant medication 03/23/2018   Low testosterone 09/17/2016   Midline thoracic back pain 02/10/2015   Mixed hyperlipidemia 08/16/2014   Myocarditis (HCC) 05/14/2022   Normochromic normocytic anemia 08/16/2014   Normocytic anemia    likely of chronic disease   PAD (peripheral artery disease) (HCC) 03/23/2019   Pain of right hip joint 01/19/2016   Pericarditis 05/12/2022   Rheumatic fever    Rheumatoid arthritis (HCC) 10/29/2018   Formatting of this note might be different from the original. Rheum:  Dr. Sharmon Revere Formerly on MTX - stopped due to mouth sores. Currently started on Arava.  Also on 5 mg prednisone x 3 months during run-in.   Right-sided chest wall pain 04/17/2019   Seasonal allergies 03/23/2018   Sepsis (HCC) 05/11/2022   Stasis dermatitis of both legs 07/29/2022   Statin intolerance 07/31/2020   Stroke (HCC)    3 - TIA's   Task-specific dystonia of hand    right hand   Thoracic radiculopathy 06/16/2019   Vitamin B12 deficiency    Injections   Vitamin D deficiency 09/17/2016    Family History  Problem Relation Age of Onset   Alzheimer's disease Mother 72       Deceased in early 89s   Stroke Father 82       Deceased   Hypertension Father    Alcoholism Father    Alcoholism Brother    Heart disease Maternal Grandfather 63       Deceased   Heart disease Paternal Grandfather 56       Deceased   Hypertension Son      Past Medical History, Surgical history, Social history, and Family history were reviewed and updated as appropriate.   Please see review of systems for further details on the patient's review from today.   Objective:   Physical Exam:  There were no vitals taken for this visit.  Physical Exam Constitutional:  General: He is not in acute distress.    Appearance: He is well-developed.  Musculoskeletal:        General: No deformity.  Neurological:      Mental Status: He is alert and oriented to person, place, and time.     Coordination: Coordination normal.     Comments: Minimal tremor  Psychiatric:        Attention and Perception: Attention normal. He is attentive.        Mood and Affect: Mood normal. Mood is not anxious or depressed. Affect is not labile, angry or inappropriate.        Speech: Speech normal.        Behavior: Behavior normal.        Thought Content: Thought content normal. Thought content is not delusional. Thought content does not include homicidal or suicidal ideation. Thought content does not include suicidal plan.        Cognition and Memory: Cognition normal.        Judgment: Judgment normal.     Comments: Insight is good. Good humor.     Lab Review:     Component Value Date/Time   NA 140 06/30/2023 1103   NA 142 06/03/2023 1019   K 4.4 06/30/2023 1103   CL 109 06/30/2023 1103   CO2 24 06/30/2023 1103   GLUCOSE 84 06/30/2023 1103   BUN 21 06/30/2023 1103   BUN 20 06/03/2023 1019   CREATININE 1.29 (H) 06/30/2023 1103   CALCIUM 9.5 06/30/2023 1103   PROT 6.4 06/30/2023 1103   PROT 5.9 (L) 11/28/2020 0841   ALBUMIN 4.5 06/23/2023 0900   ALBUMIN 4.2 11/28/2020 0841   AST 15 06/30/2023 1103   ALT 16 06/30/2023 1103   ALKPHOS 60 06/23/2023 0900   BILITOT 0.4 06/30/2023 1103   BILITOT 0.3 11/28/2020 0841   GFRNONAA >60 06/23/2023 1506   GFRAA 75 11/28/2020 0841       Component Value Date/Time   WBC 5.8 06/23/2023 1506   RBC 3.33 (L) 06/23/2023 1506   HGB 9.9 (L) 06/23/2023 1506   HGB 11.4 (L) 06/03/2023 1019   HCT 30.0 (L) 06/23/2023 1506   HCT 35.8 (L) 06/03/2023 1019   PLT 200 06/23/2023 1506   PLT 230 06/03/2023 1019   MCV 90.1 06/23/2023 1506   MCV 91 06/03/2023 1019   MCH 29.7 06/23/2023 1506   MCHC 33.0 06/23/2023 1506   RDW 16.2 (H) 06/23/2023 1506   RDW 13.8 06/03/2023 1019   LYMPHSABS 568 (L) 03/31/2023 1200   LYMPHSABS 0.5 (L) 08/30/2020 1139   MONOABS 1.3 (H) 05/12/2022 0201    EOSABS 201 03/31/2023 1200   EOSABS 0.3 08/30/2020 1139   BASOSABS 49 03/31/2023 1200   BASOSABS 0.1 08/30/2020 1139    Lithium Lvl  Date Value Ref Range Status  05/22/2023 0.6 0.6 - 1.2 mmol/L Final     Latest Reference Range & Units 08/16/14 10:13 01/19/16 09:46 12/07/18 08:41 12/23/19 09:51 05/31/20 11:12 07/31/20 11:38 08/30/20 11:39 09/06/20 22:38 09/07/20 03:00 09/25/20 10:34 11/28/20 08:41 12/14/20 10:22 07/02/21 10:48 07/05/21 10:20 07/10/21 10:22 01/02/22 10:50 04/19/22 10:29 05/11/22 17:11 05/11/22 17:43 05/12/22 02:01 05/13/22 01:51 05/14/22 04:07 05/15/22 03:06 05/30/22 10:44 07/29/22 15:25 01/29/23 12:13 03/31/23 12:00 06/03/23 10:19 06/06/23 08:23 06/23/23 09:00 06/23/23 15:06 06/30/23 11:03  Creatinine 0.70 - 1.28 mg/dL 1.1 4.40 3.47 4.25 (H) 1.23 1.34 (H) 1.24 1.26 (H) 1.26 (H) 1.23 1.14 1.36 (H) 1.34 1.54 (H) 1.42 1.44 1.45 (H) 1.67 (H) 1.70 (H) 1.51 (H)  1.29 (H) 1.38 (H) 1.31 (H) 1.41 (H) 1.75 (H) 1.66 (H) 1.36 (H) 1.30 (H) 1.30 (H) 1.35 (H) 1.26 (H) 1.29 (H)  (H): Data is abnormally high  No results found for: "PHENYTOIN", "PHENOBARB", "VALPROATE", "CBMZ"   .res Assessment: Plan:    Bipolar disorder with moderate depression (HCC) - Plan: lithium carbonate 150 MG capsule  Generalized anxiety disorder  Lithium use  Tremor  Chronic kidney disease, stage 3a (HCC)   He has failed multiple other psychiatric medications and does not wish to have further med changes today. Only current psych med is lithium 450 mg daily .  And his mood is good and stable.  Lithium level from January 2021 0.8 on 600 mg daily. Gradually it's crept up.  Disc in detail it's relation to renal function and Cr has increased gradually. CR 1.35 01/18/21  Creatinine 1.75 07/2022, 01/2023 CR 1.66, Cr 03/2023 = 1.36. See latest update above.  Cr has gradually come down over time  Continue lithium 450 mg daily but tremor did not get better with reduction last visit. Lithium level 10/18/21 = 0.6, 05/12/22  lithium =0.45, 05/2023 = 0.6 Check lithium level. Check BMP every 3 mos  Counseled patient regarding potential benefits, risks, and side effects of lithium to include potential risk of lithium affecting thyroid and renal function.  Discussed need for periodic lab monitoring to determine drug level and to assess for potential adverse effects.  Counseled patient regarding signs and symptoms of lithium toxicity and advised that they notify office immediately or seek urgent medical attention if experiencing these signs and symptoms.  Patient advised to contact office with any questions or concerns. Kidney function surpisingly looks better with last year's BMP.   He wants to continue lithium for now.  Disc best alternative mood stabilizer Trileptal but still some Ddi issues but less than CBZ He's failed multiple others.  4-6 mos  Meredith Staggers, MD, DFAPA   Please see After Visit Summary for patient specific instructions.  Future Appointments  Date Time Provider Department Center  09/22/2023 10:15 AM Sharlene Dory, DO [facility]  09/30/2023 11:20 AM Fuller Plan, MD [facility] None  12/17/2023 11:20 AM Marykay Lex, MD [facility] None    No orders of the defined types were placed in this encounter.      -------------------------------

## 2023-09-16 NOTE — Progress Notes (Signed)
Office Visit Note  Patient: Noah Grant             Date of Birth: 08/10/1951           MRN: [id number]             PCP: Sharlene Dory, DO Referring: Sharlene Dory* Visit Date: 09/30/2023   Subjective:  Follow-up   History of Present Illness: Noah Grant is a 72 y.o. 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 daily and prednisone 5 mg daily as needed.  Continues to experience daily joint pain and stiffness worst complaints in his hands and hips.  He feels there is an increase in hand swelling since the last visit.  Addition of 5 mg prednisone he does not feel made a very big difference and is only taking occasionally.  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  In his left hand experiences pain near the base of the third finger and frequently has pain and getting stuck first thing in the morning. Besides arthritis he complains of generalized fatigue and he has to pause after short periods of exertion although he does not feel any specific change in breathing.  He still having frequent chest pain taking oral nitrates up to twice daily.  Previous HPI 06/30/2023 Noah Grant is a 72 y.o. 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 daily since our last visit still has a lot of joint pain limiting his activities uses a cane for support which decreases leg pain.  Still has a lot of shoulder pain especially with trying to reach overhead or behind his back and bilateral hand pain especially in the MCPs.  No specific flareup or exacerbation of swelling.  He did have heart catheterization through right ulnar artery that was complicated by pseudoaneurysm had subsequent surgical repair for this.  Lateral shoulder and hip pain bothers him especially if he lies to his side at night.     Previous HPI 03/31/2023 Noah Grant is a 72 y.o. male here for follow up for seropositive  RA on leflunomide 20 mg p.o. daily we switch medications after last visit.  So far has not seen any significant difference in joint symptoms for better or worse.  He is tolerating the medication without noticeable side effect.  He had follow-up with his PCP office and discussed bilateral foot pain that was apparently unclear if related to peripheral neuropathy or from his rheumatoid arthritis.  Pain is mostly in the anterior half of the foot more severe along the top and across MTPs particularly bad early in the day when weightbearing or with direct pressure.  His mobility is also limited with bilateral hip pain.  Hurts worse on the side of the hips gets much worse after prolonged walking also gets pain if lying on either side in bed from direct pressure.   Previous HPI 01/29/23 Noah Grant is a 72 y.o. male here for follow up for seropositive RA on Actemra 162 mg subcu q. 14 days.  Overall he is feels very poorly like his arthritis is not benefiting at all from the treatment and mobility is worse than a few months ago.  He stopped some cardiac medications with improvement in his dizziness and leg swelling and fatigue.  Recent 2D echo look normal with no evidence of effusion or reduced ejection fraction from previous myocarditis episode.  He has ongoing  bilateral shoulder pain with stiffness that remains throughout the entire day.  He is using a cane for offloading due to pain affecting hips and feet on both sides worse while walking.  He is seeing some diffuse swelling throughout the hands this appears worse at the end of the day does not localize to any particular joints.  Does cause difficulty fully closing his grip.   Previous HPI 10/29/22 Noah Grant is a 72 y.o. 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  He completed the colchicine and is now on just the Actemra treatment.  He is feeling very fatigued limiting his overall activity  level.  He is also been experiencing some additional pain and stiffness in multiple areas.  He had increased swelling in both legs accumulating towards the feet and ankles.  He has had adjustment to beta blocker treatment currently prescribed metoprolol XR 25 mg daily. Bradycardia and heart rate improved now often in the 50s did not notice any difference in the peripheral swelling after the medicine change. He is also still on norvasc and hydralazine for blood pressure control.   Previous HPI 05/30/22 Noah Grant is a 72 y.o. male here for follow up for RA he was on leflunomide recently hospitalized with sepsis and pericarditis without clear underlying infection identified.  Symptoms started with chills developed increasing swelling in his hands and feet with joint pain that was worse in the shoulders and hips.  He developed severe chest pain felt a constriction or tightness around the heart.  The chest pain was sharp did report provocation with deep breaths or lying in left or right positions.  Evaluation was consistent with pericarditis with effusion.  He was discharged on colchicine and received short term steroids and the chest pain is currently doing well.  The flareup of joint pain has also improved with the oral anti-inflammatory medication.   Previous HPI 04/19/22 Noah Grant is a 72 y.o. male here for rheumatoid arthritis previously seeing Dr. Sharmon Revere and on treatment with leflunomide.  Symptoms started a long time ago gradual onset but has been seeing trucks for treatment of this in the past decade or so.  He has joint pain and stiffness involving multiple areas.  Shoulders, wrists, hands, also bilateral hip pains.  Most commonly sees visible swelling or inflammation in his hands.  He has morning stiffness very severe for 30 minutes to an hour but keeps persistent joint stiffness throughout the day.  Also has pain increased when trying to lie still at night.  He has tried a number of  treatments for this over multiple years.  He did not tolerate hydroxychloroquine due to confusion, MTX not tolerated, leflunomide leukopenia, and sulfasalazine mood disturbances.  He never experienced a significant improvement with Humira injection and developed acute conjunctivitis shortly after starting Remicade infusion.   DMARD Hx LEF current MTX GI intolerance HCQ Psychiatric side effects SSZ Psychiatric side effects Humira nonresponder Remicade nonresponder and conjunctivitis   12/2021 Cholesterol 107 TGs 193 HDL 28.9   09/2021 HBV neg HCV neg   Review of Systems  Constitutional:  Positive for fatigue.  HENT:  Negative for mouth sores and mouth dryness.   Eyes:  Negative for dryness.  Respiratory:  Positive for shortness of breath.   Cardiovascular:  Positive for chest pain. Negative for palpitations.  Gastrointestinal:  Negative for blood in stool, constipation and diarrhea.  Endocrine: Negative for increased urination.  Genitourinary:  Negative for involuntary urination.  Musculoskeletal:  Positive for joint pain, gait problem, joint pain, joint swelling, myalgias, muscle weakness, morning stiffness, muscle tenderness and myalgias.  Skin:  Negative for color change, rash, hair loss and sensitivity to sunlight.  Allergic/Immunologic: Positive for susceptible to infections.  Neurological:  Negative for dizziness and headaches.  Hematological:  Negative for swollen glands.  Psychiatric/Behavioral:  Positive for sleep disturbance. Negative for depressed mood. The patient is not nervous/anxious.     PMFS History:  Patient Active Problem List   Diagnosis Date Noted   Trigger finger, left middle finger 09/30/2023   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   Hyperphosphatemia 10/30/2022   Lithium use 10/30/2022   Anemia 09/02/2022   Atrial fibrillation (HCC) 09/02/2022   Stasis dermatitis of both legs 07/29/2022   Myocarditis (HCC) 05/14/2022   Pericarditis 05/12/2022   Atrial  fibrillation with rapid ventricular response (HCC) 05/12/2022   Sepsis (HCC) 05/11/2022   Elevated troponin level not due myocardial infarction 05/11/2022   Lactic acidosis 05/11/2022   Drug therapy 05/18/2021   Hypertension    Normocytic anemia    Arthritis    Bipolar 1 disorder (HCC)    Chicken pox    Chronic bronchitis (HCC)    Depression    Hyperlipidemia    Rheumatic fever    Essential hypertension    Coronary artery disease involving native coronary artery of native heart 09/06/2020   Chest pain 09/06/2020   Coronary artery disease involving native coronary artery of native heart with angina pectoris (HCC) 09/06/2020   Angina pectoris (HCC) 08/30/2020   Cardiac murmur 08/30/2020   Statin intolerance 07/31/2020   Abdominal aortic atherosclerosis (HCC)    Ingrowing nail 03/12/2020   Bipolar affective disorder, currently depressed, moderate (HCC) 03/08/2020   Brain ischemia 03/08/2020   Cognitive complaints 03/08/2020   History of stroke 11/10/2019   Chronic kidney disease, stage 3a (HCC) 07/21/2019   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   Thoracic radiculopathy 06/16/2019   Intention tremor 06/16/2019   Right-sided chest wall pain 04/17/2019   Barrett's esophagus without dysplasia 03/25/2019   History of colon polyps 03/25/2019   PAD (peripheral artery disease) (HCC) 03/23/2019   Benign prostatic hyperplasia with nocturia 12/29/2018   BPH with urinary obstruction 12/29/2018   GAD (generalized anxiety disorder) 11/02/2018   Rheumatoid arthritis (HCC) 10/29/2018   Lichen planus 04/07/2018   High risk medication use 03/23/2018   Seasonal allergies 03/23/2018   Long-term use of immunosuppressant medication 03/23/2018   Low testosterone 09/17/2016   Vitamin B12 deficiency 09/17/2016   Vitamin D deficiency 09/17/2016   Disc degeneration, 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   Pain of right hip joint 01/19/2016   Herpes gingivostomatitis 02/10/2015   Midline thoracic back pain  02/10/2015   Normochromic normocytic anemia 08/16/2014   Bipolar 1 disorder, mixed, moderate (HCC) 08/16/2014   Mixed hyperlipidemia 08/16/2014   Labile hypertension 08/16/2014   Gastroesophageal reflux disease 08/16/2014   Bipolar depression (HCC) 08/16/2014   GERD without esophagitis 08/16/2014   Bipolar 2 disorder (HCC) 08/16/2014    Past Medical History:  Diagnosis Date   Abdominal aortic atherosclerosis (HCC)    Anemia    likely of chronic disease   Angina pectoris (HCC) 08/30/2020   Arthritis    rheumatoid   Atrial fibrillation (HCC)    Atrial fibrillation with rapid ventricular response (HCC) 05/12/2022   06/20/23 - pt states he's not had any A-fib   Barrett's esophagus without dysplasia 03/25/2019   Formatting of this note might be  different from the original. EGD done in 02/2019. Due in 3 years.   Benign prostatic hyperplasia with nocturia 12/29/2018   Bipolar 1 disorder (HCC)    Bipolar 1 disorder, mixed, moderate (HCC) 08/16/2014   Bipolar 2 disorder (HCC) 08/16/2014   Bipolar affective disorder, currently depressed, moderate (HCC) 03/08/2020   Bipolar depression (HCC) 08/16/2014   BPH with urinary obstruction 12/29/2018   Brain ischemia 03/08/2020   Cardiac murmur 08/30/2020   Chest pain 09/06/2020   Chicken pox    Chronic bronchitis (HCC)    Chronic kidney disease, stage 3a (HCC) 07/21/2019   Cognitive complaints 03/08/2020   Coronary artery disease involving native coronary artery of native heart 09/06/2020   Coronary artery disease involving native coronary artery of native heart with angina pectoris (HCC) 09/06/2020   Depression    Disc degeneration, 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 Last Assessment &  Plan:  Well controlled. Continue current medications.   GERD without esophagitis 08/16/2014   Formatting of this note might be different from the original. Last Assessment & Plan:  Well controlled. Continue current medications.   Herpes gingivostomatitis 02/10/2015   High risk medication use 03/23/2018   History of colon polyps 03/25/2019   Formatting of this note might be different from the original. tubular adenoma   History of stroke 11/10/2019   Hyperlipidemia    Hyperphosphatemia 10/30/2022   Hypertension    Ingrowing nail 03/12/2020   Intention tremor 06/16/2019   Labile hypertension 08/16/2014   Formatting of this note might be different from the original. Last Assessment & Plan:  Slightly above goal today secondary to pain. Will continue current regimen. Will check CMP today.   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 Concern over possible lichen planus. His dentist noticed lesions of his oral mucosa.  It was felt to be lichen planus.  He presents to discuss further.  Rarely does the area hurt.He smoked in the distant past EXAM shows several white patches on the buccal mucosa bilaterally.  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 Rheum:  Dr. Sharmon Revere Formerly on MTX - stopped due to mouth sores. Currently started on Arava.  Also on 5 mg  prednisone x 3 months during run-in.   Right-sided chest wall pain 04/17/2019   Seasonal allergies 03/23/2018   Sepsis (HCC) 05/11/2022   Stasis dermatitis of both legs 07/29/2022   Statin intolerance 07/31/2020   Stroke (HCC)    3 - TIA's   Task-specific dystonia of hand    right hand   Thoracic radiculopathy 06/16/2019   Vitamin B12 deficiency    Injections  Vitamin D deficiency 09/17/2016    Family History  Problem Relation Age of Onset   Alzheimer's disease Mother 61       Deceased in early 74s   Stroke Father 84       Deceased   Hypertension Father    Alcoholism Father    Alcoholism Brother    Heart disease Maternal Grandfather 53       Deceased   Heart disease Paternal Grandfather 52       Deceased   Hypertension Son    Past Surgical History:  Procedure Laterality Date   CLAVICLE SURGERY     Right, Hardware placed   CORONARY STENT INTERVENTION N/A 09/06/2020   Procedure: CORONARY STENT INTERVENTION;  Surgeon: Tonny Bollman, MD;  Location: [facility] INVASIVE CV LAB;  Service: Cardiovascular;  Laterality: N/A;   FALSE ANEURYSM REPAIR Right 06/23/2023   Procedure: RIGHT ULNAR ARTERY PSEUDOANEURYSM REPAIR;  Surgeon: [name], MD;  Location[facility] OR;  Service: Vascular;  Laterality: Right;   ingrown toenail Bilateral    great toes   LEFT HEART CATH AND CORONARY ANGIOGRAPHY N/A 09/06/2020   Procedure: LEFT HEART CATH AND CORONARY ANGIOGRAPHY;  Surgeon: Tonny Bollman, MD;  Location: [facility] INVASIVE CV LAB;  Service: Cardiovascular;  Laterality: N/A;   LEFT HEART CATH AND CORONARY ANGIOGRAPHY N/A 06/06/2023   Procedure: LEFT HEART CATH AND CORONARY ANGIOGRAPHY;  Surgeon: Marykay Lex, MD;  Location: [facility] INVASIVE CV LAB;  Service: Cardiovascular;  Laterality: N/A;   WISDOM TOOTH EXTRACTION     Social History   Social History Narrative   Not on file   Immunization History  Administered Date(s) Administered   Fluad Quad(high Dose 65+) 09/03/2021, 09/04/2022,  08/29/2023   Influenza, High Dose Seasonal PF 09/10/2019, 09/03/2020   Influenza,inj,Quad PF,6+ Mos 08/22/2014, 10/04/2015   Moderna SARS-COV2 Booster Vaccination 10/31/2020, 07/04/2021   Moderna Sars-Covid-2 Vaccination 01/17/2020, 02/13/2020   PNEUMOCOCCAL CONJUGATE-20 01/02/2022   Pfizer(Comirnaty)Fall Seasonal Vaccine 12 years and older 09/18/2023   Zoster Recombinant(Shingrix) 07/02/2017, 09/02/2017   Zoster, Live 08/17/2011     Objective: Vital Signs: BP 136/66 (BP Location: Left Arm, Patient Position: Sitting, Cuff Size: Normal)   Pulse (!) 54   Resp 16   Ht 5\' 7"  (1.702 m)   Wt 164 lb (74.4 kg)   BMI 25.69 kg/m    Physical Exam Eyes:     Conjunctiva/sclera: Conjunctivae normal.  Cardiovascular:     Rate and Rhythm: Normal rate and regular rhythm.  Pulmonary:     Effort: Pulmonary effort is normal.     Breath sounds: Normal breath sounds.  Musculoskeletal:     Comments: 1+ pitting pedal edema on both lower extremities  Lymphadenopathy:     Cervical: No cervical adenopathy.  Skin:    General: Skin is warm and dry.     Comments: Bilateral lower extremity petechiae, mild chronic skin hyperpigmentation and thickening  Neurological:     Mental Status: He is alert.  Psychiatric:        Mood and Affect: Mood normal.      Musculoskeletal Exam:  Elbows full ROM no tenderness or swelling Wrists full ROM no tenderness or swelling Chronic appearing thickening around MCP joints both hands, left third digit with a tender palpable nodule proximal to the MCP joint on the volar surface Hips limited internal and external rotation range of motion, lateral tenderness to pressure no radiation Knees full ROM no tenderness or swelling  Investigation: No additional findings.  Imaging: No results found.  Recent Labs: Lab Results  Component Value Date   WBC 8.9 09/20/2023   HGB 11.2 (L) 09/20/2023   PLT 182 09/20/2023   NA 138 09/20/2023   K 3.7 09/20/2023   CL 106  09/20/2023   CO2 22 09/20/2023   GLUCOSE 110 (H) 09/20/2023   BUN 23 09/20/2023   CREATININE 1.36 (H) 09/20/2023   BILITOT 0.3 09/22/2023   ALKPHOS 66 09/22/2023   AST 15 09/22/2023   ALT 17 09/22/2023   PROT 6.8 09/22/2023   ALBUMIN 4.6 09/22/2023   CALCIUM 9.2 09/20/2023   GFRAA 75 11/28/2020   QFTBGOLDPLUS NEGATIVE 04/19/2022    Speciality Comments: No specialty comments available.  Procedures:  No procedures performed Allergies: Methotrexate, Nsaids, Plaquenil [hydroxychloroquine], Azulfidine [sulfasalazine], Crestor [rosuvastatin], Iodinated contrast media, Isosorbide, Mevacor [lovastatin], Pravachol [pravastatin], and Ranexa [ranolazine er]   Assessment / Plan:     Visit Diagnoses: Rheumatoid arthritis involving multiple sites, unspecified whether rheumatoid factor present (HCC) - Plan: Sedimentation rate, C-reactive protein  Believe RA is reasonably well-controlled he also has degenerative arthritis and functional limitations related to his cardiovascular disease.  Fatigue and lower extremity swelling with continued antihypertensives titration I discussed that it can take a while for his body to adjust after many years of severe hypertension.  Rechecking sed rate and CRP for inflammatory disease activity monitoring.  Plan to continue on leflunomide 20 mg daily with prednisone 5 mg daily as needed.  High risk medication use - leflunomide 20 mg daily  Recent blood count metabolic panel from within the past 2 weeks reviewed these were normal.  Estimated GFR around 60 no problem for continuing the leflunomide.  No serious interval infections.  Long term (current) use of systemic steroids - prednisone 5 mg daily as needed  Currently on very minimal dose 5mg  on less than 30 days out of past 3 months.  Trigger finger, left middle finger  Discussed left hand trigger finger probably related to combination of use and inflammatory arthritis and possibly with cane use.  Discussed  possible treatment options.  He is not interested in any procedural intervention at this time.  Discussed trying to use a finger splinting overnight to prevent the morning symptoms would be a good candidate I believe for local ultrasound-guided injection.  Orders: Orders Placed This Encounter  Procedures   Sedimentation rate   C-reactive protein   No orders of the defined types were placed in this encounter.    Follow-Up Instructions: Return in about 3 months (around 12/31/2023) for RA on LEF/GC f/u 3mos.   Fuller Plan, MD  Note - This record has been created using [employer].  Chart creation errors have been sought, but may not always  have been located. Such creation errors do not reflect on  the standard of medical care.

## 2023-09-18 ENCOUNTER — Other Ambulatory Visit (HOSPITAL_BASED_OUTPATIENT_CLINIC_OR_DEPARTMENT_OTHER): Payer: Self-pay

## 2023-09-18 MED ORDER — COMIRNATY 30 MCG/0.3ML IM SUSY
0.3000 mL | PREFILLED_SYRINGE | Freq: Once | INTRAMUSCULAR | 0 refills | Status: AC
  Filled 2023-09-18: qty 0.3, 1d supply, fill #0

## 2023-09-19 ENCOUNTER — Other Ambulatory Visit: Payer: Self-pay | Admitting: Cardiology

## 2023-09-20 ENCOUNTER — Emergency Department (HOSPITAL_BASED_OUTPATIENT_CLINIC_OR_DEPARTMENT_OTHER)
Admission: EM | Admit: 2023-09-20 | Discharge: 2023-09-20 | Disposition: A | Discharge status: Home / Self Care | Payer: PPO | Attending: Emergency Medicine | Admitting: Emergency Medicine

## 2023-09-20 ENCOUNTER — Encounter (HOSPITAL_BASED_OUTPATIENT_CLINIC_OR_DEPARTMENT_OTHER): Payer: Self-pay

## 2023-09-20 ENCOUNTER — Other Ambulatory Visit: Payer: Self-pay

## 2023-09-20 DIAGNOSIS — N3091 Cystitis, unspecified with hematuria: Secondary | ICD-10-CM | POA: Diagnosis not present

## 2023-09-20 DIAGNOSIS — N189 Chronic kidney disease, unspecified: Secondary | ICD-10-CM | POA: Insufficient documentation

## 2023-09-20 DIAGNOSIS — Z7901 Long term (current) use of anticoagulants: Secondary | ICD-10-CM | POA: Insufficient documentation

## 2023-09-20 DIAGNOSIS — I129 Hypertensive chronic kidney disease with stage 1 through stage 4 chronic kidney disease, or unspecified chronic kidney disease: Secondary | ICD-10-CM | POA: Diagnosis not present

## 2023-09-20 DIAGNOSIS — Z7982 Long term (current) use of aspirin: Secondary | ICD-10-CM | POA: Insufficient documentation

## 2023-09-20 DIAGNOSIS — I2510 Atherosclerotic heart disease of native coronary artery without angina pectoris: Secondary | ICD-10-CM | POA: Diagnosis not present

## 2023-09-20 DIAGNOSIS — R319 Hematuria, unspecified: Secondary | ICD-10-CM | POA: Diagnosis present

## 2023-09-20 DIAGNOSIS — Z79899 Other long term (current) drug therapy: Secondary | ICD-10-CM | POA: Insufficient documentation

## 2023-09-20 DIAGNOSIS — N3090 Cystitis, unspecified without hematuria: Secondary | ICD-10-CM | POA: Diagnosis not present

## 2023-09-20 LAB — CBC WITH DIFFERENTIAL/PLATELET
Abs Immature Granulocytes: 0.02 10*3/uL (ref 0.00–0.07)
Basophils Absolute: 0.1 10*3/uL (ref 0.0–0.1)
Basophils Relative: 1 %
Eosinophils Absolute: 0.1 10*3/uL (ref 0.0–0.5)
Eosinophils Relative: 1 %
HCT: 34.6 % — ABNORMAL LOW (ref 39.0–52.0)
Hemoglobin: 11.2 g/dL — ABNORMAL LOW (ref 13.0–17.0)
Immature Granulocytes: 0 %
Lymphocytes Relative: 3 %
Lymphs Abs: 0.3 10*3/uL — ABNORMAL LOW (ref 0.7–4.0)
MCH: 29.9 pg (ref 26.0–34.0)
MCHC: 32.4 g/dL (ref 30.0–36.0)
MCV: 92.3 fL (ref 80.0–100.0)
Monocytes Absolute: 0.9 10*3/uL (ref 0.1–1.0)
Monocytes Relative: 10 %
Neutro Abs: 7.5 10*3/uL (ref 1.7–7.7)
Neutrophils Relative %: 85 %
Platelets: 182 10*3/uL (ref 150–400)
RBC: 3.75 MIL/uL — ABNORMAL LOW (ref 4.22–5.81)
RDW: 12.5 % (ref 11.5–15.5)
WBC: 8.9 10*3/uL (ref 4.0–10.5)
nRBC: 0 % (ref 0.0–0.2)

## 2023-09-20 LAB — URINALYSIS, MICROSCOPIC (REFLEX)
RBC / HPF: >50 RBC/hpf (ref 0–5)
WBC, UA: >50 WBC/hpf (ref 0–5)

## 2023-09-20 LAB — URINALYSIS, ROUTINE W REFLEX MICROSCOPIC

## 2023-09-20 LAB — BASIC METABOLIC PANEL
Anion gap: 10 (ref 5–15)
BUN: 23 mg/dL (ref 8–23)
CO2: 22 mmol/L (ref 22–32)
Calcium: 9.2 mg/dL (ref 8.9–10.3)
Chloride: 106 mmol/L (ref 98–111)
Creatinine, Ser: 1.36 mg/dL — ABNORMAL HIGH (ref 0.61–1.24)
GFR, Estimated: 56 mL/min — ABNORMAL LOW (ref >60)
Glucose, Bld: 110 mg/dL — ABNORMAL HIGH (ref 70–99)
Potassium: 3.7 mmol/L (ref 3.5–5.1)
Sodium: 138 mmol/L (ref 135–145)

## 2023-09-20 MED ORDER — CEPHALEXIN 250 MG PO CAPS
500.0000 mg | ORAL_CAPSULE | Freq: Once | ORAL | Status: AC
  Administered 2023-09-20: 500 mg via ORAL
  Filled 2023-09-20: qty 2

## 2023-09-20 MED ORDER — CEPHALEXIN 500 MG PO CAPS: 500.0000 mg | ORAL_CAPSULE | Freq: Two times a day (BID) | ORAL | 0 refills | Status: DC

## 2023-09-20 NOTE — ED Triage Notes (Signed)
The patient noticed he had blood and clots in his urine today. He stated it burns when he urinates. No fever or ABD pain.

## 2023-09-20 NOTE — ED Provider Notes (Addendum)
[place] [facility] AT [facility] Provider Note   CSN: [id number] Arrival date & time: 09/20/23  4098     History  Chief Complaint  Patient presents with   Hematuria    Noah Grant is a 72 y.o. male.  Patient is a 72 year old male with a history of anxiety, coronary artery disease, paroxysmal atrial fibrillation, rheumatoid arthritis, hypertension, hyperlipidemia, chronic kidney disease who presents with hematuria.  During the night, he noticed some urinary urgency and frequency.  This morning he noticed that there was blood tinged urine and clots in his urine.  He says he has a little bit more difficulty getting his urine out.  He denies any abdominal pain.  No fevers.  No nausea or vomiting.  He is on a baby aspirin but no other anticoagulants.  He previously was on Eliquis but stopped it after he had a recent ulnar artery aneurysm repair and has not restarted it.       Home Medications Prior to Admission medications   Medication Sig Start Date End Date Taking? Authorizing Provider  cephALEXin (KEFLEX) 500 MG capsule Take 1 capsule (500 mg total) by mouth 2 (two) times daily. 09/20/23  Yes Rolan Bucco, MD  acetaminophen (TYLENOL) 650 MG CR tablet Take 1,300 mg by mouth every 8 (eight) hours as needed for pain.    [provider]  amLODipine (NORVASC) 5 MG tablet Take 2 tablets (10 mg total) by mouth daily. 02/24/23   Sharlene Dory, DO  aspirin EC 81 MG tablet Take 1 tablet (81 mg total) by mouth daily. Swallow whole. 06/24/23   Dara Lords, PA-C  Cholecalciferol 50 MCG (2000 UT) CAPS Take 2,000 Units by mouth daily.     [provider]  Coenzyme Q10 (COQ10) 200 MG CAPS Take 200 mg by mouth daily.    [provider]  diclofenac Sodium (VOLTAREN) 1 % GEL Apply 1 application  topically daily as needed for pain.    [provider]  ezetimibe (ZETIA) 10 MG tablet Take 1 tablet (10 mg total) by mouth daily.  09/19/23   Revankar, Aundra Dubin, MD  finasteride (PROSCAR) 5 MG tablet Take 1 tablet (5 mg total) by mouth daily. 07/08/23   Sharlene Dory, DO  hydrALAZINE (APRESOLINE) 100 MG tablet Take 100 mg by mouth 3 (three) times daily.    [provider]  leflunomide (ARAVA) 20 MG tablet TAKE 1 TABLET BY MOUTH DAILY 07/25/23   Rice, Jamesetta Orleans, MD  lithium carbonate 150 MG capsule Take 3 capsules (450 mg total) by mouth daily. 09/08/23   Cottle, Steva Ready., MD  losartan (COZAAR) 100 MG tablet Take 100 mg by mouth daily.    [provider]  Lysine HCl 1000 MG TABS Take 1,000 mg by mouth 2 (two) times daily.     [provider]  nitroGLYCERIN (NITROSTAT) 0.4 MG SL tablet Place 1 tablet (0.4 mg total) under the tongue every 5 (five) minutes as needed for chest pain. 04/04/23   Revankar, Aundra Dubin, MD  omeprazole (PRILOSEC) 40 MG capsule Take 1 capsule (40 mg total) by mouth in the morning and at bedtime. 02/24/23   Sharlene Dory, DO  predniSONE (DELTASONE) 5 MG tablet Take 1 tablet (5 mg total) by mouth daily as needed. 06/30/23   Fuller Plan, MD  simvastatin (ZOCOR) 5 MG tablet TAKE 1 TABLET BY MOUTH DAILY 06/24/23   Sharlene Dory, DO  triamcinolone cream (KENALOG) 0.1 %  Apply 1 Application topically 2 (two) times daily. 06/13/23   Terrilee Files, MD  vitamin B-12 (CYANOCOBALAMIN) 1000 MCG tablet Take 1,000 mcg by mouth daily.    [provider]      Allergies    Methotrexate, Nsaids, Plaquenil [hydroxychloroquine], Azulfidine [sulfasalazine], Crestor [rosuvastatin], Iodinated contrast media, Isosorbide, Mevacor [lovastatin], Pravachol [pravastatin], and Ranexa [ranolazine er]    Review of Systems   Review of Systems  Constitutional:  Negative for chills, diaphoresis, fatigue and fever.  HENT:  Negative for congestion, rhinorrhea and sneezing.   Eyes: Negative.   Respiratory:  Negative for cough, chest tightness and shortness of breath.    Cardiovascular:  Negative for chest pain and leg swelling.  Gastrointestinal:  Negative for abdominal pain, blood in stool, diarrhea, nausea and vomiting.  Genitourinary:  Positive for frequency, hematuria and urgency. Negative for difficulty urinating and flank pain.  Musculoskeletal:  Negative for arthralgias and back pain.  Skin:  Negative for rash.  Neurological:  Negative for dizziness, speech difficulty, weakness, numbness and headaches.    Physical Exam Updated Vital Signs BP (!) 162/65   Pulse 65   Temp 98.1 F (36.7 C) (Oral)   Resp 18   Ht 5\' 7"  (1.702 m)   Wt 74 kg   SpO2 98%   BMI 25.55 kg/m  Physical Exam Constitutional:      Appearance: He is well-developed.  HENT:     Head: Normocephalic and atraumatic.  Eyes:     Pupils: Pupils are equal, round, and reactive to light.  Cardiovascular:     Rate and Rhythm: Normal rate and regular rhythm.     Heart sounds: Murmur (States has a known murmur) heard.  Pulmonary:     Effort: Pulmonary effort is normal. No respiratory distress.     Breath sounds: Normal breath sounds. No wheezing or rales.  Chest:     Chest wall: No tenderness.  Abdominal:     General: Bowel sounds are normal.     Palpations: Abdomen is soft.     Tenderness: There is no abdominal tenderness. There is no guarding or rebound.  Musculoskeletal:        General: Normal range of motion.     Cervical back: Normal range of motion and neck supple.  Lymphadenopathy:     Cervical: No cervical adenopathy.  Skin:    General: Skin is warm and dry.     Findings: No rash.  Neurological:     Mental Status: He is alert and oriented to person, place, and time.     ED Results / Procedures / Treatments   Labs (all labs ordered are listed, but only abnormal results are displayed) Labs Reviewed  URINALYSIS, ROUTINE W REFLEX MICROSCOPIC - Abnormal; Notable for the following components:      Result Value   Color, Urine BROWN (*)    APPearance TURBID (*)     Glucose, UA   (*)    Value: TEST NOT REPORTED DUE TO COLOR INTERFERENCE OF URINE PIGMENT   Hgb urine dipstick   (*)    Value: TEST NOT REPORTED DUE TO COLOR INTERFERENCE OF URINE PIGMENT   Bilirubin Urine   (*)    Value: TEST NOT REPORTED DUE TO COLOR INTERFERENCE OF URINE PIGMENT   Ketones, ur   (*)    Value: TEST NOT REPORTED DUE TO COLOR INTERFERENCE OF URINE PIGMENT   Protein, ur   (*)    Value: TEST NOT REPORTED DUE TO COLOR INTERFERENCE  OF URINE PIGMENT   Nitrite   (*)    Value: TEST NOT REPORTED DUE TO COLOR INTERFERENCE OF URINE PIGMENT   Leukocytes,Ua   (*)    Value: TEST NOT REPORTED DUE TO COLOR INTERFERENCE OF URINE PIGMENT   All other components within normal limits  BASIC METABOLIC PANEL - Abnormal; Notable for the following components:   Glucose, Bld 110 (*)    Creatinine, Ser 1.36 (*)    GFR, Estimated 56 (*)    All other components within normal limits  CBC WITH DIFFERENTIAL/PLATELET - Abnormal; Notable for the following components:   RBC 3.75 (*)    Hemoglobin 11.2 (*)    HCT 34.6 (*)    Lymphs Abs 0.3 (*)    All other components within normal limits  URINALYSIS, MICROSCOPIC (REFLEX) - Abnormal; Notable for the following components:   Bacteria, UA MANY (*)    All other components within normal limits  URINE CULTURE    EKG None  Radiology No results found.  Procedures Procedures    Medications Ordered in ED Medications  cephALEXin (KEFLEX) capsule 500 mg (has no administration in time range)    ED Course/ Medical Decision Making/ A&P                                 Medical Decision Making Problems Addressed: Hemorrhagic cystitis: undiagnosed new problem with uncertain prognosis  Amount and/or Complexity of Data Reviewed Labs: ordered. Decision-making details documented in ED Course.  Risk Prescription drug management. Decision regarding hospitalization.   Patient is a 72 year old who presents with hematuria.  Urine is blood-tinged.   He is afebrile.  No abdominal pain.  Bladder scan showed no postvoid residual urine.  No suggestions of urinary retention.  He does not have pain which be more concerning for kidney stone.  His labs are nonconcerning.  He has some mild anemia and mild elevation in his creatinine which based on chart review is similar to prior values.  His urine does show a large amount of bacteria and WBCs as well as RBCs.  Will start him on antibiotics.  He was given dose of Keflex in the ED.  He has an appointment to follow-up with his primary care doctor on Monday which is in 2 days.  Will discuss with his PCP whether he needs urology referral.  He was discharged home in good condition.  Return precautions were given.  Final Clinical Impression(s) / ED Diagnoses Final diagnoses:  Hemorrhagic cystitis    Rx / DC Orders ED Discharge Orders          Ordered    cephALEXin (KEFLEX) 500 MG capsule  2 times daily        09/20/23 1039              Rolan Bucco, MD 09/20/23 1044    Rolan Bucco, MD 09/20/23 1158

## 2023-09-20 NOTE — ED Notes (Signed)
Fall risk armband Fall risk sign on door Fall risk socks

## 2023-09-20 NOTE — Discharge Instructions (Signed)
Take the antibiotics as discussed.  Drink fluids to flush out the clots.  Follow-up with your doctor on Monday and discuss whether you need to have referral to urology.  Return to the emergency room if you have any worsening symptoms such as fevers, abdominal pain, inability to urinate, vomiting or other worsening symptoms.

## 2023-09-21 LAB — URINE CULTURE

## 2023-09-22 ENCOUNTER — Encounter: Payer: Self-pay | Admitting: Family Medicine

## 2023-09-22 ENCOUNTER — Ambulatory Visit: Payer: PPO | Admitting: Family Medicine

## 2023-09-22 VITALS — BP 142/76 | HR 69 | Temp 97.9°F | Resp 16 | Ht 67.0 in | Wt 162.2 lb

## 2023-09-22 DIAGNOSIS — Z0000 Encounter for general adult medical examination without abnormal findings: Secondary | ICD-10-CM

## 2023-09-22 DIAGNOSIS — I10 Essential (primary) hypertension: Secondary | ICD-10-CM | POA: Diagnosis not present

## 2023-09-22 LAB — HEPATIC FUNCTION PANEL
ALT: 17 U/L (ref 0–53)
AST: 15 U/L (ref 0–37)
Albumin: 4.6 g/dL (ref 3.5–5.2)
Alkaline Phosphatase: 66 U/L (ref 39–117)
Bilirubin, Direct: 0.1 mg/dL (ref 0.0–0.3)
Total Bilirubin: 0.3 mg/dL (ref 0.2–1.2)
Total Protein: 6.8 g/dL (ref 6.0–8.3)

## 2023-09-22 LAB — LIPID PANEL
Cholesterol: 121 mg/dL (ref 0–200)
HDL: 31.2 mg/dL — ABNORMAL LOW (ref >39.00)
LDL Cholesterol: 50 mg/dL (ref 0–99)
NonHDL: 89.62
Total CHOL/HDL Ratio: 4
Triglycerides: 197 mg/dL — ABNORMAL HIGH (ref 0.0–149.0)
VLDL: 39.4 mg/dL (ref 0.0–40.0)

## 2023-09-22 MED ORDER — AMLODIPINE BESYLATE 10 MG PO TABS: 10.0000 mg | ORAL_TABLET | Freq: Every day | ORAL | 3 refills | Status: DC

## 2023-09-22 MED ORDER — OLMESARTAN MEDOXOMIL 40 MG PO TABS
40.0000 mg | ORAL_TABLET | Freq: Every day | ORAL | 5 refills | Status: DC
Start: 2023-09-22 — End: 2024-03-15

## 2023-09-22 NOTE — Patient Instructions (Addendum)
Give [place] 2-3 business days to get the results of your labs back.   Keep the diet clean and stay active.  Please get your tetanus booster shot at the pharmacy.   OK to use Debrox (peroxide) in the ear to loosen up wax. Also recommend using a bulb syringe (for removing boogers from baby's noses) to flush through warm water and vinegar (3-4:1 ratio). An alternative, though more expensive, is an elephant ear washer wax removal kit. Do not use Q-tips as this can impact wax further.  Let us know if you need anything.

## 2023-09-22 NOTE — Progress Notes (Signed)
Chief Complaint  Patient presents with   Annual Exam    Annual Exam    Well Male Noah Grant is here for a complete physical.   His last physical was >1 year ago.  Current diet: in general, a "healthy" diet.   Current exercise: some walking Weight trend: stable Fatigue out of ordinary? Yes Seat belt? Yes.   Advanced directive? Yes  Health maintenance Shingrix- Yes Colonoscopy- Yes Tetanus- Due Hep C- Yes Pneumonia vaccine- Yes  Hypertension Patient presents for hypertension follow up. He does monitor home blood pressures. Blood pressures ranging on average from 140-150's/60's. He is compliant with medications- losartan 50 mg/d, Norvasc 10 mg/d, hydralazine 100 mg TID. Patient has these side effects of medication: none Diet/exercise as above.   Past Medical History:  Diagnosis Date   Abdominal aortic atherosclerosis (HCC)    Anemia    likely of chronic disease   Angina pectoris (HCC) 08/30/2020   Arthritis    rheumatoid   Atrial fibrillation (HCC)    Atrial fibrillation with rapid ventricular response (HCC) 05/12/2022   06/20/23 - pt states he's not had any A-fib   Barrett's esophagus without dysplasia 03/25/2019   Formatting of this note might be different from the original. EGD done in 02/2019. Due in 3 years.   Benign prostatic hyperplasia with nocturia 12/29/2018   Bipolar 1 disorder (HCC)    Bipolar 1 disorder, mixed, moderate (HCC) 08/16/2014   Bipolar 2 disorder (HCC) 08/16/2014   Bipolar affective disorder, currently depressed, moderate (HCC) 03/08/2020   Bipolar depression (HCC) 08/16/2014   BPH with urinary obstruction 12/29/2018   Brain ischemia 03/08/2020   Cardiac murmur 08/30/2020   Chest pain 09/06/2020   Chicken pox    Chronic bronchitis (HCC)    Chronic kidney disease, stage 3a (HCC) 07/21/2019   Cognitive complaints 03/08/2020   Coronary artery disease involving native coronary artery of native heart 09/06/2020   Coronary artery disease  involving native coronary artery of native heart with angina pectoris (HCC) 09/06/2020   Depression    Disc degeneration, 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 Last Assessment & Plan:  Well controlled. Continue current medications.   GERD without esophagitis 08/16/2014   Formatting of this note might be different from the original. Last Assessment & Plan:  Well controlled. Continue current medications.   Herpes gingivostomatitis 02/10/2015   High risk medication use 03/23/2018   History of colon polyps 03/25/2019   Formatting of this note might be different from the original. tubular adenoma   History of stroke 11/10/2019   Hyperlipidemia    Hyperphosphatemia 10/30/2022   Hypertension    Ingrowing nail 03/12/2020   Intention tremor 06/16/2019   Labile hypertension 08/16/2014   Formatting of this note might be different from the original. Last Assessment & Plan:  Slightly above goal today secondary to pain. Will continue current regimen. Will check CMP today.   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 Concern over possible lichen planus. His dentist noticed lesions of his oral mucosa.  It was felt to be lichen planus.  He presents to discuss further.  Rarely does the area hurt.He smoked in  the distant past EXAM shows several white patches on the buccal mucosa bilaterally.  No other concerning oral mucosal les   Lithium use 10/30/2022   Long-term use of immunosuppressant medication 03/23/2018   Low testosterone 09/17/2016   Midline thoracic back pain 02/10/2015   Mixed hyperlipidemia 08/16/2014   Myocarditis (HCC) 05/14/2022    Normochromic normocytic anemia 08/16/2014   Normocytic anemia    likely of chronic disease   PAD (peripheral artery disease) (HCC) 03/23/2019   Pain of right hip joint 01/19/2016   Pericarditis 05/12/2022   Rheumatic fever    Rheumatoid arthritis (HCC) 10/29/2018   Formatting of this note might be different from the original. Rheum:  Dr. Sharmon Revere Formerly on MTX - stopped due to mouth sores. Currently started on Arava.  Also on 5 mg prednisone x 3 months during run-in.   Right-sided chest wall pain 04/17/2019   Seasonal allergies 03/23/2018   Sepsis (HCC) 05/11/2022   Stasis dermatitis of both legs 07/29/2022   Statin intolerance 07/31/2020   Stroke (HCC)    3 - TIA's   Task-specific dystonia of hand    right hand   Thoracic radiculopathy 06/16/2019   Vitamin B12 deficiency    Injections   Vitamin D deficiency 09/17/2016     Past Surgical History:  Procedure Laterality Date   CLAVICLE SURGERY     Right, Hardware placed   CORONARY STENT INTERVENTION N/A 09/06/2020   Procedure: CORONARY STENT INTERVENTION;  Surgeon: Tonny Bollman, MD;  [facility] INVASIVE CV LAB;  Service: Cardiovascular;  Laterality: N/A;   FALSE ANEURYSM REPAIR Right 06/23/2023   Procedure: RIGHT ULNAR ARTERY PSEUDOANEURYSM REPAIR;  Surgeon: [name], MD;  Location: [facility] OR;  Service: Vascular;  Laterality: Right;   ingrown toenail Bilateral    great toes   LEFT HEART CATH AND CORONARY ANGIOGRAPHY N/A 09/06/2020   Procedure: LEFT HEART CATH AND CORONARY ANGIOGRAPHY;  Surgeon: Tonny Bollman, MD;  Location: [facility] INVASIVE CV LAB;  Service: Cardiovascular;  Laterality: N/A;   LEFT HEART CATH AND CORONARY ANGIOGRAPHY N/A 06/06/2023   Procedure: LEFT HEART CATH AND CORONARY ANGIOGRAPHY;  Surgeon: Marykay Lex, MD;  Location[name] INVASIVE CV LAB;  Service: Cardiovascular;  Laterality: N/A;   WISDOM TOOTH EXTRACTION      Medications  Current Outpatient Medications on File Prior to Visit  Medication Sig  Dispense Refill   acetaminophen (TYLENOL) 650 MG CR tablet Take 1,300 mg by mouth every 8 (eight) hours as needed for pain.     amLODipine (NORVASC) 5 MG tablet Take 2 tablets (10 mg total) by mouth daily. 30 tablet 3   aspirin EC 81 MG tablet Take 1 tablet (81 mg total) by mouth daily. Swallow whole. 30 tablet 12   cephALEXin (KEFLEX) 500 MG capsule Take 1 capsule (500 mg total) by mouth 2 (two) times daily. 14 capsule 0   Cholecalciferol 50 MCG (2000 UT) CAPS Take 2,000 Units by mouth daily.      Coenzyme Q10 (COQ10) 200 MG CAPS Take 200 mg by mouth daily.     diclofenac Sodium (VOLTAREN) 1 % GEL Apply 1 application  topically daily as needed for pain.     ezetimibe (ZETIA) 10 MG tablet Take 1 tablet (10 mg total) by mouth daily. 90 tablet 1   finasteride (PROSCAR) 5 MG tablet Take 1 tablet (5 mg total) by mouth daily. 90 tablet 1   hydrALAZINE (APRESOLINE) 100 MG tablet Take 100 mg by mouth 3 (three) times daily.  leflunomide (ARAVA) 20 MG tablet TAKE 1 TABLET BY MOUTH DAILY 90 tablet 0   lithium carbonate 150 MG capsule Take 3 capsules (450 mg total) by mouth daily. 270 capsule 1   losartan (COZAAR) 100 MG tablet Take 100 mg by mouth daily.     Lysine HCl 1000 MG TABS Take 1,000 mg by mouth 2 (two) times daily.      nitroGLYCERIN (NITROSTAT) 0.4 MG SL tablet Place 1 tablet (0.4 mg total) under the tongue every 5 (five) minutes as needed for chest pain. 25 tablet 4   omeprazole (PRILOSEC) 40 MG capsule Take 1 capsule (40 mg total) by mouth in the morning and at bedtime. 180 capsule 3   predniSONE (DELTASONE) 5 MG tablet Take 1 tablet (5 mg total) by mouth daily as needed. 30 tablet 2   simvastatin (ZOCOR) 5 MG tablet TAKE 1 TABLET BY MOUTH DAILY 90 tablet 2   triamcinolone cream (KENALOG) 0.1 % Apply 1 Application topically 2 (two) times daily. 30 g 0   vitamin B-12 (CYANOCOBALAMIN) 1000 MCG tablet Take 1,000 mcg by mouth daily.     Allergies Allergies  Allergen Reactions    Methotrexate Dermatitis    Developed Mouth Sores   Nsaids Dermatitis    Developed Mouth Blisters   Plaquenil [Hydroxychloroquine] Other (See Comments)    Terrible Nightmares   Azulfidine [Sulfasalazine] Nausea Only   Crestor [Rosuvastatin] Other (See Comments)    Caused abdominal pain that radiated to the back   Iodinated Contrast Media     GI Intolerance, Made him severely sick   Isosorbide Other (See Comments)    Made him feel weak   Mevacor [Lovastatin] Other (See Comments)    Caused abdominal pain that radiated to the back   Pravachol [Pravastatin] Other (See Comments)    Caused abdominal pain that radiated to the back   Ranexa [Ranolazine Er] Nausea Only    Severe nausea    Family History Family History  Problem Relation Age of Onset   Alzheimer's disease Mother 69       Deceased in early 35s   Stroke Father 74       Deceased   Hypertension Father    Alcoholism Father    Alcoholism Brother    Heart disease Maternal Grandfather 30       Deceased   Heart disease Paternal Grandfather 31       Deceased   Hypertension Son     Review of Systems: Constitutional:  no fevers Eye:  no recent significant change in vision Ears:  No changes in hearing Nose/Mouth/Throat:  no complaints of nasal congestion, no sore throat Cardiovascular: no chest pain Respiratory:  No shortness of breath Gastrointestinal:  No change in bowel habits GU:  No frequency Integumentary:  no abnormal skin lesions reported Neurologic:  no headaches Endocrine:  denies unexplained weight changes  Exam BP (!) 142/76 (BP Location: Left Arm, Cuff Size: Normal)   Pulse 69   Temp 97.9 F (36.6 C) (Oral)   Resp 16   Ht 5\' 7"  (1.702 m)   Wt 162 lb 3.2 oz (73.6 kg)   SpO2 99%   BMI 25.40 kg/m  General:  well developed, well nourished, in no apparent distress Skin:  no significant moles, warts, or growths Head:  no masses, lesions, or tenderness Eyes:  pupils equal and round, sclera anicteric  without injection Ears:  canals without lesions, TMs shiny without retraction, no obvious effusion, no erythema Nose:  nares patent,  mucosa normal Throat/Pharynx:  lips and gingiva without lesion; tongue and uvula midline; non-inflamed pharynx; no exudates or postnasal drainage Lungs:  clear to auscultation, breath sounds equal bilaterally, no respiratory distress Cardio:  regular rate and rhythm, no LE edema or bruits Rectal: Deferred GI: BS+, S, NT, ND, no masses or organomegaly Musculoskeletal:  symmetrical muscle groups noted without atrophy or deformity Neuro:  gait normal; deep tendon reflexes normal and symmetric Psych: well oriented with normal range of affect and appropriate judgment/insight  Assessment and Plan  Well adult exam  Essential hypertension - Plan: Hepatic function panel, Lipid panel, olmesartan (BENICAR) 40 MG tablet, amLODipine (NORVASC) 10 MG tablet   Well 72 y.o. male. Counseled on diet and exercise. Tdap rec'd to get at the pharmacy.  HTN: Chronic, unstable. Stop losartan 50 mg/d, change to olmesartan 40 mg/d. Cont Norvasc 10 mg/d, hydralazine 100 mg TID.  Other orders as above. Follow up in 6 mo.  The patient voiced understanding and agreement to the plan.  Jilda Roche [place], DO 09/22/23 10:25 AM

## 2023-09-30 ENCOUNTER — Encounter: Payer: Self-pay | Admitting: Internal Medicine

## 2023-09-30 ENCOUNTER — Ambulatory Visit: Payer: PPO | Attending: Internal Medicine | Admitting: Internal Medicine

## 2023-09-30 VITALS — BP 136/66 | HR 54 | Resp 16 | Ht 67.0 in | Wt 164.0 lb

## 2023-09-30 DIAGNOSIS — I309 Acute pericarditis, unspecified: Secondary | ICD-10-CM | POA: Diagnosis not present

## 2023-09-30 DIAGNOSIS — Z7952 Long term (current) use of systemic steroids: Secondary | ICD-10-CM | POA: Diagnosis not present

## 2023-09-30 DIAGNOSIS — M65332 Trigger finger, left middle finger: Secondary | ICD-10-CM

## 2023-09-30 DIAGNOSIS — M069 Rheumatoid arthritis, unspecified: Secondary | ICD-10-CM

## 2023-09-30 DIAGNOSIS — Z79899 Other long term (current) drug therapy: Secondary | ICD-10-CM | POA: Diagnosis not present

## 2023-10-01 LAB — SEDIMENTATION RATE: Sed Rate: 6 mm/h (ref 0–20)

## 2023-10-01 LAB — C-REACTIVE PROTEIN: CRP: <3 mg/L (ref <8.0)

## 2023-10-22 ENCOUNTER — Other Ambulatory Visit: Payer: Self-pay | Admitting: Internal Medicine

## 2023-10-22 DIAGNOSIS — M069 Rheumatoid arthritis, unspecified: Secondary | ICD-10-CM

## 2023-10-22 DIAGNOSIS — N1832 Chronic kidney disease, stage 3b: Secondary | ICD-10-CM | POA: Diagnosis not present

## 2023-10-22 DIAGNOSIS — R0989 Other specified symptoms and signs involving the circulatory and respiratory systems: Secondary | ICD-10-CM | POA: Diagnosis not present

## 2023-10-22 NOTE — Telephone Encounter (Signed)
Last Fill: 07/25/2023  Labs: 09/20/2023 RBC 3.75, Hgb 11.2, Hct 34.6, Lymphs 0.3, Glucose 110, Creat. 1.36, GFR 56  Next Visit: 12/31/2023  Last Visit: 09/30/2023  DX: Rheumatoid arthritis involving multiple sites, unspecified whether rheumatoid factor present   Current Dose per office note 09/30/2023: leflunomide 20 mg daily   Okay to refill Arava ?

## 2023-10-22 NOTE — Progress Notes (Signed)
Sedimentation rate and CRP were normal no evidence of increased systemic inflammation.

## 2023-12-17 ENCOUNTER — Ambulatory Visit: Payer: PPO | Attending: Cardiology | Admitting: Cardiology

## 2023-12-17 ENCOUNTER — Telehealth: Payer: Self-pay | Admitting: Cardiology

## 2023-12-17 VITALS — BP 140/60 | HR 60 | Ht 67.0 in | Wt 167.0 lb

## 2023-12-17 DIAGNOSIS — I25119 Atherosclerotic heart disease of native coronary artery with unspecified angina pectoris: Secondary | ICD-10-CM | POA: Diagnosis not present

## 2023-12-17 DIAGNOSIS — I10 Essential (primary) hypertension: Secondary | ICD-10-CM

## 2023-12-17 DIAGNOSIS — I872 Venous insufficiency (chronic) (peripheral): Secondary | ICD-10-CM

## 2023-12-17 DIAGNOSIS — I729 Aneurysm of unspecified site: Secondary | ICD-10-CM | POA: Diagnosis not present

## 2023-12-17 DIAGNOSIS — R5382 Chronic fatigue, unspecified: Secondary | ICD-10-CM | POA: Diagnosis not present

## 2023-12-17 DIAGNOSIS — Z789 Other specified health status: Secondary | ICD-10-CM

## 2023-12-17 DIAGNOSIS — I4891 Unspecified atrial fibrillation: Secondary | ICD-10-CM | POA: Diagnosis not present

## 2023-12-17 DIAGNOSIS — E785 Hyperlipidemia, unspecified: Secondary | ICD-10-CM | POA: Diagnosis not present

## 2023-12-17 DIAGNOSIS — I514 Myocarditis, unspecified: Secondary | ICD-10-CM | POA: Diagnosis not present

## 2023-12-17 DIAGNOSIS — Z8673 Personal history of transient ischemic attack (TIA), and cerebral infarction without residual deficits: Secondary | ICD-10-CM

## 2023-12-17 MED ORDER — ISOSORBIDE MONONITRATE ER 30 MG PO TB24: 30.0000 mg | ORAL_TABLET | Freq: Every day | ORAL | 6 refills | Status: DC

## 2023-12-17 NOTE — Telephone Encounter (Signed)
 Pt c/o medication issue:  1. Name of Medication:   isosorbide  mononitrate (IMDUR ) 30 MG 24 hr tablet    2. How are you currently taking this medication (dosage and times per day)? no  3. Are you having a reaction (difficulty breathing--STAT)? no  4. What is your medication issue? Pt is allergic per pharmacy, need alternative

## 2023-12-17 NOTE — Patient Instructions (Addendum)
 Medication Instructions:    Start taking  Isosorbide  Mononitrate ( Imdur ) 30 mg  at bedtime   - the first  2 weeks take  1/2 tablet( 15 mg)  at bedtime instead ( 30 mg)   Start taking daily Aspirin  81 mg  30 minutes prior to the Isosorbide      *If you need a refill on your cardiac medications before your next appointment, please call your pharmacy*   Lab Work: Not needed    Testing/Procedures: Not needed   Follow-Up: At [facility], you and your health needs are our priority.  As part of our continuing mission to provide you with exceptional heart care, we have created designated Provider Care Teams.  These Care Teams include your primary Cardiologist (physician) and Advanced Practice Providers (APPs -  Physician Assistants and Nurse Practitioners) who all work together to provide you with the care you need, when you need it.     Your next appointment:   2 month(s)  The format for your next appointment:   In Person  Provider:   Scot Ford, PA-C    Then, Alm Clay, MD will plan to see you again in 6 month(s).( July 2025)    Other Instructions

## 2023-12-17 NOTE — Progress Notes (Signed)
 Cardiology Office Note:  .   Date:  12/21/2023  ID:  Tanda JONELLE Leek, DOB 1951-09-11, MRN [id number] PCP: Frann Mabel Mt, DO  [facility] Providers Cardiologist:  Alm Clay, MD Electrophysiologist:  Elspeth Sage, MD  APP: Scot Ford   Chief Complaint  Patient presents with   Follow-up   Coronary Artery Disease    Still has ongoing chest pain despite cardiac catheterization showing widely patent stent in the PDA.    Patient Profile: .     Noah Grant is a  73 y.o. male  with a PMH notable for CAD-PCI with ongoing CP who presents here for 72-month follow-up at the request of Frann Mabel Mt*.  He had previously been followed by Dr. Revankar, now switching care to to see me.  CAD (08/28/2020) DES PCI to the RCA x 2 Patent Stent Cath 05/2023 PAF - no Sx of recurrent Sx. Associated with Myocarditis History of myopericarditis diagnosed by cardiac MRI 05/2022 CKD-3 History of TIA/CVA HTN HLD RA Bipolar-1    Noah Grant was last seen on August 20, 2023 by Scot Ford, PA as a second follow-up.  He has been seen in July by Mr. 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  He eventually was seen by Dr. Silver from vascular surgery who proceeded with repair.  His wrist is healed.  Was noting occasional sharp chest pain lasting less than a minute.  Chronic.  Ongoing.  Had not yet restarted Eliquis  and wanted to discontinue.  This is despite understanding the risks of stroke with A-fib.  Since he had not had any further episodes since his myocarditis, they decided to switch to aspirin .  Had been referred to Dr. Garnette Sage for bradycardia by his rheumatologist and nephrologist.  Eventually beta-blocker dose was reduced with improvement in heart rate.  Was finally discontinued.  Subjective  Discussed the use of AI scribe software for clinical note transcription with the patient, who gave verbal consent to proceed.  History of Present Illness   The  patient, with a history of coronary artery disease and rheumatoid arthritis, presented for a follow-up - transferring care to me after previously being followed by Dr. Edwyna in our [place] office.  He does report intermittent chest pain, which occurs more frequently at rest than with exertion. The discomfort is sometimes relieved with one dose of nitroglycerin , but at times requires up to three doses for relief.  His most recent cardiac catheterization was complicated by Right Ulnar artery pseudoaneurysm - requiring surgical repair by Dr. Lanis.   This catheterization showed patent stents with otherwise non-obstructive disease.   The patient also reports significant fatigue, which has been ongoing for approximately ten years and has worsened over the last few years. The fatigue is so severe that the patient often needs to rest after performing simple daily activities such as showering or eating breakfast.  The patient has a history of hypertension, which is currently managed with hydralazine , amlodipine , and olmesartan . Despite medication adjustments, the patient's blood pressure remains elevated.    The patient's cholesterol is managed with simvastatin , Zetia , and CoQ10, and recent lab results in October showed excellent control with an LDL of 50. The patient had a stent placed in 2021, which was found to be wide open during a recent catheterization.  The patient also reported occasional swelling in the lower legs and aching feet, particularly at night. The patient denied any shortness of breath, blood in stools or urine, or pain in  the calves or thighs when walking. The patient did report occasional dizziness, typically in the late evening or mornings.   The patient also has a history of bladder infection (with hematuria), which was treated with antibiotics a couple of months prior to the consultation.      ROS:  Review of Systems - Negative except symptoms noted above    Objective    Current Meds  Medication Sig   acetaminophen  (TYLENOL ) 650 MG CR tablet Take 1,300 mg by mouth every 8 (eight) hours as needed for pain.   amLODipine  (NORVASC ) 10 MG tablet Take 1 tablet (10 mg total) by mouth daily.   aspirin  EC 81 MG tablet Take 1 tablet (81 mg total) by mouth daily. Swallow whole.   cephALEXin  (KEFLEX ) 500 MG capsule Take 1 capsule (500 mg total) by mouth 2 (two) times daily.   Cholecalciferol 50 MCG (2000 UT) CAPS Take 2,000 Units by mouth daily.   Coenzyme Q10 (COQ10) 200 MG CAPS Take 200 mg by mouth daily.   diclofenac  Sodium (VOLTAREN ) 1 % GEL Apply 1 application  topically daily as needed for pain.   ezetimibe  (ZETIA ) 10 MG tablet Take 1 tablet (10 mg total) by mouth daily.   finasteride  (PROSCAR ) 5 MG tablet Take 1 tablet (5 mg total) by mouth daily.   hydrALAZINE  (APRESOLINE ) 100 MG tablet Take 100 mg by mouth 3 (three) times daily.   isosorbide  mononitrate (IMDUR ) 30 MG 24 hr tablet Take 1 tablet (30 mg total) by mouth at bedtime. Take 81 mg Aspirin   30 min prior to taking.   leflunomide  (ARAVA ) 20 MG tablet TAKE 1 TABLET BY MOUTH DAILY   lithium  carbonate 150 MG capsule Take 3 capsules (450 mg total) by mouth daily.   Lysine  HCl 1000 MG TABS Take 1,000 mg by mouth 2 (two) times daily.    nitroGLYCERIN  (NITROSTAT ) 0.4 MG SL tablet Place 1 tablet (0.4 mg total) under the tongue every 5 (five) minutes as needed for chest pain.   olmesartan  (BENICAR ) 40 MG tablet Take 1 tablet (40 mg total) by mouth daily.   omeprazole  (PRILOSEC) 40 MG capsule Take 1 capsule (40 mg total) by mouth in the morning and at bedtime.   predniSONE  (DELTASONE ) 5 MG tablet Take 1 tablet (5 mg total) by mouth daily as needed.   simvastatin  (ZOCOR ) 5 MG tablet TAKE 1 TABLET BY MOUTH DAILY   triamcinolone  cream (KENALOG ) 0.1 % Apply 1 Application topically 2 (two) times daily.   vitamin B-12 (CYANOCOBALAMIN ) 1000 MCG tablet Take 1,000 mcg by mouth daily.    Studies Reviewed: .         Echocardiogram 01/02/2023:  EF 60 to 65%, no regional wall motion abnormality, moderate LVH, mild MR.  Heart Monitor in February 2024 demonstrated minimal heart rate 36, maximal heart rate 120, average heart rate 56, less than 1% PVCs.  CATH 05/29/2023:Stable findings on angiography with widely patent PDA stent.  Dominance: Right      Cardiac MRI 05/13/2022: Findings suggestive of acute myocarditis with elevated myocardial T2 values in the basal inferolateral mid wall with late gadolinium enhancement.  This is consistent with scar pattern seen in pericarditis.  Normal LV size and function with EF 63%.  Normal RV function with EF 58%.  Moderate left and small right pleural effusions.  Risk Assessment/Calculations:    CHA2DS2-VASc Score = 5   This indicates a 7.2% annual risk of stroke. The patient's score is based upon: CHF History: 0 HTN History:  1 Diabetes History: 0 Stroke History: 2 Vascular Disease History: 1 Age Score: 1 Gender Score: 0   Episode of A-fib occurred with myocarditis and has not had any further episodes.  Due to patient preference, not on anticoagulation.    Physical Exam:   VS:  BP (!) 140/60 (BP Location: Left Arm, Patient Position: Sitting, Cuff Size: Normal)   Pulse 60   Ht 5' 7 (1.702 m)   Wt 167 lb (75.8 kg)   SpO2 98%   BMI 26.16 kg/m    Wt Readings from Last 3 Encounters:  12/17/23 167 lb (75.8 kg)  09/30/23 164 lb (74.4 kg)  09/22/23 162 lb 3.2 oz (73.6 kg)    GEN: Well nourished, well developed in no acute distress; healthy-appearing NECK: No JVD; No carotid bruits CARDIAC: Normal S1, S2; RRR, no murmurs, rubs, gallops RESPIRATORY:  Clear to auscultation without rales, wheezing or rhonchi ; nonlabored, good air movement. ABDOMEN: Soft, non-tender, non-distended EXTREMITIES:  No edema; No deformity ; left ulnar scar, well-healed.  Normal pulse.    ASSESSMENT AND PLAN: .    Problem List Items Addressed This Visit       Cardiology Problems    Atrial fibrillation (HCC) (Chronic)   A-fib associated with myocarditis with no further episodes. Not on beta-blocker because of fatigue and bradycardia-this was stopped by Dr. Fernande.  Not anticoagulation with no recurrent episodes.  This is per patient preference and long discussions elsewhere.      Relevant Medications   isosorbide  mononitrate (IMDUR ) 30 MG 24 hr tablet   Coronary artery disease involving native coronary artery of native heart with angina pectoris (HCC) - Primary (Chronic)   Stent placed in 2021 remains patent. Intermittent chest pain at rest, relieved by nitroglycerin . No chest pain with exertion. - Patent stent noted on last cardiac catheterization -Continue current regimen of low-dose Simvastatin , Zetia , and CoQ10. -Add Imdur  30mg  at night, starting with a half tablet, to be taken 30 minutes after Aspirin . (listed as intolerance - Sx of fatigue - but that has not changed since stopping- will Re-Challenge) - Already on Amlodipine  at 10 mg; with resting HR of 60 and fatigue, Beta Blockers are not a good option for antianginal Rx. - I question whether or not the chest pain is truly cardiac in nature vs. related to RA      Relevant Medications   isosorbide  mononitrate (IMDUR ) 30 MG 24 hr tablet   Essential hypertension (Chronic)   Despite increased doses of Hydralazine , Amlodipine , and Olmesartan , blood pressure remains elevated. -Continue current regimen. -Add Imdur  30mg  at night, which may also aid in blood pressure control.      Relevant Medications   isosorbide  mononitrate (IMDUR ) 30 MG 24 hr tablet   Hyperlipidemia with target low density lipoprotein (LDL) cholesterol less than 55 mg/dL (Chronic)   Labs from October 2024 showed total cholesterol 121, HDL 31, LDL 50 and triglycerides 197.  Well-controlled on current dose of statin and Zetia .  He is only on 5 mg simvastatin  10 Zetia  and co-Q10, seems to tolerating relatively well.  Would not titrate statin further  because of fatigue.      Relevant Medications   isosorbide  mononitrate (IMDUR ) 30 MG 24 hr tablet   Myocarditis (HCC) (Chronic)   History of myocarditis over 18 months ago with no further episodes.  This was complicated by A-fib but has not had any breakthrough spells of A-fib or further concerning symptoms.      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  Well-healed. Radial access was not used because of inability to advance the catheter in the previous catheterizations.  In the future would probably require left wrist access.      Relevant Medications   isosorbide  mononitrate (IMDUR ) 30 MG 24 hr tablet   Stasis dermatitis of both legs (Chronic)   Lower extremity edema Intermittent, likely related to Amlodipine  and [employer] use. -Continue current regimen. -Advise patient to elevate legs and perform pedal motion exercises when sitting for long periods.      Relevant Medications   isosorbide  mononitrate (IMDUR ) 30 MG 24 hr tablet     Other   Chronic fatigue (Chronic)   Chronic, worsening over the past few years. Likely related to Rheumatoid Arthritis rather than cardiac issues. -No changes to current management. No notable change since stopping beta-blocker.      History of stroke (Chronic)   On aggressive CV risk factor modification.  Remains on aspirin , statin and aggressive BP control.      Statin intolerance (Chronic)   Intolerant of higher dose of statin if things are doing okay on 5 mg simvastatin  plus co-Q10 and Zetia .       Follow-Up: Return in about 2 months (around 02/14/2024) for Routine Follow-up after testing ~ 1-2 months, Follow-up with APP, 6 month follow-up with me. --> APP evaluation in a couple of months to assess response to Imdur . -Regular follow-up with PCP and Nephrologist. -Return in July for routine  cardiac follow-up.   I spent 48 minutes in the care of Tanda JONELLE Leek today including reviewing labs (2 min), reviewing studies (8 min - cath images reviewed), face to face time discussing treatment options ( ), reviewing records from former cardiologist (2 min), and documenting in the encounter. - 6 min dictating = 48 min      Signed, Alm MICAEL Clay, MD, MS Alm Clay, M.D., M.S. Interventional Cardiologist  [facility]  Pager # [phone] Phone # [phone] [address] [place], [place] [address]

## 2023-12-17 NOTE — Progress Notes (Signed)
Office Visit Note  Patient: Noah Grant             Date of Birth: July 02, 1951           MRN: [id number]             PCP: Sharlene Dory, DO Referring: Sharlene Dory* Visit Date: 12/31/2023   Subjective:  Follow-up (Patient states his fingers sometimes turn blue and go numb. Patient states it started about a month ago. Patient states the tips of his fingers stay numb and tingling all the time. Patient states he is losing the use of his hands. )    Discussed the use of AI scribe software for clinical note transcription with the patient, who gave verbal consent to proceed.  History of Present Illness   Noah Grant is a 73 y.o. 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 daily and prednisone 5 mg daily as needed.  He presents with worsening pain and discomfort in multiple areas. They report severe foot pain, particularly at night, and increasing hip pain that radiates into the leg muscles. The patient also notes worsening 'duck feet,' with their feet turning outward more than before.  The patient's hands have been turning blue several times a day, primarily under the fingernails, and the tips of their fingers are numb and tingly. They report a loss of hand function, with difficulty picking up and holding items. Their primary care provider suggested this might be Raynaud's disease.  The patient also reports persistent shoulder pain. Despite these issues, the patient states they feel "really good" overall, except for the aforementioned symptoms.  The patient's medications include amlodipine, olmesartan, and hydralazine for their cardiovascular condition. They also take leflunomide and prednisone for rheumatoid arthritis, and occasionally use over-the-counter Tylenol for pain.  The patient has a history of atrial fibrillation but has not had any clots or recurrence of the condition since their hospitalization for myocarditis. They were  previously on Eliquis and Plavix, but are currently on low-dose aspirin.  The patient also has a hiatal hernia and Barrett's disease, and has been struggling with severe acid reflux despite taking omeprazole twice daily. They are due to start on famotidine in addition to the omeprazole.  The patient has not been on any muscle relaxants or neuropathy medications recently, but has previously taken gabapentin, which they stopped due to sedation. They also report a reaction to isosorbide, which caused lethargy and joint pain.  The patient's blood pressure has stabilized with their current medication regimen. They have not been sick recently, but had a bladder infection treated with antibiotics about five to six months ago. They are due to see a gastroenterologist next month for their hiatal hernia and Barrett's disease.     Previous HPI 09/30/2023 Noah Grant is a 73 y.o. 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 daily and prednisone 5 mg daily as needed.  Continues to experience daily joint pain and stiffness worst complaints in his hands and hips.  He feels there is an increase in hand swelling since the last visit.  Addition of 5 mg prednisone he does not feel made a very big difference and is only taking occasionally.  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  In his left hand experiences pain near the base of the third finger and frequently has pain and getting stuck first thing in the morning. Besides arthritis  he complains of generalized fatigue and he has to pause after short periods of exertion although he does not feel any specific change in breathing.  He still having frequent chest pain taking oral nitrates up to twice daily.   Previous HPI 06/30/2023 Noah Grant is a 73 y.o. 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 daily since our last visit still has a lot of joint pain limiting his  activities uses a cane for support which decreases leg pain.  Still has a lot of shoulder pain especially with trying to reach overhead or behind his back and bilateral hand pain especially in the MCPs.  No specific flareup or exacerbation of swelling.  He did have heart catheterization through right ulnar artery that was complicated by pseudoaneurysm had subsequent surgical repair for this.  Lateral shoulder and hip pain bothers him especially if he lies to his side at night.     Previous HPI 03/31/2023 Noah Grant is a 73 y.o. 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 daily we switch medications after last visit.  So far has not seen any significant difference in joint symptoms for better or worse.  He is tolerating the medication without noticeable side effect.  He had follow-up with his PCP office and discussed bilateral foot pain that was apparently unclear if related to peripheral neuropathy or from his rheumatoid arthritis.  Pain is mostly in the anterior half of the foot more severe along the top and across MTPs particularly bad early in the day when weightbearing or with direct pressure.  His mobility is also limited with bilateral hip pain.  Hurts worse on the side of the hips gets much worse after prolonged walking also gets pain if lying on either side in bed from direct pressure.   Previous HPI 01/29/23 Noah Grant is a 73 y.o. male here for follow up for seropositive RA on Actemra 162 mg subcu q. 14 days.  Overall he is feels very poorly like his arthritis is not benefiting at all from the treatment and mobility is worse than a few months ago.  He stopped some cardiac medications with improvement in his dizziness and leg swelling and fatigue.  Recent 2D echo look normal with no evidence of effusion or reduced ejection fraction from previous myocarditis episode.  He has ongoing bilateral shoulder pain with stiffness that remains throughout the entire day.  He  is using a cane for offloading due to pain affecting hips and feet on both sides worse while walking.  He is seeing some diffuse swelling throughout the hands this appears worse at the end of the day does not localize to any particular joints.  Does cause difficulty fully closing his grip.   Previous HPI 10/29/22 TERRIAN DURYEE is a 73 y.o. 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  He completed the colchicine and is now on just the Actemra treatment.  He is feeling very fatigued limiting his overall activity level.  He is also been experiencing some additional pain and stiffness in multiple areas.  He had increased swelling in both legs accumulating towards the feet and ankles.  He has had adjustment to beta blocker treatment currently prescribed metoprolol XR 25 mg daily. Bradycardia and heart rate improved now often in the 50s did not notice any difference in the peripheral swelling after the medicine change. He is  also still on norvasc and hydralazine for blood pressure control.   Previous HPI 05/30/22 KAYLON VOLKER is a 73 y.o. male here for follow up for RA he was on leflunomide recently hospitalized with sepsis and pericarditis without clear underlying infection identified.  Symptoms started with chills developed increasing swelling in his hands and feet with joint pain that was worse in the shoulders and hips.  He developed severe chest pain felt a constriction or tightness around the heart.  The chest pain was sharp did report provocation with deep breaths or lying in left or right positions.  Evaluation was consistent with pericarditis with effusion.  He was discharged on colchicine and received short term steroids and the chest pain is currently doing well.  The flareup of joint pain has also improved with the oral anti-inflammatory medication.   Previous HPI 04/19/22 JENNIS BOTTE is a 73 y.o. male here for rheumatoid arthritis  previously seeing Dr. Sharmon Revere and on treatment with leflunomide.  Symptoms started a long time ago gradual onset but has been seeing trucks for treatment of this in the past decade or so.  He has joint pain and stiffness involving multiple areas.  Shoulders, wrists, hands, also bilateral hip pains.  Most commonly sees visible swelling or inflammation in his hands.  He has morning stiffness very severe for 30 minutes to an hour but keeps persistent joint stiffness throughout the day.  Also has pain increased when trying to lie still at night.  He has tried a number of treatments for this over multiple years.  He did not tolerate hydroxychloroquine due to confusion, MTX not tolerated, leflunomide leukopenia, and sulfasalazine mood disturbances.  He never experienced a significant improvement with Humira injection and developed acute conjunctivitis shortly after starting Remicade infusion.   DMARD Hx LEF current MTX GI intolerance HCQ Psychiatric side effects SSZ Psychiatric side effects Humira nonresponder Remicade nonresponder and conjunctivitis   12/2021 Cholesterol 107 TGs 193 HDL 28.9   09/2021 HBV neg HCV neg   Review of Systems  Constitutional:  Positive for fatigue.  HENT:  Negative for mouth sores and mouth dryness.   Eyes:  Negative for dryness.  Respiratory:  Positive for shortness of breath.   Cardiovascular:  Negative for chest pain and palpitations.  Gastrointestinal:  Positive for constipation. Negative for blood in stool and diarrhea.  Endocrine: Positive for increased urination.  Genitourinary:  Positive for involuntary urination.  Musculoskeletal:  Positive for joint pain, gait problem, joint pain, joint swelling, myalgias, muscle weakness, morning stiffness, muscle tenderness and myalgias.  Skin:  Positive for color change. Negative for rash, hair loss and sensitivity to sunlight.  Allergic/Immunologic: Negative for susceptible to infections.  Neurological:  Positive  for dizziness. Negative for headaches.  Hematological:  Negative for swollen glands.  Psychiatric/Behavioral:  Negative for depressed mood and sleep disturbance. The patient is not nervous/anxious.     PMFS History:  Patient Active Problem List   Diagnosis Date Noted   Chronic fatigue 12/21/2023   Trigger finger, left middle finger 09/30/2023   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   Hyperphosphatemia 10/30/2022   Lithium use 10/30/2022   Anemia 09/02/2022   Atrial fibrillation (HCC) 09/02/2022   Stasis dermatitis of both legs 07/29/2022   Myocarditis (HCC) 05/14/2022   Pericarditis 05/12/2022   Atrial fibrillation with rapid ventricular response (HCC) 05/12/2022   Sepsis (HCC) 05/11/2022   Elevated troponin level not due myocardial infarction 05/11/2022   Lactic acidosis 05/11/2022   Drug therapy 05/18/2021  Normocytic anemia    Arthritis    Bipolar 1 disorder (HCC)    Chicken pox    Chronic bronchitis (HCC)    Depression    Hyperlipidemia with target low density lipoprotein (LDL) cholesterol less than 55 mg/dL    Rheumatic fever    Essential hypertension    Chest pain 09/06/2020   Coronary artery disease involving native coronary artery of native heart with angina pectoris (HCC) 09/06/2020   Angina pectoris (HCC) 08/30/2020   Cardiac murmur 08/30/2020   Statin intolerance 07/31/2020   Abdominal aortic atherosclerosis (HCC)    Ingrowing nail 03/12/2020   Bipolar affective disorder, currently depressed, moderate (HCC) 03/08/2020   Brain ischemia 03/08/2020   Cognitive complaints 03/08/2020   History of stroke 11/10/2019   Chronic kidney disease, stage 3a (HCC) 07/21/2019   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   Thoracic radiculopathy 06/16/2019   Intention tremor 06/16/2019   Right-sided chest wall pain 04/17/2019   Barrett's esophagus without dysplasia 03/25/2019   History of colon polyps 03/25/2019   PAD (peripheral artery disease) (HCC) 03/23/2019   Benign  prostatic hyperplasia with nocturia 12/29/2018   BPH with urinary obstruction 12/29/2018   GAD (generalized anxiety disorder) 11/02/2018   Rheumatoid arthritis (HCC) 10/29/2018   Lichen planus 04/07/2018   High risk medication use 03/23/2018   Seasonal allergies 03/23/2018   Long-term use of immunosuppressant medication 03/23/2018   Low testosterone 09/17/2016   Vitamin B12 deficiency 09/17/2016   Vitamin D deficiency 09/17/2016   Disc degeneration, 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   Pain of right hip joint 01/19/2016   Herpes gingivostomatitis 02/10/2015   Midline thoracic back pain 02/10/2015   Normochromic normocytic anemia 08/16/2014   Bipolar 1 disorder, mixed, moderate (HCC) 08/16/2014   Gastroesophageal reflux disease 08/16/2014   Bipolar depression (HCC) 08/16/2014   GERD without esophagitis 08/16/2014   Bipolar 2 disorder (HCC) 08/16/2014    Past Medical History:  Diagnosis Date   Abdominal aortic atherosclerosis (HCC)    Anemia    likely of chronic disease   Angina pectoris (HCC) 08/30/2020   Arthritis    rheumatoid   Atrial fibrillation (HCC)    Atrial fibrillation with rapid ventricular response (HCC) 05/12/2022   06/20/23 - pt states he's not had any A-fib   Barrett's esophagus without dysplasia 03/25/2019   Formatting of this note might be different from the original. EGD done in 02/2019. Due in 3 years.   Benign prostatic hyperplasia with nocturia 12/29/2018   Bipolar 1 disorder (HCC)    Bipolar 1 disorder, mixed, moderate (HCC) 08/16/2014   Bipolar 2 disorder (HCC) 08/16/2014   Bipolar affective disorder, currently depressed, moderate (HCC) 03/08/2020   Bipolar depression (HCC) 08/16/2014   BPH with urinary obstruction 12/29/2018   Brain ischemia 03/08/2020   Cardiac murmur 08/30/2020   Chest pain 09/06/2020   Chicken pox    Chronic bronchitis (HCC)    Chronic kidney disease, stage 3a (HCC) 07/21/2019   Cognitive complaints 03/08/2020   Coronary artery disease  involving native coronary artery of native heart 09/06/2020   Coronary artery disease involving native coronary artery of native heart with angina pectoris (HCC) 09/06/2020   Depression    Disc degeneration, 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 Last Assessment & Plan:  Well controlled. Continue current medications.   GERD without esophagitis 08/16/2014   Formatting of this note might be different from the original. Last Assessment & Plan:  Well controlled. Continue current medications.   Herpes gingivostomatitis 02/10/2015   Hiatal hernia    High risk medication use 03/23/2018   History of colon polyps 03/25/2019   Formatting of this note might be different from the original. tubular adenoma   History of stroke 11/10/2019   Hyperlipidemia    Hyperphosphatemia 10/30/2022   Hypertension    Ingrowing nail 03/12/2020   Intention tremor 06/16/2019   Labile hypertension 08/16/2014   Formatting of this note might be different from the original. Last Assessment & Plan:  Slightly above goal today secondary to pain. Will continue current regimen. Will check CMP today.   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 Concern over possible lichen planus. His dentist noticed lesions of his oral mucosa.  It was felt to be lichen planus.  He presents to discuss further.  Rarely does the area hurt.He smoked in the distant past EXAM shows several white patches on the buccal mucosa bilaterally.  No other concerning oral mucosal les   Lithium use 10/30/2022   Long-term use of immunosuppressant medication 03/23/2018   Low testosterone 09/17/2016   Midline  thoracic back pain 02/10/2015   Mixed hyperlipidemia 08/16/2014   Myocarditis (HCC) 05/14/2022   Normochromic normocytic anemia 08/16/2014   Normocytic anemia    likely of chronic disease   PAD (peripheral artery disease) (HCC) 03/23/2019   Pain of right hip joint 01/19/2016   Pericarditis 05/12/2022   Rheumatic fever    Rheumatoid arthritis (HCC) 10/29/2018   Formatting of this note might be different from the original. Rheum:  Dr. Sharmon Revere Formerly on MTX - stopped due to mouth sores. Currently started on Arava.  Also on 5 mg prednisone x 3 months during run-in.   Right-sided chest wall pain 04/17/2019   Seasonal allergies 03/23/2018   Sepsis (HCC) 05/11/2022   Stasis dermatitis of both legs 07/29/2022   Statin intolerance 07/31/2020   Stroke (HCC)    3 - TIA's   Task-specific dystonia of hand    right hand   Thoracic radiculopathy 06/16/2019   Vitamin B12 deficiency    Injections   Vitamin D deficiency 09/17/2016    Family History  Problem Relation Age of Onset   Alzheimer's disease Mother 58       Deceased in early 58s   Stroke Father 71       Deceased   Hypertension Father    Alcoholism Father    Alcoholism Brother    Heart disease Maternal Grandfather 95       Deceased   Heart disease Paternal Grandfather 62       Deceased   Hypertension Son    Past Surgical History:  Procedure Laterality Date   CLAVICLE SURGERY     Right, Hardware placed   CORONARY STENT INTERVENTION N/A 09/06/2020   Procedure: CORONARY STENT INTERVENTION;  Surgeon: Tonny Bollman, MD;  Location[facility] INVASIVE CV LAB;  Service: Cardiovascular;  Laterality: N/A;   FALSE ANEURYSM REPAIR Right 06/23/2023   Procedure: RIGHT ULNAR ARTERY PSEUDOANEURYSM REPAIR;  Surgeon: [name], MD;  Location: [facility] OR;  Service: Vascular;  Laterality: Right;   ingrown toenail Bilateral    great toes   LEFT HEART CATH AND CORONARY ANGIOGRAPHY N/A  09/06/2020   Procedure: LEFT HEART CATH AND CORONARY  ANGIOGRAPHY;  Surgeon: Tonny Bollman, MD;  Location: [facility] INVASIVE CV LAB;  Service: Cardiovascular;  Laterality: N/A;   LEFT HEART CATH AND CORONARY ANGIOGRAPHY N/A 06/06/2023   Procedure: LEFT HEART CATH AND CORONARY ANGIOGRAPHY;  Surgeon: Marykay Lex, MD;  Location: [facility] INVASIVE CV LAB;  Service: Cardiovascular;  Laterality: N/A;   WISDOM TOOTH EXTRACTION     Social History   Social History Narrative   Not on file   Immunization History  Administered Date(s) Administered   Fluad Quad(high Dose 65+) 09/03/2021, 09/04/2022, 08/29/2023   Influenza, High Dose Seasonal PF 09/10/2019, 09/03/2020   Influenza,inj,Quad PF,6+ Mos 08/22/2014, 10/04/2015   Moderna SARS-COV2 Booster Vaccination 10/31/2020, 07/04/2021   Moderna Sars-Covid-2 Vaccination 01/17/2020, 02/13/2020   PNEUMOCOCCAL CONJUGATE-20 01/02/2022   Pfizer(Comirnaty)Fall Seasonal Vaccine 12 years and older 09/18/2023   Zoster Recombinant(Shingrix) 07/02/2017, 09/02/2017   Zoster, Live 08/17/2011     Objective: Vital Signs: BP 127/67 (BP Location: Left Arm, Patient Position: Sitting, Cuff Size: Large)   Pulse 62   Resp 14   Ht 5\' 7"  (1.702 m)   Wt 167 lb (75.8 kg)   BMI 26.16 kg/m    Physical Exam HENT:     Mouth/Throat:     Mouth: Mucous membranes are moist.     Pharynx: Oropharynx is clear.  Eyes:     Conjunctiva/sclera: Conjunctivae normal.  Cardiovascular:     Rate and Rhythm: Normal rate and regular rhythm.  Pulmonary:     Effort: Pulmonary effort is normal.     Breath sounds: Normal breath sounds.  Musculoskeletal:     Right lower leg: No edema.     Left lower leg: No edema.  Lymphadenopathy:     Cervical: No cervical adenopathy.  Skin:    General: Skin is warm and dry.     Comments: No digital pitting, telangiectasias, or skin lesions Normal appearing nailfold capillaries Normal modified Allen's test Capillary refill <2s  Neurological:     Mental Status: He is alert.  Psychiatric:        Mood  and Affect: Mood normal.      Musculoskeletal Exam:  Elbows full ROM no tenderness or swelling Wrists full ROM no tenderness or swelling Fingers full ROM, chronic MCP joint thickening no focal tenderness or palpable swelling Tenderness to pressure on low back at SI joints, lateral hip tenderness to pressure on both sides, tenderness to pressure and stiffness along IT band Knees full ROM no tenderness or swelling, more limited by muscle stiffness proximal   Investigation: No additional findings.  Imaging: No results found.  Recent Labs: Lab Results  Component Value Date   WBC 8.9 09/20/2023   HGB 11.2 (L) 09/20/2023   PLT 182 09/20/2023   NA 138 09/20/2023   K 3.7 09/20/2023   CL 106 09/20/2023   CO2 22 09/20/2023   GLUCOSE 110 (H) 09/20/2023   BUN 23 09/20/2023   CREATININE 1.36 (H) 09/20/2023   BILITOT 0.3 09/22/2023   ALKPHOS 66 09/22/2023   AST 15 09/22/2023   ALT 17 09/22/2023   PROT 6.8 09/22/2023   ALBUMIN 4.6 09/22/2023   CALCIUM 9.2 09/20/2023   GFRAA 75 11/28/2020   QFTBGOLDPLUS NEGATIVE 04/19/2022    Speciality Comments: No specialty comments available.  Procedures:  No procedures performed Allergies: Methotrexate, Nsaids, Plaquenil [hydroxychloroquine], Azulfidine [sulfasalazine], Crestor [rosuvastatin], Iodinated contrast media, Isosorbide, Mevacor [lovastatin], Pravachol [pravastatin], and Ranexa [ranolazine er]   Assessment / Plan:  Visit Diagnoses: Rheumatoid arthritis involving multiple sites, unspecified whether rheumatoid factor present (HCC) - Plan: leflunomide (ARAVA) 20 MG tablet, predniSONE (DELTASONE) 5 MG tablet, Sedimentation rate, C-reactive protein No objective signs of synovitis and labs have been stable. Rechecking sed rate and CRP today. If abnornal consider medication adjustment again otherwise same as before leflunomide and PRN prednisone. I believe pain issues are more related to osteoarthritis and some component of myalgia and hip  tendonpathy with worsened ROM.  High risk medication use - leflunomide 20 mg daily - Plan: CBC with Differential/Platelet, COMPLETE METABOLIC PANEL WITH GFR No serious interval infections seem to be related with continued lefulnomide. -Checking CBC and CMP medication monitoring on long term leflunomide  Long term (current) use of systemic steroids - prednisone 5 mg daily as needed  Bilateral hip pain - Plan: cyclobenzaprine (FLEXERIL) 5 MG tablet, Ambulatory referral to Physical Therapy Pain in feet, hips, and back, with increased pain in cold weather. Stiffness in hands and hips. Possible osteoarthritis. -Prescribe cyclobenzaprine 5mg  at bedtime for muscle relaxation. -Refer to physical therapy for gait assessment and fall risk reduction.  Raynaud's syndrome without gangrene - Plan: Beta-2 glycoprotein antibodies, Cardiolipin antibodies, IgG, IgM, IgA Numbness and tingling in all fingers, with intermittent bluish discoloration. No similar symptoms in toes. Possible Raynaud's phenomenon. Maybe related to early peripheral neuropathy. Limited treatment options due to existing cardiovascular medications. -Order blood work to further evaluate as above. -Discussed possible switch from aspirin to Plavix may improve symptoms or coagulopathy related, as had been better when on eliquis  Gastroesophageal Reflux Disease (GERD) Severe acid reflux despite omeprazole 40mg  twice daily. Patient also taking over-the-counter famotidine.  Cardiovascular Management Patient on amlodipine 10mg , olmesartan, hydralazine 100mg  three times daily, and low-dose aspirin. History of atrial fibrillation, myocarditis, and possible small vessel disease. -Continue current medications.   Orders: Orders Placed This Encounter  Procedures   Sedimentation rate   C-reactive protein   CBC with Differential/Platelet   COMPLETE METABOLIC PANEL WITH GFR   Beta-2 glycoprotein antibodies   Cardiolipin antibodies, IgG, IgM, IgA    Ambulatory referral to Physical Therapy   Meds ordered this encounter  Medications   leflunomide (ARAVA) 20 MG tablet    Sig: Take 1 tablet (20 mg total) by mouth daily.    Dispense:  90 tablet    Refill:  0   predniSONE (DELTASONE) 5 MG tablet    Sig: Take 1 tablet (5 mg total) by mouth daily as needed.    Dispense:  30 tablet    Refill:  2   cyclobenzaprine (FLEXERIL) 5 MG tablet    Sig: Take 1 tablet (5 mg total) by mouth at bedtime as needed.    Dispense:  30 tablet    Refill:  0     Follow-Up Instructions: Return in about 3 months (around 03/30/2024) for RA on LEF/GC f/u 3mos.   Fuller Plan, MD  Note - This record has been created using [employer].  Chart creation errors have been sought, but may not always  have been located. Such creation errors do not reflect on  the standard of medical care.

## 2023-12-18 IMAGING — MR MR CARD MORPHOLOGY WO/W CM
45 of 48 series · 45 of 48 positions shown · IV contrast (Gadavist)
Comparison: none

CLINICAL DATA: Evaluate for myocarditis

EXAM:
CARDIAC MRI
TECHNIQUE: The patient was scanned on a 1.5 Tesla Siemens magnet. A dedicated
cardiac coil was used. Functional imaging was done using Fiesta
sequences. [DATE], and 4 chamber views were done to assess for RWMA's.
Modified Unruly rule using a short axis stack was used to
calculate an ejection fraction on a dedicated work station using
Circle software. The patient received 10 cc of Gadavist. After 10
minutes inversion recovery sequences were used to assess for
infiltration and scar tissue.
CONTRAST:  10 cc  of Gadavist

[Series 4: t2_haste_db_tra_bh · axial · 8.0mm · 1.41mm/px · 1 of 16 slices shown]
[im 1/16]
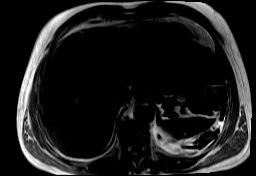

[Series 9: bSSFP · coronal · 8.0mm · 1.79mm/px · 1 of 25 slices shown (1 of 19)]
[im 1/25]
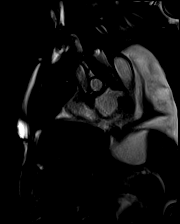

[Series 10: bSSFP · coronal · 8.0mm · 1.79mm/px · 1 of 25 slices shown (2 of 19)]
[im 1/25]
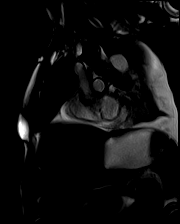

[Series 11: bSSFP · coronal · 8.0mm · 1.79mm/px · 1 of 25 slices shown (3 of 19)]
[im 1/25]
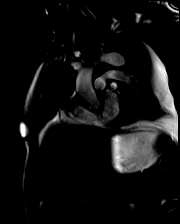

[Series 12: bSSFP · coronal · 8.0mm · 1.79mm/px · 1 of 25 slices shown (4 of 19)]
[im 1/25]
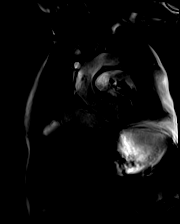

[Series 13: bSSFP · coronal · 8.0mm · 1.79mm/px · 1 of 25 slices shown (5 of 19)]
[im 1/25]
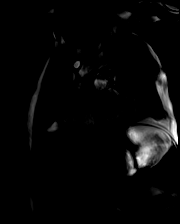

[Series 14: bSSFP · coronal · 8.0mm · 1.79mm/px · 1 of 25 slices shown (6 of 19)]
[im 1/25]
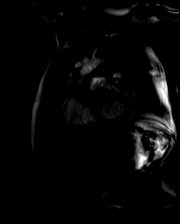

[Series 15: bSSFP · coronal · 8.0mm · 1.79mm/px · 1 of 25 slices shown (7 of 19)]
[im 1/25]
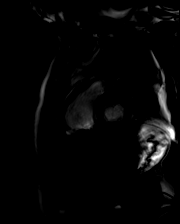

[Series 16: bSSFP · coronal · 8.0mm · 1.79mm/px · 1 of 25 slices shown (8 of 19)]
[im 1/25]
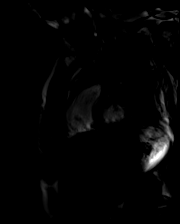

[Series 17: bSSFP · coronal · 8.0mm · 1.79mm/px · 1 of 25 slices shown (9 of 19)]
[im 1/25]
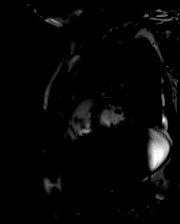

[Series 18: bSSFP · coronal · 8.0mm · 1.79mm/px · 1 of 25 slices shown (10 of 19)]
[im 1/25]
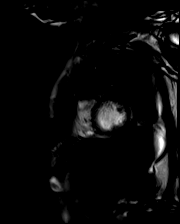

[Series 19: bSSFP · coronal · 8.0mm · 1.79mm/px · 1 of 25 slices shown (11 of 19)]
[im 1/25]
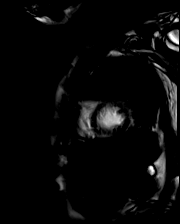

[Series 20: bSSFP · coronal · 8.0mm · 1.79mm/px · 1 of 25 slices shown (12 of 19)]
[im 1/25]
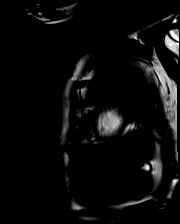

[Series 21: bSSFP · coronal · 8.0mm · 1.79mm/px · 1 of 25 slices shown (13 of 19)]
[im 1/25]
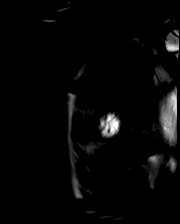

[Series 22: bSSFP · coronal · 8.0mm · 1.79mm/px · 1 of 25 slices shown (14 of 19)]
[im 1/25]
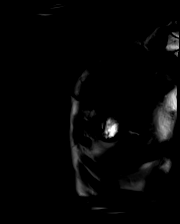

[Series 23: bSSFP · coronal · 8.0mm · 1.79mm/px · 1 of 25 slices shown (15 of 19)]
[im 1/25]
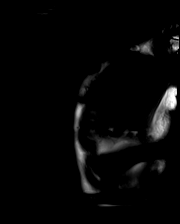

[Series 24: bSSFP · coronal · 8.0mm · 1.79mm/px · 1 of 25 slices shown (16 of 19)]
[im 1/25]
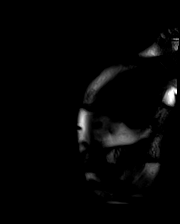

[Series 28: (id)_long_t1 · oblique · 8.0mm · 1.56mm/px · 1 of 24 slices shown]
[im 1/24]
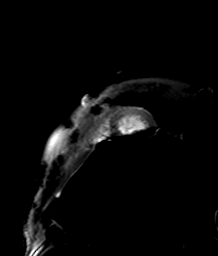

[Series 29: (id)_long_t1_moco · oblique · 8.0mm · 1.56mm/px · 1 of 24 slices shown]
[im 1/24]
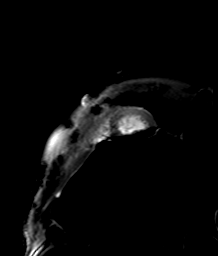

[Series 30: (id)_long_t1_moco_t1 · oblique · 8.0mm · 1.56mm/px · 1 of 3 slices shown (1 of 2)]
[im 1/3]
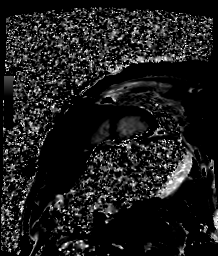

[Series 30: (id)_long_t1_moco_t1 · 1 of 3 slices shown (2 of 2)]
[im 1/3]
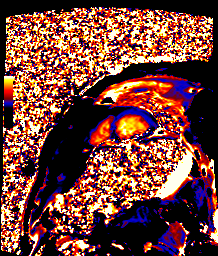

[Series 32: (id)_trufi · oblique · 8.0mm · 2.08mm/px · 1 of 9 slices shown]
[im 1/9]
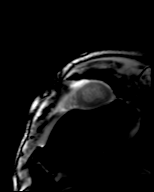

[Series 33: (id)_trufi_moco · oblique · 8.0mm · 2.08mm/px · 1 of 9 slices shown]
[im 1/9]
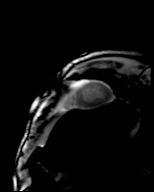

[Series 34: (id)_trufi_moco_t2 · oblique · 8.0mm · 2.08mm/px · 1 of 3 slices shown]
[im 1/3]
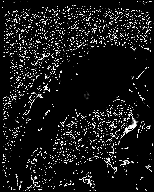

[Series 37: bSSFP · sagittal · 6.0mm · 1.41mm/px · 1 of 25 slices shown (17 of 19)]
[im 1/25]
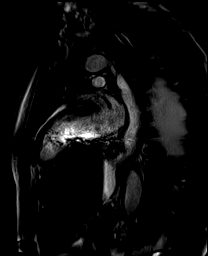

[Series 38: bSSFP · oblique · 6.0mm · 1.41mm/px · 1 of 25 slices shown (18 of 19)]
[im 1/25]
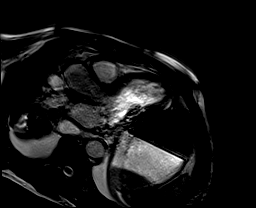

[Series 39: bSSFP · axial · 6.0mm · 1.41mm/px · 1 of 25 slices shown (19 of 19)]
[im 1/25]
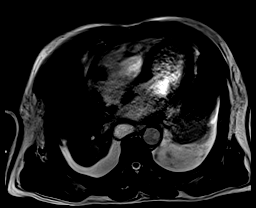

[Series 40: cine_trufi_cs_rt_short axis · coronal · 8.0mm · 1.92mm/px · 1 of 16 slices shown (1 of 18)]
[im 1/16]
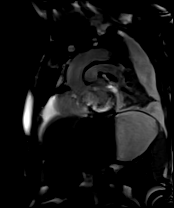

[Series 40: cine_trufi_cs_rt_short axis · coronal · 8.0mm · 1.92mm/px · 1 of 16 slices shown (2 of 18)]
[im 1/16]
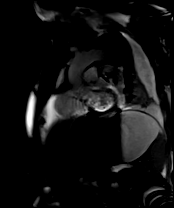

[Series 40: cine_trufi_cs_rt_short axis · coronal · 8.0mm · 1.92mm/px · 1 of 16 slices shown (3 of 18)]
[im 1/16]
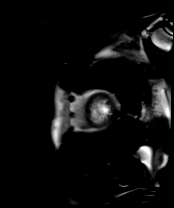

[Series 40: cine_trufi_cs_rt_short axis · coronal · 8.0mm · 1.92mm/px · 1 of 16 slices shown (4 of 18)]
[im 1/16]
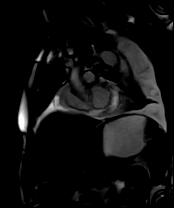

[Series 40: cine_trufi_cs_rt_short axis · coronal · 8.0mm · 1.92mm/px · 1 of 16 slices shown (5 of 18)]
[im 1/16]
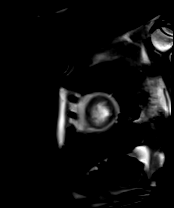

[Series 40: cine_trufi_cs_rt_short axis · coronal · 8.0mm · 1.92mm/px · 1 of 16 slices shown (6 of 18)]
[im 1/16]
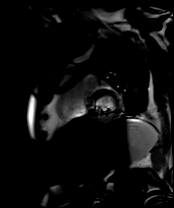

[Series 40: cine_trufi_cs_rt_short axis · coronal · 8.0mm · 1.92mm/px · 1 of 16 slices shown (7 of 18)]
[im 1/16]
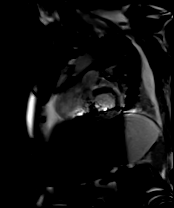

[Series 40: cine_trufi_cs_rt_short axis · coronal · 8.0mm · 1.92mm/px · 1 of 16 slices shown (8 of 18)]
[im 1/16]
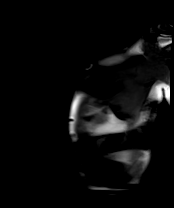

[Series 40: cine_trufi_cs_rt_short axis · coronal · 8.0mm · 1.92mm/px · 1 of 16 slices shown (9 of 18)]
[im 1/16]
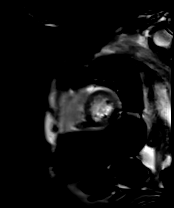

[Series 40: cine_trufi_cs_rt_short axis · coronal · 8.0mm · 1.92mm/px · 1 of 16 slices shown (10 of 18)]
[im 1/16]
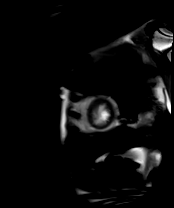

[Series 40: cine_trufi_cs_rt_short axis · coronal · 8.0mm · 1.92mm/px · 1 of 16 slices shown (11 of 18)]
[im 1/16]
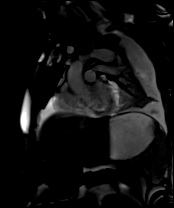

[Series 40: cine_trufi_cs_rt_short axis · coronal · 8.0mm · 1.92mm/px · 1 of 16 slices shown (12 of 18)]
[im 1/16]
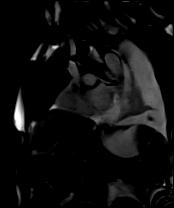

[Series 40: cine_trufi_cs_rt_short axis · coronal · 8.0mm · 1.92mm/px · 1 of 16 slices shown (13 of 18)]
[im 1/16]
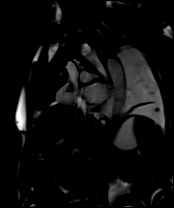

[Series 40: cine_trufi_cs_rt_short axis · coronal · 8.0mm · 1.92mm/px · 1 of 16 slices shown (14 of 18)]
[im 1/16]
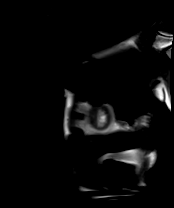

[Series 40: cine_trufi_cs_rt_short axis · coronal · 8.0mm · 1.92mm/px · 1 of 16 slices shown (15 of 18)]
[im 1/16]
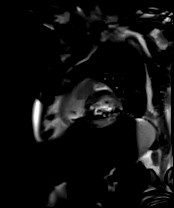

[Series 40: cine_trufi_cs_rt_short axis · coronal · 8.0mm · 1.92mm/px · 1 of 16 slices shown (16 of 18)]
[im 1/16]
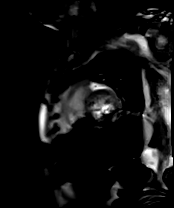

[Series 41: cine_trufi_cs_rt_short axis · axial · 8.0mm · 1.92mm/px · 1 of 15 slices shown (17 of 18)]
[im 1/15]
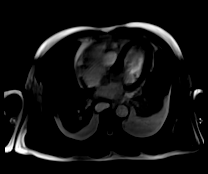

[Series 42: cine_trufi_cs_rt_short axis · oblique · 6.0mm · 1.92mm/px · 1 of 14 slices shown (18 of 18)]
[im 1/14]
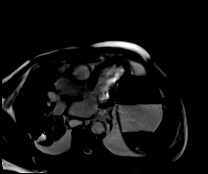

[45 of 48 positions shown; findings below may reference images not displayed]

FINDINGS: Left ventricle:

-Normal size

-Normal systolic function

-Artifact prevents interpretation of T1 maps

-Elevated native T2 values in inferior/inferolateral walls

-Basal inferolateral midwall LGE

LV EF: 63% (Normal 56-78%)

Absolute volumes:

LV EDV: 138mL (Normal 77-195 mL)

LV ESV: 51mL (Normal 19-72 mL)

LV SV: 88mL (Normal 51-133 mL)

CO: 5.8L/min (Normal 2.8-8.8 L/min)

Indexed volumes:

LV EDV: 77mL/sq-m (Normal 47-92 mL/sq-m)

LV ESV: 28mL/sq-m (Normal 13-30 mL/sq-m)

LV SV: 49mL/sq-m (Normal 32-62 mL/sq-m)

CI: 3.3L/min/sq-m (Normal 1.7-4.2 L/min/sq-m)

Right ventricle: Normal size and systolic function

RV EF:  58% (Normal 47-74%)

Absolute volumes:

RV EDV: 157mL (Normal 88-227 mL)

RV ESV: 66mL (Normal 23-103 mL)

RV SV: 91mL (Normal 52-138 mL)

CO: 6.1L/min (Normal 2.8-8.8 L/min)

Indexed volumes:

RV EDV: 88mL/sq-m (Normal 55-105 mL/sq-m)

RV ESV: 37mL/sq-m (Normal 15-43 mL/sq-m)

RV SV: 51mL/sq-m (Normal 32-64 mL/sq-m)

CI: 3.4L/min/sq-m (Normal 1.7-4.2 L/min/sq-m)

Left atrium: Mild enlargement

Right atrium: Mild enlargement

Mitral valve: Trivial regurgitation

Aortic valve: No regurgitation

Tricuspid valve: Trivial regurgitation

Pulmonic valve: No regurgitation

Aorta: Normal proximal ascending aorta

Pericardium: Normal

Extracardiac structures: Moderate left, small right pleural
effusions
IMPRESSION: 1. Findings suggestive of acute myocarditis, with elevated
myocardial T2 values and basal inferolateral midwall late gadolinium
enhancement, which is a scar pattern commonly seen in myocarditis

2.  Normal LV size and systolic function (EF 63%)

3.  Normal RV size and systolic function (EF 58%)

4.  Moderate left and small right pleural effusions

## 2023-12-18 NOTE — Telephone Encounter (Signed)
 RN called spoke pharmacy tech. Dr Anner is aware and patient aware. Re challenging medication .   Pharmacy technician verbalized understanding   Per Verbal conversation with Dr Anner on 12/16/22.   Dr Anner spoke with patient - discuss possible reaction with Imdur . Patient states he does not remember, but  documentation states feeling weak.   Per Dr Anner - not an usual reaction  will re challenge medication  an change to possible intolerance  Patient was in agreement to try again

## 2023-12-21 ENCOUNTER — Encounter: Payer: Self-pay | Admitting: Cardiology

## 2023-12-21 DIAGNOSIS — R5382 Chronic fatigue, unspecified: Secondary | ICD-10-CM | POA: Insufficient documentation

## 2023-12-21 NOTE — Assessment & Plan Note (Signed)
 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  Well-healed. Radial access was not used because of inability to advance the catheter in the previous catheterizations.  In the future would probably require left wrist access.

## 2023-12-21 NOTE — Assessment & Plan Note (Signed)
 Chronic, worsening over the past few years. Likely related to Rheumatoid Arthritis rather than cardiac issues. -No changes to current management. No notable change since stopping beta-blocker.

## 2023-12-21 NOTE — Assessment & Plan Note (Signed)
 On aggressive CV risk factor modification.  Remains on aspirin, statin and aggressive BP control.

## 2023-12-21 NOTE — Assessment & Plan Note (Signed)
 Labs from October 2024 showed total cholesterol 121, HDL 31, LDL 50 and triglycerides 197.  Well-controlled on current dose of statin and Zetia .  He is only on 5 mg simvastatin  10 Zetia  and co-Q10, seems to tolerating relatively well.  Would not titrate statin further because of fatigue.

## 2023-12-21 NOTE — Assessment & Plan Note (Signed)
 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  This was complicated by A-fib but has not had any breakthrough spells of A-fib or further concerning symptoms.

## 2023-12-21 NOTE — Assessment & Plan Note (Signed)
 Intolerant of higher dose of statin if things are doing okay on 5 mg simvastatin plus co-Q10 and Zetia.

## 2023-12-21 NOTE — Assessment & Plan Note (Signed)
 Despite increased doses of Hydralazine, Amlodipine, and Olmesartan, blood pressure remains elevated. -Continue current regimen. -Add Imdur 30mg  at night, which may also aid in blood pressure control.

## 2023-12-21 NOTE — Assessment & Plan Note (Signed)
 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 Not on beta-blocker because of fatigue and bradycardia-this was stopped by Dr. Fernande.  Not anticoagulation with no recurrent episodes.  This is per patient preference and long discussions elsewhere.

## 2023-12-21 NOTE — Assessment & Plan Note (Signed)
 Stent placed in 2021 remains patent. Intermittent chest pain at rest, relieved by nitroglycerin . No chest pain with exertion. - Patent stent noted on last cardiac catheterization -Continue current regimen of low-dose Simvastatin , Zetia , and CoQ10. -Add Imdur  30mg  at night, starting with a half tablet, to be taken 30 minutes after Aspirin . (listed as intolerance - Sx of fatigue - but that has not changed since stopping- will Re-Challenge) - Already on Amlodipine  at 10 mg; with resting HR of 60 and fatigue, Beta Blockers are not a good option for antianginal Rx. - I question whether or not the chest pain is truly cardiac in nature vs. related to RA

## 2023-12-21 NOTE — Assessment & Plan Note (Signed)
 Lower extremity edema Intermittent, likely related to Amlodipine and Pentecost use. -Continue current regimen. -Advise patient to elevate legs and perform pedal motion exercises when sitting for long periods.

## 2023-12-29 ENCOUNTER — Encounter: Payer: Self-pay | Admitting: Family Medicine

## 2023-12-29 ENCOUNTER — Ambulatory Visit (INDEPENDENT_AMBULATORY_CARE_PROVIDER_SITE_OTHER): Payer: PPO | Admitting: Family Medicine

## 2023-12-29 VITALS — BP 132/68 | HR 78 | Temp 98.0°F | Resp 16 | Ht 67.0 in | Wt 171.4 lb

## 2023-12-29 DIAGNOSIS — K219 Gastro-esophageal reflux disease without esophagitis: Secondary | ICD-10-CM | POA: Diagnosis not present

## 2023-12-29 DIAGNOSIS — I7300 Raynaud's syndrome without gangrene: Secondary | ICD-10-CM | POA: Diagnosis not present

## 2023-12-29 MED ORDER — FAMOTIDINE 40 MG PO TABS: 40.0000 mg | ORAL_TABLET | Freq: Every day | ORAL | 1 refills | Status: DC

## 2023-12-29 NOTE — Progress Notes (Signed)
Chief Complaint  Patient presents with   Gastroesophageal Reflux    Discuss reflux and hernia     GERD Noah Grant is a 73 y.o. male who presents for follow up of GERD. Takes omeprazole 40 mg twice daily. Reflux symptoms have been worsening, including burning in his chest, belching, and waterbrash. He denies abdominal bloating, waterbrash, upper abdominal discomfort, cough, dysphagia, weight loss Aggravating factors and specific triggers include lying down and eating . He takes famotidine intermittently. He does have a history of Barrett's esophagus.  He follows with the [facility] team.  Past Medical History:  Diagnosis Date   Abdominal aortic atherosclerosis (HCC)    Anemia    likely of chronic disease   Angina pectoris (HCC) 08/30/2020   Arthritis    rheumatoid   Atrial fibrillation (HCC)    Atrial fibrillation with rapid ventricular response (HCC) 05/12/2022   06/20/23 - pt states he's not had any A-fib   Barrett's esophagus without dysplasia 03/25/2019   Formatting of this note might be different from the original. EGD done in 02/2019. Due in 3 years.   Benign prostatic hyperplasia with nocturia 12/29/2018   Bipolar 1 disorder (HCC)    Bipolar 1 disorder, mixed, moderate (HCC) 08/16/2014   Bipolar 2 disorder (HCC) 08/16/2014   Bipolar affective disorder, currently depressed, moderate (HCC) 03/08/2020   Bipolar depression (HCC) 08/16/2014   BPH with urinary obstruction 12/29/2018   Brain ischemia 03/08/2020   Cardiac murmur 08/30/2020   Chest pain 09/06/2020   Chicken pox    Chronic bronchitis (HCC)    Chronic kidney disease, stage 3a (HCC) 07/21/2019   Cognitive complaints 03/08/2020   Coronary artery disease involving native coronary artery of native heart 09/06/2020   Coronary artery disease involving native coronary artery of native heart with angina pectoris (HCC) 09/06/2020   Depression    Disc degeneration, 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 Last Assessment & Plan:  Well controlled. Continue current medications.   GERD without esophagitis 08/16/2014   Formatting of this note might be different from the original. Last Assessment & Plan:  Well controlled. Continue current medications.   Herpes gingivostomatitis 02/10/2015   High risk medication use 03/23/2018   History of colon polyps 03/25/2019   Formatting of this note might be different from the original. tubular adenoma   History of stroke 11/10/2019   Hyperlipidemia    Hyperphosphatemia 10/30/2022   Hypertension    Ingrowing nail 03/12/2020   Intention tremor 06/16/2019   Labile hypertension 08/16/2014   Formatting of this note might be different from the original. Last Assessment & Plan:  Slightly above goal today secondary to pain. Will continue current regimen. Will check CMP today.   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 Concern over possible lichen planus. His dentist noticed lesions of his oral mucosa.  It was felt to be lichen planus.  He presents to discuss further.  Rarely does the area hurt.He smoked in the distant past EXAM shows several white patches on the buccal mucosa bilaterally.  No other concerning oral mucosal les   Lithium use 10/30/2022   Long-term use of immunosuppressant  medication 03/23/2018   Low testosterone 09/17/2016   Midline thoracic back pain 02/10/2015   Mixed hyperlipidemia 08/16/2014   Myocarditis (HCC) 05/14/2022   Normochromic normocytic anemia 08/16/2014   Normocytic anemia    likely of chronic disease   PAD (peripheral artery disease) (HCC) 03/23/2019   Pain of right hip  joint 01/19/2016   Pericarditis 05/12/2022   Rheumatic fever    Rheumatoid arthritis (HCC) 10/29/2018   Formatting of this note might be different from the original. Rheum:  Dr. Sharmon Revere Formerly on MTX - stopped due to mouth sores. Currently started on Arava.  Also on 5 mg prednisone x 3 months during run-in.   Right-sided chest wall pain 04/17/2019   Seasonal allergies 03/23/2018   Sepsis (HCC) 05/11/2022   Stasis dermatitis of both legs 07/29/2022   Statin intolerance 07/31/2020   Stroke (HCC)    3 - TIA's   Task-specific dystonia of hand    right hand   Thoracic radiculopathy 06/16/2019   Vitamin B12 deficiency    Injections   Vitamin D deficiency 09/17/2016    Exam BP 132/68   Pulse 78   Temp 98 F (36.7 C) (Oral)   Resp 16   Ht 5\' 7"  (1.702 m)   Wt 171 lb 6.4 oz (77.7 kg)   SpO2 98%   BMI 26.85 kg/m  General:  well developed, well nourished, in no apparent distress Lungs:  clear to auscultation, breath sounds equal bilaterally, no respiratory distress, no wheezes Cardio:  regular rate and rhythm without murmurs, heart sounds without clicks or rubs Abdomen:  abdomen soft, nontender; bowel sounds normal; no masses or organomegaly Psych: Normal affect and mood  Assessment and Plan  Gastroesophageal reflux disease, unspecified whether esophagitis present - Plan: famotidine (PEPCID) 40 MG tablet  Raynaud's disease without gangrene  Chronic, unstable.  Continue omeprazole 40 mg twice daily.  He has failed Protonix as well.  Add famotidine 40 mg twice daily.  Reach out to GI team for further management.  Reflux precautions verbalized and written down. Offered to increase his amlodipine but he does not wish to do this.  He will let me know if anything changes. Pt voiced understanding and agreement to the plan.  Jilda Roche [place], DO 12/29/23  4:41 PM

## 2023-12-29 NOTE — Patient Instructions (Signed)
Please follow up with Dr. Conley Rolls  Continue the omeprazole 40 mg twice daily. I have sent in a prescription strength famotidine to take twice daily with it.   The only lifestyle changes that have data behind them are weight loss for the overweight/obese and elevating the head of the bed. Finding out which foods/positions are triggers is important.  Let us know if you need anything.

## 2023-12-31 ENCOUNTER — Encounter: Payer: Self-pay | Admitting: Internal Medicine

## 2023-12-31 ENCOUNTER — Ambulatory Visit: Payer: PPO | Attending: Internal Medicine | Admitting: Internal Medicine

## 2023-12-31 ENCOUNTER — Other Ambulatory Visit: Payer: Self-pay | Admitting: Family Medicine

## 2023-12-31 VITALS — BP 127/67 | HR 62 | Resp 14 | Ht 67.0 in | Wt 167.0 lb

## 2023-12-31 DIAGNOSIS — M25552 Pain in left hip: Secondary | ICD-10-CM | POA: Diagnosis not present

## 2023-12-31 DIAGNOSIS — Z7952 Long term (current) use of systemic steroids: Secondary | ICD-10-CM

## 2023-12-31 DIAGNOSIS — M65332 Trigger finger, left middle finger: Secondary | ICD-10-CM | POA: Diagnosis not present

## 2023-12-31 DIAGNOSIS — M25551 Pain in right hip: Secondary | ICD-10-CM | POA: Diagnosis not present

## 2023-12-31 DIAGNOSIS — I7300 Raynaud's syndrome without gangrene: Secondary | ICD-10-CM | POA: Diagnosis not present

## 2023-12-31 DIAGNOSIS — M069 Rheumatoid arthritis, unspecified: Secondary | ICD-10-CM | POA: Diagnosis not present

## 2023-12-31 DIAGNOSIS — R351 Nocturia: Secondary | ICD-10-CM

## 2023-12-31 DIAGNOSIS — Z79899 Other long term (current) drug therapy: Secondary | ICD-10-CM

## 2023-12-31 MED ORDER — CYCLOBENZAPRINE HCL 5 MG PO TABS: 5.0000 mg | ORAL_TABLET | Freq: Every evening | ORAL | 0 refills | Status: DC | PRN

## 2023-12-31 MED ORDER — LEFLUNOMIDE 20 MG PO TABS
20.0000 mg | ORAL_TABLET | Freq: Every day | ORAL | 0 refills | Status: DC
Start: 2023-12-31 — End: 2024-03-30

## 2023-12-31 MED ORDER — PREDNISONE 5 MG PO TABS
5.0000 mg | ORAL_TABLET | Freq: Every day | ORAL | 2 refills | Status: DC | PRN
Start: 2023-12-31 — End: 2024-03-30

## 2024-01-01 ENCOUNTER — Telehealth: Payer: Self-pay | Admitting: *Deleted

## 2024-01-01 NOTE — Telephone Encounter (Signed)
Submitted a Prior Authorization request to [employer] for Flexeril via CoverMyMeds. Will update once we receive a response.

## 2024-01-02 NOTE — Telephone Encounter (Signed)
Received a fax regarding Prior Authorization from [employer] for Flexeril. Authorization has been DENIED because it is being used for an indication which is not approved or medically-accepted: Hip, feet,and back pain/stiffness.  Phone# [phone]  Contacted the patient and advised the prior authorization for the flexeril was denied. Advised the patient he could go on to [employer] to see the prices for the medication at different pharmacies. Patient verbalized understanding.

## 2024-01-05 LAB — COMPLETE METABOLIC PANEL WITH GFR
AG Ratio: 2.1 (calc) (ref 1.0–2.5)
ALT: 15 U/L (ref 9–46)
AST: 16 U/L (ref 10–35)
Albumin: 4.4 g/dL (ref 3.6–5.1)
Alkaline phosphatase (APISO): 61 U/L (ref 35–144)
BUN/Creatinine Ratio: 17 (calc) (ref 6–22)
BUN: 29 mg/dL — ABNORMAL HIGH (ref 7–25)
CO2: 26 mmol/L (ref 20–32)
Calcium: 9.5 mg/dL (ref 8.6–10.3)
Chloride: 108 mmol/L (ref 98–110)
Creat: 1.72 mg/dL — ABNORMAL HIGH (ref 0.70–1.28)
Globulin: 2.1 g/dL (ref 1.9–3.7)
Glucose, Bld: 92 mg/dL (ref 65–99)
Potassium: 5.4 mmol/L — ABNORMAL HIGH (ref 3.5–5.3)
Sodium: 140 mmol/L (ref 135–146)
Total Bilirubin: 0.3 mg/dL (ref 0.2–1.2)
Total Protein: 6.5 g/dL (ref 6.1–8.1)
eGFR: 42 mL/min/{1.73_m2} — ABNORMAL LOW (ref >=60)

## 2024-01-05 LAB — CBC WITH DIFFERENTIAL/PLATELET
Absolute Lymphocytes: 350 {cells}/uL — ABNORMAL LOW (ref 850–3900)
Absolute Monocytes: 758 {cells}/uL (ref 200–950)
Basophils Absolute: 58 {cells}/uL (ref 0–200)
Basophils Relative: 1.1 %
Eosinophils Absolute: 239 {cells}/uL (ref 15–500)
Eosinophils Relative: 4.5 %
HCT: 32.7 % — ABNORMAL LOW (ref 38.5–50.0)
Hemoglobin: 10.6 g/dL — ABNORMAL LOW (ref 13.2–17.1)
MCH: 30.5 pg (ref 27.0–33.0)
MCHC: 32.4 g/dL (ref 32.0–36.0)
MCV: 94 fL (ref 80.0–100.0)
MPV: 12.4 fL (ref 7.5–12.5)
Monocytes Relative: 14.3 %
Neutro Abs: 3896 {cells}/uL (ref 1500–7800)
Neutrophils Relative %: 73.5 %
Platelets: 211 10*3/uL (ref 140–400)
RBC: 3.48 10*6/uL — ABNORMAL LOW (ref 4.20–5.80)
RDW: 11.5 % (ref 11.0–15.0)
Total Lymphocyte: 6.6 %
WBC: 5.3 10*3/uL (ref 3.8–10.8)

## 2024-01-05 LAB — BETA-2 GLYCOPROTEIN ANTIBODIES
Beta-2 Glyco 1 IgA: <2 U/mL (ref <20.0)
Beta-2 Glyco 1 IgM: <2 U/mL (ref <20.0)
Beta-2 Glyco I IgG: <2 U/mL (ref <20.0)

## 2024-01-05 LAB — CARDIOLIPIN ANTIBODIES, IGG, IGM, IGA
Anticardiolipin IgA: 2 [APL'U]/mL (ref <20.0)
Anticardiolipin IgG: <2 [GPL'U]/mL (ref <20.0)
Anticardiolipin IgM: <2 [MPL'U]/mL (ref <20.0)

## 2024-01-05 LAB — C-REACTIVE PROTEIN: CRP: <3 mg/L (ref <8.0)

## 2024-01-05 LAB — SEDIMENTATION RATE: Sed Rate: 6 mm/h (ref 0–20)

## 2024-01-26 ENCOUNTER — Encounter: Payer: Self-pay | Admitting: Physical Therapy

## 2024-01-26 ENCOUNTER — Ambulatory Visit: Payer: PPO | Attending: Internal Medicine | Admitting: Physical Therapy

## 2024-01-26 ENCOUNTER — Other Ambulatory Visit: Payer: Self-pay

## 2024-01-26 DIAGNOSIS — M25552 Pain in left hip: Secondary | ICD-10-CM | POA: Diagnosis not present

## 2024-01-26 DIAGNOSIS — R2689 Other abnormalities of gait and mobility: Secondary | ICD-10-CM | POA: Diagnosis not present

## 2024-01-26 DIAGNOSIS — M6281 Muscle weakness (generalized): Secondary | ICD-10-CM | POA: Diagnosis not present

## 2024-01-26 DIAGNOSIS — R2681 Unsteadiness on feet: Secondary | ICD-10-CM | POA: Diagnosis not present

## 2024-01-26 DIAGNOSIS — M25551 Pain in right hip: Secondary | ICD-10-CM | POA: Diagnosis not present

## 2024-01-26 NOTE — Therapy (Signed)
OUTPATIENT PHYSICAL THERAPY LOWER EXTREMITY EVALUATION   Patient Name: Noah Grant MRN: [id number] DOB:1951/03/29, 73 y.o., male Today's Date: 01/26/2024  END OF SESSION:  PT End of Session - 01/26/24 1412     Visit Number 1    Date for PT Re-Evaluation 04/05/24    Authorization Type HTA - Medicare guidelines    Progress Note Due on Visit 10    PT Start Time 1315    PT Stop Time 1400    PT Time Calculation (min) 45 min    Activity Tolerance Patient tolerated treatment well    Behavior During Therapy [facility] for tasks assessed/performed             Past Medical History:  Diagnosis Date   Abdominal aortic atherosclerosis (HCC)    Anemia    likely of chronic disease   Angina pectoris (HCC) 08/30/2020   Arthritis    rheumatoid   Atrial fibrillation (HCC)    Atrial fibrillation with rapid ventricular response (HCC) 05/12/2022   06/20/23 - pt states he's not had any A-fib   Barrett's esophagus without dysplasia 03/25/2019   Formatting of this note might be different from the original. EGD done in 02/2019. Due in 3 years.   Benign prostatic hyperplasia with nocturia 12/29/2018   Bipolar 1 disorder (HCC)    Bipolar 1 disorder, mixed, moderate (HCC) 08/16/2014   Bipolar 2 disorder (HCC) 08/16/2014   Bipolar affective disorder, currently depressed, moderate (HCC) 03/08/2020   Bipolar depression (HCC) 08/16/2014   BPH with urinary obstruction 12/29/2018   Brain ischemia 03/08/2020   Cardiac murmur 08/30/2020   Chest pain 09/06/2020   Chicken pox    Chronic bronchitis (HCC)    Chronic kidney disease, stage 3a (HCC) 07/21/2019   Cognitive complaints 03/08/2020   Coronary artery disease involving native coronary artery of native heart 09/06/2020   Coronary artery disease involving native coronary artery of native heart with angina pectoris (HCC) 09/06/2020   Depression    Disc degeneration, 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   Drug therapy 05/18/2021   Elevated troponin level not due  myocardial infarction 05/11/2022   Essential hypertension    GAD (generalized anxiety disorder) 11/02/2018   Gastroesophageal reflux disease 08/16/2014   Formatting of this note might be different from the original. Last Assessment & Plan:  Well controlled. Continue current medications.   GERD without esophagitis 08/16/2014   Formatting of this note might be different from the original. Last Assessment & Plan:  Well controlled. Continue current medications.   Herpes gingivostomatitis 02/10/2015   Hiatal hernia    High risk medication use 03/23/2018   History of colon polyps 03/25/2019   Formatting of this note might be different from the original. tubular adenoma   History of stroke 11/10/2019   Hyperlipidemia    Hyperphosphatemia 10/30/2022   Hypertension    Ingrowing nail 03/12/2020   Intention tremor 06/16/2019   Labile hypertension 08/16/2014   Formatting of this note might be different from the original. Last Assessment & Plan:  Slightly above goal today secondary to pain. Will continue current regimen. Will check CMP today.   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 Concern over possible lichen planus. His dentist noticed lesions of his oral mucosa.  It was felt to be lichen planus.  He presents to discuss further.  Rarely does the area hurt.He smoked in the distant past EXAM shows several  white patches on the buccal mucosa bilaterally.  No other concerning oral mucosal les   Lithium use 10/30/2022   Long-term use of immunosuppressant medication 03/23/2018   Low testosterone 09/17/2016   Midline thoracic back pain 02/10/2015   Mixed hyperlipidemia 08/16/2014   Myocarditis (HCC) 05/14/2022   Normochromic normocytic anemia 08/16/2014   Normocytic anemia    likely of chronic disease   PAD (peripheral artery disease) (HCC) 03/23/2019   Pain of right hip joint 01/19/2016    Pericarditis 05/12/2022   Rheumatic fever    Rheumatoid arthritis (HCC) 10/29/2018   Formatting of this note might be different from the original. Rheum:  Dr. Sharmon Revere Formerly on MTX - stopped due to mouth sores. Currently started on Arava.  Also on 5 mg prednisone x 3 months during run-in.   Right-sided chest wall pain 04/17/2019   Seasonal allergies 03/23/2018   Sepsis (HCC) 05/11/2022   Stasis dermatitis of both legs 07/29/2022   Statin intolerance 07/31/2020   Stroke (HCC)    3 - TIA's   Task-specific dystonia of hand    right hand   Thoracic radiculopathy 06/16/2019   Vitamin B12 deficiency    Injections   Vitamin D deficiency 09/17/2016   Past Surgical History:  Procedure Laterality Date   CLAVICLE SURGERY     Right, Hardware placed   CORONARY STENT INTERVENTION N/A 09/06/2020   Procedure: CORONARY STENT INTERVENTION;  Surgeon: Tonny Bollman, MD;  Location: [facility] INVASIVE CV LAB;  Service: Cardiovascular;  Laterality: N/A;   FALSE ANEURYSM REPAIR Right 06/23/2023   Procedure: RIGHT ULNAR ARTERY PSEUDOANEURYSM REPAIR;  Surgeon: [name], MD;  Location: [facility] OR;  Service: Vascular;  Laterality: Right;   ingrown toenail Bilateral    great toes   LEFT HEART CATH AND CORONARY ANGIOGRAPHY N/A 09/06/2020   Procedure: LEFT HEART CATH AND CORONARY ANGIOGRAPHY;  Surgeon: Tonny Bollman, MD;  Location: [facility] INVASIVE CV LAB;  Service: Cardiovascular;  Laterality: N/A;   LEFT HEART CATH AND CORONARY ANGIOGRAPHY N/A 06/06/2023   Procedure: LEFT HEART CATH AND CORONARY ANGIOGRAPHY;  Surgeon: Marykay Lex, MD;  Location: [facility] INVASIVE CV LAB;  Service: Cardiovascular;  Laterality: N/A;   WISDOM TOOTH EXTRACTION     Patient Active Problem List   Diagnosis Date Noted   Chronic fatigue 12/21/2023   Trigger finger, left middle finger 09/30/2023   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   Hyperphosphatemia 10/30/2022   Lithium use 10/30/2022   Anemia 09/02/2022   Atrial fibrillation (HCC)  09/02/2022   Stasis dermatitis of both legs 07/29/2022   Myocarditis (HCC) 05/14/2022   Pericarditis 05/12/2022   Atrial fibrillation with rapid ventricular response (HCC) 05/12/2022   Sepsis (HCC) 05/11/2022   Elevated troponin level not due myocardial infarction 05/11/2022   Lactic acidosis 05/11/2022   Drug therapy 05/18/2021   Normocytic anemia    Arthritis    Bipolar 1 disorder (HCC)    Chicken pox    Chronic bronchitis (HCC)    Depression    Hyperlipidemia with target low density lipoprotein (LDL) cholesterol less than 55 mg/dL    Rheumatic fever    Essential hypertension    Chest pain 09/06/2020   Coronary artery disease involving native coronary artery of native heart with angina pectoris (HCC) 09/06/2020   Angina pectoris (HCC) 08/30/2020   Cardiac murmur 08/30/2020   Statin intolerance 07/31/2020   Abdominal aortic atherosclerosis (HCC)    Ingrowing nail 03/12/2020   Bipolar affective disorder, currently depressed, moderate (HCC) 03/08/2020   Brain  ischemia 03/08/2020   Cognitive complaints 03/08/2020   History of stroke 11/10/2019   Chronic kidney disease, stage 3a (HCC) 07/21/2019   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   Thoracic radiculopathy 06/16/2019   Intention tremor 06/16/2019   Right-sided chest wall pain 04/17/2019   Barrett's esophagus without dysplasia 03/25/2019   History of colon polyps 03/25/2019   PAD (peripheral artery disease) (HCC) 03/23/2019   Benign prostatic hyperplasia with nocturia 12/29/2018   BPH with urinary obstruction 12/29/2018   GAD (generalized anxiety disorder) 11/02/2018   Rheumatoid arthritis (HCC) 10/29/2018   Lichen planus 04/07/2018   High risk medication use 03/23/2018   Seasonal allergies 03/23/2018   Long-term use of immunosuppressant medication 03/23/2018   Low testosterone 09/17/2016   Vitamin B12 deficiency 09/17/2016   Vitamin D deficiency 09/17/2016   Disc degeneration, 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   Pain of right hip  joint 01/19/2016   Herpes gingivostomatitis 02/10/2015   Midline thoracic back pain 02/10/2015   Normochromic normocytic anemia 08/16/2014   Bipolar 1 disorder, mixed, moderate (HCC) 08/16/2014   Gastroesophageal reflux disease 08/16/2014   Bipolar depression (HCC) 08/16/2014   GERD without esophagitis 08/16/2014   Bipolar 2 disorder (HCC) 08/16/2014    PCP: Sharlene Dory, DO    REFERRING PROVIDER: Fuller Plan, MD  REFERRING DIAG: [phone] (ICD-10-CM) - Bilateral hip pain   THERAPY DIAG:  Other abnormalities of gait and mobility  Unsteadiness on feet  Muscle weakness (generalized)  Pain of both hip joints  Rationale for Evaluation and Treatment: Rehabilitation  ONSET DATE: progressively worsening over years  SUBJECTIVE:   SUBJECTIVE STATEMENT: My doctor sent me here for my duck feet.  My feet are turning out more and more, which makes my hips and joints hurt more.  Wife tells him to "waddle faster". He also reports feeling off-balance, keeps cane with him or reaches for wall.  He also has unstable angina, frequently has to use nitroglycerin, 3x last week alone.  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  Gets really tired easily, fatigue is a problem, has a small dog, tries to walk him, but has difficulty walking up incline.   Takes a nap after breakfast and after lunch due to fatigue.    PERTINENT HISTORY: OA, RA, CKD, CAD, Afib, HTN, PAD, chronic fatigue, Barette's esophagus, GERD, Bipolar 1, 2, long term lithium use, anemia, tremor, unstable angina (has nitroglycerin) PAIN:  Are you having pain? Yes: NPRS scale: 6-7/10 Pain location: generalized pain hips and shoulders bilaterally Pain description: ache Aggravating factors: sitting too long, sleeping on shoulders Relieving factors: moving around  PRECAUTIONS: Other: unstable angina, has nitroglycerin  RED FLAGS: None   WEIGHT BEARING RESTRICTIONS: No  FALLS:  Has patient fallen in last 6 months? no falls but feels off balance  LIVING ENVIRONMENT: Lives with: lives with their spouse Lives in: House/apartment Stairs: No  1 low step to front door Has following equipment at home: Single point cane  OCCUPATION: retired  PLOF: Independent with community mobility with device  PATIENT GOALS: improve walking so not as sore  NEXT MD VISIT: 03/22/24 with Dr. Carmelia Roller, 03/30/24 with Dr. Dimple Casey  OBJECTIVE:  Note: Objective measures were completed at Evaluation unless otherwise noted.  DIAGNOSTIC FINDINGS:  04/19/2022 X-ray bilateral hips and pelvis   Visualized portion of the lumbar spine appears to have some lateral  osteophyte formation.  Minimal femoral acetabular joint change few small  cysts are present.  Bone mineralization appears normal no erosions  or  other abnormal calcifications seen.   PATIENT SURVEYS:  ABC scale 490/1600 = 30.6% LEFS 17/80 = 21.25%  COGNITION: Overall cognitive status: Within functional limits for tasks assessed     POSTURE: rounded shoulders and forward head  - noted pill rolling tremor in right hand   LOWER EXTREMITY MMT:  MMT Right eval Left eval  Hip flexion 3+ 3+  Hip extension    Hip abduction 4+ 4+  Hip adduction 3+ 3+  Knee flexion 4+ 4+  Knee extension 4+ 4+  Ankle dorsiflexion 4 4  Ankle plantarflexion     (Blank rows = not tested)  LOWER EXTREMITY ROM:  Not tested formally today.  Noted stiffness with all movement in LE.    FUNCTIONAL TESTS:  5 times sit to stand: 1 min 6 sec Timed up and go (TUG): 17.38 seconds with SPC MCTSIB: Condition 1: Avg of 3 trials: 30 sec, Condition 2: Avg of 3 trials: 30 sec, Condition 3: Avg of 3 trials: 13.3 sec, Condition 4: Avg of 3 trials: 13.3 sec, and Total Score: 86.6/120  GAIT: Distance walked: 53' Assistive device utilized: Single point cane Level of assistance: Modified independence Comments: hips externally rotated, decreased gait speed, 0.75  m/s                                                                                                                                 TREATMENT DATE:   01/26/24 EVAL Self Care: Findings, POC, importance of mobility and building strength to decrease muscle fatigue which may be partially contributing to pain.  Education on form with sit to stand ("nose over toes") to improve ability to transition better.    Checked cane height by request, it was adjusted correctly.     PATIENT EDUCATION:  Education details: see self care Person educated: Patient Education method: Explanation Education comprehension: verbalized understanding  HOME EXERCISE PROGRAM: TBD  ASSESSMENT:  CLINICAL IMPRESSION:  Noah Grant  is a 73 y.o. male referred to OPPT for evaluation and treatment with diagnosis of low back, bil hip and foot pain from rheumatoid arthritis for assessment of gait and fall reduction. Patient presents with physical impairments of impaired activity tolerance, impaired standing balance, impaired ambulation, and decreased safety awareness impacting safe and independent functional mobility. Examination revealed patient is at risk for falls and functional decline as evidenced by the following objective test measures: Gait speed 0.75 m/sec, (76m/sec is needed for community access), mCTSIB: position 1:  30 sec, position 2: 30 sec, position 3: 13.3 sec, position 4: 13.3 sec (30sec in each position demonstrates equal weighting of balance systems), TUG of 17.4sec (>12sec indicates increased risk for falls), and 5x sit to stand of 66 sec (>15sec indicates increased risk for falls and decreased BLE power). Patient will benefit from skilled physical therapy services to help reach the maximal level of functional independence and mobility. Pt demonstrates understanding of this plan of care  and is in agreement with this plan.  We discussed his hip pain, most of it is due to his RA, but improving overall mobility and  strength may also help decrease pain as well.  He may also be externally rotating at the hips to compensate for tightness in ankles, so improving mobility will also address this as well.     OBJECTIVE IMPAIRMENTS: Abnormal gait, decreased activity tolerance, decreased endurance, decreased mobility, difficulty walking, decreased ROM, decreased strength, hypomobility, impaired perceived functional ability, increased muscle spasms, impaired flexibility, postural dysfunction, and pain.   ACTIVITY LIMITATIONS: carrying, lifting, bending, sitting, standing, squatting, sleeping, stairs, transfers, and locomotion level  PARTICIPATION LIMITATIONS: meal prep, cleaning, laundry, driving, shopping, community activity, and yard work  PERSONAL FACTORS: Age, Past/current experiences, Time since onset of injury/illness/exacerbation, and 3+ comorbidities: OA, RA, CKD, CAD, Afib, HTN, PAD, chronic fatigue, Barette's esophagus, GERD, Bipolar 1, 2, long term lithium use, anemia, tremor, unstable angina (has nitroglycerin)  are also affecting patient's functional outcome.   REHAB POTENTIAL: Good  CLINICAL DECISION MAKING: Evolving/moderate complexity  EVALUATION COMPLEXITY: Moderate   GOALS: Goals reviewed with patient? Yes  SHORT TERM GOALS: Target date: 02/16/2024  Patient will be independent with initial HEP. Baseline:  Goal status: INITIAL  LONG TERM GOALS: Target date: 04/05/2024   Patient will be independent with advanced/ongoing HEP to improve outcomes and carryover.  Baseline:  Goal status: INITIAL  2.  Patient will report 50% improvement in bil hip soreness with activity.  Baseline:  Goal status: INITIAL   3.  Patient will demonstrate improved functional LE strength as demonstrated by 5x STS < 45 seconds. Baseline: 66 seconds with UE assist Goal status: INITIAL  4.  Patient will demonstrate at least 19/24 on DGI to improve gait stability and reduce risk for falls. Baseline: NT Goal  status: INITIAL  5.  Patient will score at least 45/56 on Berg Balance test to demonstrate lower risk of falls. (MCID= 8 points) .  Baseline: NT Goal status: INITIAL  6.  Patient will report >50% on ABC scale to demonstrate improved functional ability. Baseline: 30.6% Goal status: INITIAL  PLAN:  PT FREQUENCY: 1-2x/week  PT DURATION: 10 weeks  PLANNED INTERVENTIONS: 97110-Therapeutic exercises, 97530- Therapeutic activity, [id number]- Neuromuscular re-education, [phone]- Self Care, 19147- Manual therapy, [phone]- Gait training, [phone]- Orthotic Fit/training, [phone]- Electrical stimulation (unattended), [phone]- Electrical stimulation (manual), [id number]- Ultrasound, [id number]- Ionotophoresis 4mg /ml Dexamethasone, Patient/Family education, Balance training, Stair training, Taping, Dry Needling, Joint mobilization, Spinal mobilization, Cryotherapy, and Moist heat  PLAN FOR NEXT SESSION: DGI or BERG, check ankle ROM, start with OTAGO exercises   Jena Gauss, PT, DPT 01/26/2024, 2:39 PM

## 2024-02-04 ENCOUNTER — Ambulatory Visit: Payer: PPO

## 2024-02-04 DIAGNOSIS — R2689 Other abnormalities of gait and mobility: Secondary | ICD-10-CM | POA: Diagnosis not present

## 2024-02-04 DIAGNOSIS — M6281 Muscle weakness (generalized): Secondary | ICD-10-CM

## 2024-02-04 DIAGNOSIS — M25551 Pain in right hip: Secondary | ICD-10-CM

## 2024-02-04 DIAGNOSIS — R2681 Unsteadiness on feet: Secondary | ICD-10-CM

## 2024-02-04 NOTE — Therapy (Addendum)
 OUTPATIENT PHYSICAL THERAPY LOWER EXTREMITY TREATMENT   Patient Name: Noah Grant MRN: [id number] DOB:[birth year], 73 y.o., male Today's Date: 02/04/2024  END OF SESSION:  PT End of Session - 02/04/24 1319     Visit Number 2    Date for PT Re-Evaluation 04/05/24    Authorization Type HTA - Medicare guidelines    Progress Note Due on Visit 10    PT Start Time 1315    PT Stop Time 1400    PT Time Calculation (min) 45 min    Activity Tolerance Patient tolerated treatment well    Behavior During Therapy [facility] for tasks assessed/performed              Past Medical History:  Diagnosis Date   Abdominal aortic atherosclerosis (HCC)    Anemia    likely of chronic disease   Angina pectoris (HCC) 08/30/2020   Arthritis    rheumatoid   Atrial fibrillation (HCC)    Atrial fibrillation with rapid ventricular response (HCC) 05/12/2022   06/20/23 - pt states he's not had any A-fib   Barrett's esophagus without dysplasia 03/25/2019   Formatting of this note might be different from the original. EGD done in 02/2019. Due in 3 years.   Benign prostatic hyperplasia with nocturia 12/29/2018   Bipolar 1 disorder (HCC)    Bipolar 1 disorder, mixed, moderate (HCC) 08/16/2014   Bipolar 2 disorder (HCC) 08/16/2014   Bipolar affective disorder, currently depressed, moderate (HCC) 03/08/2020   Bipolar depression (HCC) 08/16/2014   BPH with urinary obstruction 12/29/2018   Brain ischemia 03/08/2020   Cardiac murmur 08/30/2020   Chest pain 09/06/2020   Chicken pox    Chronic bronchitis (HCC)    Chronic kidney disease, stage 3a (HCC) 07/21/2019   Cognitive complaints 03/08/2020   Coronary artery disease involving native coronary artery of native heart 09/06/2020   Coronary artery disease involving native coronary artery of native heart with angina pectoris (HCC) 09/06/2020   Depression    Disc degeneration, 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   Drug therapy 05/18/2021   Elevated troponin level not due  myocardial infarction 05/11/2022   Essential hypertension    GAD (generalized anxiety disorder) 11/02/2018   Gastroesophageal reflux disease 08/16/2014   Formatting of this note might be different from the original. Last Assessment & Plan:  Well controlled. Continue current medications.   GERD without esophagitis 08/16/2014   Formatting of this note might be different from the original. Last Assessment & Plan:  Well controlled. Continue current medications.   Herpes gingivostomatitis 02/10/2015   Hiatal hernia    High risk medication use 03/23/2018   History of colon polyps 03/25/2019   Formatting of this note might be different from the original. tubular adenoma   History of stroke 11/10/2019   Hyperlipidemia    Hyperphosphatemia 10/30/2022   Hypertension    Ingrowing nail 03/12/2020   Intention tremor 06/16/2019   Labile hypertension 08/16/2014   Formatting of this note might be different from the original. Last Assessment & Plan:  Slightly above goal today secondary to pain. Will continue current regimen. Will check CMP today.   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 Concern over possible lichen planus. His dentist noticed lesions of his oral mucosa.  It was felt to be lichen planus.  He presents to discuss further.  Rarely does the area hurt.He smoked in the distant past EXAM shows  several white patches on the buccal mucosa bilaterally.  No other concerning oral mucosal les   Lithium use 10/30/2022   Long-term use of immunosuppressant medication 03/23/2018   Low testosterone 09/17/2016   Midline thoracic back pain 02/10/2015   Mixed hyperlipidemia 08/16/2014   Myocarditis (HCC) 05/14/2022   Normochromic normocytic anemia 08/16/2014   Normocytic anemia    likely of chronic disease   PAD (peripheral artery disease) (HCC) 03/23/2019   Pain of right hip joint 01/19/2016    Pericarditis 05/12/2022   Rheumatic fever    Rheumatoid arthritis (HCC) 10/29/2018   Formatting of this note might be different from the original. Rheum:  Dr. Sharmon Revere Formerly on MTX - stopped due to mouth sores. Currently started on Arava.  Also on 5 mg prednisone x 3 months during run-in.   Right-sided chest wall pain 04/17/2019   Seasonal allergies 03/23/2018   Sepsis (HCC) 05/11/2022   Stasis dermatitis of both legs 07/29/2022   Statin intolerance 07/31/2020   Stroke (HCC)    3 - TIA's   Task-specific dystonia of hand    right hand   Thoracic radiculopathy 06/16/2019   Vitamin B12 deficiency    Injections   Vitamin D deficiency 09/17/2016   Past Surgical History:  Procedure Laterality Date   CLAVICLE SURGERY     Right, Hardware placed   CORONARY STENT INTERVENTION N/A 09/06/2020   Procedure: CORONARY STENT INTERVENTION;  Surgeon: Tonny Bollman, MD;  Location: [facility] INVASIVE CV LAB;  Service: Cardiovascular;  Laterality: N/A;   FALSE ANEURYSM REPAIR Right 06/23/2023   Procedure: RIGHT ULNAR ARTERY PSEUDOANEURYSM REPAIR;  Surgeon: [name], MD;  Location: [facility] OR;  Service: Vascular;  Laterality: Right;   ingrown toenail Bilateral    great toes   LEFT HEART CATH AND CORONARY ANGIOGRAPHY N/A 09/06/2020   Procedure: LEFT HEART CATH AND CORONARY ANGIOGRAPHY;  Surgeon: Tonny Bollman, MD;  Location: [facility] INVASIVE CV LAB;  Service: Cardiovascular;  Laterality: N/A;   LEFT HEART CATH AND CORONARY ANGIOGRAPHY N/A 06/06/2023   Procedure: LEFT HEART CATH AND CORONARY ANGIOGRAPHY;  Surgeon: Marykay Lex, MD;  Location: [facility] INVASIVE CV LAB;  Service: Cardiovascular;  Laterality: N/A;   WISDOM TOOTH EXTRACTION     Patient Active Problem List   Diagnosis Date Noted   Chronic fatigue 12/21/2023   Trigger finger, left middle finger 09/30/2023   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   Hyperphosphatemia 10/30/2022   Lithium use 10/30/2022   Anemia 09/02/2022   Atrial fibrillation (HCC)  09/02/2022   Stasis dermatitis of both legs 07/29/2022   Myocarditis (HCC) 05/14/2022   Pericarditis 05/12/2022   Atrial fibrillation with rapid ventricular response (HCC) 05/12/2022   Sepsis (HCC) 05/11/2022   Elevated troponin level not due myocardial infarction 05/11/2022   Lactic acidosis 05/11/2022   Drug therapy 05/18/2021   Normocytic anemia    Arthritis    Bipolar 1 disorder (HCC)    Chicken pox    Chronic bronchitis (HCC)    Depression    Hyperlipidemia with target low density lipoprotein (LDL) cholesterol less than 55 mg/dL    Rheumatic fever    Essential hypertension    Chest pain 09/06/2020   Coronary artery disease involving native coronary artery of native heart with angina pectoris (HCC) 09/06/2020   Angina pectoris (HCC) 08/30/2020   Cardiac murmur 08/30/2020   Statin intolerance 07/31/2020   Abdominal aortic atherosclerosis (HCC)    Ingrowing nail 03/12/2020   Bipolar affective disorder, currently depressed, moderate (HCC) 03/08/2020  Brain ischemia 03/08/2020   Cognitive complaints 03/08/2020   History of stroke 11/10/2019   Chronic kidney disease, stage 3a (HCC) 07/21/2019   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   Thoracic radiculopathy 06/16/2019   Intention tremor 06/16/2019   Right-sided chest wall pain 04/17/2019   Barrett's esophagus without dysplasia 03/25/2019   History of colon polyps 03/25/2019   PAD (peripheral artery disease) (HCC) 03/23/2019   Benign prostatic hyperplasia with nocturia 12/29/2018   BPH with urinary obstruction 12/29/2018   GAD (generalized anxiety disorder) 11/02/2018   Rheumatoid arthritis (HCC) 10/29/2018   Lichen planus 04/07/2018   High risk medication use 03/23/2018   Seasonal allergies 03/23/2018   Long-term use of immunosuppressant medication 03/23/2018   Low testosterone 09/17/2016   Vitamin B12 deficiency 09/17/2016   Vitamin D deficiency 09/17/2016   Disc degeneration, 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   Pain of right hip  joint 01/19/2016   Herpes gingivostomatitis 02/10/2015   Midline thoracic back pain 02/10/2015   Normochromic normocytic anemia 08/16/2014   Bipolar 1 disorder, mixed, moderate (HCC) 08/16/2014   Gastroesophageal reflux disease 08/16/2014   Bipolar depression (HCC) 08/16/2014   GERD without esophagitis 08/16/2014   Bipolar 2 disorder (HCC) 08/16/2014    PCP: Sharlene Dory, DO    REFERRING PROVIDER: Fuller Plan, MD  REFERRING DIAG: [phone] (ICD-10-CM) - Bilateral hip pain   THERAPY DIAG:  Other abnormalities of gait and mobility  Unsteadiness on feet  Muscle weakness (generalized)  Pain of both hip joints  Rationale for Evaluation and Treatment: Rehabilitation  ONSET DATE: progressively worsening over years  SUBJECTIVE:   SUBJECTIVE STATEMENT: Pt reports having pain everyday, walking up an incline is the worst because it makes him feel off balance.  PERTINENT HISTORY: OA, RA, CKD, CAD, Afib, HTN, PAD, chronic fatigue, Barette's esophagus, GERD, Bipolar 1, 2, long term lithium use, anemia, tremor, unstable angina (has nitroglycerin) PAIN:  Are you having pain? Yes: NPRS scale: 6-7/10 Pain location: generalized pain hips and shoulders bilaterally Pain description: ache Aggravating factors: sitting too long, sleeping on shoulders Relieving factors: moving around  PRECAUTIONS: Other: unstable angina, has nitroglycerin  RED FLAGS: None   WEIGHT BEARING RESTRICTIONS: No  FALLS: Has patient fallen in last 6 months? no falls but feels off balance  LIVING ENVIRONMENT: Lives with: lives with their spouse Lives in: House/apartment Stairs: No  1 low step to front door Has following equipment at home: Single point cane  OCCUPATION: retired  PLOF: Independent with community mobility with device  PATIENT GOALS: improve walking so not as sore  NEXT MD VISIT: 03/22/24 with Dr. Carmelia Roller, 03/30/24 with Dr. Dimple Casey  OBJECTIVE:  Note: Objective  measures were completed at Evaluation unless otherwise noted.  DIAGNOSTIC FINDINGS:  04/19/2022 X-ray bilateral hips and pelvis   Visualized portion of the lumbar spine appears to have some lateral  osteophyte formation.  Minimal femoral acetabular joint change few small  cysts are present.  Bone mineralization appears normal no erosions or  other abnormal calcifications seen.   PATIENT SURVEYS:  ABC scale 490/1600 = 30.6% LEFS 17/80 = 21.25%  COGNITION: Overall cognitive status: Within functional limits for tasks assessed     POSTURE: rounded shoulders and forward head  - noted pill rolling tremor in right hand   LOWER EXTREMITY MMT:  MMT Right eval Left eval  Hip flexion 3+ 3+  Hip extension    Hip abduction 4+ 4+  Hip adduction 3+ 3+  Knee flexion 4+ 4+  Knee extension 4+ 4+  Ankle dorsiflexion 4 4  Ankle plantarflexion     (Blank rows = not tested)  LOWER EXTREMITY ROM:  Not tested formally today.  Noted stiffness with all movement in LE.  R ankle: EV- 24 deg, IV: 5 deg, PF: 45 deg, DF: -10 deg L ankle: EV- 15 deg, IV: 10 deg, PF: 45 deg, DF: -7 deg  FUNCTIONAL TESTS:  5 times sit to stand: 1 min 6 sec Timed up and go (TUG): 17.38 seconds with SPC MCTSIB: Condition 1: Avg of 3 trials: 30 sec, Condition 2: Avg of 3 trials: 30 sec, Condition 3: Avg of 3 trials: 13.3 sec, Condition 4: Avg of 3 trials: 13.3 sec, and Total Score: 86.6/120  GAIT: Distance walked: 82' Assistive device utilized: Single point cane Level of assistance: Modified independence Comments: hips externally rotated, decreased gait speed, 0.75 m/s                                                                                                                                 TREATMENT DATE:  02/04/24 Therapeutic Exercise: to improve strength and mobility.  Demo, verbal and tactile cues throughout for technique.  Nustep L3x63min UE/LE  [facility] PT Assessment - 02/04/24 0001       Standardized  Balance Assessment   Standardized Balance Assessment Dynamic Gait Index      Dynamic Gait Index   Level Surface Moderate Impairment    Change in Gait Speed Moderate Impairment    Gait with Horizontal Head Turns Mild Impairment    Gait with Vertical Head Turns Moderate Impairment    Gait and Pivot Turn Mild Impairment    Step Over Obstacle Moderate Impairment    Step Around Obstacles Mild Impairment    Steps Moderate Impairment    Total Score 11           DGI Ankle ROM Seated heel/toe lifts x 10  OTAGO program: Standing head turns x 5  Standing chin tucks x 5 Standing trunk extension x 5 Standing trunk rotation x5  01/26/24 EVAL Self Care: Findings, POC, importance of mobility and building strength to decrease muscle fatigue which may be partially contributing to pain.  Education on form with sit to stand ("nose over toes") to improve ability to transition better.    Checked cane height by request, it was adjusted correctly.     PATIENT EDUCATION:  Education details: see self care Person educated: Patient Education method: Explanation Education comprehension: verbalized understanding  HOME EXERCISE PROGRAM: TBD  ASSESSMENT:  CLINICAL IMPRESSION:  Pt shows high fall risk based on DGI score. Ankle ROM is very limited bilaterally, he showed a clonus type of resistance with ROM assessments. Started OTAGO program but ran out of time before being able to get into strengthening. Cues required for upright posture with the exercises.    OBJECTIVE IMPAIRMENTS: Abnormal gait, decreased activity tolerance, decreased endurance, decreased mobility, difficulty walking, decreased ROM, decreased strength, hypomobility, impaired  perceived functional ability, increased muscle spasms, impaired flexibility, postural dysfunction, and pain.   ACTIVITY LIMITATIONS: carrying, lifting, bending, sitting, standing, squatting, sleeping, stairs, transfers, and locomotion level  PARTICIPATION  LIMITATIONS: meal prep, cleaning, laundry, driving, shopping, community activity, and yard work  PERSONAL FACTORS: Age, Past/current experiences, Time since onset of injury/illness/exacerbation, and 3+ comorbidities: OA, RA, CKD, CAD, Afib, HTN, PAD, chronic fatigue, Barette's esophagus, GERD, Bipolar 1, 2, long term lithium use, anemia, tremor, unstable angina (has nitroglycerin)  are also affecting patient's functional outcome.   REHAB POTENTIAL: Good  CLINICAL DECISION MAKING: Evolving/moderate complexity  EVALUATION COMPLEXITY: Moderate   GOALS: Goals reviewed with patient? Yes  SHORT TERM GOALS: Target date: 02/16/2024  Patient will be independent with initial HEP. Baseline:  Goal status: IN PROGRESS  LONG TERM GOALS: Target date: 04/05/2024   Patient will be independent with advanced/ongoing HEP to improve outcomes and carryover.  Baseline:  Goal status: IN PROGRESS  2.  Patient will report 50% improvement in bil hip soreness with activity.  Baseline:  Goal status: IN PROGRESS   3.  Patient will demonstrate improved functional LE strength as demonstrated by 5x STS < 45 seconds. Baseline: 66 seconds with UE assist Goal status: IN PROGRESS  4.  Patient will demonstrate at least 19/24 on DGI to improve gait stability and reduce risk for falls. Baseline: NT Goal status: IN PROGRESS  5.  Patient will score at least 45/56 on Berg Balance test to demonstrate lower risk of falls. (MCID= 8 points) .  Baseline: NT Goal status: IN PROGRESS  6.  Patient will report >50% on ABC scale to demonstrate improved functional ability. Baseline: 30.6% Goal status: IN PROGRESS  PLAN:  PT FREQUENCY: 1-2x/week  PT DURATION: 10 weeks  PLANNED INTERVENTIONS: 97110-Therapeutic exercises, 97530- Therapeutic activity, [id number]- Neuromuscular re-education, [phone]- Self Care, 86578- Manual therapy, [phone]- Gait training, [phone]- Orthotic Fit/training, [phone]- Electrical stimulation (unattended),  [phone]- Electrical stimulation (manual), [id number]- Ultrasound, [id number]- Ionotophoresis 4mg /ml Dexamethasone, Patient/Family education, Balance training, Stair training, Taping, Dry Needling, Joint mobilization, Spinal mobilization, Cryotherapy, and Moist heat  PLAN FOR NEXT SESSION: OTAGO exercises; ankle ROM   Jaymison Luber L Berklee Battey, PTA 02/04/2024, 2:04 PM  PHYSICAL THERAPY DISCHARGE SUMMARY  Visits from Start of Care: 2  Current functional level related to goals / functional outcomes: See above   Remaining deficits: See above   Education / Equipment: HEP  Plan: Patient is being discharged due cancelling remaining visits stating he is planning on having surgery, will request new order when ready to return to PT.    Jena Gauss, PT  03/03/2024 2:36 PM

## 2024-02-09 DIAGNOSIS — K2270 Barrett's esophagus without dysplasia: Secondary | ICD-10-CM | POA: Diagnosis not present

## 2024-02-09 DIAGNOSIS — K5909 Other constipation: Secondary | ICD-10-CM | POA: Diagnosis not present

## 2024-02-09 DIAGNOSIS — R1011 Right upper quadrant pain: Secondary | ICD-10-CM | POA: Diagnosis not present

## 2024-02-09 DIAGNOSIS — N1832 Chronic kidney disease, stage 3b: Secondary | ICD-10-CM | POA: Diagnosis not present

## 2024-02-09 DIAGNOSIS — K219 Gastro-esophageal reflux disease without esophagitis: Secondary | ICD-10-CM | POA: Diagnosis not present

## 2024-02-09 DIAGNOSIS — Z860100 Personal history of colon polyps, unspecified: Secondary | ICD-10-CM | POA: Diagnosis not present

## 2024-02-12 ENCOUNTER — Ambulatory Visit: Payer: PPO | Admitting: Physical Therapy

## 2024-02-16 ENCOUNTER — Ambulatory Visit: Payer: PPO

## 2024-02-17 ENCOUNTER — Other Ambulatory Visit: Payer: Self-pay | Admitting: Family Medicine

## 2024-02-18 ENCOUNTER — Encounter: Payer: PPO | Admitting: Physical Therapy

## 2024-02-18 DIAGNOSIS — K3189 Other diseases of stomach and duodenum: Secondary | ICD-10-CM | POA: Diagnosis not present

## 2024-02-18 DIAGNOSIS — K449 Diaphragmatic hernia without obstruction or gangrene: Secondary | ICD-10-CM | POA: Diagnosis not present

## 2024-02-18 DIAGNOSIS — K2270 Barrett's esophagus without dysplasia: Secondary | ICD-10-CM | POA: Diagnosis not present

## 2024-02-18 DIAGNOSIS — K219 Gastro-esophageal reflux disease without esophagitis: Secondary | ICD-10-CM | POA: Diagnosis not present

## 2024-02-18 DIAGNOSIS — R1011 Right upper quadrant pain: Secondary | ICD-10-CM | POA: Diagnosis not present

## 2024-02-19 ENCOUNTER — Encounter: Payer: Self-pay | Admitting: Physician Assistant

## 2024-02-19 ENCOUNTER — Ambulatory Visit: Payer: PPO | Attending: Physician Assistant | Admitting: Physician Assistant

## 2024-02-19 VITALS — BP 136/54 | HR 63 | Ht 67.0 in | Wt 168.0 lb

## 2024-02-19 DIAGNOSIS — M79605 Pain in left leg: Secondary | ICD-10-CM

## 2024-02-19 DIAGNOSIS — I514 Myocarditis, unspecified: Secondary | ICD-10-CM | POA: Diagnosis not present

## 2024-02-19 DIAGNOSIS — R0989 Other specified symptoms and signs involving the circulatory and respiratory systems: Secondary | ICD-10-CM | POA: Diagnosis not present

## 2024-02-19 DIAGNOSIS — M069 Rheumatoid arthritis, unspecified: Secondary | ICD-10-CM | POA: Diagnosis not present

## 2024-02-19 DIAGNOSIS — M79604 Pain in right leg: Secondary | ICD-10-CM | POA: Diagnosis not present

## 2024-02-19 DIAGNOSIS — I480 Paroxysmal atrial fibrillation: Secondary | ICD-10-CM | POA: Diagnosis not present

## 2024-02-19 DIAGNOSIS — N1832 Chronic kidney disease, stage 3b: Secondary | ICD-10-CM | POA: Diagnosis not present

## 2024-02-19 DIAGNOSIS — I10 Essential (primary) hypertension: Secondary | ICD-10-CM

## 2024-02-19 DIAGNOSIS — I2510 Atherosclerotic heart disease of native coronary artery without angina pectoris: Secondary | ICD-10-CM | POA: Diagnosis not present

## 2024-02-19 NOTE — Progress Notes (Unsigned)
 Cardiology Office Note:  .   Date:  02/19/2024  ID:  Noah Grant, DOB 1951/10/24, MRN [id number] PCP: Noah Dory, DO  [facility] Providers Cardiologist:  Noah Lemma, MD Electrophysiologist:  Noah Manges, MD { Click to update primary MD,subspecialty MD or APP then REFRESH:1}   History of Present Illness: .   Noah Grant is a 73 y.o. male with past medical history of CAD s/p DES x 2 to rPDA 08/2020, PAF, myocarditis, bipolar 1 disorder, rheumatoid arthritis, hypertension, hyperlipidemia, CKD stage III, and TIA/CVA. He has a longstanding history of bradycardia.  He was previously referred to Dr. Graciela Husbands due to concern by his nephrologist and rheumatologist that his bradycardia was contributing to weakness and worsening renal function.  He was admitted in June 2023 with weakness, tremors, fever and abdominal pain concerning for sepsis.  He was diagnosed with myopericarditis by symptoms and a cardiac MRI.  Cardiac MRI demonstrated mid wall basal inferolateral LGE and elevated T2 consistent with myocarditis.  He was placed on 44-month course of colchicine.  He also had atrial fibrillation with RVR. Based on Dr. Odessa Grant last note in January 2024, he had improvement in his heart rate with the decrease of metoprolol dosage.  Metoprolol was discontinued during that visit.  Echocardiogram obtained on 01/02/2023 showed EF 60 to 65%, no regional wall motion abnormality, moderate LVH, mild MR.  Subsequent heart monitor in February 2024 demonstrated minimal heart rate 36, maximal heart rate 120, average heart rate 56, less than 1% PVCs. Patient underwent cardiac catheterization on 05/29/2023 which showed widely patent overlapping stents in the RPDA, 60% disease in RPDA unchanged when compared to the previous image, minimal to mild residual CAD.  Medical therapy was recommended.  Ranexa 500 mg twice a day was added.   He returned back to the emergency room on 06/13/2023 due to swelling and  tenderness in the wrist area.  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 I saw the patient on 06/20/2023, right upper extremity arterial duplex obtained on the same day showed right ulnar artery pseudoaneurysm enlarged to 3.1 x 1.42 x 1.3 cm.  Patient ultimately underwent right ulnar artery pseudoaneurysm repair by Dr. Nolon Grant on 06/23/2023.  Postprocedure, he was instructed to take aspirin for 2 days before transition back to Eliquis.  He did require a course of Augmentin on follow-up due to skin irritation.  During the last visit, he was quite adamant he does not wish to restart Eliquis.  He is aware that coming off of Eliquis will increase the chance of stroke because his history of A-fib.  I left him on aspirin at the time.  He also wished to defer blood pressure management with Dr. Madaline Grant his nephrologist at [facility].  He expressed wish to switch from Dr. Tomie Grant to Dr. Herbie Grant.  He was last seen by Dr. Herbie Grant in January 2025.  During the visit, patient reported occasional swelling in the lower extremity and feet particularly at night.  His blood pressure was elevated, Imdur 30 mg daily was added to help control his blood pressure.  Patient presents today for follow-up.  He could not tolerate Imdur as it made him extremely weak.  He had a similar reaction in the past.  He has since stopped the Imdur.  His blood pressure at home is well-controlled.  Blood pressure here today is borderline high.  He denies  any significant shortness of breath.  His chest discomfort is not consistent with exertion, sometimes with occurs twice a week, other times may not occur for several weeks at a time.  He does complain of weakness in his thigh after walking long distance, given his prior history, he would be at risk for PAD, I will obtain ABI.  Otherwise, he can follow-up with Dr. Herbie Grant in June as previously  scheduled.  ROS: ***  Studies Reviewed: .        *** Risk Assessment/Calculations:   {Does this patient have ATRIAL FIBRILLATION?[phone]}         Physical Exam:   VS:  BP (!) 136/54 (BP Location: Left Arm, Patient Position: Sitting, Cuff Size: Normal)   Pulse 63   Ht 5\' 7"  (1.702 m)   Wt 168 lb (76.2 kg)   SpO2 97%   BMI 26.31 kg/m    Wt Readings from Last 3 Encounters:  02/19/24 168 lb (76.2 kg)  12/31/23 167 lb (75.8 kg)  12/29/23 171 lb 6.4 oz (77.7 kg)    GEN: Well nourished, well developed in no acute distress NECK: No JVD; No carotid bruits CARDIAC: ***RRR, no murmurs, rubs, gallops RESPIRATORY:  Clear to auscultation without rales, wheezing or rhonchi  ABDOMEN: Soft, non-tender, non-distended EXTREMITIES:  No edema; No deformity   ASSESSMENT AND PLAN: .   ***    {Are you ordering a CV Procedure (e.g. stress test, cath, DCCV, TEE, etc)?   Press F2        :[id number]}  Dispo: ***  Signed, Azalee Course, PA

## 2024-02-19 NOTE — Patient Instructions (Signed)
 Medication Instructions:  DISCONTINUED ISOSORBIDE MONONITRATE  *If you need a refill on your cardiac medications before your next appointment, please call your pharmacy*   Lab Work: NO LABS If you have labs (blood work) drawn today and your tests are completely normal, you will receive your results only by: MyChart Message (if you have MyChart) OR A paper copy in the mail If you have any lab test that is abnormal or we need to change your treatment, we will call you to review the results.   Testing/Procedures:[address] Your physician has requested that you have an ankle brachial index (ABI). During this test an ultrasound and blood pressure cuff are used to evaluate the arteries that supply the arms and legs with blood. Allow thirty minutes for this exam. There are no restrictions or special instructions.  Please note: We ask at that you not bring children with you during ultrasound (echo/ vascular) testing. Due to room size and safety concerns, children are not allowed in the ultrasound rooms during exams. Our front office staff cannot provide observation of children in our lobby area while testing is being conducted. An adult accompanying a patient to their appointment will only be allowed in the ultrasound room at the discretion of the ultrasound technician under special circumstances. We apologize for any inconvenience.    Follow-Up: At [facility], you and your health needs are our priority.  As part of our continuing mission to provide you with exceptional heart care, we have created designated Provider Care Teams.  These Care Teams include your primary Cardiologist (physician) and Advanced Practice Providers (APPs -  Physician Assistants and Nurse Practitioners) who all work together to provide you with the care you need, when you need it.  Your next appointment:   KEEP FOLLOW UP JUNE 2025   Provider:   Bryan Lemma, MD   Other Instructions

## 2024-02-23 ENCOUNTER — Encounter: Payer: PPO | Admitting: Physical Therapy

## 2024-02-27 ENCOUNTER — Ambulatory Visit (HOSPITAL_COMMUNITY)
Admission: RE | Admit: 2024-02-27 | Discharge: 2024-02-27 | Disposition: A | Discharge status: Home / Self Care | Source: Ambulatory Visit | Attending: Physician Assistant | Admitting: Physician Assistant

## 2024-02-27 ENCOUNTER — Other Ambulatory Visit (HOSPITAL_BASED_OUTPATIENT_CLINIC_OR_DEPARTMENT_OTHER): Payer: Self-pay | Admitting: Physician Assistant

## 2024-02-27 DIAGNOSIS — I739 Peripheral vascular disease, unspecified: Secondary | ICD-10-CM

## 2024-02-27 DIAGNOSIS — M79605 Pain in left leg: Secondary | ICD-10-CM | POA: Diagnosis not present

## 2024-02-27 DIAGNOSIS — M79604 Pain in right leg: Secondary | ICD-10-CM | POA: Diagnosis not present

## 2024-02-27 LAB — VAS US ABI WITH/WO TBI
Left ABI: 1.3
Right ABI: 1.24

## 2024-02-27 NOTE — Progress Notes (Signed)
 Normal blood flow in both legs without sign of significant blockage

## 2024-03-08 ENCOUNTER — Ambulatory Visit: Payer: PPO | Admitting: Psychiatry

## 2024-03-08 ENCOUNTER — Encounter: Payer: Self-pay | Admitting: Psychiatry

## 2024-03-08 DIAGNOSIS — F411 Generalized anxiety disorder: Secondary | ICD-10-CM

## 2024-03-08 DIAGNOSIS — R251 Tremor, unspecified: Secondary | ICD-10-CM

## 2024-03-08 DIAGNOSIS — N1831 Chronic kidney disease, stage 3a: Secondary | ICD-10-CM | POA: Diagnosis not present

## 2024-03-08 DIAGNOSIS — Z79899 Other long term (current) drug therapy: Secondary | ICD-10-CM | POA: Diagnosis not present

## 2024-03-08 DIAGNOSIS — F3132 Bipolar disorder, current episode depressed, moderate: Secondary | ICD-10-CM

## 2024-03-08 MED ORDER — LITHIUM CARBONATE 150 MG PO CAPS: 450.0000 mg | ORAL_CAPSULE | Freq: Every day | ORAL | 1 refills | Status: DC

## 2024-03-08 NOTE — Progress Notes (Signed)
 Noah Grant [id number] 10/19/1951 73 y.o.  Subjective:   Patient ID:  Noah Grant is a 73 y.o. (DOB May 19, 1951) male.  Chief Complaint:  Chief Complaint  Patient presents with   Follow-up    Mood and anxiety    Noah Grant presents to the office today for follow-up of bipolar, GAD, poor STM.  seen May 18, 2019 and 11/17/2019 without med changes.    05/17/2020 appointment with the following noted: Word recall problems with neuropsych testing by Dr. Orie Grant inconclusive results.  Test reviewed. Still tremor can interfere with eating. Nocturia Dt overactive bladder. Overall mentally ok but too many medical concerns with RA and muscle pain.   Chronic pain issues ongoing.   Htn controlled.  Plan: Lithium level from January 2021 0.8 on 600 mg daily. Gradually it's crept up.  Disc in detail it's relation to renal function and Cr has increased gradually. BC tremor & jerks reduce to 450 mg daily.  02/12/2021 appointment with the following noted: Reduced ithium with no change in tremor. End of December, CVD stent placement and reaction to contrast dye.   Cardiologists ask if he can come off CBZ.   DT DDI. In the last year has felet mentally better than ever.   Plan: Reduce carbamazepine by 1/2 tablet every week until off of it.  04/09/2021 appointment with the following noted: Off CBZ and didn't have any problems.  No worse for mood or anxiety.  Never slept good in life.  Melatonin caused dizziness. Checked lithium level. 06  No change in tremors in a long time.  Maninly noticeable with fine motor control. Depression is somewhat chronic and waxes and wanes dependent on health issues.  Avoids stressors if possible.  Plan no med changes  10/10/21 appt noted: No Covid.  Hx TIA's.  Tremor seems a little worse but also has RA and started infusions last week. Continues lithium 450 mg HS as only psych med. Patient reports stable mood and denie irritable moods except as noted.   Patient denies any recent difficulty with anxiety except situational.  Patient denies unusual difficulty with sleep initiation or maintenance except awakens with pain chronically. Denies appetite disturbance.  Patient reports that energy and motivation have been good.  Patient denies any difficulty with concentration  But history of word finding from ministrokes..  Patient denies any suicidal ideation.  04/09/22 appt noted: Doing well overall.  RA is a lot worse and changing rheumatologist.  Is tired and anemic with the meds. Mood overall is stable and pleased.  Deaf in left ear.  Tried hearing aids and overall minimal improvement.   Continues  lithium 450 mg HS as only psych med. Tremor is some worse. History TIA's or ministrokes may be contributing.  10/10/22 appt noted: Continues  lithium 450 mg HS as only psych med. 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 Wife survived breast CA. Decline in kidney function.  Pending FU in November. Mood has been ok.  Anxiety is manageable. Tremor is worse and worse as day progresses  03/04/23 appt noted: Continues  lithium 450 mg HS as only psych med. On Xarelto but wants to get off it. Nephro notes Cr 1.4 up to 1.8 07/2022; last 01/29/23 =1.66.  Rec he disc alternative to lithium.   Patient reports stable mood and denies depressed or irritable moods.  Patient denies any recent difficulty with anxiety.  Patient denies difficulty with sleep initiation or maintenance. Denies appetite disturbance.  Patient reports that  energy and motivation have been good.  Patient denies any difficulty with concentration.  Patient denies any suicidal ideation. Card Noah Grant, [place] Plan: Continue lithium 450 mg daily but tremor did not get better with reduction last visit. Lithium level 10/18/21 = 0.6, 05/12/22 lithium =0.45 Disc alternative Trileptal but DDI   05/07/23 appt noted: Psych med:  lithium 450 mg HS as only psych med. Last Cr better at  1.36. Disc pros and cons of changing lithium and risk of relapse.   Mood is OK and without much change. Has RA and waxes and wanes and causes fatigue. Won't be given oral steroids.   Still has tremor but better than it was.    09/08/23 appt noted: Psych med:  lithium CR 450 mg HS level 0.6 in June Cr is better .    Had heart Cath was ok with no new stents but has 2. May have small vessel heart dz.   Mood has been ok without major changes. Fatigue from either RA or heart. Sleep is not good mostly DT pain in hips causing awakening.   No SE with lithium.  03/08/24 appt noted: Psych med:  lithium CR 450 mg HS Chronic htn but now controlled and it's too low bc causes fatigue.   RA causes fatigue.   Mood has been fine.   New  px Raynaud's.  Gets bad in mornings.  Turn blue and then ash white.  No mood swings.  Dep and anxiety managed.  Enjoys eating.  Can't walk DT RA.  Can't play golf.   SE tremor varies, worse when fatigue.  Otherwise no change.  No Caffeine. Sleep affected by OAB and hip pain.  But can go back to sleep.  Has seen nephorologists and rheumatologists and neurologist, [facility].   Lives in [place] now  Previous psych med trials are extensive and include meds for anxiety and mood.  These include  clonidine, buspirone,  Abilify 5 mg,  olanzapine, Geodon, Depakote SE, CBZ,  gabapentin sedation , lamotrigine, bupropion, Strattera,  Prozac, Serzone 700 mg daily, sertraline, duloxetine,  Pristiq which caused rage, mirtazapine, Pamelor, amitriptyline 450 mg/day, Ritalin, Provigil,  temazepam,  Ambien, ProSom, trazodone which was ineffective.  Cerefolin NAC,  Thinks he tried propranolol which caused back pain  Review of Systems:  Review of Systems  Constitutional:  Positive for fatigue.  Cardiovascular:  Negative for chest pain and palpitations.  Gastrointestinal:  Positive for abdominal distention.  Musculoskeletal:  Positive for arthralgias and gait problem.   Neurological:  Positive for tremors. Negative for weakness.       Occ jerks  Psychiatric/Behavioral:  Negative for agitation, behavioral problems, confusion, decreased concentration, dysphoric mood, hallucinations, self-injury, sleep disturbance and suicidal ideas. The patient is nervous/anxious. The patient is not hyperactive.     Medications: I have reviewed the patient's current medications.  Current Outpatient Medications  Medication Sig Dispense Refill   acetaminophen (TYLENOL) 650 MG CR tablet Take 1,300 mg by mouth every 8 (eight) hours as needed for pain.     amLODipine (NORVASC) 10 MG tablet Take 1 tablet (10 mg total) by mouth daily. 90 tablet 3   aspirin EC 81 MG tablet Take 1 tablet (81 mg total) by mouth daily. Swallow whole. 30 tablet 12   Cholecalciferol 50 MCG (2000 UT) CAPS Take 2,000 Units by mouth daily.     Coenzyme Q10 (COQ10) 200 MG CAPS Take 200 mg by mouth daily.     cyclobenzaprine (FLEXERIL) 5 MG tablet  Take 1 tablet (5 mg total) by mouth at bedtime as needed. 30 tablet 0   diclofenac Sodium (VOLTAREN) 1 % GEL Apply 1 application  topically daily as needed for pain.     ezetimibe (ZETIA) 10 MG tablet Take 1 tablet (10 mg total) by mouth daily. 90 tablet 1   famotidine (PEPCID) 40 MG tablet Take 1 tablet (40 mg total) by mouth daily. 60 tablet 1   finasteride (PROSCAR) 5 MG tablet TAKE 1 TABLET BY MOUTH DAILY 90 tablet 1   hydrALAZINE (APRESOLINE) 100 MG tablet Take 100 mg by mouth 3 (three) times daily.     leflunomide (ARAVA) 20 MG tablet Take 1 tablet (20 mg total) by mouth daily. 90 tablet 0   Lysine HCl 1000 MG TABS Take 1,000 mg by mouth 2 (two) times daily.      nitroGLYCERIN (NITROSTAT) 0.4 MG SL tablet Place 1 tablet (0.4 mg total) under the tongue every 5 (five) minutes as needed for chest pain. 25 tablet 4   olmesartan (BENICAR) 40 MG tablet Take 1 tablet (40 mg total) by mouth daily. 30 tablet 5   omeprazole (PRILOSEC) 40 MG capsule TAKE 1 CAPSULE BY  MOUTH EVERY MORNING AND TAKE 1 CAPSULE BY MOUTH EVERY NIGHT AT BEDTIME 180 capsule 0   predniSONE (DELTASONE) 5 MG tablet Take 1 tablet (5 mg total) by mouth daily as needed. 30 tablet 2   simvastatin (ZOCOR) 5 MG tablet TAKE 1 TABLET BY MOUTH DAILY 90 tablet 2   triamcinolone cream (KENALOG) 0.1 % Apply 1 Application topically 2 (two) times daily. 30 g 0   vitamin B-12 (CYANOCOBALAMIN) 1000 MCG tablet Take 1,000 mcg by mouth daily.     lithium carbonate 150 MG capsule Take 3 capsules (450 mg total) by mouth daily. 270 capsule 1   No current facility-administered medications for this visit.    Medication Side Effects: None  Allergies:  Allergies  Allergen Reactions   Methotrexate Dermatitis    Developed Mouth Sores   Nsaids Dermatitis    Developed Mouth Blisters   Plaquenil [Hydroxychloroquine] Other (See Comments)    Terrible Nightmares   Azulfidine [Sulfasalazine] Nausea Only   Crestor [Rosuvastatin] Other (See Comments)    Caused abdominal pain that radiated to the back   Iodinated Contrast Media     GI Intolerance, Made him severely sick   Isosorbide Other (See Comments)    Made him feel weak   Mevacor [Lovastatin] Other (See Comments)    Caused abdominal pain that radiated to the back   Pravachol [Pravastatin] Other (See Comments)    Caused abdominal pain that radiated to the back   Ranexa [Ranolazine Er] Nausea Only    Severe nausea   Ranolazine Nausea Only    Severe nausea    Past Medical History:  Diagnosis Date   Abdominal aortic atherosclerosis (HCC)    Anemia    likely of chronic disease   Angina pectoris (HCC) 08/30/2020   Arthritis    rheumatoid   Atrial fibrillation (HCC)    Atrial fibrillation with rapid ventricular response (HCC) 05/12/2022   06/20/23 - pt states he's not had any A-fib   Barrett's esophagus without dysplasia 03/25/2019   Formatting of this note might be different from the original. EGD done in 02/2019. Due in 3 years.   Benign  prostatic hyperplasia with nocturia 12/29/2018   Bipolar 1 disorder (HCC)    Bipolar 1 disorder, mixed, moderate (HCC) 08/16/2014   Bipolar 2 disorder (HCC)  08/16/2014   Bipolar affective disorder, currently depressed, moderate (HCC) 03/08/2020   Bipolar depression (HCC) 08/16/2014   BPH with urinary obstruction 12/29/2018   Brain ischemia 03/08/2020   Cardiac murmur 08/30/2020   Chest pain 09/06/2020   Chicken pox    Chronic bronchitis (HCC)    Chronic kidney disease, stage 3a (HCC) 07/21/2019   Cognitive complaints 03/08/2020   Coronary artery disease involving native coronary artery of native heart 09/06/2020   Coronary artery disease involving native coronary artery of native heart with angina pectoris (HCC) 09/06/2020   Depression    Disc degeneration, 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 Last Assessment & Plan:  Well controlled. Continue current medications.   GERD without esophagitis 08/16/2014   Formatting of this note might be different from the original. Last Assessment & Plan:  Well controlled. Continue current medications.   Herpes gingivostomatitis 02/10/2015   Hiatal hernia    High risk medication use 03/23/2018   History of colon polyps 03/25/2019   Formatting of this note might be different from the original. tubular adenoma   History of stroke 11/10/2019   Hyperlipidemia    Hyperphosphatemia 10/30/2022   Hypertension    Ingrowing nail 03/12/2020   Intention tremor 06/16/2019   Labile hypertension 08/16/2014   Formatting of this note might be different from the original. Last Assessment & Plan:  Slightly above goal today secondary to pain. Will continue current regimen. Will check CMP today.   Lactic acidosis  05/11/2022   Lichen planus 04/07/2018   Last Assessment & Plan:  Formatting of this note might be different from the original. Concern over possible lichen planus. His dentist noticed lesions of his oral mucosa.  It was felt to be lichen planus.  He presents to discuss further.  Rarely does the area hurt.He smoked in the distant past EXAM shows several white patches on the buccal mucosa bilaterally.  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 Rheum:  Dr. Sharmon Revere Formerly on MTX - stopped due to mouth sores. Currently started on Arava.  Also on 5 mg prednisone x 3 months during run-in.   Right-sided chest wall pain 04/17/2019   Seasonal allergies 03/23/2018   Sepsis (HCC) 05/11/2022   Stasis dermatitis of both legs 07/29/2022   Statin intolerance 07/31/2020   Stroke (HCC)    3 - TIA's   Task-specific dystonia of hand    right hand   Thoracic radiculopathy 06/16/2019   Vitamin B12 deficiency    Injections   Vitamin D deficiency 09/17/2016    Family History  Problem Relation Age of Onset   Alzheimer's disease Mother 10       Deceased in early 46s   Stroke Father 37  Deceased   Hypertension Father    Alcoholism Father    Alcoholism Brother    Heart disease Maternal Grandfather 67       Deceased   Heart disease Paternal Grandfather 71       Deceased   Hypertension Son      Past Medical History, Surgical history, Social history, and  Family history were reviewed and updated as appropriate.   Please see review of systems for further details on the patient's review from today.   Objective:   Physical Exam:  There were no vitals taken for this visit.  Physical Exam Constitutional:      General: He is not in acute distress.    Appearance: He is well-developed.  Musculoskeletal:        General: No deformity.  Neurological:     Mental Status: He is alert and oriented to person, place, and time.     Coordination: Coordination normal.     Comments: Minimal tremor  Psychiatric:        Attention and Perception: Attention normal. He is attentive.        Mood and Affect: Mood normal. Mood is not anxious or depressed. Affect is not labile or inappropriate.        Speech: Speech normal.        Behavior: Behavior normal.        Thought Content: Thought content normal. Thought content is not delusional. Thought content does not include homicidal or suicidal ideation. Thought content does not include suicidal plan.        Cognition and Memory: Cognition normal.        Judgment: Judgment normal.     Comments: Insight is good. Good humor.     Lab Review:     Component Value Date/Time   NA 140 12/31/2023 1052   NA 142 06/03/2023 1019   K 5.4 (H) 12/31/2023 1052   CL 108 12/31/2023 1052   CO2 26 12/31/2023 1052   GLUCOSE 92 12/31/2023 1052   BUN 29 (H) 12/31/2023 1052   BUN 20 06/03/2023 1019   CREATININE 1.72 (H) 12/31/2023 1052   CALCIUM 9.5 12/31/2023 1052   PROT 6.5 12/31/2023 1052   PROT 5.9 (L) 11/28/2020 0841   ALBUMIN 4.6 09/22/2023 1033   ALBUMIN 4.2 11/28/2020 0841   AST 16 12/31/2023 1052   ALT 15 12/31/2023 1052   ALKPHOS 66 09/22/2023 1033   BILITOT 0.3 12/31/2023 1052   BILITOT 0.3 11/28/2020 0841   GFRNONAA 56 (L) 09/20/2023 0915   GFRAA 75 11/28/2020 0841       Component Value Date/Time   WBC 5.3 12/31/2023 1052   RBC 3.48 (L) 12/31/2023 1052   HGB 10.6 (L) 12/31/2023 1052   HGB 11.4 (L)  06/03/2023 1019   HCT 32.7 (L) 12/31/2023 1052   HCT 35.8 (L) 06/03/2023 1019   PLT 211 12/31/2023 1052   PLT 230 06/03/2023 1019   MCV 94.0 12/31/2023 1052   MCV 91 06/03/2023 1019   MCH 30.5 12/31/2023 1052   MCHC 32.4 12/31/2023 1052   RDW 11.5 12/31/2023 1052   RDW 13.8 06/03/2023 1019   LYMPHSABS 0.3 (L) 09/20/2023 0915   LYMPHSABS 0.5 (L) 08/30/2020 1139   MONOABS 0.9 09/20/2023 0915   EOSABS 239 12/31/2023 1052   EOSABS 0.3 08/30/2020 1139   BASOSABS 58 12/31/2023 1052   BASOSABS 0.1 08/30/2020 1139    Lithium Lvl  Date Value Ref Range Status  05/22/2023 0.6 0.6 - 1.2 mmol/L  Final     Latest Reference Range & Units 08/16/14 10:13 01/19/16 09:46 12/07/18 08:41 12/23/19 09:51 05/31/20 11:12 07/31/20 11:38 08/30/20 11:39 09/06/20 22:38 09/07/20 03:00 09/25/20 10:34 11/28/20 08:41 12/14/20 10:22 07/02/21 10:48 07/05/21 10:20 07/10/21 10:22 01/02/22 10:50 04/19/22 10:29 05/11/22 17:11 05/11/22 17:43 05/12/22 02:01 05/13/22 01:51 05/14/22 04:07 05/15/22 03:06 05/30/22 10:44 07/29/22 15:25 01/29/23 12:13 03/31/23 12:00 06/03/23 10:19 06/06/23 08:23 06/23/23 09:00 06/23/23 15:06 06/30/23 11:03  Creatinine 0.70 - 1.28 mg/dL 1.1 1.61 0.96 0.45 (H) 1.23 1.34 (H) 1.24 1.26 (H) 1.26 (H) 1.23 1.14 1.36 (H) 1.34 1.54 (H) 1.42 1.44 1.45 (H) 1.67 (H) 1.70 (H) 1.51 (H) 1.29 (H) 1.38 (H) 1.31 (H) 1.41 (H) 1.75 (H) 1.66 (H) 1.36 (H) 1.30 (H) 1.30 (H) 1.35 (H) 1.26 (H) 1.29 (H)  (H): Data is abnormally high  No results found for: "PHENYTOIN", "PHENOBARB", "VALPROATE", "CBMZ"   .res Assessment: Plan:    Bipolar disorder with moderate depression (HCC) - Plan: lithium carbonate 150 MG capsule  Generalized anxiety disorder  Lithium use - Plan: Lithium level  Tremor  Chronic kidney disease, stage 3a (HCC)   He has failed multiple other psychiatric medications and does not wish to have further med changes today. Only current psych med is lithium 450 mg daily .  And his mood is good and  stable.  He doesn't want to change bc good effect.   Lithium level from January 2021 0.8 on 600 mg daily. Gradually it's crept up.  Disc in detail it's relation to renal function and Cr has increased gradually. CR 1.35 01/18/21  Creatinine 1.75 07/2022, 01/2023 CR 1.66, Cr 03/2023 = 1.36. See latest update above.  Cr has gradually come down over time  Continue lithium 450 mg daily but tremor did not get better with reduction last visit. Lithium level 10/18/21 = 0.6, 05/12/22 lithium =0.45, 05/2023 = 0.6 Check lithium level.  Check BMP every 3 mos  Counseled patient regarding potential benefits, risks, and side effects of lithium to include potential risk of lithium affecting thyroid and renal function.  Discussed need for periodic lab monitoring to determine drug level and to assess for potential adverse effects.  Counseled patient regarding signs and symptoms of lithium toxicity and advised that they notify office immediately or seek urgent medical attention if experiencing these signs and symptoms.  Patient advised to contact office with any questions or concerns. Kidney function is compromised with Cr 1.7 most recently but stable over last couple of year. He wants to continue lithium for now.  Disc best alternative mood stabilizer Trileptal but still some Ddi issues but less than CBZ.  He'd rather not change. He's failed multiple others.  6 mos  Meredith Staggers, MD, DFAPA   Please see After Visit Summary for patient specific instructions.  Future Appointments  Date Time Provider Department Center  03/22/2024 11:00 AM Sharlene Dory, [place] [facility]  03/30/2024 11:20 AM Fuller Plan, MD [facility] None  06/02/2024 11:40 AM Marykay Lex, MD [facility] None    Orders Placed This Encounter  Procedures   Lithium level       -------------------------------

## 2024-03-10 DIAGNOSIS — Z79899 Other long term (current) drug therapy: Secondary | ICD-10-CM | POA: Diagnosis not present

## 2024-03-11 LAB — LITHIUM LEVEL: Lithium Lvl: 0.8 mmol/L (ref 0.6–1.2)

## 2024-03-14 ENCOUNTER — Other Ambulatory Visit: Payer: Self-pay | Admitting: Family Medicine

## 2024-03-14 ENCOUNTER — Other Ambulatory Visit: Payer: Self-pay | Admitting: Cardiology

## 2024-03-14 DIAGNOSIS — I10 Essential (primary) hypertension: Secondary | ICD-10-CM

## 2024-03-14 DIAGNOSIS — I700 Atherosclerosis of aorta: Secondary | ICD-10-CM

## 2024-03-18 NOTE — Progress Notes (Signed)
 Office Visit Note  Patient: Noah Grant             Date of Birth: October 12, 1951           MRN: [id number]             PCP: Jobe Mulder, DO Referring: Jobe Mulder* Visit Date: 03/30/2024   Subjective:  Follow-up (Patient states he had had xrays of his back in the past and was told he has osteoarthritis in his upper back and neck. Patient states he is having pain in his neck, shoulder, and lower back. Patient states he is losing strength in his hands. Patient states he just recently had labs done. )   Discussed the use of AI scribe software for clinical note transcription with the patient, who gave verbal consent to proceed.  History of Present Illness   Noah Grant is a 73 y.o. 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 daily and prednisone  5 mg daily as needed.    He has ongoing issues with rheumatoid arthritis, primarily affecting his shoulders, hips, and hands. He experiences significant difficulty with fine motor tasks, such as picking up and holding objects, and frequently drops items. Loading his pill box weekly is particularly challenging. He continues to take leflunomide  and uses steroids as needed, though prednisone  makes him irritable.  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 The pain is described as aching and is associated with stiffness and soreness, particularly after sitting for extended periods. He finds it difficult to rise from furniture and benefits from a chair-height toilet. He has limited shoulder movement and experiences soreness in the shoulder joint itself, though the pain does not radiate down the arm.  He has developed Raynaud's phenomenon, jokingly referring to himself as a 'smurf.' He experiences significant fatigue and lightheadedness, particularly when walking uphill or on inclines, though he manages downhill walking better.  He underwent vascular studies to rule out peripheral artery disease, which were negative. He has seen a cardiologist and is scheduled for a follow-up in June.  No recent illnesses such as upper respiratory infections or gastrointestinal issues. He has not experienced any stomach or gut problems with leflunomide . Muscle relaxers have been used in the past but provide limited relief and cause drowsiness, so he only takes them at night. Joint injections have been tried previously with minimal benefit.  Previous HPI 12/31/2023 Noah Grant is a 73 y.o. 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 daily and prednisone  5 mg daily as needed.  He presents with worsening pain and discomfort in multiple areas. They report severe foot pain, particularly at night, and increasing hip pain that radiates into the leg muscles. The patient also notes worsening 'duck feet,' with their feet turning outward more than before.   The patient's hands have been turning blue several times a day, primarily under the fingernails, and the tips of their fingers are numb and tingly. They report a loss of hand function, with difficulty picking up and holding items. Their primary care provider suggested this might be Raynaud's disease.   The patient also reports persistent shoulder pain. Despite these issues, the patient states they feel "really good" overall, except for the aforementioned symptoms.   The patient's medications include amlodipine , olmesartan , and hydralazine  for their cardiovascular condition. They also take leflunomide  and prednisone   for rheumatoid arthritis, and occasionally use over-the-counter Tylenol  for pain.   The patient has a history of atrial fibrillation but has not had any clots or recurrence of the condition since their hospitalization for myocarditis. They were previously on Eliquis  and Plavix , but are currently on low-dose aspirin .   The patient also has a hiatal hernia and  Barrett's disease, and has been struggling with severe acid reflux despite taking omeprazole  twice daily. They are due to start on famotidine  in addition to the omeprazole .   The patient has not been on any muscle relaxants or neuropathy medications recently, but has previously taken gabapentin , which they stopped due to sedation. They also report a reaction to isosorbide , which caused lethargy and joint pain.   The patient's blood pressure has stabilized with their current medication regimen. They have not been sick recently, but had a bladder infection treated with antibiotics about five to six months ago. They are due to see a gastroenterologist next month for their hiatal hernia and Barrett's disease.        Previous HPI 09/30/2023 Noah Grant is a 73 y.o. 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 daily and prednisone  5 mg daily as needed.  Continues to experience daily joint pain and stiffness worst complaints in his hands and hips.  He feels there is an increase in hand swelling since the last visit.  Addition of 5 mg prednisone  he does not feel made a very big difference and is only taking occasionally.  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  In his left hand experiences pain near the base of the third finger and frequently has pain and getting stuck first thing in the morning. Besides arthritis he complains of generalized fatigue and he has to pause after short periods of exertion although he does not feel any specific change in breathing.  He still having frequent chest pain taking oral nitrates up to twice daily.   Previous HPI 06/30/2023 Noah Grant is a 73 y.o. 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 daily since our last visit still has a lot of joint pain limiting his activities uses a cane for support which decreases leg pain.  Still has a lot of shoulder pain especially with  trying to reach overhead or behind his back and bilateral hand pain especially in the MCPs.  No specific flareup or exacerbation of swelling.  He did have heart catheterization through right ulnar artery that was complicated by pseudoaneurysm had subsequent surgical repair for this.  Lateral shoulder and hip pain bothers him especially if he lies to his side at night.     Previous HPI 03/31/2023 KENZO MAROTTE is a 73 y.o. 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 daily we switch medications after last visit.  So far has not seen any significant difference in joint symptoms for better or worse.  He is tolerating the medication without noticeable side effect.  He had follow-up with his PCP office and discussed bilateral foot pain that was apparently unclear if related to peripheral neuropathy or from his rheumatoid arthritis.  Pain is mostly in the anterior half of the foot more severe along the top and across MTPs particularly bad early in the day when weightbearing or with direct pressure.  His mobility is also limited with bilateral hip pain.  Hurts worse on the side of the  hips gets much worse after prolonged walking also gets pain if lying on either side in bed from direct pressure.   Previous HPI 01/29/23 QUILLIE BLEAKLEY is a 73 y.o. male here for follow up for seropositive RA on Actemra  162 mg subcu q. 14 days.  Overall he is feels very poorly like his arthritis is not benefiting at all from the treatment and mobility is worse than a few months ago.  He stopped some cardiac medications with improvement in his dizziness and leg swelling and fatigue.  Recent 2D echo look normal with no evidence of effusion or reduced ejection fraction from previous myocarditis episode.  He has ongoing bilateral shoulder pain with stiffness that remains throughout the entire day.  He is using a cane for offloading due to pain affecting hips and feet on both sides worse while walking.  He is  seeing some diffuse swelling throughout the hands this appears worse at the end of the day does not localize to any particular joints.  Does cause difficulty fully closing his grip.   Previous HPI 10/29/22 MAUDE KULICH is a 73 y.o. 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  He completed the colchicine  and is now on just the Actemra  treatment.  He is feeling very fatigued limiting his overall activity level.  He is also been experiencing some additional pain and stiffness in multiple areas.  He had increased swelling in both legs accumulating towards the feet and ankles.  He has had adjustment to beta blocker treatment currently prescribed metoprolol  XR 25 mg daily. Bradycardia and heart rate improved now often in the 50s did not notice any difference in the peripheral swelling after the medicine change. He is also still on norvasc  and hydralazine  for blood pressure control.   Previous HPI 05/30/22 ANTAWON MAYHUGH is a 73 y.o. male here for follow up for RA he was on leflunomide  recently hospitalized with sepsis and pericarditis without clear underlying infection identified.  Symptoms started with chills developed increasing swelling in his hands and feet with joint pain that was worse in the shoulders and hips.  He developed severe chest pain felt a constriction or tightness around the heart.  The chest pain was sharp did report provocation with deep breaths or lying in left or right positions.  Evaluation was consistent with pericarditis with effusion.  He was discharged on colchicine  and received short term steroids and the chest pain is currently doing well.  The flareup of joint pain has also improved with the oral anti-inflammatory medication.   Previous HPI 04/19/22 SAMAJ DOEHRING is a 73 y.o. male here for rheumatoid arthritis previously seeing Dr. Ziolkowska and on treatment with leflunomide .  Symptoms started a long time ago gradual  onset but has been seeing trucks for treatment of this in the past decade or so.  He has joint pain and stiffness involving multiple areas.  Shoulders, wrists, hands, also bilateral hip pains.  Most commonly sees visible swelling or inflammation in his hands.  He has morning stiffness very severe for 30 minutes to an hour but keeps persistent joint stiffness throughout the day.  Also has pain increased when trying to lie still at night.  He has tried a number of treatments for this over multiple years.  He did not tolerate hydroxychloroquine due to confusion, MTX not tolerated, leflunomide  leukopenia, and sulfasalazine mood disturbances.  He never experienced a significant improvement with Humira injection and  developed acute conjunctivitis shortly after starting Remicade  infusion.   DMARD Hx LEF current MTX GI intolerance HCQ Psychiatric side effects SSZ Psychiatric side effects Humira nonresponder Remicade  nonresponder and conjunctivitis   12/2021 Cholesterol 107 TGs 193 HDL 28.9   09/2021 HBV neg HCV neg   Review of Systems  Constitutional:  Positive for fatigue.  HENT:  Negative for mouth sores and mouth dryness.   Eyes:  Negative for dryness.  Respiratory:  Positive for shortness of breath.   Cardiovascular:  Positive for chest pain. Negative for palpitations.  Gastrointestinal:  Negative for blood in stool, constipation and diarrhea.  Endocrine: Positive for increased urination.  Genitourinary:  Negative for involuntary urination.  Musculoskeletal:  Positive for joint pain, gait problem, joint pain, myalgias, muscle weakness, morning stiffness, muscle tenderness and myalgias. Negative for joint swelling.  Skin:  Positive for color change. Negative for rash, hair loss and sensitivity to sunlight.  Allergic/Immunologic: Negative for susceptible to infections.  Neurological:  Positive for dizziness. Negative for headaches.  Hematological:  Negative for swollen glands.   Psychiatric/Behavioral:  Positive for sleep disturbance. Negative for depressed mood. The patient is not nervous/anxious.     PMFS History:  Patient Active Problem List   Diagnosis Date Noted   Chronic fatigue 12/21/2023   Trigger finger, left middle finger 09/30/2023   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   Hyperphosphatemia 10/30/2022   Lithium  use 10/30/2022   Anemia 09/02/2022   Atrial fibrillation (HCC) 09/02/2022   Stasis dermatitis of both legs 07/29/2022   Myocarditis (HCC) 05/14/2022   Pericarditis 05/12/2022   Atrial fibrillation with rapid ventricular response (HCC) 05/12/2022   Sepsis (HCC) 05/11/2022   Elevated troponin level not due myocardial infarction 05/11/2022   Lactic acidosis 05/11/2022   Drug therapy 05/18/2021   Normocytic anemia    Arthritis    Bipolar 1 disorder (HCC)    Chicken pox    Chronic bronchitis (HCC)    Depression    Hyperlipidemia with target low density lipoprotein (LDL) cholesterol less than 55 mg/dL    Rheumatic fever    Essential hypertension    Chest pain 09/06/2020   Coronary artery disease involving native coronary artery of native heart with angina pectoris (HCC) 09/06/2020   Angina pectoris (HCC) 08/30/2020   Cardiac murmur 08/30/2020   Statin intolerance 07/31/2020   Abdominal aortic atherosclerosis (HCC)    Ingrowing nail 03/12/2020   Bipolar affective disorder, currently depressed, moderate (HCC) 03/08/2020   Brain ischemia 03/08/2020   Cognitive complaints 03/08/2020   History of stroke 11/10/2019   Chronic kidney disease, stage 3a (HCC) 07/21/2019   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   Thoracic radiculopathy 06/16/2019   Intention tremor 06/16/2019   Right-sided chest wall pain 04/17/2019   Barrett's esophagus without dysplasia 03/25/2019   History of colon polyps 03/25/2019   PAD (peripheral artery disease) (HCC) 03/23/2019   Benign prostatic hyperplasia with nocturia 12/29/2018   BPH with urinary obstruction  12/29/2018   GAD (generalized anxiety disorder) 11/02/2018   Rheumatoid arthritis (HCC) 10/29/2018   Lichen planus 04/07/2018   High risk medication use 03/23/2018   Seasonal allergies 03/23/2018   Long-term use of immunosuppressant medication 03/23/2018   Low testosterone 09/17/2016   Vitamin B12 deficiency 09/17/2016   Vitamin D  deficiency 09/17/2016   Disc degeneration, 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   Pain of right hip joint 01/19/2016   Herpes gingivostomatitis 02/10/2015   Midline thoracic back pain 02/10/2015   Normochromic normocytic anemia 08/16/2014   Bipolar 1 disorder, mixed, moderate (  HCC) 08/16/2014   Gastroesophageal reflux disease 08/16/2014   Bipolar depression (HCC) 08/16/2014   GERD without esophagitis 08/16/2014   Bipolar 2 disorder (HCC) 08/16/2014    Past Medical History:  Diagnosis Date   Abdominal aortic atherosclerosis (HCC)    Anemia    likely of chronic disease   Angina pectoris (HCC) 08/30/2020   Arthritis    rheumatoid   Atrial fibrillation (HCC)    Atrial fibrillation with rapid ventricular response (HCC) 05/12/2022   06/20/23 - pt states he's not had any A-fib   Barrett's esophagus without dysplasia 03/25/2019   Formatting of this note might be different from the original. EGD done in 02/2019. Due in 3 years.   Benign prostatic hyperplasia with nocturia 12/29/2018   Bipolar 1 disorder (HCC)    Bipolar 1 disorder, mixed, moderate (HCC) 08/16/2014   Bipolar 2 disorder (HCC) 08/16/2014   Bipolar affective disorder, currently depressed, moderate (HCC) 03/08/2020   Bipolar depression (HCC) 08/16/2014   BPH with urinary obstruction 12/29/2018   Brain ischemia 03/08/2020   Cardiac murmur 08/30/2020   Chest pain 09/06/2020   Chicken pox    Chronic bronchitis (HCC)    Chronic kidney disease, stage 3a (HCC) 07/21/2019   Cognitive complaints 03/08/2020   Coronary artery disease involving native coronary artery of native heart 09/06/2020   Coronary artery disease  involving native coronary artery of native heart with angina pectoris (HCC) 09/06/2020   Depression    Disc degeneration, 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 Last Assessment & Plan:  Well controlled. Continue current medications.   GERD without esophagitis 08/16/2014   Formatting of this note might be different from the original. Last Assessment & Plan:  Well controlled. Continue current medications.   Herpes gingivostomatitis 02/10/2015   Hiatal hernia    High risk medication use 03/23/2018   History of colon polyps 03/25/2019   Formatting of this note might be different from the original. tubular adenoma   History of stroke 11/10/2019   Hyperlipidemia    Hyperphosphatemia 10/30/2022   Hypertension    Ingrowing nail 03/12/2020   Intention tremor 06/16/2019   Labile hypertension 08/16/2014   Formatting of this note might be different from the original. Last Assessment & Plan:  Slightly above goal today secondary to pain. Will continue current regimen. Will check CMP today.   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 Concern over possible lichen planus. His dentist noticed lesions of his oral mucosa.  It was felt to be lichen planus.  He presents to discuss further.  Rarely does the area hurt.He smoked in the distant past EXAM shows several white patches on the buccal mucosa bilaterally.  No other concerning oral mucosal les   Lithium  use 10/30/2022   Long-term use of immunosuppressant medication 03/23/2018   Low testosterone 09/17/2016   Midline thoracic back pain 02/10/2015   Mixed hyperlipidemia 08/16/2014   Myocarditis (HCC)  05/14/2022   Normochromic normocytic anemia 08/16/2014   Normocytic anemia    likely of chronic disease   PAD (peripheral artery disease) (HCC) 03/23/2019   Pain of right hip joint 01/19/2016   Pericarditis 05/12/2022   Raynaud's disease  Rheumatic fever    Rheumatoid arthritis (HCC) 10/29/2018   Formatting of this note might be different from the original. Rheum:  Dr. Ziolkowska Formerly on MTX - stopped due to mouth sores. Currently started on Arava .  Also on 5 mg prednisone  x 3 months during run-in.   Right-sided chest wall pain 04/17/2019   Seasonal allergies 03/23/2018   Sepsis (HCC) 05/11/2022   Stasis dermatitis of both legs 07/29/2022   Statin intolerance 07/31/2020   Stroke (HCC)    3 - TIA's   Task-specific dystonia of hand    right hand   Thoracic radiculopathy 06/16/2019   Vitamin B12 deficiency    Injections   Vitamin D  deficiency 09/17/2016    Family History  Problem Relation Age of Onset   Alzheimer's disease Mother 21       Deceased in early 63s   Stroke Father 15       Deceased   Hypertension Father    Alcoholism Father    Alcoholism Brother    Heart disease Maternal Grandfather 73       Deceased   Heart disease Paternal Grandfather 42       Deceased   Hypertension Son    Past Surgical History:  Procedure Laterality Date   CLAVICLE SURGERY     Right, Hardware placed   CORONARY STENT INTERVENTION N/A 09/06/2020   Procedure: CORONARY STENT INTERVENTION;  Surgeon: Arnoldo Lapping, MD;  Location[facility] INVASIVE CV LAB;  Service: Cardiovascular;  Laterality: N/A;   FALSE ANEURYSM REPAIR Right 06/23/2023   Procedure: RIGHT ULNAR ARTERY PSEUDOANEURYSM REPAIR;  Surgeon: Kayla Part, MD;  Location: [facility] OR;  Service: Vascular;  Laterality: Right;   ingrown toenail Bilateral    great toes   LEFT HEART CATH AND CORONARY ANGIOGRAPHY N/A 09/06/2020   Procedure: LEFT HEART CATH AND CORONARY ANGIOGRAPHY;  Surgeon: Arnoldo Lapping, MD;  Location: [facility] INVASIVE CV  LAB;  Service: Cardiovascular;  Laterality: N/A;   LEFT HEART CATH AND CORONARY ANGIOGRAPHY N/A 06/06/2023   Procedure: LEFT HEART CATH AND CORONARY ANGIOGRAPHY;  Surgeon: Arleen Lacer, MD;  Location: [facility] INVASIVE CV LAB;  Service: Cardiovascular;  Laterality: N/A;   WISDOM TOOTH EXTRACTION     Social History   Social History Narrative   Not on file   Immunization History  Administered Date(s) Administered   Fluad Quad(high Dose 65+) 09/03/2021, 09/04/2022, 08/29/2023   Influenza, High Dose Seasonal PF 09/10/2019, 09/03/2020   Influenza,inj,Quad PF,6+ Mos 08/22/2014, 10/04/2015   Moderna SARS-COV2 Booster Vaccination 10/31/2020, 07/04/2021   Moderna Sars-Covid-2 Vaccination 01/17/2020, 02/13/2020   PNEUMOCOCCAL CONJUGATE-20 01/02/2022   Pfizer(Comirnaty )Fall Seasonal Vaccine 12 years and older 09/18/2023   Zoster Recombinant(Shingrix) 07/02/2017, 09/02/2017   Zoster, Live 08/17/2011     Objective: Vital Signs: BP (!) 169/65 (BP Location: Right Arm, Patient Position: Sitting, Cuff Size: Normal)   Pulse (!) 57   Resp 14   Ht 5\' 7"  (1.702 m)   Wt 165 lb (74.8 kg)   BMI 25.84 kg/m    Physical Exam Cardiovascular:     Rate and Rhythm: Normal rate and regular rhythm.  Pulmonary:     Effort: Pulmonary effort is normal.     Breath sounds: Normal breath sounds.  Musculoskeletal:     Right lower leg: No edema.     Left lower leg: No edema.  Skin:    General: Skin is warm and dry.     Findings: No rash.  Neurological:     Mental Status: He  is alert.  Psychiatric:        Mood and Affect: Mood normal.      Musculoskeletal Exam:  Decreased shoulder range of motion bilaterally, tenderness to pressure worse on anterior joints  Elbows full ROM no tenderness or swelling Wrists full ROM no tenderness or swelling Fingers full ROM, chronic MCP joint thickening no focal tenderness or palpable swelling Tenderness to pressure on low back at SI joints, lateral hip tenderness to  pressure on both sides, tenderness to pressure and stiffness along IT band Knees full ROM no tenderness or swelling, more limited by muscle stiffness proximal      Investigation: No additional findings.  Imaging: No results found.   Recent Labs: Lab Results  Component Value Date   WBC 5.7 03/22/2024   HGB 10.9 (L) 03/22/2024   PLT 195.0 03/22/2024   NA 139 03/25/2024   K 5.2 03/25/2024   CL 108 03/25/2024   CO2 25 03/25/2024   GLUCOSE 96 03/25/2024   BUN 31 (H) 03/25/2024   CREATININE 1.71 (H) 03/25/2024   BILITOT 0.4 03/22/2024   ALKPHOS 63 03/22/2024   AST 16 03/22/2024   ALT 13 03/22/2024   PROT 6.4 03/22/2024   ALBUMIN 4.7 03/22/2024   CALCIUM  9.8 03/25/2024   GFRAA 75 11/28/2020   QFTBGOLDPLUS NEGATIVE 04/19/2022    Speciality Comments: No specialty comments available.  Procedures:  No procedures performed Allergies: Methotrexate, Nsaids, Plaquenil [hydroxychloroquine], Azulfidine [sulfasalazine], Crestor  [rosuvastatin ], Iodinated contrast media, Isosorbide , Mevacor  [lovastatin ], Pravachol  [pravastatin ], Ranexa  [ranolazine  er], and Ranolazine    Assessment / Plan:     Visit Diagnoses: Rheumatoid arthritis involving multiple sites, unspecified whether rheumatoid factor present (HCC) - Plan: predniSONE  (DELTASONE ) 5 MG tablet, leflunomide  (ARAVA ) 20 MG tablet Chronic rheumatoid arthritis affecting shoulders, hips, and hands with significant functional impairment. Prednisone  provides partial relief but causes irritability. Leflunomide  well-tolerated. - Continue leflunomide  20 mg daily. - Use prednisone  5 mg daily as needed, minimize use due to irritability.  High risk medication use - leflunomide  20 mg daily Recent labs reviewed with metabolic panel and blood count appropriate for continued leflunomide .  No serious interval infections.  Osteoarthritis of spine Chronic osteoarthritis causing significant stiffness and soreness in lower back, upper right side, and  neck. Previous imaging unclear. - Consider referral to orthopedics for further evaluation.  Fatigue Chronic fatigue with lightheadedness, exacerbated by exertion. Previous vascular studies negative. Etiology unclear, potential cardiac, pulmonary, or deconditioning causes considered. - Follow up with cardiologist in June.        Orders: No orders of the defined types were placed in this encounter.  Meds ordered this encounter  Medications   predniSONE  (DELTASONE ) 5 MG tablet    Sig: Take 1 tablet (5 mg total) by mouth daily as needed.    Dispense:  30 tablet    Refill:  2   leflunomide  (ARAVA ) 20 MG tablet    Sig: Take 1 tablet (20 mg total) by mouth daily.    Dispense:  90 tablet    Refill:  0     Follow-Up Instructions: Return in about 3 months (around 06/29/2024) for RA on LEF/GC f/u 3mos.   Matt Song, MD  Note - This record has been created using [employer].  Chart creation errors have been sought, but may not always  have been located. Such creation errors do not reflect on  the standard of medical care.

## 2024-03-22 ENCOUNTER — Encounter: Payer: Self-pay | Admitting: Family Medicine

## 2024-03-22 ENCOUNTER — Other Ambulatory Visit: Payer: Self-pay | Admitting: Family Medicine

## 2024-03-22 ENCOUNTER — Ambulatory Visit (INDEPENDENT_AMBULATORY_CARE_PROVIDER_SITE_OTHER): Payer: PPO | Admitting: Family Medicine

## 2024-03-22 VITALS — BP 132/59 | HR 55 | Temp 98.8°F | Resp 16 | Ht 67.0 in | Wt 167.0 lb

## 2024-03-22 DIAGNOSIS — R5383 Other fatigue: Secondary | ICD-10-CM | POA: Diagnosis not present

## 2024-03-22 DIAGNOSIS — K219 Gastro-esophageal reflux disease without esophagitis: Secondary | ICD-10-CM | POA: Diagnosis not present

## 2024-03-22 DIAGNOSIS — I10 Essential (primary) hypertension: Secondary | ICD-10-CM | POA: Diagnosis not present

## 2024-03-22 DIAGNOSIS — R351 Nocturia: Secondary | ICD-10-CM | POA: Diagnosis not present

## 2024-03-22 LAB — COMPREHENSIVE METABOLIC PANEL WITH GFR
ALT: 13 U/L (ref 0–53)
AST: 16 U/L (ref 0–37)
Albumin: 4.7 g/dL (ref 3.5–5.2)
Alkaline Phosphatase: 63 U/L (ref 39–117)
BUN: 26 mg/dL — ABNORMAL HIGH (ref 6–23)
CO2: 26 meq/L (ref 19–32)
Calcium: 9.6 mg/dL (ref 8.4–10.5)
Chloride: 108 meq/L (ref 96–112)
Creatinine, Ser: 1.58 mg/dL — ABNORMAL HIGH (ref 0.40–1.50)
GFR: 43.49 mL/min — ABNORMAL LOW (ref >60.00)
Glucose, Bld: 91 mg/dL (ref 70–99)
Potassium: 5.3 meq/L — ABNORMAL HIGH (ref 3.5–5.1)
Sodium: 139 meq/L (ref 135–145)
Total Bilirubin: 0.4 mg/dL (ref 0.2–1.2)
Total Protein: 6.4 g/dL (ref 6.0–8.3)

## 2024-03-22 LAB — CBC
HCT: 33.4 % — ABNORMAL LOW (ref 39.0–52.0)
Hemoglobin: 10.9 g/dL — ABNORMAL LOW (ref 13.0–17.0)
MCHC: 32.7 g/dL (ref 30.0–36.0)
MCV: 93.6 fl (ref 78.0–100.0)
Platelets: 195 10*3/uL (ref 150.0–400.0)
RBC: 3.57 Mil/uL — ABNORMAL LOW (ref 4.22–5.81)
RDW: 13.7 % (ref 11.5–15.5)
WBC: 5.7 10*3/uL (ref 4.0–10.5)

## 2024-03-22 LAB — VITAMIN D 25 HYDROXY (VIT D DEFICIENCY, FRACTURES): VITD: 29.6 ng/mL — ABNORMAL LOW (ref 30.00–100.00)

## 2024-03-22 LAB — TSH: TSH: 1.39 u[IU]/mL (ref 0.35–5.50)

## 2024-03-22 LAB — T4, FREE: Free T4: 0.56 ng/dL — ABNORMAL LOW (ref 0.60–1.60)

## 2024-03-22 MED ORDER — FINASTERIDE 5 MG PO TABS
5.0000 mg | ORAL_TABLET | Freq: Every day | ORAL | 1 refills | Status: DC
Start: 2024-03-22 — End: 2024-09-13

## 2024-03-22 MED ORDER — FAMOTIDINE 40 MG PO TABS: 40.0000 mg | ORAL_TABLET | Freq: Every day | ORAL | 1 refills | Status: DC

## 2024-03-22 MED ORDER — LEVOTHYROXINE SODIUM 25 MCG PO TABS: 25.0000 ug | ORAL_TABLET | Freq: Every day | ORAL | 1 refills | Status: DC

## 2024-03-22 NOTE — Progress Notes (Signed)
 Chief Complaint  Patient presents with   Hypertension    Here for follow up   Fatigue    Patient complains of feeling tired   Dizziness    Patient complains of on and off dizziness    Subjective Noah Grant is a 73 y.o. male who presents for hypertension follow up. He does not monitor home blood pressures. He is compliant with medications- olmesartan 40 mg/d, Norvasc 10 mg/d, hydralazine 100 mg TID. Patient has these side effects of medication: none He is sometimes adhering to a healthy diet overall. Current exercise: none No new CP or SOB.   BPH  Taking Proscar 5 mg daily. Urinating 5-7 times per night. Improved from 10 times per night. No pain, drainage, bleeding. Compliant w med, no infections. Saw urology in the past, not interested in surgery at this time.   Over past year, has had issues with fatigue and lightheadedness. Usually when he stands up. No diarrhea, med changes, decreased PO intake. Follows closely w cardiology team.  He has to take frequent naps and does not feel well rested when he wakes up in the morning.  He follows with psychiatry and reports his mood is stable overall.  Diet/exercise as above.   Past Medical History:  Diagnosis Date   Abdominal aortic atherosclerosis (HCC)    Anemia    likely of chronic disease   Angina pectoris (HCC) 08/30/2020   Arthritis    rheumatoid   Atrial fibrillation (HCC)    Atrial fibrillation with rapid ventricular response (HCC) 05/12/2022   06/20/23 - pt states he's not had any A-fib   Barrett's esophagus without dysplasia 03/25/2019   Formatting of this note might be different from the original. EGD done in 02/2019. Due in 3 years.   Benign prostatic hyperplasia with nocturia 12/29/2018   Bipolar 1 disorder (HCC)    Bipolar 1 disorder, mixed, moderate (HCC) 08/16/2014   Bipolar 2 disorder (HCC) 08/16/2014   Bipolar affective disorder, currently depressed, moderate (HCC) 03/08/2020   Bipolar depression (HCC) 08/16/2014    BPH with urinary obstruction 12/29/2018   Brain ischemia 03/08/2020   Cardiac murmur 08/30/2020   Chest pain 09/06/2020   Chicken pox    Chronic bronchitis (HCC)    Chronic kidney disease, stage 3a (HCC) 07/21/2019   Cognitive complaints 03/08/2020   Coronary artery disease involving native coronary artery of native heart 09/06/2020   Coronary artery disease involving native coronary artery of native heart with angina pectoris (HCC) 09/06/2020   Depression    Disc degeneration, 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 Last Assessment & Plan:  Well controlled. Continue current medications.   GERD without esophagitis 08/16/2014   Formatting of this note might be different from the original. Last Assessment & Plan:  Well controlled. Continue current medications.   Herpes gingivostomatitis 02/10/2015   Hiatal hernia    High risk medication use 03/23/2018   History of colon polyps 03/25/2019   Formatting of this note might be different from the original. tubular adenoma   History of stroke 11/10/2019   Hyperlipidemia    Hyperphosphatemia 10/30/2022   Hypertension    Ingrowing nail 03/12/2020   Intention tremor 06/16/2019   Labile hypertension 08/16/2014   Formatting of this note might be  different from the original. Last Assessment & Plan:  Slightly above goal today secondary to pain. Will continue current regimen. Will check CMP today.   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 Concern over possible lichen planus. His dentist noticed lesions of his oral mucosa.  It was felt to be lichen planus.  He presents to discuss further.  Rarely  does the area hurt.He smoked in the distant past EXAM shows several white patches on the buccal mucosa bilaterally.  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 Rheum:  Dr. Sharmon Revere Formerly on MTX - stopped due to mouth sores. Currently started on Arava.  Also on 5 mg prednisone x 3 months during run-in.   Right-sided chest wall pain 04/17/2019   Seasonal allergies 03/23/2018   Sepsis (HCC) 05/11/2022   Stasis dermatitis of both legs 07/29/2022   Statin intolerance 07/31/2020   Stroke (HCC)    3 - TIA's   Task-specific dystonia of hand    right hand   Thoracic radiculopathy 06/16/2019   Vitamin B12 deficiency    Injections   Vitamin D deficiency 09/17/2016    Exam BP (!) 132/59 (BP Location: Right Arm, Patient Position: Sitting, Cuff Size: Normal)   Pulse (!) 55   Temp 98.8 F (37.1 C) (Oral)   Resp 16   Ht 5\' 7"  (1.702 m)   Wt 167 lb (75.8 kg)   SpO2 100%   BMI 26.16 kg/m  General:  well developed, well nourished, in no apparent distress Heart: Regular rhythm, bradycardic, no bruits, no LE edema Lungs: clear to auscultation, no accessory muscle use Psych: well oriented with normal range of affect and appropriate judgment/insight  Essential hypertension  Nocturia - Plan: finasteride (PROSCAR) 5 MG tablet  Gastroesophageal reflux disease, unspecified whether esophagitis present - Plan: famotidine (PEPCID) 40 MG tablet  Fatigue,  unspecified type - Plan: CBC, Comprehensive metabolic panel with GFR, VITAMIN D 25 Hydroxy (Vit-D Deficiency, Fractures), TSH, T4, free  Chronic, stable.  Continue with Benicar 40 mg daily, Norvasc 10 mg daily, hydralazine 100 mg 3 times daily.  Counseled on diet and exercise. Chronic, stable.  Continue Proscar 5 mg daily.  He is not interested in the surgical procedure.  Some of his other symptoms, would hold off on tamsulosin. As above. Counseled on diet and exercise.  Check labs.  If no abnormalities, consider referral to the sleep team.  He will check with his rheumatologist to see if any medications could be contributing to his symptoms.  Stay hydrated. F/u in 6 months. The patient voiced understanding and agreement to the plan.  Jilda Roche [place], DO 03/22/24  11:27 AM

## 2024-03-22 NOTE — Patient Instructions (Addendum)
 Give us  2-3 business days to get the results of your labs back.   Try to drink 55-60 oz of water daily outside of exercise.  Keep the diet clean and stay active.  Let us  know if you need anything.

## 2024-03-23 ENCOUNTER — Other Ambulatory Visit: Payer: Self-pay

## 2024-03-23 DIAGNOSIS — E876 Hypokalemia: Secondary | ICD-10-CM

## 2024-03-25 ENCOUNTER — Other Ambulatory Visit (INDEPENDENT_AMBULATORY_CARE_PROVIDER_SITE_OTHER)

## 2024-03-25 DIAGNOSIS — E876 Hypokalemia: Secondary | ICD-10-CM | POA: Diagnosis not present

## 2024-03-26 ENCOUNTER — Encounter: Payer: Self-pay | Admitting: Family Medicine

## 2024-03-26 LAB — BASIC METABOLIC PANEL WITH GFR
BUN/Creatinine Ratio: 18 (calc) (ref 6–22)
BUN: 31 mg/dL — ABNORMAL HIGH (ref 7–25)
CO2: 25 mmol/L (ref 20–32)
Calcium: 9.8 mg/dL (ref 8.6–10.3)
Chloride: 108 mmol/L (ref 98–110)
Creat: 1.71 mg/dL — ABNORMAL HIGH (ref 0.70–1.28)
Glucose, Bld: 96 mg/dL (ref 65–99)
Potassium: 5.2 mmol/L (ref 3.5–5.3)
Sodium: 139 mmol/L (ref 135–146)
eGFR: 42 mL/min/{1.73_m2} — ABNORMAL LOW (ref >=60)

## 2024-03-30 ENCOUNTER — Ambulatory Visit: Payer: PPO | Attending: Internal Medicine | Admitting: Internal Medicine

## 2024-03-30 ENCOUNTER — Encounter: Payer: Self-pay | Admitting: Internal Medicine

## 2024-03-30 VITALS — BP 169/65 | HR 57 | Resp 14 | Ht 67.0 in | Wt 165.0 lb

## 2024-03-30 DIAGNOSIS — Z79899 Other long term (current) drug therapy: Secondary | ICD-10-CM | POA: Diagnosis not present

## 2024-03-30 DIAGNOSIS — M549 Dorsalgia, unspecified: Secondary | ICD-10-CM

## 2024-03-30 DIAGNOSIS — I7300 Raynaud's syndrome without gangrene: Secondary | ICD-10-CM

## 2024-03-30 DIAGNOSIS — M25551 Pain in right hip: Secondary | ICD-10-CM | POA: Diagnosis not present

## 2024-03-30 DIAGNOSIS — M069 Rheumatoid arthritis, unspecified: Secondary | ICD-10-CM

## 2024-03-30 DIAGNOSIS — G8929 Other chronic pain: Secondary | ICD-10-CM

## 2024-03-30 DIAGNOSIS — Z7952 Long term (current) use of systemic steroids: Secondary | ICD-10-CM | POA: Diagnosis not present

## 2024-03-30 DIAGNOSIS — M25552 Pain in left hip: Secondary | ICD-10-CM

## 2024-03-30 MED ORDER — PREDNISONE 5 MG PO TABS: 5.0000 mg | ORAL_TABLET | Freq: Every day | ORAL | 2 refills | Status: DC | PRN

## 2024-03-30 MED ORDER — LEFLUNOMIDE 20 MG PO TABS: 20.0000 mg | ORAL_TABLET | Freq: Every day | ORAL | 0 refills | Status: DC

## 2024-04-22 ENCOUNTER — Telehealth: Payer: Self-pay | Admitting: Family Medicine

## 2024-04-22 DIAGNOSIS — I10 Essential (primary) hypertension: Secondary | ICD-10-CM

## 2024-04-22 NOTE — Telephone Encounter (Signed)
 Orders placed.

## 2024-04-22 NOTE — Telephone Encounter (Signed)
 I need lab orders for this pt for 5/27

## 2024-04-22 NOTE — Telephone Encounter (Signed)
TSH and Free T4

## 2024-05-04 ENCOUNTER — Encounter: Payer: Self-pay | Admitting: Family Medicine

## 2024-05-04 ENCOUNTER — Ambulatory Visit: Payer: Self-pay | Admitting: Family Medicine

## 2024-05-04 ENCOUNTER — Ambulatory Visit

## 2024-05-04 ENCOUNTER — Ambulatory Visit (INDEPENDENT_AMBULATORY_CARE_PROVIDER_SITE_OTHER): Admitting: Family Medicine

## 2024-05-04 VITALS — BP 130/78 | HR 78 | Temp 98.0°F | Resp 16 | Ht 67.0 in | Wt 163.0 lb

## 2024-05-04 DIAGNOSIS — E039 Hypothyroidism, unspecified: Secondary | ICD-10-CM

## 2024-05-04 LAB — T4, FREE: Free T4: 0.68 ng/dL (ref 0.60–1.60)

## 2024-05-04 LAB — TSH: TSH: 1.44 u[IU]/mL (ref 0.35–5.50)

## 2024-05-04 NOTE — Progress Notes (Signed)
 Chief Complaint  Patient presents with   Follow-up    Follow up     Subjective: Patient is a 73 y.o. male here for f/u.  Hypothyroidism Patient presents for follow-up of hypothyroidism.  Reports compliance with medication- levothyroxine  25 mcg/d. Current symptoms include: denies fatigue, weight changes, heat/cold intolerance, bowel/skin changes or CVS symptoms He believes his dose should be not significantly changed  Past Medical History:  Diagnosis Date   Abdominal aortic atherosclerosis (HCC)    Anemia    likely of chronic disease   Angina pectoris (HCC) 08/30/2020   Arthritis    rheumatoid   Atrial fibrillation (HCC)    Atrial fibrillation with rapid ventricular response (HCC) 05/12/2022   06/20/23 - pt states he's not had any A-fib   Barrett's esophagus without dysplasia 03/25/2019   Formatting of this note might be different from the original. EGD done in 02/2019. Due in 3 years.   Benign prostatic hyperplasia with nocturia 12/29/2018   Bipolar 1 disorder (HCC)    Bipolar 1 disorder, mixed, moderate (HCC) 08/16/2014   Bipolar 2 disorder (HCC) 08/16/2014   Bipolar affective disorder, currently depressed, moderate (HCC) 03/08/2020   Bipolar depression (HCC) 08/16/2014   BPH with urinary obstruction 12/29/2018   Brain ischemia 03/08/2020   Cardiac murmur 08/30/2020   Chest pain 09/06/2020   Chicken pox    Chronic bronchitis (HCC)    Chronic kidney disease, stage 3a (HCC) 07/21/2019   Cognitive complaints 03/08/2020   Coronary artery disease involving native coronary artery of native heart 09/06/2020   Coronary artery disease involving native coronary artery of native heart with angina pectoris (HCC) 09/06/2020   Depression    Disc degeneration, 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 Last Assessment & Plan:  Well controlled. Continue current medications.   GERD without esophagitis 08/16/2014   Formatting of this note might be different from the original. Last Assessment & Plan:  Well controlled. Continue current medications.   Herpes gingivostomatitis 02/10/2015   Hiatal hernia    High risk medication use 03/23/2018   History of colon polyps 03/25/2019   Formatting of this note might be different from the original. tubular adenoma   History of stroke 11/10/2019   Hyperlipidemia    Hyperphosphatemia 10/30/2022   Hypertension    Ingrowing nail 03/12/2020   Intention tremor 06/16/2019   Labile hypertension 08/16/2014   Formatting of this note might be different from the original. Last Assessment & Plan:  Slightly above goal today secondary to pain. Will continue current regimen. Will check CMP today.   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 Concern over possible lichen planus. His dentist noticed lesions of his oral mucosa.  It was felt to be lichen planus.  He presents to discuss further.  Rarely does the area hurt.He smoked in the distant past EXAM shows several white patches on the buccal mucosa bilaterally.  No other concerning oral mucosal les   Lithium  use 10/30/2022   Long-term use of immunosuppressant medication 03/23/2018   Low testosterone 09/17/2016   Midline thoracic back pain 02/10/2015   Mixed hyperlipidemia 08/16/2014   Myocarditis (HCC) 05/14/2022   Normochromic normocytic  anemia 08/16/2014   Normocytic anemia    likely of chronic disease   PAD (peripheral artery disease) (HCC) 03/23/2019   Pain of right hip joint 01/19/2016   Pericarditis 05/12/2022   Raynaud's disease    Rheumatic fever    Rheumatoid arthritis (HCC) 10/29/2018   Formatting of this note might be different  from the original. Rheum:  Dr. Ziolkowska Formerly on MTX - stopped due to mouth sores. Currently started on Arava .  Also on 5 mg prednisone  x 3 months during run-in.   Right-sided chest wall pain 04/17/2019   Seasonal allergies 03/23/2018   Sepsis (HCC) 05/11/2022   Stasis dermatitis of both legs 07/29/2022   Statin intolerance 07/31/2020   Stroke (HCC)    3 - TIA's   Task-specific dystonia of hand    right hand   Thoracic radiculopathy 06/16/2019   Vitamin B12 deficiency    Injections   Vitamin D  deficiency 09/17/2016    Objective: BP 130/78 (BP Location: Left Arm, Patient Position: Sitting)   Pulse 78   Temp 98 F (36.7 C) (Oral)   Resp 16   Ht 5\' 7"  (1.702 m)   Wt 163 lb (73.9 kg)   SpO2 98%   BMI 25.53 kg/m  General: Awake, appears stated age Neck: No asymmetry or thyromegaly Heart: RRR, no LE edema Lungs: CTAB, no rales, wheezes or rhonchi. No accessory muscle use Psych: Age appropriate judgment and insight, normal affect and mood  Assessment and Plan: Hypothyroidism, unspecified type - Plan: TSH, T4, free  Chronic, probably stable. Cont levothyroxine  25 mcg/d. Ck labs. F/u as originally scheduled.  The patient voiced understanding and agreement to the plan.  Shellie Dials [place], DO 05/04/24  10:47 AM

## 2024-05-06 ENCOUNTER — Other Ambulatory Visit (INDEPENDENT_AMBULATORY_CARE_PROVIDER_SITE_OTHER): Payer: Self-pay

## 2024-05-06 ENCOUNTER — Encounter: Payer: Self-pay | Admitting: Orthopedic Surgery

## 2024-05-06 ENCOUNTER — Ambulatory Visit: Admitting: Orthopedic Surgery

## 2024-05-06 VITALS — BP 168/70 | HR 59 | Ht 67.0 in | Wt 163.0 lb

## 2024-05-06 DIAGNOSIS — G8929 Other chronic pain: Secondary | ICD-10-CM | POA: Diagnosis not present

## 2024-05-06 DIAGNOSIS — M545 Low back pain, unspecified: Secondary | ICD-10-CM

## 2024-05-06 DIAGNOSIS — M4712 Other spondylosis with myelopathy, cervical region: Secondary | ICD-10-CM | POA: Diagnosis not present

## 2024-05-06 NOTE — Progress Notes (Signed)
 Orthopedic Spine Surgery Office Note  Assessment: Patient is a 73 y.o. male with 2 issues:  Chronic low back pain with no radicular symptoms Trouble with fine motor skills in the hand and unsteadiness with positive Romberg and hyperreflexia at the patella and grip and release test, possible cervical myelopathy   Plan: -For his low back, recommend core strengthening and can use tylenol  1000mg  up to three times per day - Patient has signs and symptoms of cervical myelopathy, so ordered an MRI to evaluate for that condition -Patient should return to office in 4 weeks, x-rays at next visit: AP/lateral/flex/ex cervical   Patient expressed understanding of the plan and all questions were answered to the patient's satisfaction.   ___________________________________________________________________________   History:  Patient is a 73 y.o. male who presents today for cervical and lumbar spine.  Patient reports neck and lower back pain.  He has had this for several years and has gotten progressively worse with time.  His neck pain is worse than his back pain.  He also feels that the neck pain radiates down to his lumbar spine.  He has no pain radiating into his lower extremities.  He has pain radiating into his bilateral shoulders.  It does not radiate past the shoulders.  He will sometimes notice decreased sensation in his hands.  There is no trauma or injury that preceded the onset of his pain.   Weakness: Yes, feels his hands are weaker and will be drop objects.  Feels his shoulders are weaker as well particular with abduction.  He is also noticed hip flexion weakness.  No other weakness noted Difficulty with fine motor skills (e.g., buttoning shirts, handwriting): Yes, has been dropping objects and had difficulty with buttoning shirts Symptoms of imbalance: Yes, he has been using a cane and feels unsteady on his feet.  Needs to use a handrail when going upstairs Paresthesias and numbness: Yes,  sometimes has decree sensation in his hands distal to the wrist.  No other numbness or paresthesias Bowel or bladder incontinence: Denies Saddle anesthesia: Denies  Treatments tried: Tylenol , prednisone   Review of systems: Denies fevers and chills, night sweats, unexplained weight loss, history of cancer.  Has had pain that wakes him at night  Past medical history: HTN HLD CAD GERD CKD TIA Rheumatoid arthritis Chronic pain Angina Bipolar BPH  Allergies: methotrexate, NSAIDs, plaquenil, statins, azulfidine, iodine, ranexa , ranolzaine  Past surgical history:  Right clavicle fracture ORIF Right ulnar artery pseudoaneurysm repair  Social history: Denies use of nicotine product (smoking, vaping, patches, smokeless) Alcohol use: Denies Denies recreational drug use   Physical Exam:  BMI of 25.5  General: no acute distress, appears stated age Neurologic: alert, answering questions appropriately, following commands Respiratory: unlabored breathing on room air, symmetric chest rise Psychiatric: appropriate affect, normal cadence to speech   MSK (spine):  -Strength exam      Left  Right Grip strength                5/5  5/5 Interosseus   5/5   5/5 Wrist extension  5/5  5/5 Wrist flexion   5/5  5/5 Elbow flexion   4/5  4/5 Deltoid    3/5  3/5  EHL    4/5  4/5 TA    5/5  5/5 GSC    5/5  5/5 Knee extension  5/5  5/5 Hip flexion   4/5  4/5  -Sensory exam    Sensation intact to light touch in L3-S1 nerve  distributions of bilateral lower extremities  Sensation intact to light touch in C5-T1 nerve distributions of bilateral upper extremities  -Brachioradialis DTR: 2/4 on the left, 2/4 on the right -Biceps DTR: 2/4 on the left, 2/4 on the right -Achilles DTR: 2/4 on the left, 2/4 on the right -Patellar tendon DTR: 3/4 on the left, 3/4 on the right  -Spurling: Negative bilaterally -Hoffman sign: Negative bilaterally -Clonus: No beats bilaterally -Interosseous  wasting: None seen -Grip and release test: Positive -Romberg: Positive -Gait: Unsteady and wide-based -Imbalance with tandem gait: Yes  Left shoulder exam: No pain through range of motion Right shoulder exam: No pain through range of motion  Imaging: XRs of the lumbar spine from 05/06/2024 were independently reviewed and interpreted, showing small anterior osteophyte formation at L3/4.  No other significant degenerative changes seen.  No fracture or dislocation noted.  No evidence of instability on flexion/tension views.   Patient name: Noah Grant Patient MRN: [id number] Date of visit: 05/06/24

## 2024-05-09 ENCOUNTER — Ambulatory Visit

## 2024-05-09 DIAGNOSIS — M542 Cervicalgia: Secondary | ICD-10-CM

## 2024-05-09 DIAGNOSIS — G8929 Other chronic pain: Secondary | ICD-10-CM | POA: Diagnosis not present

## 2024-05-09 DIAGNOSIS — M47812 Spondylosis without myelopathy or radiculopathy, cervical region: Secondary | ICD-10-CM | POA: Diagnosis not present

## 2024-05-09 DIAGNOSIS — M4802 Spinal stenosis, cervical region: Secondary | ICD-10-CM | POA: Diagnosis not present

## 2024-05-09 DIAGNOSIS — M5021 Other cervical disc displacement,  high cervical region: Secondary | ICD-10-CM | POA: Diagnosis not present

## 2024-05-09 DIAGNOSIS — M4712 Other spondylosis with myelopathy, cervical region: Secondary | ICD-10-CM

## 2024-05-11 ENCOUNTER — Other Ambulatory Visit: Payer: Self-pay | Admitting: Family Medicine

## 2024-05-16 ENCOUNTER — Other Ambulatory Visit: Payer: Self-pay | Admitting: Family Medicine

## 2024-05-19 ENCOUNTER — Encounter: Payer: Self-pay | Admitting: Family Medicine

## 2024-05-26 ENCOUNTER — Emergency Department (HOSPITAL_BASED_OUTPATIENT_CLINIC_OR_DEPARTMENT_OTHER)

## 2024-05-26 ENCOUNTER — Encounter (HOSPITAL_BASED_OUTPATIENT_CLINIC_OR_DEPARTMENT_OTHER): Payer: Self-pay | Admitting: Emergency Medicine

## 2024-05-26 ENCOUNTER — Other Ambulatory Visit: Payer: Self-pay

## 2024-05-26 ENCOUNTER — Emergency Department (HOSPITAL_BASED_OUTPATIENT_CLINIC_OR_DEPARTMENT_OTHER)
Admission: EM | Admit: 2024-05-26 | Discharge: 2024-05-26 | Disposition: A | Discharge status: Home / Self Care | Attending: Emergency Medicine | Admitting: Emergency Medicine

## 2024-05-26 DIAGNOSIS — N1831 Chronic kidney disease, stage 3a: Secondary | ICD-10-CM | POA: Insufficient documentation

## 2024-05-26 DIAGNOSIS — I129 Hypertensive chronic kidney disease with stage 1 through stage 4 chronic kidney disease, or unspecified chronic kidney disease: Secondary | ICD-10-CM | POA: Insufficient documentation

## 2024-05-26 DIAGNOSIS — R079 Chest pain, unspecified: Secondary | ICD-10-CM | POA: Diagnosis not present

## 2024-05-26 DIAGNOSIS — Z79899 Other long term (current) drug therapy: Secondary | ICD-10-CM | POA: Diagnosis not present

## 2024-05-26 DIAGNOSIS — Z87891 Personal history of nicotine dependence: Secondary | ICD-10-CM | POA: Insufficient documentation

## 2024-05-26 DIAGNOSIS — R0789 Other chest pain: Secondary | ICD-10-CM | POA: Diagnosis not present

## 2024-05-26 DIAGNOSIS — Z7982 Long term (current) use of aspirin: Secondary | ICD-10-CM | POA: Insufficient documentation

## 2024-05-26 DIAGNOSIS — I2510 Atherosclerotic heart disease of native coronary artery without angina pectoris: Secondary | ICD-10-CM | POA: Diagnosis not present

## 2024-05-26 DIAGNOSIS — S2241XA Multiple fractures of ribs, right side, initial encounter for closed fracture: Secondary | ICD-10-CM | POA: Diagnosis not present

## 2024-05-26 LAB — BASIC METABOLIC PANEL WITH GFR
Anion gap: 10 (ref 5–15)
BUN: 36 mg/dL — ABNORMAL HIGH (ref 8–23)
CO2: 21 mmol/L — ABNORMAL LOW (ref 22–32)
Calcium: 9.1 mg/dL (ref 8.9–10.3)
Chloride: 108 mmol/L (ref 98–111)
Creatinine, Ser: 1.83 mg/dL — ABNORMAL HIGH (ref 0.61–1.24)
GFR, Estimated: 39 mL/min — ABNORMAL LOW (ref >60)
Glucose, Bld: 124 mg/dL — ABNORMAL HIGH (ref 70–99)
Potassium: 5.1 mmol/L (ref 3.5–5.1)
Sodium: 139 mmol/L (ref 135–145)

## 2024-05-26 LAB — CBC
HCT: 30.6 % — ABNORMAL LOW (ref 39.0–52.0)
Hemoglobin: 9.7 g/dL — ABNORMAL LOW (ref 13.0–17.0)
MCH: 30.4 pg (ref 26.0–34.0)
MCHC: 31.7 g/dL (ref 30.0–36.0)
MCV: 95.9 fL (ref 80.0–100.0)
Platelets: 185 10*3/uL (ref 150–400)
RBC: 3.19 MIL/uL — ABNORMAL LOW (ref 4.22–5.81)
RDW: 12.6 % (ref 11.5–15.5)
WBC: 5.5 10*3/uL (ref 4.0–10.5)
nRBC: 0 % (ref 0.0–0.2)

## 2024-05-26 LAB — HEPATIC FUNCTION PANEL
ALT: 14 U/L (ref 0–44)
AST: 19 U/L (ref 15–41)
Albumin: 4.3 g/dL (ref 3.5–5.0)
Alkaline Phosphatase: 67 U/L (ref 38–126)
Bilirubin, Direct: 0.1 mg/dL (ref 0.0–0.2)
Indirect Bilirubin: 0.1 mg/dL — ABNORMAL LOW (ref 0.3–0.9)
Total Bilirubin: 0.2 mg/dL (ref 0.0–1.2)
Total Protein: 6.4 g/dL — ABNORMAL LOW (ref 6.5–8.1)

## 2024-05-26 LAB — TROPONIN T, HIGH SENSITIVITY
Troponin T High Sensitivity: 21 ng/L — ABNORMAL HIGH (ref <19)
Troponin T High Sensitivity: <15 ng/L (ref <19)

## 2024-05-26 LAB — LIPASE, BLOOD: Lipase: 70 U/L — ABNORMAL HIGH (ref 11–51)

## 2024-05-26 NOTE — ED Provider Notes (Signed)
 [place] [facility] Provider Note   CSN: [id number] Arrival date & time: 05/26/24  1233     Patient presents with: Chest Pain   Noah Grant is a 73 y.o. male.    Chest Pain   73 year old male presents emergency department with complaints of chest pain.  Patient reports episode of chest pain last night when he was sitting down at home.  Chest pain began around 730 and lasted for about an hour.  States he took 3 sublingual nitroglycerin  which did help his symptoms.  Patient reports history of CAD with stent placement.  States that he deals with chronic chest pain and sometimes has to take nitroglycerin  multiple times daily for chest pain and at other times, goes a few days without taking any nitroglycerin  at all.  Denies any concurrent shortness of breath, diaphoresis, nausea, vomiting.  States that sometimes his symptoms are worsened with exertion and sometimes they occur at random times.  States he does have an appoint with cardiology next week.  States that he did not want to come into the hospital but his wife told him to be evaluated.  Past medical history significant for CVA, hyperlipidemia, myocarditis, atrial fibrillation not on anticoagulation., CAD, bipolar 1 disorder, GERD, GAD, CKD 3A, Barrett's esophagus, BPH, rheumatoid arthritis, PAD, sepsis  Prior to Admission medications   Medication Sig Start Date End Date Taking? Authorizing Provider  acetaminophen  (TYLENOL ) 650 MG CR tablet Take 1,300 mg by mouth every 8 (eight) hours as needed for pain.    [provider]  amLODipine  (NORVASC ) 10 MG tablet Take 1 tablet (10 mg total) by mouth daily. 09/22/23   Jobe Mulder, DO  aspirin  EC 81 MG tablet Take 1 tablet (81 mg total) by mouth daily. Swallow whole. 06/24/23   Rhyne, Samantha J, PA-C  Cholecalciferol 50 MCG (2000 UT) CAPS Take 3,000 Units by mouth daily.    [provider]  Coenzyme Q10 (COQ10) 200 MG CAPS  Take 200 mg by mouth daily.    [provider]  diclofenac  Sodium (VOLTAREN ) 1 % GEL Apply 1 application  topically daily as needed for pain.    [provider]  ezetimibe  (ZETIA ) 10 MG tablet TAKE 1 TABLET BY MOUTH DAILY 03/16/24   Meng, Hao, PA  famotidine  (PEPCID ) 40 MG tablet Take 1 tablet (40 mg total) by mouth daily. 03/22/24   Jobe Mulder, DO  finasteride  (PROSCAR ) 5 MG tablet Take 1 tablet (5 mg total) by mouth daily. 03/22/24   Jobe Mulder, DO  hydrALAZINE  (APRESOLINE ) 100 MG tablet Take 100 mg by mouth 3 (three) times daily.    [provider]  leflunomide  (ARAVA ) 20 MG tablet Take 1 tablet (20 mg total) by mouth daily. 03/30/24   Matt Song, MD  levothyroxine  (SYNTHROID ) 25 MCG tablet Take 1 tablet (25 mcg total) by mouth daily before breakfast. 05/17/24   [name], Shellie Dials, DO  lithium  carbonate 150 MG capsule Take 3 capsules (450 mg total) by mouth daily. 03/08/24   Cottle, Kennedy Peabody., MD  Lysine  HCl 1000 MG TABS Take 1,000 mg by mouth 2 (two) times daily.     [provider]  nitroGLYCERIN  (NITROSTAT ) 0.4 MG SL tablet Place 1 tablet (0.4 mg total) under the tongue every 5 (five) minutes as needed for chest pain. 04/04/23   Revankar, Micael Adas, MD  olmesartan  (BENICAR ) 40 MG tablet TAKE 1 TABLET BY MOUTH DAILY 03/15/24   Gwenette Lennox,  Shellie Dials, DO  omeprazole  (PRILOSEC) 40 MG capsule Take 1 capsule (40 mg total) by mouth 2 (two) times daily before a meal. 05/11/24   [name], Shellie Dials, DO  predniSONE  (DELTASONE ) 5 MG tablet Take 1 tablet (5 mg total) by mouth daily as needed. 03/30/24   Matt Song, MD  simvastatin  (ZOCOR ) 5 MG tablet TAKE 1 TABLET BY MOUTH DAILY 03/15/24   Jobe Mulder, DO  triamcinolone  cream (KENALOG ) 0.1 % Apply 1 Application topically 2 (two) times daily. 06/13/23   Tonya Fredrickson, MD  vitamin B-12 (CYANOCOBALAMIN ) 1000 MCG tablet Take 1,000 mcg by mouth daily.    [provider]    Allergies: Methotrexate, Nsaids, Plaquenil [hydroxychloroquine], Azulfidine [sulfasalazine], Crestor  [rosuvastatin ], Iodinated contrast media, Isosorbide , Mevacor  [lovastatin ], Pravachol  [pravastatin ], and Ranexa  [ranolazine  er]    Review of Systems  Cardiovascular:  Positive for chest pain.  All other systems reviewed and are negative.   Updated Vital Signs BP (!) 154/69 (BP Location: Right Arm)   Pulse 63   Temp 98.2 F (36.8 C)   Resp 18   Ht 5' 7 (1.702 m)   Wt 76.7 kg   SpO2 100%   BMI 26.47 kg/m   Physical Exam Vitals and nursing note reviewed.  Constitutional:      General: He is not in acute distress.    Appearance: He is well-developed.  HENT:     Head: Normocephalic and atraumatic.   Eyes:     Conjunctiva/sclera: Conjunctivae normal.    Cardiovascular:     Rate and Rhythm: Normal rate and regular rhythm.     Pulses: Normal pulses.     Heart sounds: No murmur heard. Pulmonary:     Effort: Pulmonary effort is normal. No respiratory distress.     Breath sounds: Normal breath sounds. No wheezing, rhonchi or rales.     Comments: Mild anterior midsternal chest wall tenderness. Abdominal:     Palpations: Abdomen is soft.     Tenderness: There is no abdominal tenderness.   Musculoskeletal:        General: No swelling.     Cervical back: Neck supple.     Right lower leg: No edema.   Skin:    General: Skin is warm and dry.     Capillary Refill: Capillary refill takes less than 2 seconds.   Neurological:     Mental Status: He is alert.   Psychiatric:        Mood and Affect: Mood normal.     (all labs ordered are listed, but only abnormal results are displayed) Labs Reviewed  BASIC METABOLIC PANEL WITH GFR  CBC  TROPONIN T, HIGH SENSITIVITY    EKG: None  Radiology: No results found.   Procedures   Medications Ordered in the ED - No data to display                                  Medical Decision Making Amount  and/or Complexity of Data Reviewed Labs: ordered. Radiology: ordered.   This patient presents to the ED for concern of chest pain, this involves an extensive number of treatment options, and is a complaint that carries with it a high risk of complications and morbidity.  The differential diagnosis includes ACS, PE, pneumothorax, pericarditis/myocarditis/tamponade, GERD, aortic dissection, pneumonia, other   Co morbidities that complicate the patient evaluation  See HPI   Additional history obtained:  Additional  history obtained from EMR External records from outside source obtained and reviewed including hospital records   Lab Tests:  I Ordered, and personally interpreted labs.  The pertinent results include: Initial troponin of 21 with repeat less than 15.  No leukocytosis.  Baseline anemia with a hemoglobin of 9.7.  Platelets within normal range.  Mild decreased bicarb of 21 otherwise, chest within the limits.  Baseline renal dysfunction creatinine 1.83, BUN of 36 GFR of 39.  Lipase of 70.  No transaminitis.   Imaging Studies ordered:  I ordered imaging studies including chest x-ray I independently visualized and interpreted imaging which showed no acute cardiopulmonary abnormality I agree with the radiologist interpretation   Cardiac Monitoring: / EKG:  The patient was maintained on a cardiac monitor.  I personally viewed and interpreted the cardiac monitored which showed an underlying rhythm of: Sinus rhythm.  Right bundle branch block.  ST elevation lateral leads without reciprocal changes.   Consultations Obtained:  I requested consultation with  attending Dr. Florie Husband who independently evaluated the patient was agreement treatment plan going forward.   Problem List / ED Course / Critical interventions / Medication management  Chest pain Reevaluation of the patient showed that the patient stayed the same I have reviewed the patients home medicines and have made  adjustments as needed   Social Determinants of Health:  Former cigarette use.  Denies illicit drug use.   Test / Admission - Considered:  Chest pain Vitals signs significant for hypertension blood pressure 148/56. Otherwise within normal range and stable throughout visit. Laboratory/imaging studies significant for: See above 73 year old male presents emergency department with complaints of chest pain.  Patient reports episode of chest pain last night when he was sitting down at home.  Chest pain began around 730 and lasted for about an hour.  States he took 3 sublingual nitroglycerin  which did help his symptoms.  Patient reports history of CAD with stent placement.  States that he deals with chronic chest pain and sometimes has to take a few nitroglycerin  for chest pain and at other times, goes a few days/a week without taking any nitroglycerin  at all.  Denies any concurrent shortness of breath, diaphoresis, nausea, vomiting.  States that sometimes his symptoms are worsened with exertion and sometimes they occur at random times.  States he does have an appoint with cardiology next week.  States that he did not want to come into the hospital but his wife told him to be evaluated. On exam, slight tenderness right-sided chest wall.  Lungs clear to auscultation bilaterally.  No abdominal tenderness.  Workup today reassuring.  EKG without acute ischemic change; patient with initial troponin of 21 with second less than 15; lower suspicion for ACS.  Patient without tachycardia, tachypnea, hypoxia currently on Eliquis ; lower suspicion for PE.  Chest x-ray without obvious pneumonia, pneumothorax or other acute pulmonary abnormality.  Per cardiology notes in the outpatient setting, patient does deal with chronic chest pain and is failed Imdur  due to medication side effects.  Today's symptoms seem consistent with prior notes available in the outpatient setting.  Patient does state that he has very close follow-up  with cardiology sometime next week.  Will recommend keeping appointment following closely with cardiology for further assessment/evaluation with strict return precautions discussed.  Attending physician in agreement.  Treatment plan discussed with patient and he acknowledged understanding was agreeable to said plan.  Patient overall well-appearing, afebrile in no acute distress.   Worrisome signs and symptoms were discussed  with the patient, and the patient acknowledged understanding to return to the ED if noticed. Patient was stable upon discharge.       Final diagnoses:  None    ED Discharge Orders     None          Pickens Butter, [place] 05/26/24 1742    Afton Horse T, DO 05/27/24 1529

## 2024-05-26 NOTE — ED Triage Notes (Signed)
 Pt POV in wheelchair- c/o increased L chest pain and tightness since last night.  Took 3 sublingual nitroglycerin  last night with no relief, none taken today.   Denies n/v/diaphoresis.    Pt also reports baseline tremor, worsened since last night.

## 2024-05-26 NOTE — Discharge Instructions (Signed)
 Your workup today was reassuring.  Your enzymes were stable and EKG appeared roughly the same.  You may continue to take nitroglycerin  as needed for chest discomfort and follow-up with your cardiologist tomorrow.  Please not hesitate to return to emergency department if the worrisome signs and symptoms we discussed become apparent.

## 2024-06-02 ENCOUNTER — Encounter: Payer: Self-pay | Admitting: Cardiology

## 2024-06-02 ENCOUNTER — Ambulatory Visit: Payer: PPO | Attending: Cardiology | Admitting: Cardiology

## 2024-06-02 VITALS — BP 131/56 | HR 64 | Ht 67.0 in | Wt 164.0 lb

## 2024-06-02 DIAGNOSIS — I10 Essential (primary) hypertension: Secondary | ICD-10-CM | POA: Diagnosis not present

## 2024-06-02 DIAGNOSIS — I25118 Atherosclerotic heart disease of native coronary artery with other forms of angina pectoris: Secondary | ICD-10-CM | POA: Diagnosis not present

## 2024-06-02 DIAGNOSIS — Z789 Other specified health status: Secondary | ICD-10-CM

## 2024-06-02 DIAGNOSIS — D649 Anemia, unspecified: Secondary | ICD-10-CM

## 2024-06-02 DIAGNOSIS — I4891 Unspecified atrial fibrillation: Secondary | ICD-10-CM

## 2024-06-02 DIAGNOSIS — I2510 Atherosclerotic heart disease of native coronary artery without angina pectoris: Secondary | ICD-10-CM

## 2024-06-02 DIAGNOSIS — E785 Hyperlipidemia, unspecified: Secondary | ICD-10-CM | POA: Diagnosis not present

## 2024-06-02 DIAGNOSIS — E860 Dehydration: Secondary | ICD-10-CM | POA: Diagnosis not present

## 2024-06-02 DIAGNOSIS — I739 Peripheral vascular disease, unspecified: Secondary | ICD-10-CM | POA: Diagnosis not present

## 2024-06-02 DIAGNOSIS — I2089 Other forms of angina pectoris: Secondary | ICD-10-CM

## 2024-06-02 DIAGNOSIS — I25119 Atherosclerotic heart disease of native coronary artery with unspecified angina pectoris: Secondary | ICD-10-CM

## 2024-06-02 NOTE — Patient Instructions (Addendum)
 Medication Instructions:  Not needed  *If you need a refill on your cardiac medications before your next appointment, please call your pharmacy*   Lab Work: CBC If you have labs (blood work) drawn today and your tests are completely normal, you will receive your results only by: MyChart Message (if you have MyChart) OR A paper copy in the mail If you have any lab test that is abnormal or we need to change your treatment, we will call you to review the results.   Testing/Procedures: Recommend you having a PET SCAN at [facility]  .A cardiac PET scan is an accurate, noninvasive test that creates images of your heart. A healthcare provider can use these images to make decisions about the next steps in your heart care. They can judge how healthy your heart muscle is and decide how to treat it. A cardiac PET scan uses a small amount of radiation    Follow-Up: At [facility], you and your health needs are our priority.  As part of our continuing mission to provide you with exceptional heart care, we have created designated Provider Care Teams.  These Care Teams include your primary Cardiologist (physician) and Advanced Practice Providers (APPs -  Physician Assistants and Nurse Practitioners) who all work together to provide you with the care you need, when you need it.     Your next appointment:   4 month(s)  The format for your next appointment:   In Person  Provider:   Alm Clay, MD   Other Instructions      Please report to Radiology at the [facility] Main Entrance 30 minutes early for your test.  [address] [place], [place] [address]                         OR   Please report to Radiology at [facility] Main Entrance, medical mall, 30 mins prior to your test.  [address]  [place], [place]  How to Prepare for Your Cardiac PET/CT Stress Test:  Nothing to eat or drink, except water, 3 hours prior to arrival  time.  NO caffeine/decaffeinated products, or chocolate 12 hours prior to arrival. (Please note decaffeinated beverages (teas/coffees) still contain caffeine).  If you have caffeine within 12 hours prior, the test will need to be rescheduled.  Medication instructions: Do not take erectile dysfunction medications for 72 hours prior to test (sildenafil, tadalafil) Do not take nitrates (isosorbide  mononitrate, Ranexa ) the day before or day of test Do not take tamsulosin  the day before or morning of test Hold theophylline containing medications for 12 hours. Hold Dipyridamole 48 hours prior to the test.  You may take your remaining medications with water.  NO cologne or lotion on chest or abdomen area.  Total time is 1 to 2 hours; you may want to bring reading material for the waiting time.   In preparation for your appointment, medication and supplies will be purchased.  Appointment availability is limited, so if you need to cancel or reschedule, please call the Radiology Department Scheduler at [phone] 24 hours in advance to avoid a cancellation fee of $100.00  What to Expect When you Arrive:  Once you arrive and check in for your appointment, you will be taken to a preparation room within the Radiology Department.  A technologist or Nurse will obtain your medical history, verify that you are correctly prepped for the exam, and explain  the procedure.  Afterwards, an IV will be started in your arm and electrodes will be placed on your skin for EKG monitoring during the stress portion of the exam. Then you will be escorted to the PET/CT scanner.  There, staff will get you positioned on the scanner and obtain a blood pressure and EKG.  During the exam, you will continue to be connected to the EKG and blood pressure machines.  A small, safe amount of a radioactive tracer will be injected in your IV to obtain a series of pictures of your heart along with an injection of a stress agent.    After  your Exam:  It is recommended that you eat a meal and drink a caffeinated beverage to counter act any effects of the stress agent.  Drink plenty of fluids for the remainder of the day and urinate frequently for the first couple of hours after the exam.  Your doctor will inform you of your test results within 7-10 business days.  For more information and frequently asked questions, please visit our website: [email or web address]  For questions about your test or how to prepare for your test, please call: Cardiac Imaging Nurse Navigators Office: [phone]

## 2024-06-02 NOTE — Progress Notes (Unsigned)
 Cardiology Office Note:  .   Date:  06/03/2024  ID:  Noah Grant, DOB 1951/01/21, MRN [id number] PCP: Frann Mabel Mt, DO  [facility] Providers Cardiologist:  Alm Clay, MD Electrophysiologist:  Elspeth Sage, MD     Chief Complaint  Patient presents with   Follow-up    6-month follow-up.  Multiple complaints   Coronary Artery Disease    He does complain of some chest pain and has known CAD    Patient Profile: .     Noah Grant is a  73 y.o. male with a PMH reviewed below who presents here for 33-month follow-up with complaint of ongoing chest pain.  He had previously been followed by Dr. Revankar, but recently switched.-I saw him for the first time in clinic on December 17, 2023 Referring Provider: Frann Mabel Mt* PMH: CAD (08/28/2020) DES PCI to the PDA x 2 - 60% distal PDA Patent Stent Cath 05/2023 PAF - no Sx of recurrent Sx. 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  He noted that he was unable to tolerate Imdur  as it made him feel very weak.  Therefore stopped it.  His blood pressures been well-controlled.  Denied any real shortness of breath.  Chest comfort was not consistent with exertion.  Occurring over different location of the chest and maybe twice a week sometimes the neck then several times a week next week.  He also noted leg pain so ABIs were checked to evaluate for PAD.  Deferred evaluation of chest pain to his follow-up visit with me.  He was seen in the ER on 05/26/2024 by Fonda Law, MD for chest pain--troponins were ~20.  Unremarkable.  Was chest pain-free and EKG was normal.  Recommended close cardiology follow-up.  Subjective  Discussed the use of AI scribe software for clinical note transcription with the patient, who gave verbal consent to  proceed.  History of Present Illness History of Present Illness He experiences migratory chest pain accompanied by a sensation of tightness, occurring unpredictably both at rest and during physical activity, without a specific trigger. The pain is located in various areas of the chest.  He has significant shortness of breath, particularly when walking uphill or on an incline, such as the street he lives on. He feels 'short of breath' and 'just give out' when attempting to walk uphill. He also experiences shortness of breath when lying down at night and occasionally wakes up due to difficulty breathing. No heart racing or palpitations. He has had swelling in his legs in the past, but not recently.  He frequently feels lightheaded and cold, and experiences fatigue that severely impacts his daily activities, describing his day as 'eat, go to bed, eat, go to bed'. He also experiences shaking, which worsens with fatigue. He has a history of anemia, which he attributes to his rheumatoid arthritis and its treatment. Recent lab work in the ER showed anemia and dehydration.  He reports hearing a 'wonk, ernestine ernestine' sound in his head, similar to the sound of a text on a machine.  He has a history of rheumatoid arthritis for over twenty years, which he believes contributes to his fatigue. He is currently experiencing back problems and is awaiting an MRI. He has been on multiple medications for rheumatoid arthritis, including leflunomide , Humira,  Actemra , methotrexate, and currently takes Plafenolide.  He is currently taking Zetia  and a low dose of simvastatin  for cholesterol management. His last cholesterol check in October 2024 showed a total cholesterol of 121, triglycerides of 197, LDL of 50, and HDL of 31.    Objective    Studies Reviewed: .        Results LABS Total cholesterol: 121 mg/dL (89/7975) Triglycerides: 197 mg/dL (89/7975) LDL: 50 mg/dL (89/7975) HDL: 31 mg/dL  (89/7975)  DIAGNOSTIC  ABIs: Peripheral arterial disease (PAD) test: Normal (02/27/2024)  CATH: Stable findings with minimal disease in the LAD segment.  Overlapping RPDA stents widely patent 60% stenosis distal to the stents.  Plan medical management.  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 Silver for pseudoaneurysm repair (06/13/2023 Echo: EF 60 to 65%.  No RWMA.  Moderate LVH and mild MR (01/02/2023)   Risk Assessment/Calculations:              Physical Exam:   VS:  BP (!) 131/56   Pulse 64   Ht 5' 7 (1.702 m)   Wt 164 lb (74.4 kg)   SpO2 96%   BMI 25.69 kg/m    Wt Readings from Last 3 Encounters:  06/02/24 164 lb (74.4 kg)  05/26/24 169 lb (76.7 kg)  05/06/24 163 lb (73.9 kg)    GEN: Well nourished, well groomed in no acute distress; somewhat pale but otherwise healthy-appearing NECK: No JVD; No carotid bruits CARDIAC: Normal S1, S2; RRR, no murmurs, rubs, gallops RESPIRATORY:  Clear to auscultation without rales, wheezing or rhonchi ; nonlabored, good air movement. ABDOMEN: Soft, non-tender, non-distended EXTREMITIES:  No edema; No deformity      ASSESSMENT AND PLAN: .    Problem List Items Addressed This Visit       Cardiology Problems   Atrial fibrillation with rapid ventricular response (HCC)   No further episodes since his myocarditis in 2023      Coronary artery disease involving native coronary artery of native heart with angina pectoris (HCC) (Chronic)   Coronary artery disease with previous stenting. Intermittent chest pain and dyspnea. Echocardiogram showed normal systolic function but myocardial thickening.  Recent ER visit indicated possible myocardial ischemia.  CT PET stress test discussed for evaluation given the previous concern of micro versus macrovascular disease. - Order CT PET Stress Test  - Reassess cardiac function and valve status if CT PET indicates abnormalities. - Continue aspirin  81 mg along  with his Zetia  and low-dose statin regimen. -On amlodipine  for antianginal benefit along with olmesartan  and hydralazine  for afterload reduction.  Adjusting hydralazine  dose because of dizziness.      Essential hypertension (Chronic)   Hypertension managed with hydralazine . Reports dizziness, possibly due to hypotension. Blood pressure management may require adjustment. - Reduce midday dose of hydralazine  to half a tablet. - If blood pressure remains stable, reduce morning dose of hydralazine  to half a tablet after two weeks.      Relevant Orders   EKG 12-Lead   CBC   NM PET CT CARDIAC PERFUSION MULTI W/ABSOLUTE BLOODFLOW   Hyperlipidemia with target low density lipoprotein (LDL) cholesterol less than 55 mg/dL (Chronic)   Currently on 5 mg simvastatin  along with co-Q10 200 mg and Zetia  10 mg. Most recent labs were from 09/2023: Well-controlled TC 121 and LDL 50.  Triglycerides a little elevated at 197 -Continue current therapy as he seems to be well-controlled.      PAD (peripheral artery  disease) (HCC)   Relevant Orders   EKG 12-Lead   CBC   NM PET CT CARDIAC PERFUSION MULTI W/ABSOLUTE BLOODFLOW     Other   Chest pain (Chronic)   Atypical chest pain with multiple different features. Has previously been evaluated with a cath showing patent stents.  This point we would like to look for noninvasive study first and 1 day could evaluate both macro and microvasculature.  Plan for Cardiac Stress PET      Dehydration   Recent ER visit indicated dehydration with elevated BUN and creatinine, exacerbating anemia and contributing to fatigue.      Normocytic anemia   Anemia likely secondary to rheumatoid arthritis and its treatment. Contributing to fatigue and dyspnea. Hemoglobin levels require monitoring. - Discuss anemia management with primary care physician and rheumatologist. - Consider evaluation for potential bleeding sources and review aspirin  use.      Statin intolerance  (Chronic)   Intolerant of higher dose of statin but able to tolerate 5 mg simvastatin  along with co-Q10 200 mg and Zetia  10 mg.       Other Visit Diagnoses       Coronary artery disease involving native coronary artery of native heart with other form of angina pectoris (HCC)    -  Primary   Relevant Orders   EKG 12-Lead   CBC   NM PET CT CARDIAC PERFUSION MULTI W/ABSOLUTE BLOODFLOW   Cardiac Stress Test: Informed Consent Details: Physician/Practitioner Attestation; Transcribe to consent form and obtain patient signature            Informed Consent   Shared Decision Making/Informed Consent The risks [chest pain, shortness of breath, cardiac arrhythmias, dizziness, blood pressure fluctuations, myocardial infarction, stroke/transient ischemic attack, nausea, vomiting, allergic reaction, radiation exposure, metallic taste sensation and life-threatening complications (estimated to be 1 in 10,000)], benefits (risk stratification, diagnosing coronary artery disease, treatment guidance) and alternatives of a cardiac PET stress test were discussed in detail with Mr. Pann and he agrees to proceed.      Follow-Up: Return in about 4 months (around 10/02/2024) for To discuss test results, [employer].     Signed, Alm MICAEL Clay, MD, MS Alm Clay, M.D., M.S. Interventional Chartered certified accountant  Pager # [phone]

## 2024-06-03 ENCOUNTER — Encounter: Payer: Self-pay | Admitting: Cardiology

## 2024-06-03 DIAGNOSIS — E860 Dehydration: Secondary | ICD-10-CM | POA: Insufficient documentation

## 2024-06-03 NOTE — Assessment & Plan Note (Signed)
 Atypical chest pain with multiple different features. Has previously been evaluated with a cath showing patent stents.  This point we would like to look for noninvasive study first and 1 day could evaluate both macro and microvasculature.  Plan for Cardiac Stress PET

## 2024-06-03 NOTE — Assessment & Plan Note (Addendum)
 Intolerant of higher dose of statin but able to tolerate 5 mg simvastatin  along with co-Q10 200 mg and Zetia  10 mg.

## 2024-06-03 NOTE — Assessment & Plan Note (Signed)
 No further episodes since his myocarditis in 2023

## 2024-06-03 NOTE — Assessment & Plan Note (Signed)
 Hypertension managed with hydralazine . Reports dizziness, possibly due to hypotension. Blood pressure management may require adjustment. - Reduce midday dose of hydralazine  to half a tablet. - If blood pressure remains stable, reduce morning dose of hydralazine  to half a tablet after two weeks.

## 2024-06-03 NOTE — Assessment & Plan Note (Signed)
 Anemia likely secondary to rheumatoid arthritis and its treatment. Contributing to fatigue and dyspnea. Hemoglobin levels require monitoring. - Discuss anemia management with primary care physician and rheumatologist. - Consider evaluation for potential bleeding sources and review aspirin  use.

## 2024-06-03 NOTE — Assessment & Plan Note (Signed)
 Recent ER visit indicated dehydration with elevated BUN and creatinine, exacerbating anemia and contributing to fatigue.

## 2024-06-03 NOTE — Assessment & Plan Note (Addendum)
 Coronary artery disease with previous stenting. Intermittent chest pain and dyspnea. Echocardiogram showed normal systolic function but myocardial thickening.  Recent ER visit indicated possible myocardial ischemia.  CT PET stress test discussed for evaluation given the previous concern of micro versus macrovascular disease. - Order CT PET Stress Test  - Reassess cardiac function and valve status if CT PET indicates abnormalities. - Continue aspirin  81 mg along with his Zetia  and low-dose statin regimen. -On amlodipine  for antianginal benefit along with olmesartan  and hydralazine  for afterload reduction.  Adjusting hydralazine  dose because of dizziness.

## 2024-06-03 NOTE — Assessment & Plan Note (Signed)
 Currently on 5 mg simvastatin  along with co-Q10 200 mg and Zetia  10 mg. Most recent labs were from 09/2023: Well-controlled TC 121 and LDL 50.  Triglycerides a little elevated at 197 -Continue current therapy as he seems to be well-controlled.

## 2024-06-07 ENCOUNTER — Ambulatory Visit: Admitting: Orthopedic Surgery

## 2024-06-07 ENCOUNTER — Other Ambulatory Visit (INDEPENDENT_AMBULATORY_CARE_PROVIDER_SITE_OTHER): Payer: Self-pay

## 2024-06-07 DIAGNOSIS — G8929 Other chronic pain: Secondary | ICD-10-CM

## 2024-06-07 DIAGNOSIS — M545 Low back pain, unspecified: Secondary | ICD-10-CM | POA: Diagnosis not present

## 2024-06-07 DIAGNOSIS — M4712 Other spondylosis with myelopathy, cervical region: Secondary | ICD-10-CM | POA: Diagnosis not present

## 2024-06-07 NOTE — Progress Notes (Signed)
 Orthopedic Spine Surgery Office Note   Assessment: Patient is a 73 y.o. 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 I told him typically PT or bracing does not work for this problem. His best options would be pain management or surgical correction. I explained that surgical correction comes with a chance of 30% of a major complication in an optimized patient. He did not feel that his pain is bad enough to warrant either of those interventions yet so he is going to continue to work on his posture, use tylenol , use voltaren  gel. He will let my office know if he changes his mind -Patient should return to office in 6 months, x-rays at next visit: AP/lateral/flex/ex cervical     Patient expressed understanding of the plan and all questions were answered to the patient's satisfaction.    ___________________________________________________________________________     History:   Patient is a 73 y.o. male who presents today for cervical and lumbar spine.  Patient still having low back pain. No pain radiating to his lower extremities. His pain is not as severe as his neck pain. He has neck pain all day but it is worse at the end of the day. He feels it in the lower cervical region. No pain radiating into either lower extremity. Reports issues still with his hands and clumsiness. Continues to use a cane for unsteadiness.    Treatments tried: tylenol , voltaren  gel, prednisone      Physical Exam:   General: no acute distress, appears stated age Neurologic: alert, answering questions appropriately, following commands Respiratory: unlabored breathing  on room air, symmetric chest rise Psychiatric: appropriate affect, normal cadence to speech     MSK (spine):   -Strength exam                                                   Left                  Right Grip strength                5/5                  5/5 Interosseus                  5/5                  5/5 Wrist extension            5/5                  5/5 Wrist flexion                 5/5                  5/5 Elbow flexion                4/5                  4/5  Deltoid                          3/5                  3/5   EHL                              4/5                  4/5 TA                                 5/5                  5/5 GSC                             5/5                  5/5 Knee extension            5/5                  5/5 Hip flexion                    4/5                  4/5   -Sensory exam                           Sensation intact to light touch in L3-S1 nerve distributions of bilateral lower extremities             Sensation intact to light touch in C5-T1 nerve distributions of bilateral upper extremities   -Brachioradialis DTR: 2/4 on the left, 2/4 on the right -Biceps DTR: 2/4 on the left, 2/4 on the right -Achilles DTR: 2/4 on the left, 2/4 on the right -Patellar tendon DTR: 3/4 on the left, 3/4 on the right   -Spurling: Negative bilaterally -Hoffman sign: Negative bilaterally -Clonus: No beats bilaterally -Interosseous wasting: None seen -Grip and release test: Negative -Romberg: Positive -Gait: Wide-based   Left shoulder exam: No pain through range of motion Right shoulder exam: No pain through range of motion   Imaging: XRs of the cervical spine from 06/07/2024 were independently reviewed and interpreted, showing disc height loss at C6/7 with anterior osteophyte formation. Cervical positive SVA of 6cm. C2 slope of 27 degrees. No evidence of instability on flexion/extension views. No fracture or dislocation seen.   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 No significant central or foraminal stenosis. No T2 cord signal change seen.    XRs of the lumbar spine from 05/06/2024 were previously independently reviewed and interpreted, showing small anterior osteophyte formation at L3/4.  No other significant degenerative changes seen.  No fracture or dislocation noted.  No evidence of instability on flexion/tension views.      Patient name: Noah Grant Patient MRN: [id number] Date of visit: 06/07/24

## 2024-06-15 NOTE — Progress Notes (Signed)
 Office Visit Note  Patient: Noah Grant             Date of Birth: 1951-06-26           MRN: [id number]             PCP: Frann Mabel Mt, DO Referring: Frann Mabel Mt* Visit Date: 06/29/2024   Subjective:  Follow-up (bilateral hand swelling... pain in jaw.. bilateral hip pain and weakness.. bilateral shoulder pain and stiffness, bilateral feet  pain )    Discussed the use of AI scribe software for clinical note transcription with the patient, who gave verbal consent to proceed.  History of Present Illness   Noah Grant is a 73 y.o. 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 daily.  He experiences significant stiffness and loss of function in his hands, stating he is 'losing the use of my hands' and cannot pick up pills or hold objects effectively. Persistent pain and stiffness are also present in his shoulders and hips, described as 'hurt like the devil.' His feet are painful, and he is unsure of the cause. He has tried various arthritis medications in the past, without significant relief. He is currently taking leflunomide  20 mg and uses Voltaren  gel on his back and hips and a lidocaine  patch on his lower back. He discontinued prednisone  due to side effects especially worsening his mood with irritability.  He has been experiencing jaw fatigue and drooling for about a year, with symptoms gradually worsening. His jaw gets tired and painful when chewing, requiring frequent pauses. The drooling occurs at the corners of his mouth. No visual changes such as double vision or blurriness, and no headaches.   He reports a decline in kidney function over the past year, with fluctuating levels. He mentions a history of high blood pressure as a potential cause, but recent urine tests were normal.  He is on blood-thinning medication and has a history of bruising easily, experiencing intermittent bruising, particularly on his right hand, which he  attributes to possible trauma.  He is scheduled for a PET scan on September 3rd. He has undergone previous cardiac evaluations, including stents and catheterization.  He experiences stiffness in his legs and difficulty with mobility, noting that his legs are 'so stiff.' He attempts to do stretching exercises but finds them challenging. NSome swelling in his hands but not significantly in his legs. No recent changes in a rash on his legs.       Previous HPI 03/30/2024 Noah Grant is a 73 y.o. 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 daily and prednisone  5 mg daily as needed.     He has ongoing issues with rheumatoid arthritis, primarily affecting his shoulders, hips, and hands. He experiences significant difficulty with fine motor tasks, such as picking up and holding objects, and frequently drops items. Loading his pill box weekly is particularly challenging. He continues to take leflunomide  and uses steroids as needed, though prednisone  makes him irritable.   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 The pain is described as aching and is associated with stiffness and soreness, particularly after sitting for extended periods. He finds it difficult to rise from furniture and benefits from a chair-height toilet. He has limited shoulder movement and experiences soreness in the shoulder joint itself, though the pain does not radiate down  the arm.   He has developed Raynaud's phenomenon, jokingly referring to himself as a 'smurf.' He experiences significant fatigue and lightheadedness, particularly when walking uphill or on inclines, though he manages downhill walking better. He underwent vascular studies to rule out peripheral artery disease, which were negative. He has seen a cardiologist and is scheduled for a follow-up in June.   No recent illnesses such as upper respiratory infections or  gastrointestinal issues. He has not experienced any stomach or gut problems with leflunomide . Muscle relaxers have been used in the past but provide limited relief and cause drowsiness, so he only takes them at night. Joint injections have been tried previously with minimal benefit.   Previous HPI 12/31/2023 Noah Grant is a 73 y.o. 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 daily and prednisone  5 mg daily as needed.  He presents with worsening pain and discomfort in multiple areas. They report severe foot pain, particularly at night, and increasing hip pain that radiates into the leg muscles. The patient also notes worsening 'duck feet,' with their feet turning outward more than before.   The patient's hands have been turning blue several times a day, primarily under the fingernails, and the tips of their fingers are numb and tingly. They report a loss of hand function, with difficulty picking up and holding items. Their primary care provider suggested this might be Raynaud's disease.   The patient also reports persistent shoulder pain. Despite these issues, the patient states they feel really good overall, except for the aforementioned symptoms.   The patient's medications include amlodipine , olmesartan , and hydralazine  for their cardiovascular condition. They also take leflunomide  and prednisone  for rheumatoid arthritis, and occasionally use over-the-counter Tylenol  for pain.   The patient has a history of atrial fibrillation but has not had any clots or recurrence of the condition since their hospitalization for myocarditis. They were previously on Eliquis  and Plavix , but are currently on low-dose aspirin .   The patient also has a hiatal hernia and Barrett's disease, and has been struggling with severe acid reflux despite taking omeprazole  twice daily. They are due to start on famotidine  in addition to the omeprazole .   The patient has not been on any muscle  relaxants or neuropathy medications recently, but has previously taken gabapentin , which they stopped due to sedation. They also report a reaction to isosorbide , which caused lethargy and joint pain.   The patient's blood pressure has stabilized with their current medication regimen. They have not been sick recently, but had a bladder infection treated with antibiotics about five to six months ago. They are due to see a gastroenterologist next month for their hiatal hernia and Barrett's disease.        Previous HPI 09/30/2023 Noah Grant is a 73 y.o. 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 daily and prednisone  5 mg daily as needed.  Continues to experience daily joint pain and stiffness worst complaints in his hands and hips.  He feels there is an increase in hand swelling since the last visit.  Addition of 5 mg prednisone  he does not feel made a very big difference and is only taking occasionally.  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  In his left hand experiences pain near the base of the third finger and frequently has pain and getting stuck first thing in the morning. Besides arthritis he complains of generalized fatigue and he  has to pause after short periods of exertion although he does not feel any specific change in breathing.  He still having frequent chest pain taking oral nitrates up to twice daily.   Previous HPI 06/30/2023 Noah Grant is a 73 y.o. 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 daily since our last visit still has a lot of joint pain limiting his activities uses a cane for support which decreases leg pain.  Still has a lot of shoulder pain especially with trying to reach overhead or behind his back and bilateral hand pain especially in the MCPs.  No specific flareup or exacerbation of swelling.  He did have heart catheterization through right ulnar artery that was  complicated by pseudoaneurysm had subsequent surgical repair for this.  Lateral shoulder and hip pain bothers him especially if he lies to his side at night.     Previous HPI 03/31/2023 Noah Grant is a 73 y.o. 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 daily we switch medications after last visit.  So far has not seen any significant difference in joint symptoms for better or worse.  He is tolerating the medication without noticeable side effect.  He had follow-up with his PCP office and discussed bilateral foot pain that was apparently unclear if related to peripheral neuropathy or from his rheumatoid arthritis.  Pain is mostly in the anterior half of the foot more severe along the top and across MTPs particularly bad early in the day when weightbearing or with direct pressure.  His mobility is also limited with bilateral hip pain.  Hurts worse on the side of the hips gets much worse after prolonged walking also gets pain if lying on either side in bed from direct pressure.   Previous HPI 01/29/23 Noah Grant is a 73 y.o. male here for follow up for seropositive RA on Actemra  162 mg subcu q. 14 days.  Overall he is feels very poorly like his arthritis is not benefiting at all from the treatment and mobility is worse than a few months ago.  He stopped some cardiac medications with improvement in his dizziness and leg swelling and fatigue.  Recent 2D echo look normal with no evidence of effusion or reduced ejection fraction from previous myocarditis episode.  He has ongoing bilateral shoulder pain with stiffness that remains throughout the entire day.  He is using a cane for offloading due to pain affecting hips and feet on both sides worse while walking.  He is seeing some diffuse swelling throughout the hands this appears worse at the end of the day does not localize to any particular joints.  Does cause difficulty fully closing his grip.   Previous  HPI 10/29/22 Noah Grant is a 73 y.o. 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  He completed the colchicine  and is now on just the Actemra  treatment.  He is feeling very fatigued limiting his overall activity level.  He is also been experiencing some additional pain and stiffness in multiple areas.  He had increased swelling in both legs accumulating towards the feet and ankles.  He has had adjustment to beta blocker treatment currently prescribed metoprolol  XR 25 mg daily. Bradycardia and heart rate improved now often in the 50s did not notice any difference in the peripheral swelling after the medicine change. He is also still on norvasc  and hydralazine  for  blood pressure control.   Previous HPI 05/30/22 Noah Grant is a 73 y.o. male here for follow up for RA he was on leflunomide  recently hospitalized with sepsis and pericarditis without clear underlying infection identified.  Symptoms started with chills developed increasing swelling in his hands and feet with joint pain that was worse in the shoulders and hips.  He developed severe chest pain felt a constriction or tightness around the heart.  The chest pain was sharp did report provocation with deep breaths or lying in left or right positions.  Evaluation was consistent with pericarditis with effusion.  He was discharged on colchicine  and received short term steroids and the chest pain is currently doing well.  The flareup of joint pain has also improved with the oral anti-inflammatory medication.   Previous HPI 04/19/22 Noah Grant is a 73 y.o. male here for rheumatoid arthritis previously seeing Dr. Ziolkowska and on treatment with leflunomide .  Symptoms started a long time ago gradual onset but has been seeing trucks for treatment of this in the past decade or so.  He has joint pain and stiffness involving multiple areas.  Shoulders, wrists, hands, also bilateral hip pains.   Most commonly sees visible swelling or inflammation in his hands.  He has morning stiffness very severe for 30 minutes to an hour but keeps persistent joint stiffness throughout the day.  Also has pain increased when trying to lie still at night.  He has tried a number of treatments for this over multiple years.  He did not tolerate hydroxychloroquine due to confusion, MTX not tolerated, leflunomide  leukopenia, and sulfasalazine mood disturbances.  He never experienced a significant improvement with Humira injection and developed acute conjunctivitis shortly after starting Remicade  infusion.   DMARD Hx LEF current MTX GI intolerance HCQ Psychiatric side effects SSZ Psychiatric side effects Humira nonresponder Remicade  nonresponder and conjunctivitis   12/2021 Cholesterol 107 TGs 193 HDL 28.9   09/2021 HBV neg HCV neg   Review of Systems  Constitutional:  Positive for fatigue.  HENT:  Negative for mouth sores and mouth dryness.   Eyes:  Negative for dryness.  Respiratory:  Positive for shortness of breath.   Cardiovascular:  Positive for chest pain. Negative for palpitations.  Gastrointestinal:  Positive for constipation and diarrhea. Negative for blood in stool.  Endocrine: Positive for increased urination.  Genitourinary:  Negative for involuntary urination.  Musculoskeletal:  Positive for joint pain, gait problem, joint pain, joint swelling, myalgias, muscle weakness, morning stiffness, muscle tenderness and myalgias.  Skin:  Negative for color change, rash, hair loss and sensitivity to sunlight.  Allergic/Immunologic: Negative for susceptible to infections.  Neurological:  Positive for dizziness. Negative for headaches.  Hematological:  Negative for swollen glands.  Psychiatric/Behavioral:  Positive for sleep disturbance. Negative for depressed mood. The patient is not nervous/anxious.     PMFS History:  Patient Active Problem List   Diagnosis Date Noted   Dehydration  06/03/2024   Chronic fatigue 12/21/2023   Trigger finger, left middle finger 09/30/2023   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   Hyperphosphatemia 10/30/2022   Lithium  use 10/30/2022   Anemia 09/02/2022   Atrial fibrillation (HCC) 09/02/2022   Stasis dermatitis of both legs 07/29/2022   Myocarditis (HCC) 05/14/2022   Pericarditis 05/12/2022   Atrial fibrillation with rapid ventricular response (HCC) 05/12/2022   Sepsis (HCC) 05/11/2022   Elevated troponin level not due myocardial infarction 05/11/2022   Lactic acidosis 05/11/2022   Drug therapy 05/18/2021   Normocytic anemia  Arthritis    Bipolar 1 disorder (HCC)    Chicken pox    Chronic bronchitis (HCC)    Depression    Hyperlipidemia with target low density lipoprotein (LDL) cholesterol less than 55 mg/dL    Rheumatic fever    Essential hypertension    Chest pain 09/06/2020   Coronary artery disease involving native coronary artery of native heart with angina pectoris (HCC) 09/06/2020   Angina pectoris (HCC) 08/30/2020   Cardiac murmur 08/30/2020   Statin intolerance 07/31/2020   Abdominal aortic atherosclerosis (HCC)    Ingrowing nail 03/12/2020   Bipolar affective disorder, currently depressed, moderate (HCC) 03/08/2020   Brain ischemia 03/08/2020   Cognitive complaints 03/08/2020   History of stroke 11/10/2019   Chronic kidney disease, stage 3a (HCC) 07/21/2019   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   Thoracic radiculopathy 06/16/2019   Intention tremor 06/16/2019   Right-sided chest wall pain 04/17/2019   Barrett's esophagus without dysplasia 03/25/2019   History of colon polyps 03/25/2019   PAD (peripheral artery disease) (HCC) 03/23/2019   Benign prostatic hyperplasia with nocturia 12/29/2018   BPH with urinary obstruction 12/29/2018   GAD (generalized anxiety disorder) 11/02/2018   Rheumatoid arthritis (HCC) 10/29/2018   Lichen planus 04/07/2018   High risk medication use 03/23/2018   Seasonal allergies  03/23/2018   Long-term use of immunosuppressant medication 03/23/2018   Low testosterone 09/17/2016   Vitamin B12 deficiency 09/17/2016   Vitamin D  deficiency 09/17/2016   Disc degeneration, 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   Pain of right hip joint 01/19/2016   Herpes gingivostomatitis 02/10/2015   Midline thoracic back pain 02/10/2015   Normochromic normocytic anemia 08/16/2014   Bipolar 1 disorder, mixed, moderate (HCC) 08/16/2014   Gastroesophageal reflux disease 08/16/2014   Bipolar depression (HCC) 08/16/2014   GERD without esophagitis 08/16/2014   Bipolar 2 disorder (HCC) 08/16/2014    Past Medical History:  Diagnosis Date   Abdominal aortic atherosclerosis (HCC)    Anemia    likely of chronic disease   Angina pectoris (HCC) 08/30/2020   Arthritis    rheumatoid   Atrial fibrillation (HCC)    Atrial fibrillation with rapid ventricular response (HCC) 05/12/2022   06/20/23 - pt states he's not had any A-fib   Barrett's esophagus without dysplasia 03/25/2019   Formatting of this note might be different from the original. EGD done in 02/2019. Due in 3 years.   Benign prostatic hyperplasia with nocturia 12/29/2018   Bipolar 1 disorder (HCC)    Bipolar 1 disorder, mixed, moderate (HCC) 08/16/2014   Bipolar 2 disorder (HCC) 08/16/2014   Bipolar affective disorder, currently depressed, moderate (HCC) 03/08/2020   Bipolar depression (HCC) 08/16/2014   BPH with urinary obstruction 12/29/2018   Brain ischemia 03/08/2020   Cardiac murmur 08/30/2020   Chest pain 09/06/2020   Chicken pox    Chronic bronchitis (HCC)    Chronic kidney disease, stage 3a (HCC) 07/21/2019   Cognitive complaints 03/08/2020   Coronary artery disease involving native coronary artery of native heart 09/06/2020   Coronary artery disease involving native coronary artery of native heart with angina pectoris (HCC) 09/06/2020   Depression    Disc degeneration, 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   Drug therapy 05/18/2021   Elevated troponin  level not due myocardial infarction 05/11/2022   Essential hypertension    GAD (generalized anxiety disorder) 11/02/2018   Gastroesophageal reflux disease 08/16/2014   Formatting of this note might be different from the original. Last Assessment & Plan:  Well controlled. Continue current medications.  GERD without esophagitis 08/16/2014   Formatting of this note might be different from the original. Last Assessment & Plan:  Well controlled. Continue current medications.   Herpes gingivostomatitis 02/10/2015   Hiatal hernia    High risk medication use 03/23/2018   History of colon polyps 03/25/2019   Formatting of this note might be different from the original. tubular adenoma   History of stroke 11/10/2019   Hyperlipidemia    Hyperphosphatemia 10/30/2022   Hypertension    Ingrowing nail 03/12/2020   Intention tremor 06/16/2019   Labile hypertension 08/16/2014   Formatting of this note might be different from the original. Last Assessment & Plan:  Slightly above goal today secondary to pain. Will continue current regimen. Will check CMP today.   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 Concern over possible lichen planus. His dentist noticed lesions of his oral mucosa.  It was felt to be lichen planus.  He presents to discuss further.  Rarely does the area hurt.He smoked in the distant past EXAM shows several white patches on the buccal mucosa bilaterally.  No other concerning oral mucosal les   Lithium  use 10/30/2022   Long-term use of immunosuppressant medication 03/23/2018   Low testosterone 09/17/2016   Midline thoracic back pain 02/10/2015   Mixed hyperlipidemia 08/16/2014   Myocarditis (HCC) 05/14/2022   Normochromic normocytic anemia 08/16/2014   Normocytic anemia    likely of chronic disease   PAD (peripheral artery disease) (HCC) 03/23/2019   Pain of right hip joint  01/19/2016   Pericarditis 05/12/2022   Raynaud's disease    Rheumatic fever    Rheumatoid arthritis (HCC) 10/29/2018   Formatting of this note might be different from the original. Rheum:  Dr. Ziolkowska Formerly on MTX - stopped due to mouth sores. Currently started on Arava .  Also on 5 mg prednisone  x 3 months during run-in.   Right-sided chest wall pain 04/17/2019   Seasonal allergies 03/23/2018   Sepsis (HCC) 05/11/2022   Stasis dermatitis of both legs 07/29/2022   Statin intolerance 07/31/2020   Stroke (HCC)    3 - TIA's   Task-specific dystonia of hand    right hand   Thoracic radiculopathy 06/16/2019   Vitamin B12 deficiency    Injections   Vitamin D  deficiency 09/17/2016    Family History  Problem Relation Age of Onset   Alzheimer's disease Mother 36       Deceased in early 29s   Stroke Father 13       Deceased   Hypertension Father    Alcoholism Father    Alcoholism Brother    Heart disease Maternal Grandfather 91       Deceased   Heart disease Paternal Grandfather 77       Deceased   Hypertension Son    Past Surgical History:  Procedure Laterality Date   CLAVICLE SURGERY     Right, Hardware placed   CORONARY STENT INTERVENTION N/A 09/06/2020   Procedure: CORONARY STENT INTERVENTION;  Surgeon: Wonda Sharper, MD;  [facility] INVASIVE CV LAB;  Service: Cardiovascular;  Laterality: N/A;   FALSE ANEURYSM REPAIR Right 06/23/2023   Procedure: RIGHT ULNAR ARTERY PSEUDOANEURYSM REPAIR;  Surgeon: Lanis Fonda BRAVO, MD;  Location: [facility] OR;  Service: Vascular;  Laterality: Right;   ingrown toenail Bilateral    great toes   LEFT HEART CATH AND CORONARY ANGIOGRAPHY N/A 09/06/2020  Procedure: LEFT HEART CATH AND CORONARY ANGIOGRAPHY;  Surgeon: Wonda Sharper, MD;  Location: [facility] INVASIVE CV LAB;  Service: Cardiovascular;  Laterality: N/A;   LEFT HEART CATH AND CORONARY ANGIOGRAPHY N/A 06/06/2023   Procedure: LEFT HEART CATH AND CORONARY ANGIOGRAPHY;  Surgeon: Anner Alm ORN, MD;  Location: [facility] INVASIVE CV LAB;  Service: Cardiovascular;  Laterality: N/A;   WISDOM TOOTH EXTRACTION     Social History   Social History Narrative   Not on file   Immunization History  Administered Date(s) Administered   Fluad  Quad(high Dose 65+) 09/03/2021, 09/04/2022, 08/29/2023   Influenza, High Dose Seasonal PF 09/10/2019, 09/03/2020   Influenza,inj,Quad PF,6+ Mos 08/22/2014, 10/04/2015   Moderna SARS-COV2 Booster Vaccination 10/31/2020, 07/04/2021   Moderna Sars-Covid-2 Vaccination 01/17/2020, 02/13/2020   PNEUMOCOCCAL CONJUGATE-20 01/02/2022   Pfizer(Comirnaty )Fall Seasonal Vaccine 12 years and older 09/18/2023   Zoster Recombinant(Shingrix) 07/02/2017, 09/02/2017   Zoster, Live 08/17/2011     Objective: Vital Signs: BP (!) 126/55 (BP Location: Left Arm, Patient Position: Sitting, Cuff Size: Normal)   Pulse (!) 51   Resp 14   Ht 5' 7 (1.702 m)   Wt 166 lb 12.8 oz (75.7 kg)   BMI 26.12 kg/m    Physical Exam Eyes:     Conjunctiva/sclera: Conjunctivae normal.  Cardiovascular:     Rate and Rhythm: Normal rate and regular rhythm.  Pulmonary:     Effort: Pulmonary effort is normal.     Breath sounds: Normal breath sounds.  Musculoskeletal:     Right lower leg: No edema.     Left lower leg: No edema.  Lymphadenopathy:     Cervical: No cervical adenopathy.  Skin:    General: Skin is warm and dry.  Neurological:     Mental Status: He is alert.     Comments: Tremor Cogwheel type rigidity in arms and legs  Psychiatric:        Mood and Affect: Mood normal.      Musculoskeletal Exam:  Neck full ROM no tenderness Decreased shoulder range of motion bilaterally, tenderness to pressure worse on anterior joints  Elbows full ROM no tenderness or swelling Wrists full ROM no tenderness or swelling Fingers full ROM, chronic MCP joint thickening no focal tenderness or palpable swelling Widespread low back tenderness to pressure, very restricted hip internal rotation  movement Tenderness to pressure on low back at SI joints, lateral hip tenderness to pressure on both sides, tenderness to pressure and stiffness along IT band Knees full ROM no tenderness or swelling, more limited by muscle stiffness proximal   Investigation: No additional findings.  Imaging: XR Cervical Spine With Flex & Extend Result Date: 06/07/2024 XRs of the cervical spine from 06/07/2024 were independently reviewed and interpreted, showing disc height loss at C6/7 with anterior osteophyte formation. Cervical positive SVA of 6cm. C2 slope of 27 degrees. No evidence of instability on flexion/extension views. No fracture or dislocation seen.    Recent Labs: Lab Results  Component Value Date   WBC 5.5 05/26/2024   HGB 9.7 (L) 05/26/2024   PLT 185 05/26/2024   NA 139 05/26/2024   K 5.1 05/26/2024   CL 108 05/26/2024   CO2 21 (L) 05/26/2024   GLUCOSE 124 (H) 05/26/2024   BUN 36 (H) 05/26/2024   CREATININE 1.83 (H) 05/26/2024   BILITOT 0.2 05/26/2024   ALKPHOS 67 05/26/2024   AST 19 05/26/2024   ALT 14 05/26/2024   PROT 6.4 (L) 05/26/2024   ALBUMIN 4.3 05/26/2024   CALCIUM   9.1 05/26/2024   GFRAA 75 11/28/2020   QFTBGOLDPLUS NEGATIVE 04/19/2022    Speciality Comments: No specialty comments available.  Procedures:  No procedures performed Allergies: Methotrexate, Nsaids, Plaquenil [hydroxychloroquine], Azulfidine [sulfasalazine], Crestor  [rosuvastatin ], Iodinated contrast media, Isosorbide , Mevacor  [lovastatin ], Pravachol  [pravastatin ], and Ranexa  [ranolazine  er]   Assessment / Plan:     Visit Diagnoses: Rheumatoid arthritis involving multiple sites, unspecified whether rheumatoid factor present (HCC) - Plan: Sedimentation rate, C-reactive protein Chronic rheumatoid arthritis with significant stiffness and pain. There is no peripheral joint synovitis or dactylitis apparent on exam. Previous treatments with limited success. Current symptoms likely related to degenerative disc  disease and bursitis.  Avoiding resuming prednisone  if possible due to poor tolerance with mood dysfunction.  Limited benefit from previous local steroid injections although no absolute contraindication. - Continue leflunomide  20 mg daily. - Recheck inflammatory markers.  High risk medication use - leflunomide  20 mg daily. - Plan: CBC with Differential/Platelet, Comprehensive metabolic panel with GFR, QuantiFERON-TB Gold Plus tolerating medication without reported complaint.  No serious interval infection. - Taking CBC and CMP for medication monitoring on continued long-term use of leflunomide - - Rechecking QuantiFERON with baseline team to resume biologic DMARD for increased disease activity  Chronic bilateral back pain, unspecified back location Degenerative disc disease Chronic back pain with limited range of motion.  Chronic hip pain likely related to IT band and gluteal muscle tightness. Pain management options limited due to kidney function and previous medication failures. - Refer to pain management if no disease specific treatment amenable  Chronic kidney disease, unspecified Chronic kidney disease with fluctuating function, likely related to hypertension. Oral NSAIDs contraindicated. Monitoring metabolic panel as above  Muscle weakness and jaw fatigue Chronic jaw fatigue and drooling, likely muscle-related. Symptoms improve with rest.  No associated ophthalmic or headache symptoms to suggest for temporal arteritis.  Suspect the corner of the lip drooling problem related to neuromuscular problem.  Senile purpura Intermittent bruising likely due to age-related skin fragility, sun exposure, and antiplatelet medication use.     Orders: Orders Placed This Encounter  Procedures   Sedimentation rate   C-reactive protein   CBC with Differential/Platelet   Comprehensive metabolic panel with GFR   QuantiFERON-TB Gold Plus   No orders of the defined types were placed in this  encounter.    Follow-Up Instructions: Return in about 3 months (around 09/29/2024) for RA on LEF f/u 3mos.   Lonni LELON Ester, MD  Note - This record has been created using [employer].  Chart creation errors have been sought, but may not always  have been located. Such creation errors do not reflect on  the standard of medical care.

## 2024-06-16 DIAGNOSIS — N1832 Chronic kidney disease, stage 3b: Secondary | ICD-10-CM | POA: Diagnosis not present

## 2024-06-23 DIAGNOSIS — N1832 Chronic kidney disease, stage 3b: Secondary | ICD-10-CM | POA: Diagnosis not present

## 2024-06-23 DIAGNOSIS — N401 Enlarged prostate with lower urinary tract symptoms: Secondary | ICD-10-CM | POA: Diagnosis not present

## 2024-06-23 DIAGNOSIS — M069 Rheumatoid arthritis, unspecified: Secondary | ICD-10-CM | POA: Diagnosis not present

## 2024-06-23 DIAGNOSIS — R0989 Other specified symptoms and signs involving the circulatory and respiratory systems: Secondary | ICD-10-CM | POA: Diagnosis not present

## 2024-06-23 NOTE — Progress Notes (Signed)
 [facility] Outpatient Return Visit      Assessment/Plan:   1. Stage 3b chronic kidney disease (HCC)  Renal Function Panel   POC Urinalysis Auto without Microscopic   Protein, Total, Random Urine    2. Labile hypertension      3. Rheumatoid arthritis, involving unspecified site, unspecified whether rheumatoid factor present    (CMD)      4. BPH with lower urinary tract symptoms without urinary obstruction        CKD stage IIIb -  baseline creatinine 1.4-1.8.  Labetalol  was switched to metoprolol  previously by cardiology and later beta-blocker discontinued due to bradycardia.  Last year he had pseudoaneurysm repair in his wrist that developed after cardiac catheterization in June.   -Creatinine is worse at 2.0 GFR 34 -Electrolytes are acceptable -Recent lightheadedness.  Denies NSAID use. -Mid-day dose of hydralazine  has been stopped by cardiology last month due to dizziness -Blood pressure mildly elevated.  Dropped to normal systolic blood pressure on standing but not orthostatic.  Monitor for now - Complain of dysuria.  Urine checked today and is bland.  Trace protein.  Will send for UPCR - Also recommended that if he is having significant lower urinary tract symptoms then he should follow back with urology    Return in about 4 months (around 10/24/2024).    No orders of the defined types were placed in this encounter.   Sameea Sadiq MD  [facility] 06/23/2024 10:44 AM    Interval History:    History of Present Illness:  Noah Grant returns for 24-month follow-up of CKD stage III He is accompanied by his wife today whom I am meeting for the first time  PMH of CKD, bipolar Disorder, rheumatoid arthritis, HTN, GERD. Baseline creatinine around 1.4-1.8  Creatinine higher at 2.0 GFR 34 this visit Electrolytes are acceptable Blood pressure is elevated today He continues on same regimen as last visit including amlodipine , Benicar  and hydralazine   He  saw cardiologist in June.  He was complaining of some chest pain.  CT PET scan of the chest was ordered which is not due until September.  Hydralazine  mid-day dose was decreased to half a tablet due to lightheadedness but later stopped.  Blood pressure was controlled at the visit Prior to that he had a recent ER visit with complaints of chest pain.  Creatinine was 1.8, bicarb 21 Denies any other recent illness or NSAID use  Does complain of nocturia.  Denies any daytime symptoms of urinary retention.  Notes foul smell to urine Used to follow with urology and is taking Proscar  but stopped following on his own Is on prednisone  as needed along with leflunomide  for his rheumatoid arthritis.  Has stopped taking prednisone  due to significant mood symptoms He continues on lithium  for bipolar disorder   Past work-up history His renal ultrasound showed normal-sized kidneys without any chronic changes.    Review of Systems A complete ROS was performed with pertinent positives/negatives noted in the HPI. The remainder of the ROS are negative.   Past History:   Active Ambulatory Problems    Diagnosis Date Noted  . Chronic inflammatory arthritis 03/23/2018  . Long-term use of immunosuppressant medication 03/23/2018  . Mixed hyperlipidemia 09/17/2016  . Vitamin D  deficiency 09/17/2016  . Bipolar depression (HCC) 09/17/2016  . Low testosterone 09/17/2016  . Vitamin B12 deficiency 09/17/2016  . Seasonal allergies 03/23/2018  . Lichen planus 04/07/2018  . Bipolar 2 disorder (HCC) 08/16/2014  . Disc degeneration, 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  .  Labile hypertension 08/16/2014  . Gastroesophageal reflux disease without esophagitis 08/16/2014  . Normochromic normocytic anemia 08/04/2018  . Rheumatoid arthritis (HCC) 10/29/2018  . BPH with lower urinary tract symptoms without urinary obstruction 12/29/2018  . PAD (peripheral artery disease) (HCC) 03/23/2019  . Barrett's esophagus without dysplasia 03/25/2019  .  History of colon polyps 03/25/2019  . Right-sided chest wall pain 04/17/2019  . Tremor 06/16/2019  . Thoracic radiculopathy 06/16/2019  . Stage 3b chronic kidney disease (HCC) 07/21/2019  . GAD (generalized anxiety disorder) 11/02/2018  . History of stroke 11/10/2019  . Brain ischemia 03/08/2020  . Cognitive complaints 03/08/2020  . Bipolar affective disorder, currently depressed, moderate (HCC) 03/08/2020  . Ingrown nail of great toe of right foot 03/12/2020  . Ingrown nail of great toe of left foot 03/12/2020  . Drug therapy 05/18/2021  . Hyperphosphatemia 10/30/2022  . Lithium  use 10/30/2022   Resolved Ambulatory Problems    Diagnosis Date Noted  . No Resolved Ambulatory Problems   Past Medical History:  Diagnosis Date  . Essential hypertension, benign   . Hearing loss   . Herpes gingivostomatitis   . High cholesterol   . Hypertension with goal to be determined   . Stomach acid    Family History  Problem Relation Name Age of Onset  . Anxiety disorder Father    . Hypertension Father    . Depression Father    . Depression Brother     Social History Social History   Tobacco Use  . Smoking status: Former    Current packs/day: 0.00    Average packs/day: 1 pack/day for 10.0 years (10.0 ttl pk-yrs)    Types: Cigarettes    Start date: 12/09/1961    Quit date: 12/10/1971    Years since quitting: 52.5  . Smokeless tobacco: Former  Substance Use Topics  . Alcohol use: No  . Drug use: No   Social History   Social History Narrative  . Not on file     Medications:   Meds Ordered in [facility]  Medication Sig Dispense Refill  . amLODIPine  (NORVASC ) 10 mg tablet Take 10 mg by mouth daily.    . aspirin  81 mg EC tablet Take 81 mg by mouth daily.    . cholecalciferol (Vitamin D3) 2,000 unit cap capsule Take 2,000 Units by mouth daily. (Patient taking differently: Take 3,000 Units by mouth daily.)    . coenzyme Q-10 200 mg capsule Take 200 mg by mouth Once Daily.    .  cyanocobalamin  (VITAMIN B12) 1,000 mcg tablet Take 1,000 mcg by mouth Once Daily.    . diclofenac  sodium (VOLTAREN ) 1 % gel Apply 1 Application topically as needed.    . ezetimibe  (ZETIA ) 10 mg tablet Take 10 mg by mouth Once Daily.    . finasteride  (PROSCAR ) 5 mg tablet Take 5 mg by mouth Once Daily.    . hydrALAZINE  (APRESOLINE ) 100 mg tablet TAKE 1 TABLET BY MOUTH 3 TIMES A DAY (Patient taking differently: Take 100 mg by mouth 2 (two) times a day.) 270 tablet 1  . leflunomide  (ARAVA ) 20 mg tablet Take 20 mg by mouth daily.    . levothyroxine  (SYNTHROID ) 25 mcg tablet Take 25 mcg by mouth every morning.    . lithium  150 mg capsule Take 450 mg by mouth every evening.    . lysine  1,000 mg tab Take 1,000 mg by mouth 2 (two) times a day. (Patient taking differently: Take 1,000 mg by mouth daily.)    .  nitroglycerin  (NITROSTAT ) 0.4 mg SL tablet Place 0.4 mg under the tongue.    . olmesartan  (BENICAR ) 40 mg tablet Take 40 mg by mouth daily.    . omeprazole  (PriLOSEC) 20 mg DR capsule Take 40 mg by mouth 2 (two) times a day.    . simvastatin  (ZOCOR ) 5 mg tablet Take 5 mg by mouth Once Daily.    . famotidine  (PEPCID ) 40 mg tablet Take 40 mg by mouth nightly. (Patient not taking: Reported on 06/23/2024)    . predniSONE  (DELTASONE ) 5 mg tablet Take 5 mg by mouth as needed. (Patient not taking: Reported on 06/23/2024)     No current Epic-ordered facility-administered medications on file.      Physical Exam:   Vitals:   06/23/24 1007 06/23/24 1027 06/23/24 1028  BP: 144/61 144/60 137/66  BP Location: Right arm Right arm Right arm  Patient Position: Sitting Sitting Standing  Pulse: 58 54 59  Weight: 74.8 kg (165 lb)    Height: 1.702 m (5' 7)        Wt Readings from Last 3 Encounters:  06/23/24 74.8 kg (165 lb)  02/19/24 76.2 kg (168 lb)  02/18/24 76.2 kg (168 lb)    General appearance: alert, appears stated age and cooperative,  Eyes: Sclera anicteric, no conjunctival pallor Nose, Mouth,  Throat: Normal oral mucosa,  Neck: No neck masses palpable, No lymphadenopathy Lungs: Auscultation: Clear to auscultation bilaterally Heart: Carotid bruit absent, regular rate and rhythm and S1, S2 normal, edema none bilaterally Abdomen: soft, non tender, no abdominal bruit Musculoskeletal: Gait slow, uses cane for support and ambulation  skin: Skin color, texture, turgor normal. No rashes or lesions Psych: Mood pleasant, Alert and oriented x 3  Results for orders placed or performed in visit on 06/23/24  POC Urinalysis Auto without Microscopic   Collection Time: 06/23/24 10:40 AM  Result Value Ref Range   Color, Urine Yellow Yellow   Clarity, Urine Clear Clear   Glucose, Urine Negative Negative mg/dL   Bilirubin, Urine Negative Negative   Ketones, Urine Negative Negative mg/dL   Specific Gravity, Urine 1.015 1.010, 1.015, 1.020, 1.025   Blood, Urine Negative Negative   pH, Urine 6.0 5.0, 5.5, 6.0, 6.5, 7.0, 7.5, 8.0   Protein, Urine Trace (A) Negative mg/dL   Urobilinogen, Urine 0.2 <2.0 mg/dL   Nitrite, Urine Negative Negative   Leukocyte Esterase, Urine Negative Negative   Kit/Device Lot # [id number]    Kit/Device Expiration Date 04/07/2025      Portions of this note were dictated using Dragon software.  It has been reviewed for accuracy, but may contain grammatical and clerical errors.

## 2024-06-29 ENCOUNTER — Ambulatory Visit: Attending: Internal Medicine | Admitting: Internal Medicine

## 2024-06-29 ENCOUNTER — Encounter: Payer: Self-pay | Admitting: Internal Medicine

## 2024-06-29 VITALS — BP 126/55 | HR 51 | Resp 14 | Ht 67.0 in | Wt 166.8 lb

## 2024-06-29 DIAGNOSIS — M069 Rheumatoid arthritis, unspecified: Secondary | ICD-10-CM | POA: Diagnosis not present

## 2024-06-29 DIAGNOSIS — Z79899 Other long term (current) drug therapy: Secondary | ICD-10-CM | POA: Diagnosis not present

## 2024-06-29 DIAGNOSIS — M549 Dorsalgia, unspecified: Secondary | ICD-10-CM | POA: Diagnosis not present

## 2024-06-29 DIAGNOSIS — Z7952 Long term (current) use of systemic steroids: Secondary | ICD-10-CM

## 2024-06-29 DIAGNOSIS — G8929 Other chronic pain: Secondary | ICD-10-CM | POA: Diagnosis not present

## 2024-07-02 LAB — CBC WITH DIFFERENTIAL/PLATELET
Absolute Lymphocytes: 410 {cells}/uL — ABNORMAL LOW (ref 850–3900)
Absolute Monocytes: 857 {cells}/uL (ref 200–950)
Basophils Absolute: 50 {cells}/uL (ref 0–200)
Basophils Relative: 0.8 %
Eosinophils Absolute: 265 {cells}/uL (ref 15–500)
Eosinophils Relative: 4.2 %
HCT: 30.9 % — ABNORMAL LOW (ref 38.5–50.0)
Hemoglobin: 9.9 g/dL — ABNORMAL LOW (ref 13.2–17.1)
MCH: 30.6 pg (ref 27.0–33.0)
MCHC: 32 g/dL (ref 32.0–36.0)
MCV: 95.4 fL (ref 80.0–100.0)
MPV: 12.2 fL (ref 7.5–12.5)
Monocytes Relative: 13.6 %
Neutro Abs: 4719 {cells}/uL (ref 1500–7800)
Neutrophils Relative %: 74.9 %
Platelets: 202 Thousand/uL (ref 140–400)
RBC: 3.24 Million/uL — ABNORMAL LOW (ref 4.20–5.80)
RDW: 11.8 % (ref 11.0–15.0)
Total Lymphocyte: 6.5 %
WBC: 6.3 Thousand/uL (ref 3.8–10.8)

## 2024-07-02 LAB — C-REACTIVE PROTEIN: CRP: <3 mg/L (ref <8.0)

## 2024-07-02 LAB — COMPREHENSIVE METABOLIC PANEL WITH GFR
AG Ratio: 2.4 (calc) (ref 1.0–2.5)
ALT: 15 U/L (ref 9–46)
AST: 15 U/L (ref 10–35)
Albumin: 4.5 g/dL (ref 3.6–5.1)
Alkaline phosphatase (APISO): 59 U/L (ref 35–144)
BUN/Creatinine Ratio: 19 (calc) (ref 6–22)
BUN: 34 mg/dL — ABNORMAL HIGH (ref 7–25)
CO2: 25 mmol/L (ref 20–32)
Calcium: 9.5 mg/dL (ref 8.6–10.3)
Chloride: 108 mmol/L (ref 98–110)
Creat: 1.79 mg/dL — ABNORMAL HIGH (ref 0.70–1.28)
Globulin: 1.9 g/dL (ref 1.9–3.7)
Glucose, Bld: 89 mg/dL (ref 65–99)
Potassium: 5.3 mmol/L (ref 3.5–5.3)
Sodium: 139 mmol/L (ref 135–146)
Total Bilirubin: 0.3 mg/dL (ref 0.2–1.2)
Total Protein: 6.4 g/dL (ref 6.1–8.1)
eGFR: 40 mL/min/1.73m2 — ABNORMAL LOW (ref >=60)

## 2024-07-02 LAB — QUANTIFERON-TB GOLD PLUS
Mitogen-NIL: 5.61 [IU]/mL
NIL: 0.01 [IU]/mL
QuantiFERON-TB Gold Plus: NEGATIVE
TB1-NIL: 0 [IU]/mL
TB2-NIL: 0 [IU]/mL

## 2024-07-02 LAB — SEDIMENTATION RATE: Sed Rate: 6 mm/h (ref 0–20)

## 2024-07-05 ENCOUNTER — Ambulatory Visit (INDEPENDENT_AMBULATORY_CARE_PROVIDER_SITE_OTHER): Admitting: Family Medicine

## 2024-07-05 ENCOUNTER — Encounter: Payer: Self-pay | Admitting: Family Medicine

## 2024-07-05 VITALS — BP 124/68 | HR 69 | Temp 98.0°F | Resp 16 | Ht 67.0 in | Wt 166.0 lb

## 2024-07-05 DIAGNOSIS — D649 Anemia, unspecified: Secondary | ICD-10-CM | POA: Diagnosis not present

## 2024-07-05 LAB — CBC WITH DIFFERENTIAL/PLATELET
Basophils Absolute: 0 K/uL (ref 0.0–0.1)
Basophils Relative: 0.8 % (ref 0.0–3.0)
Eosinophils Absolute: 0.2 K/uL (ref 0.0–0.7)
Eosinophils Relative: 3.8 % (ref 0.0–5.0)
HCT: 30.7 % — ABNORMAL LOW (ref 39.0–52.0)
Hemoglobin: 10 g/dL — ABNORMAL LOW (ref 13.0–17.0)
Lymphocytes Relative: 6.1 % — ABNORMAL LOW (ref 12.0–46.0)
Lymphs Abs: 0.4 K/uL — ABNORMAL LOW (ref 0.7–4.0)
MCHC: 32.7 g/dL (ref 30.0–36.0)
MCV: 92.6 fl (ref 78.0–100.0)
Monocytes Absolute: 0.8 K/uL (ref 0.1–1.0)
Monocytes Relative: 13.1 % — ABNORMAL HIGH (ref 3.0–12.0)
Neutro Abs: 4.7 K/uL (ref 1.4–7.7)
Neutrophils Relative %: 76.2 % (ref 43.0–77.0)
Platelets: 198 K/uL (ref 150.0–400.0)
RBC: 3.32 Mil/uL — ABNORMAL LOW (ref 4.22–5.81)
RDW: 13.1 % (ref 11.5–15.5)
WBC: 6.1 K/uL (ref 4.0–10.5)

## 2024-07-05 MED ORDER — HYDRALAZINE HCL 100 MG PO TABS: 100.0000 mg | ORAL_TABLET | Freq: Two times a day (BID) | ORAL | Status: AC

## 2024-07-05 NOTE — Patient Instructions (Signed)
Give us 2-3 business days to get the results of your labs back.   Keep the diet clean and stay active.  Stay hydrated.   Let us know if you need anything.  

## 2024-07-05 NOTE — Progress Notes (Signed)
 Chief Complaint  Patient presents with   Follow-up    Follow Up    Subjective: Patient is a 73 y.o. male here for f/u. Here w spouse.   Patient has a history of rheumatoid arthritis and follows with rheumatology team.  He had lab work done showing low hemoglobin.  He is here to follow-up for that.  He also has a history of CKD 3A, coronary artery disease, and stroke.  He denies any blood in the stool or urine.  No areas of easy bleeding.  He does take an aspirin  daily.  No anticoagulation.  He has chronic fatigue as well.  Past Medical History:  Diagnosis Date   Abdominal aortic atherosclerosis (HCC)    Anemia    likely of chronic disease   Angina pectoris (HCC) 08/30/2020   Arthritis    rheumatoid   Atrial fibrillation (HCC)    Atrial fibrillation with rapid ventricular response (HCC) 05/12/2022   06/20/23 - pt states he's not had any A-fib   Barrett's esophagus without dysplasia 03/25/2019   Formatting of this note might be different from the original. EGD done in 02/2019. Due in 3 years.   Benign prostatic hyperplasia with nocturia 12/29/2018   Bipolar 1 disorder (HCC)    Bipolar 1 disorder, mixed, moderate (HCC) 08/16/2014   Bipolar 2 disorder (HCC) 08/16/2014   Bipolar affective disorder, currently depressed, moderate (HCC) 03/08/2020   Bipolar depression (HCC) 08/16/2014   BPH with urinary obstruction 12/29/2018   Brain ischemia 03/08/2020   Cardiac murmur 08/30/2020   Chest pain 09/06/2020   Chicken pox    Chronic bronchitis (HCC)    Chronic kidney disease, stage 3a (HCC) 07/21/2019   Cognitive complaints 03/08/2020   Coronary artery disease involving native coronary artery of native heart 09/06/2020   Coronary artery disease involving native coronary artery of native heart with angina pectoris (HCC) 09/06/2020   Depression    Disc degeneration, 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   Drug therapy 05/18/2021   Elevated troponin level not due myocardial infarction 05/11/2022   Essential  hypertension    GAD (generalized anxiety disorder) 11/02/2018   Gastroesophageal reflux disease 08/16/2014   Formatting of this note might be different from the original. Last Assessment & Plan:  Well controlled. Continue current medications.   GERD without esophagitis 08/16/2014   Formatting of this note might be different from the original. Last Assessment & Plan:  Well controlled. Continue current medications.   Herpes gingivostomatitis 02/10/2015   Hiatal hernia    High risk medication use 03/23/2018   History of colon polyps 03/25/2019   Formatting of this note might be different from the original. tubular adenoma   History of stroke 11/10/2019   Hyperlipidemia    Hyperphosphatemia 10/30/2022   Hypertension    Ingrowing nail 03/12/2020   Intention tremor 06/16/2019   Labile hypertension 08/16/2014   Formatting of this note might be different from the original. Last Assessment & Plan:  Slightly above goal today secondary to pain. Will continue current regimen. Will check CMP today.   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 Concern over possible lichen planus. His dentist noticed lesions of his oral mucosa.  It was felt to be lichen planus.  He presents to discuss further.  Rarely does the area hurt.He smoked in the distant past EXAM shows several white patches on the buccal mucosa bilaterally.  No other concerning oral  mucosal les   Lithium  use 10/30/2022   Long-term use of immunosuppressant medication 03/23/2018   Low testosterone 09/17/2016   Midline thoracic back pain 02/10/2015   Mixed hyperlipidemia 08/16/2014   Myocarditis (HCC) 05/14/2022   Normochromic normocytic anemia 08/16/2014   Normocytic anemia    likely of chronic disease   PAD (peripheral artery disease) (HCC) 03/23/2019   Pain of right hip joint 01/19/2016   Pericarditis 05/12/2022   Raynaud's disease     Rheumatic fever    Rheumatoid arthritis (HCC) 10/29/2018   Formatting of this note might be different from the original. Rheum:  Dr. Ziolkowska Formerly on MTX - stopped due to mouth sores. Currently started on Arava .  Also on 5 mg prednisone  x 3 months during run-in.   Right-sided chest wall pain 04/17/2019   Seasonal allergies 03/23/2018   Sepsis (HCC) 05/11/2022   Stasis dermatitis of both legs 07/29/2022   Statin intolerance 07/31/2020   Stroke (HCC)    3 - TIA's   Task-specific dystonia of hand    right hand   Thoracic radiculopathy 06/16/2019   Vitamin B12 deficiency    Injections   Vitamin D  deficiency 09/17/2016    Objective: BP 124/68 (BP Location: Left Arm, Patient Position: Sitting)   Pulse 69   Temp 98 F (36.7 C) (Oral)   Resp 16   Ht 5' 7 (1.702 m)   Wt 166 lb (75.3 kg)   SpO2 98%   BMI 26.00 kg/m  General: Awake, appears stated age Heart: RRR Skin: No jaundice or obvious areas of easy bruising Mouth: MMM Lungs: CTAB, no rales, wheezes or rhonchi. No accessory muscle use Psych: Age appropriate judgment and insight, normal affect and mood  Assessment and Plan: Normocytic anemia - Plan: IBC + Ferritin, B12 and Folate Panel, CBC w/Diff  Check above labs.  Could be anemia of chronic disease.  Could consider referral to hematology for more reassurance. The patient and his wife voiced understanding and agreement to the plan.  Mabel Mt Lockwood, DO 07/05/24  11:34 AM

## 2024-07-06 ENCOUNTER — Ambulatory Visit: Payer: Self-pay | Admitting: Family Medicine

## 2024-07-06 LAB — B12 AND FOLATE PANEL
Folate: >23.4 ng/mL (ref >5.9)
Vitamin B-12: 169 pg/mL — ABNORMAL LOW (ref 211–911)

## 2024-07-06 LAB — IBC + FERRITIN
Ferritin: 38.9 ng/mL (ref 22.0–322.0)
Iron: 106 ug/dL (ref 42–165)
Saturation Ratios: 29.2 % (ref 20.0–50.0)
TIBC: 362.6 ug/dL (ref 250.0–450.0)
Transferrin: 259 mg/dL (ref 212.0–360.0)

## 2024-07-07 ENCOUNTER — Ambulatory Visit (INDEPENDENT_AMBULATORY_CARE_PROVIDER_SITE_OTHER)

## 2024-07-07 DIAGNOSIS — E538 Deficiency of other specified B group vitamins: Secondary | ICD-10-CM | POA: Diagnosis not present

## 2024-07-07 MED ORDER — CYANOCOBALAMIN 1000 MCG/ML IJ SOLN
1000.0000 ug | Freq: Once | INTRAMUSCULAR | Status: AC
  Administered 2024-07-07: 1000 ug via INTRAMUSCULAR

## 2024-07-07 NOTE — Progress Notes (Signed)
 Pt here for monthly B12 injection per [name]   Last B12 injection: n/a  Last B12 level:  07/05/24  B12 1000mcg given IM, and pt tolerated injection well.  Next B12 injection scheduled for: 07/15/24

## 2024-07-08 ENCOUNTER — Telehealth: Payer: Self-pay | Admitting: Family Medicine

## 2024-07-08 NOTE — Telephone Encounter (Signed)
 Copied from CRM [id number]. Topic: Medicare AWV >> Jul 08, 2024 11:54 AM Nathanel DEL wrote: Reason for CRM: Called LVM 07/08/2024 to schedule AWV. Please schedule Virtual or Telehealth visits ONLY.   Nathanel Paschal; Care Guide Ambulatory Clinical Support [place] [facility] Direct Dial: [phone]

## 2024-07-14 ENCOUNTER — Ambulatory Visit

## 2024-07-15 ENCOUNTER — Ambulatory Visit (INDEPENDENT_AMBULATORY_CARE_PROVIDER_SITE_OTHER)

## 2024-07-15 DIAGNOSIS — E538 Deficiency of other specified B group vitamins: Secondary | ICD-10-CM | POA: Diagnosis not present

## 2024-07-15 MED ORDER — CYANOCOBALAMIN 1000 MCG/ML IJ SOLN
1000.0000 ug | Freq: Once | INTRAMUSCULAR | Status: AC
  Administered 2024-07-15: 1000 ug via INTRAMUSCULAR

## 2024-07-15 NOTE — Progress Notes (Signed)
 Pt here for weekly B12 injection per [name]     Last B12 injection: 07/07/2024   Last B12 level:  07/05/24   B12 1000mcg given IM, and pt tolerated injection well.   Next B12 injection scheduled for:07/21/24

## 2024-07-16 ENCOUNTER — Other Ambulatory Visit: Payer: Self-pay | Admitting: Internal Medicine

## 2024-07-16 DIAGNOSIS — M069 Rheumatoid arthritis, unspecified: Secondary | ICD-10-CM

## 2024-07-16 NOTE — Telephone Encounter (Signed)
 Last Fill: 03/30/2024  Labs: 07/05/2024 RBC 3.32, Hemoglobin 10.0, HCT 30.7, Lymphocytes Relative 6.1, Monocytes Relative 13.1, Lymphs Abs 0.4, 06/29/2024 BUN 34 Creat 1.79,eGFR 40  Next Visit: 10/06/2024  Last Visit: 06/29/2024  DX: Rheumatoid arthritis involving multiple sites, unspecified whether rheumatoid factor present  Current Dose per office note 06/29/2024: leflunomide  20 mg daily.  Okay to refill Arava  ?

## 2024-07-19 ENCOUNTER — Ambulatory Visit: Payer: Self-pay | Admitting: Internal Medicine

## 2024-07-19 NOTE — Progress Notes (Signed)
 Sed rate and CRP remain normal.  Blood count is stable with anemia hemoglobin 9.9 about the same as before.  Kidney function is stable with estimated GFR of 40.

## 2024-07-21 ENCOUNTER — Ambulatory Visit

## 2024-07-21 DIAGNOSIS — E538 Deficiency of other specified B group vitamins: Secondary | ICD-10-CM

## 2024-07-21 MED ORDER — CYANOCOBALAMIN 1000 MCG/ML IJ SOLN
1000.0000 ug | Freq: Once | INTRAMUSCULAR | Status: AC
Start: 2024-07-21 — End: 2024-07-21
  Administered 2024-07-21 (×2): 1000 ug via INTRAMUSCULAR

## 2024-07-21 NOTE — Progress Notes (Signed)
             Pt here for weekly B12 injection per [name]     Last B12 injection: 07/15/2024   Last B12 level:  07/05/24   B12 1000mcg given IM, and pt tolerated injection well.   Next B12 injection scheduled for: next week

## 2024-07-22 ENCOUNTER — Ambulatory Visit

## 2024-07-28 ENCOUNTER — Ambulatory Visit (INDEPENDENT_AMBULATORY_CARE_PROVIDER_SITE_OTHER)

## 2024-07-28 DIAGNOSIS — E538 Deficiency of other specified B group vitamins: Secondary | ICD-10-CM

## 2024-07-28 MED ORDER — CYANOCOBALAMIN 1000 MCG/ML IJ SOLN
1000.0000 ug | Freq: Once | INTRAMUSCULAR | Status: AC
  Administered 2024-07-28: 1000 ug via INTRAMUSCULAR

## 2024-07-28 NOTE — Progress Notes (Signed)
 Pt here for weekly B12 injection per [name]   Last B12 injection: 07/21/2024  Last B12 level:   07/05/24  B12 1000mcg given IM, and pt tolerated injection well.  Next B12 injection scheduled for: 08/31/24

## 2024-08-02 ENCOUNTER — Other Ambulatory Visit: Payer: Self-pay | Admitting: Family Medicine

## 2024-08-02 DIAGNOSIS — K219 Gastro-esophageal reflux disease without esophagitis: Secondary | ICD-10-CM

## 2024-08-04 ENCOUNTER — Other Ambulatory Visit: Payer: Self-pay | Admitting: Family Medicine

## 2024-08-04 ENCOUNTER — Telehealth: Payer: Self-pay | Admitting: Orthopedic Surgery

## 2024-08-04 DIAGNOSIS — M542 Cervicalgia: Secondary | ICD-10-CM

## 2024-08-04 DIAGNOSIS — K219 Gastro-esophageal reflux disease without esophagitis: Secondary | ICD-10-CM

## 2024-08-04 NOTE — Telephone Encounter (Signed)
 Pt called asking for Dr Georgina to send a referral for pain management to [facility]. Pt phone number is [phone].

## 2024-08-11 ENCOUNTER — Other Ambulatory Visit (HOSPITAL_COMMUNITY)

## 2024-08-12 ENCOUNTER — Ambulatory Visit: Payer: Self-pay

## 2024-08-12 NOTE — Telephone Encounter (Signed)
 FYI Only or Action Required?: FYI only for provider.  Patient was last seen in primary care on 07/05/2024 by Frann Mabel Mt, DO.  Called Nurse Triage reporting Dizziness.  Symptoms began ongoing and worsening.  Interventions attempted: Rest, hydration, or home remedies.  Symptoms are: gradually worsening.  Triage Disposition: See PCP When Office is Open (Within 3 Days)  Patient/caregiver understands and will follow disposition?: No, refuses disposition    Copied from CRM [id number]. Topic: Clinical - Red Word Triage >> Aug 12, 2024  8:30 AM Robinson H wrote: [facility] that prompted transfer to Nurse Triage: Patients wife states patient is having some dizziness Reason for Disposition  [1] Fatigue (i.e., tires easily, decreased energy) AND [2] persists > 1 week  [1] MILD dizziness (e.g., walking normally) AND [2] has been evaluated by doctor (or NP/PA) for this  Answer Assessment - Initial Assessment Questions 1. DESCRIPTION: Describe your dizziness.     Felt like he was about to faint  2. LIGHTHEADED: Do you feel lightheaded? (e.g., somewhat faint, woozy, weak upon standing)     Lightheaded and dizziness 3. VERTIGO: Do you feel like either you or the room is spinning or tilting? (i.e., vertigo)     no 4. SEVERITY: How bad is it?  Do you feel like you are going to faint? Can you stand and walk?     moderate 5. ONSET:  When did the dizziness begin?     Ongoing and worsening 6. AGGRAVATING FACTORS: Does anything make it worse? (e.g., standing, change in head position)     no 7. HEART RATE: Can you tell me your heart rate? How many beats in 15 seconds?  (Note: Not all patients can do this.)       na 8. CAUSE: What do you think is causing the dizziness? (e.g., decreased fluids or food, diarrhea, emotional distress, heat exposure, new medicine, sudden standing, vomiting; unknown)     unknown 9. RECURRENT SYMPTOM: Have you had dizziness before? If Yes,  ask: When was the last time? What happened that time?     yes 10. OTHER SYMPTOMS: Do you have any other symptoms? (e.g., fever, chest pain, vomiting, diarrhea, bleeding)       Foggy in thinking 11. PREGNANCY: Is there any chance you are pregnant? When was your last menstrual period?       na  Slow with walking-shuffles  Answer Assessment - Initial Assessment Questions 1. DESCRIPTION: Describe how you are feeling.     Dizzy, feeling faint 2. SEVERITY: How bad is it?  Can you stand and walk?     moderate 3. ONSET: When did these symptoms begin? (e.g., hours, days, weeks, months)     ongoing 4. CAUSE: What do you think is causing the weakness or fatigue? (e.g., not drinking enough fluids, medical problem, trouble sleeping)     unknown 5. NEW MEDICINES:  Have you started on any new medicines recently? (e.g., opioid pain medicines, benzodiazepines, muscle relaxants, antidepressants, antihistamines, neuroleptics, beta blockers)     no 6. OTHER SYMPTOMS: Do you have any other symptoms? (e.g., chest pain, fever, cough, SOB, vomiting, diarrhea, bleeding, other areas of pain)     SOB with exertion 7. PREGNANCY: Is there any chance you are pregnant? When was your last menstrual period?     Na  Informed pt's wife that pt's PCP not available until Monday however could get him in with another provider at same location.  Wife & husband stated that's goodbye.  Protocols  used: Dizziness - Lightheadedness-A-AH, Weakness (Generalized) and Fatigue-A-AH

## 2024-08-26 DIAGNOSIS — M4800 Spinal stenosis, site unspecified: Secondary | ICD-10-CM | POA: Diagnosis not present

## 2024-08-26 DIAGNOSIS — Z8679 Personal history of other diseases of the circulatory system: Secondary | ICD-10-CM | POA: Diagnosis not present

## 2024-08-26 DIAGNOSIS — I4510 Unspecified right bundle-branch block: Secondary | ICD-10-CM | POA: Diagnosis not present

## 2024-08-26 DIAGNOSIS — I10 Essential (primary) hypertension: Secondary | ICD-10-CM | POA: Diagnosis not present

## 2024-08-26 DIAGNOSIS — Z955 Presence of coronary angioplasty implant and graft: Secondary | ICD-10-CM | POA: Diagnosis not present

## 2024-08-26 DIAGNOSIS — E782 Mixed hyperlipidemia: Secondary | ICD-10-CM | POA: Diagnosis not present

## 2024-08-26 DIAGNOSIS — Z1330 Encounter for screening examination for mental health and behavioral disorders, unspecified: Secondary | ICD-10-CM | POA: Diagnosis not present

## 2024-08-26 DIAGNOSIS — G250 Essential tremor: Secondary | ICD-10-CM | POA: Diagnosis not present

## 2024-08-26 DIAGNOSIS — I2510 Atherosclerotic heart disease of native coronary artery without angina pectoris: Secondary | ICD-10-CM | POA: Diagnosis not present

## 2024-08-31 ENCOUNTER — Ambulatory Visit (INDEPENDENT_AMBULATORY_CARE_PROVIDER_SITE_OTHER)

## 2024-08-31 DIAGNOSIS — E538 Deficiency of other specified B group vitamins: Secondary | ICD-10-CM

## 2024-08-31 MED ORDER — CYANOCOBALAMIN 1000 MCG/ML IJ SOLN
1000.0000 ug | Freq: Once | INTRAMUSCULAR | Status: AC
  Administered 2024-08-31: 1000 ug via INTRAMUSCULAR

## 2024-08-31 NOTE — Progress Notes (Signed)
 Patient is here for a vitamin B12 injection per orders from Dr. Frann:  Mabel Deward Frann, DO 07/06/2024 11:38 AM EDT     Noted. MyChart message sent to patient.   [place]- let's set him up for some B12 shots. 1 per week for 4 weeks, then every month. Let's recheck labs in 3 months. Thx.    Last injection: 07/28/2024. Denies gastrointestinal problems or dizziness.  B12 injection to L deltoid with no apparent complications.  Scheduled next injection in one month: pt has an OV with PCP on 09/22/2024.

## 2024-09-08 ENCOUNTER — Ambulatory Visit: Admitting: Psychiatry

## 2024-09-08 ENCOUNTER — Encounter: Payer: Self-pay | Admitting: Psychiatry

## 2024-09-08 DIAGNOSIS — F3132 Bipolar disorder, current episode depressed, moderate: Secondary | ICD-10-CM | POA: Diagnosis not present

## 2024-09-08 MED ORDER — LITHIUM CARBONATE 150 MG PO CAPS: 450.0000 mg | ORAL_CAPSULE | Freq: Every day | ORAL | 1 refills | Status: AC

## 2024-09-08 NOTE — Progress Notes (Signed)
 Noah Grant [id number] Mar 19, 1951 73 y.o.  Subjective:   Patient ID:  Noah Grant is a 73 y.o. (DOB June 15, 1951) male.  Chief Complaint:  Chief Complaint  Patient presents with   Follow-up   Depression   Anxiety    Noah Grant presents to the office today for follow-up of bipolar, GAD, poor STM.  seen May 18, 2019 and 11/17/2019 without med changes.    05/17/2020 appointment with the following noted: Word recall problems with neuropsych testing by Dr. Juliene Belts inconclusive results.  Test reviewed. Still tremor can interfere with eating. Nocturia Dt overactive bladder. Overall mentally ok but too many medical concerns with RA and muscle pain.   Chronic pain issues ongoing.   Htn controlled.  Plan: Lithium  level from January 2021 0.8 on 600 mg daily. Gradually it's crept up.  Disc in detail it's relation to renal function and Cr has increased gradually. BC tremor & jerks reduce to 450 mg daily.  02/12/2021 appointment with the following noted: Reduced ithium with no change in tremor. End of December, CVD stent placement and reaction to contrast dye.   Cardiologists ask if he can come off CBZ.   DT DDI. In the last year has felet mentally better than ever.   Plan: Reduce carbamazepine  by 1/2 tablet every week until off of it.  04/09/2021 appointment with the following noted: Off CBZ and didn't have any problems.  No worse for mood or anxiety.  Never slept good in life.  Melatonin caused dizziness. Checked lithium  level. 06  No change in tremors in a long time.  Maninly noticeable with fine motor control. Depression is somewhat chronic and waxes and wanes dependent on health issues.  Avoids stressors if possible.  Plan no med changes  10/10/21 appt noted: No Covid.  Hx TIA's.  Tremor seems a little worse but also has RA and started infusions last week. Continues lithium  450 mg HS as only psych med. Patient reports stable mood and denie irritable moods except as  noted.  Patient denies any recent difficulty with anxiety except situational.  Patient denies unusual difficulty with sleep initiation or maintenance except awakens with pain chronically. Denies appetite disturbance.  Patient reports that energy and motivation have been good.  Patient denies any difficulty with concentration  But history of word finding from ministrokes..  Patient denies any suicidal ideation.  04/09/22 appt noted: Doing well overall.  RA is a lot worse and changing rheumatologist.  Is tired and anemic with the meds. Mood overall is stable and pleased.  Deaf in left ear.  Tried hearing aids and overall minimal improvement.   Continues  lithium  450 mg HS as only psych med. Tremor is some worse. History TIA's or ministrokes may be contributing.  10/10/22 appt noted: Continues  lithium  450 mg HS as only psych med. 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 Wife survived breast CA. Decline in kidney function.  Pending FU in November. Mood has been ok.  Anxiety is manageable. Tremor is worse and worse as day progresses  03/04/23 appt noted: Continues  lithium  450 mg HS as only psych med. On Xarelto  but wants to get off it. Nephro notes Cr 1.4 up to 1.8 07/2022; last 01/29/23 =1.66.  Rec he disc alternative to lithium .   Patient reports stable mood and denies depressed or irritable moods.  Patient denies any recent difficulty with anxiety.  Patient denies difficulty with sleep initiation or maintenance. Denies appetite disturbance.  Patient reports that  energy and motivation have been good.  Patient denies any difficulty with concentration.  Patient denies any suicidal ideation. Card [place], [place] Plan: Continue lithium  450 mg daily but tremor did not get better with reduction last visit. Lithium  level 10/18/21 = 0.6, 05/12/22 lithium  =0.45 Disc alternative Trileptal but DDI   05/07/23 appt noted: Psych med:  lithium  450 mg HS as only psych med. Last Cr  better at 1.36. Disc pros and cons of changing lithium  and risk of relapse.   Mood is OK and without much change. Has RA and waxes and wanes and causes fatigue. Won't be given oral steroids.   Still has tremor but better than it was.    09/08/23 appt noted: Psych med:  lithium  CR 450 mg HS level 0.6 in June Cr is better .    Had heart Cath was ok with no new stents but has 2. May have small vessel heart dz.   Mood has been ok without major changes. Fatigue from either RA or heart. Sleep is not good mostly DT pain in hips causing awakening.   No SE with lithium .  03/08/24 appt noted: Psych med:  lithium  CR 450 mg HS Chronic htn but now controlled and it's too low bc causes fatigue.   RA causes fatigue.   Mood has been fine.   New  px Noah Grant.  Gets bad in mornings.  Turn blue and then ash white.  No mood swings.  Dep and anxiety managed.  Enjoys eating.  Can't walk DT RA.  Can't play golf.   SE tremor varies, worse when fatigue.  Otherwise no change.  No Caffeine. Sleep affected by OAB and hip pain.  But can go back to sleep.  09/08/24 appt noted:  Med:   lithium  450 mg HS Sleep disturbed by pain and OAB.  No OAB during the day.  Chronic hip pain.   Mood good.  Health continues to decline.   Tremor ongoing.  Interferes with eating some. s Disc potential OSA.     Has seen nephorologists and rheumatologists and neurologist, [facility].   Lives in [place] now  Previous psych med trials are extensive and include meds for anxiety and mood.  These include  clonidine, buspirone,  Abilify 5 mg,  olanzapine, Geodon, Depakote SE, CBZ,  lithium  gabapentin  sedation , lamotrigine, bupropion, Strattera,  Prozac, Serzone 700 mg daily, sertraline, duloxetine,  Pristiq which caused rage, mirtazapine, Pamelor, amitriptyline 450 mg/day, Ritalin, Provigil,  temazepam,  Ambien, ProSom, trazodone which was ineffective.  Cerefolin NAC,  Thinks he tried propranolol which caused back  pain  Review of Systems:  Review of Systems  Constitutional:  Positive for fatigue.  Cardiovascular:  Negative for chest pain and palpitations.  Gastrointestinal:  Positive for abdominal distention.  Musculoskeletal:  Positive for arthralgias and gait problem.  Neurological:  Positive for tremors. Negative for weakness.       Occ jerks  Psychiatric/Behavioral:  Negative for agitation, behavioral problems, confusion, decreased concentration, dysphoric mood, hallucinations, self-injury, sleep disturbance and suicidal ideas. The patient is nervous/anxious. The patient is not hyperactive.     Medications: I have reviewed the patient's current medications.  Current Outpatient Medications  Medication Sig Dispense Refill   amLODipine  (NORVASC ) 10 MG tablet Take 1 tablet (10 mg total) by mouth daily. 90 tablet 3   aspirin  EC 81 MG tablet Take 1 tablet (81 mg total) by mouth daily. Swallow whole. 30 tablet 12   Cholecalciferol 50 MCG (2000 UT) CAPS  Take 3,000 Units by mouth daily.     Coenzyme Q10 (COQ10) 200 MG CAPS Take 200 mg by mouth daily.     diclofenac  Sodium (VOLTAREN ) 1 % GEL Apply 1 application  topically daily as needed for pain.     ezetimibe  (ZETIA ) 10 MG tablet TAKE 1 TABLET BY MOUTH DAILY 90 tablet 3   finasteride  (PROSCAR ) 5 MG tablet Take 1 tablet (5 mg total) by mouth daily. 90 tablet 1   hydrALAZINE  (APRESOLINE ) 100 MG tablet Take 1 tablet (100 mg total) by mouth 2 (two) times daily.     leflunomide  (ARAVA ) 20 MG tablet TAKE 1 TABLET BY MOUTH DAILY 90 tablet 0   levothyroxine  (SYNTHROID ) 25 MCG tablet Take 1 tablet (25 mcg total) by mouth daily before breakfast. 90 tablet 1   Lysine  HCl 1000 MG TABS Take 1,000 mg by mouth 2 (two) times daily.  (Patient taking differently: Take 1,000 mg by mouth daily.)     nitroGLYCERIN  (NITROSTAT ) 0.4 MG SL tablet Place 1 tablet (0.4 mg total) under the tongue every 5 (five) minutes as needed for chest pain. 25 tablet 4   olmesartan  (BENICAR )  40 MG tablet TAKE 1 TABLET BY MOUTH DAILY 90 tablet 3   omeprazole  (PRILOSEC) 40 MG capsule Take 1 capsule (40 mg total) by mouth 2 (two) times daily before a meal. 180 capsule 1   simvastatin  (ZOCOR ) 5 MG tablet TAKE 1 TABLET BY MOUTH DAILY 90 tablet 3   lithium  carbonate 150 MG capsule Take 3 capsules (450 mg total) by mouth daily. 270 capsule 1   No current facility-administered medications for this visit.    Medication Side Effects: None  Allergies:  Allergies  Allergen Reactions   Methotrexate Dermatitis    Developed Mouth Sores   Nsaids Dermatitis    Developed Mouth Blisters   Plaquenil [Hydroxychloroquine] Other (See Comments)    Terrible Nightmares   Azulfidine [Sulfasalazine] Nausea Only   Crestor  [Rosuvastatin ] Other (See Comments)    Caused abdominal pain that radiated to the back   Iodinated Contrast Media     GI Intolerance, Made him severely sick   Isosorbide  Other (See Comments)    Made him feel weak   Mevacor  [Lovastatin ] Other (See Comments)    Caused abdominal pain that radiated to the back   Pravachol  [Pravastatin ] Other (See Comments)    Caused abdominal pain that radiated to the back   Ranexa  [Ranolazine  Er] Nausea Only    Severe nausea    Past Medical History:  Diagnosis Date   Abdominal aortic atherosclerosis    Anemia    likely of chronic disease   Angina pectoris 08/30/2020   Arthritis    rheumatoid   Atrial fibrillation (HCC)    Atrial fibrillation with rapid ventricular response (HCC) 05/12/2022   06/20/23 - pt states he's not had any A-fib   Barrett's esophagus without dysplasia 03/25/2019   Formatting of this note might be different from the original. EGD done in 02/2019. Due in 3 years.   Benign prostatic hyperplasia with nocturia 12/29/2018   Bipolar 1 disorder (HCC)    Bipolar 1 disorder, mixed, moderate (HCC) 08/16/2014   Bipolar 2 disorder (HCC) 08/16/2014   Bipolar affective disorder, currently depressed, moderate (HCC) 03/08/2020    Bipolar depression (HCC) 08/16/2014   BPH with urinary obstruction 12/29/2018   Brain ischemia 03/08/2020   Cardiac murmur 08/30/2020   Chest pain 09/06/2020   Chicken pox    Chronic bronchitis (HCC)  Chronic kidney disease, stage 3a (HCC) 07/21/2019   Cognitive complaints 03/08/2020   Coronary artery disease involving native coronary artery of native heart 09/06/2020   Coronary artery disease involving native coronary artery of native heart with angina pectoris 09/06/2020   Depression    Disc degeneration, 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 Last Assessment & Plan:  Well controlled. Continue current medications.   GERD without esophagitis 08/16/2014   Formatting of this note might be different from the original. Last Assessment & Plan:  Well controlled. Continue current medications.   Herpes gingivostomatitis 02/10/2015   Hiatal hernia    High risk medication use 03/23/2018   History of colon polyps 03/25/2019   Formatting of this note might be different from the original. tubular adenoma   History of stroke 11/10/2019   Hyperlipidemia    Hyperphosphatemia 10/30/2022   Hypertension    Ingrowing nail 03/12/2020   Intention tremor 06/16/2019   Labile hypertension 08/16/2014   Formatting of this note might be different from the original. Last Assessment & Plan:  Slightly above goal today secondary to pain. Will continue current regimen. Will check CMP today.   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 Concern over possible lichen planus. His dentist noticed lesions of his oral mucosa.  It was felt to be lichen planus.  He  presents to discuss further.  Rarely does the area hurt.He smoked in the distant past EXAM shows several white patches on the buccal mucosa bilaterally.  No other concerning oral mucosal les   Lithium  use 10/30/2022   Long-term use of immunosuppressant medication 03/23/2018   Low testosterone 09/17/2016   Midline thoracic back pain 02/10/2015   Mixed hyperlipidemia 08/16/2014   Myocarditis (HCC) 05/14/2022   Normochromic normocytic anemia 08/16/2014   Normocytic anemia    likely of chronic disease   PAD (peripheral artery disease) 03/23/2019   Pain of right hip joint 01/19/2016   Pericarditis 05/12/2022   Noah Grant disease    Rheumatic fever    Rheumatoid arthritis (HCC) 10/29/2018   Formatting of this note might be different from the original. Rheum:  Dr. Ziolkowska Formerly on MTX - stopped due to mouth sores. Currently started on Arava .  Also on 5 mg prednisone  x 3 months during run-in.   Right-sided chest wall pain 04/17/2019   Seasonal allergies 03/23/2018   Sepsis (HCC) 05/11/2022   Stasis dermatitis of both legs 07/29/2022   Statin intolerance 07/31/2020   Stroke (HCC)    3 - TIA's   Task-specific dystonia of hand    right hand   Thoracic radiculopathy 06/16/2019   Vitamin B12 deficiency    Injections   Vitamin D  deficiency 09/17/2016    Family History  Problem Relation Age of Onset   Alzheimer's disease Mother 55       Deceased in early 49s   Stroke Father 6       Deceased   Hypertension Father    Alcoholism Father    Alcoholism Brother    Heart disease Maternal Grandfather 75       Deceased   Heart disease Paternal Grandfather 64       Deceased  Hypertension Son      Past Medical History, Surgical history, Social history, and Family history were reviewed and updated as appropriate.   Please see review of systems for further details on the patient's review from today.   Objective:   Physical Exam:  There were no vitals taken for this  visit.  Physical Exam Constitutional:      General: He is not in acute distress.    Appearance: He is well-developed.  Musculoskeletal:        General: No deformity.  Neurological:     Mental Status: He is alert and oriented to person, place, and time.     Motor: Tremor present.     Coordination: Coordination normal.     Comments: Mild -moderate tremor  Psychiatric:        Attention and Perception: Attention normal. He is attentive.        Mood and Affect: Mood normal. Mood is not anxious or depressed. Affect is not labile or inappropriate.        Speech: Speech normal. Speech is not rapid and pressured.        Behavior: Behavior normal.        Thought Content: Thought content normal. Thought content is not delusional. Thought content does not include homicidal or suicidal ideation. Thought content does not include suicidal plan.        Cognition and Memory: Cognition normal.        Judgment: Judgment normal.     Comments: Insight is good. Good humor.     Lab Review:     Component Value Date/Time   NA 139 06/29/2024 1211   NA 142 06/03/2023 1019   K 5.3 06/29/2024 1211   CL 108 06/29/2024 1211   CO2 25 06/29/2024 1211   GLUCOSE 89 06/29/2024 1211   BUN 34 (H) 06/29/2024 1211   BUN 20 06/03/2023 1019   CREATININE 1.79 (H) 06/29/2024 1211   CALCIUM  9.5 06/29/2024 1211   PROT 6.4 06/29/2024 1211   PROT 5.9 (L) 11/28/2020 0841   ALBUMIN 4.3 05/26/2024 1309   ALBUMIN 4.2 11/28/2020 0841   AST 15 06/29/2024 1211   ALT 15 06/29/2024 1211   ALKPHOS 67 05/26/2024 1309   BILITOT 0.3 06/29/2024 1211   BILITOT 0.3 11/28/2020 0841   GFRNONAA 39 (L) 05/26/2024 1242   GFRAA 75 11/28/2020 0841       Component Value Date/Time   WBC 6.1 07/05/2024 1136   RBC 3.32 (L) 07/05/2024 1136   HGB 10.0 (L) 07/05/2024 1136   HGB 11.4 (L) 06/03/2023 1019   HCT 30.7 (L) 07/05/2024 1136   HCT 35.8 (L) 06/03/2023 1019   PLT 198.0 07/05/2024 1136   PLT 230 06/03/2023 1019   MCV 92.6  07/05/2024 1136   MCV 91 06/03/2023 1019   MCH 30.6 06/29/2024 1211   MCHC 32.7 07/05/2024 1136   RDW 13.1 07/05/2024 1136   RDW 13.8 06/03/2023 1019   LYMPHSABS 0.4 (L) 07/05/2024 1136   LYMPHSABS 0.5 (L) 08/30/2020 1139   MONOABS 0.8 07/05/2024 1136   EOSABS 0.2 07/05/2024 1136   EOSABS 0.3 08/30/2020 1139   BASOSABS 0.0 07/05/2024 1136   BASOSABS 0.1 08/30/2020 1139    Lithium  Lvl  Date Value Ref Range Status  03/10/2024 0.8 0.6 - 1.2 mmol/L Final     Latest Reference Range & Units 08/16/14 10:13 01/19/16 09:46 12/07/18 08:41 12/23/19 09:51 05/31/20 11:12 07/31/20 11:38 08/30/20 11:39 09/06/20 22:38 09/07/20 03:00 09/25/20 10:34 11/28/20 08:41 12/14/20  10:22 07/02/21 10:48 07/05/21 10:20 07/10/21 10:22 01/02/22 10:50 04/19/22 10:29 05/11/22 17:11 05/11/22 17:43 05/12/22 02:01 05/13/22 01:51 05/14/22 04:07 05/15/22 03:06 05/30/22 10:44 07/29/22 15:25 01/29/23 12:13 03/31/23 12:00 06/03/23 10:19 06/06/23 08:23 06/23/23 09:00 06/23/23 15:06 06/30/23 11:03  Creatinine 0.70 - 1.28 mg/dL 1.1 8.84 8.77 8.68 (H) 1.23 1.34 (H) 1.24 1.26 (H) 1.26 (H) 1.23 1.14 1.36 (H) 1.34 1.54 (H) 1.42 1.44 1.45 (H) 1.67 (H) 1.70 (H) 1.51 (H) 1.29 (H) 1.38 (H) 1.31 (H) 1.41 (H) 1.75 (H) 1.66 (H) 1.36 (H) 1.30 (H) 1.30 (H) 1.35 (H) 1.26 (H) 1.29 (H)  (H): Data is abnormally high  No results found for: PHENYTOIN, PHENOBARB, VALPROATE, CBMZ   .res Assessment: Plan:    Bipolar disorder with moderate depression (HCC) - Plan: lithium  carbonate 150 MG capsule, Lithium  level   He has failed multiple other psychiatric medications and does not wish to have further med changes today. Only current psych med is lithium  450 mg daily .  And his mood is good and stable.  He doesn't want to change bc good effect.   Lithium  level from January 2021 0.8 on 600 mg daily. Gradually it's crept up.  Disc in detail it's relation to renal function and Cr has increased gradually. CR 1.35 01/18/21  Creatinine 1.75 07/2022,  01/2023 CR 1.66, Cr 03/2023 = 1.36. 06/29/24 Cr 1.79 See latest update above.  Cr has gradually come down over time  Continue lithium  450 mg daily but tremor did not get better with reduction last visit. Lithium  level 10/18/21 = 0.6, 05/12/22 lithium  =0.45, 05/2023 = 0.6 03/10/24 lithium  0.8 Check lithium  level.  Check BMP every 3 mos with lithium  level  Counseled patient regarding potential benefits, risks, and side effects of lithium  to include potential risk of lithium  affecting thyroid  and renal function.  Discussed need for periodic lab monitoring to determine drug level and to assess for potential adverse effects.  Counseled patient regarding signs and symptoms of lithium  toxicity and advised that they notify office immediately or seek urgent medical attention if experiencing these signs and symptoms.  Patient advised to contact office with any questions or concerns. Kidney function is compromised with Cr 1.7 most recently but stable over last couple of year. He wants to continue lithium  for now.  Disc best alternative mood stabilizer Trileptal but still some Ddi issues but less than CBZ.  He'd rather not change. He's failed multiple others.  6 mos  Lorene Macintosh, MD, DFAPA   Please see After Visit Summary for patient specific instructions.  Future Appointments  Date Time Provider Department Center  09/22/2024 10:45 AM Frann Mabel Mt, DO [facility] [address] [facility]  09/30/2024 10:30 AM [facility] CLINICAL SUPPORT [facility] [address] [place]  10/06/2024 11:20 AM Jeannetta Lonni ORN, MD [facility] None  12/06/2024  1:30 PM Georgina Ozell LABOR, MD [facility] None    Orders Placed This Encounter  Procedures   Lithium  level       -------------------------------

## 2024-09-13 ENCOUNTER — Other Ambulatory Visit: Payer: Self-pay | Admitting: Family Medicine

## 2024-09-13 DIAGNOSIS — R351 Nocturia: Secondary | ICD-10-CM

## 2024-09-13 DIAGNOSIS — I10 Essential (primary) hypertension: Secondary | ICD-10-CM

## 2024-09-16 ENCOUNTER — Ambulatory Visit

## 2024-09-20 ENCOUNTER — Ambulatory Visit: Admitting: Cardiology

## 2024-09-22 ENCOUNTER — Other Ambulatory Visit (HOSPITAL_BASED_OUTPATIENT_CLINIC_OR_DEPARTMENT_OTHER): Payer: Self-pay

## 2024-09-22 ENCOUNTER — Encounter: Payer: Self-pay | Admitting: Family Medicine

## 2024-09-22 ENCOUNTER — Ambulatory Visit (INDEPENDENT_AMBULATORY_CARE_PROVIDER_SITE_OTHER): Admitting: Family Medicine

## 2024-09-22 ENCOUNTER — Ambulatory Visit: Payer: Self-pay | Admitting: Family Medicine

## 2024-09-22 VITALS — BP 126/76 | HR 57 | Temp 98.0°F | Resp 16 | Ht 67.0 in | Wt 167.4 lb

## 2024-09-22 DIAGNOSIS — H6123 Impacted cerumen, bilateral: Secondary | ICD-10-CM

## 2024-09-22 DIAGNOSIS — E538 Deficiency of other specified B group vitamins: Secondary | ICD-10-CM | POA: Diagnosis not present

## 2024-09-22 DIAGNOSIS — Z0000 Encounter for general adult medical examination without abnormal findings: Secondary | ICD-10-CM | POA: Diagnosis not present

## 2024-09-22 DIAGNOSIS — I25119 Atherosclerotic heart disease of native coronary artery with unspecified angina pectoris: Secondary | ICD-10-CM

## 2024-09-22 LAB — LIPID PANEL
Cholesterol: 122 mg/dL (ref 0–200)
HDL: 30.6 mg/dL — ABNORMAL LOW (ref >39.00)
LDL Cholesterol: 47 mg/dL (ref 0–99)
NonHDL: 90.9
Total CHOL/HDL Ratio: 4
Triglycerides: 218 mg/dL — ABNORMAL HIGH (ref 0.0–149.0)
VLDL: 43.6 mg/dL — ABNORMAL HIGH (ref 0.0–40.0)

## 2024-09-22 LAB — VITAMIN B12: Vitamin B-12: 447 pg/mL (ref 211–911)

## 2024-09-22 MED ORDER — COMIRNATY 30 MCG/0.3ML IM SUSY
0.3000 mL | PREFILLED_SYRINGE | Freq: Once | INTRAMUSCULAR | 0 refills | Status: AC
  Filled 2024-09-22: qty 0.3, 1d supply, fill #0

## 2024-09-22 NOTE — Patient Instructions (Signed)
Give us 2-3 business days to get the results of your labs back.   Keep the diet clean and stay active.  OK to use Debrox (peroxide) in the ear to loosen up wax. Also recommend using a bulb syringe (for removing boogers from baby's noses) to flush through warm water and vinegar (3-4:1 ratio). An alternative, though more expensive, is an elephant ear washer wax removal kit. Do not use Q-tips as this can impact wax further.   Let us know if you need anything. 

## 2024-09-22 NOTE — Progress Notes (Signed)
 Chief Complaint  Patient presents with   Annual Exam    CPE    Well Male Noah Grant is here for a complete physical.   His last physical was >1 year ago.  Current diet: in general, diet is OK.   Current exercise: some walking Weight trend: stable Fatigue out of ordinary? Yes.  Seat belt? Yes.   Advanced directive? Yes  Health maintenance Shingrix- Yes Colonoscopy- Yes Tetanus- Yes Hep C- Yes Lung cancer screening- Yes Pneumonia vaccine- Yes AAA screening- Yes  Ear fullness Going on for 3 mo in both ears. No drainage or pain. Decreaesd hearing. Hx of wax buildup.  Past Medical History:  Diagnosis Date   Abdominal aortic atherosclerosis    Anemia    likely of chronic disease   Angina pectoris 08/30/2020   Arthritis    rheumatoid   Atrial fibrillation (HCC)    Atrial fibrillation with rapid ventricular response (HCC) 05/12/2022   06/20/23 - pt states he's not had any A-fib   Barrett's esophagus without dysplasia 03/25/2019   Formatting of this note might be different from the original. EGD done in 02/2019. Due in 3 years.   Benign prostatic hyperplasia with nocturia 12/29/2018   Bipolar 1 disorder (HCC)    Bipolar 1 disorder, mixed, moderate (HCC) 08/16/2014   Bipolar 2 disorder (HCC) 08/16/2014   Bipolar affective disorder, currently depressed, moderate (HCC) 03/08/2020   Bipolar depression (HCC) 08/16/2014   BPH with urinary obstruction 12/29/2018   Brain ischemia 03/08/2020   Cardiac murmur 08/30/2020   Chest pain 09/06/2020   Chicken pox    Chronic bronchitis (HCC)    Chronic kidney disease, stage 3a (HCC) 07/21/2019   Cognitive complaints 03/08/2020   Coronary artery disease involving native coronary artery of native heart 09/06/2020   Coronary artery disease involving native coronary artery of native heart with angina pectoris 09/06/2020   Depression    Disc degeneration, 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   Drug therapy 05/18/2021   Elevated troponin level not due  myocardial infarction 05/11/2022   Essential hypertension    GAD (generalized anxiety disorder) 11/02/2018   Gastroesophageal reflux disease 08/16/2014   Formatting of this note might be different from the original. Last Assessment & Plan:  Well controlled. Continue current medications.   GERD without esophagitis 08/16/2014   Formatting of this note might be different from the original. Last Assessment & Plan:  Well controlled. Continue current medications.   Herpes gingivostomatitis 02/10/2015   Hiatal hernia    High risk medication use 03/23/2018   History of colon polyps 03/25/2019   Formatting of this note might be different from the original. tubular adenoma   History of stroke 11/10/2019   Hyperlipidemia    Hyperphosphatemia 10/30/2022   Hypertension    Ingrowing nail 03/12/2020   Intention tremor 06/16/2019   Labile hypertension 08/16/2014   Formatting of this note might be different from the original. Last Assessment & Plan:  Slightly above goal today secondary to pain. Will continue current regimen. Will check CMP today.   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 Concern over possible lichen planus. His dentist noticed lesions of his oral mucosa.  It was felt to be lichen planus.  He presents to discuss further.  Rarely does the area hurt.He smoked in the distant past EXAM shows several white patches on the buccal mucosa bilaterally.  No other concerning oral  mucosal les   Lithium  use 10/30/2022   Long-term use of immunosuppressant medication 03/23/2018   Low testosterone 09/17/2016   Midline thoracic back pain 02/10/2015   Mixed hyperlipidemia 08/16/2014   Myocarditis (HCC) 05/14/2022   Normochromic normocytic anemia 08/16/2014   Normocytic anemia    likely of chronic disease   PAD (peripheral artery disease) 03/23/2019   Pain of right hip joint 01/19/2016   Pericarditis  05/12/2022   Raynaud's disease    Rheumatic fever    Rheumatoid arthritis (HCC) 10/29/2018   Formatting of this note might be different from the original. Rheum:  Dr. Ziolkowska Formerly on MTX - stopped due to mouth sores. Currently started on Arava .  Also on 5 mg prednisone  x 3 months during run-in.   Right-sided chest wall pain 04/17/2019   Seasonal allergies 03/23/2018   Sepsis (HCC) 05/11/2022   Stasis dermatitis of both legs 07/29/2022   Statin intolerance 07/31/2020   Stroke (HCC)    3 - TIA's   Task-specific dystonia of hand    right hand   Thoracic radiculopathy 06/16/2019   Vitamin B12 deficiency    Injections   Vitamin D  deficiency 09/17/2016     Past Surgical History:  Procedure Laterality Date   CLAVICLE SURGERY     Right, Hardware placed   CORONARY STENT INTERVENTION N/A 09/06/2020   Procedure: CORONARY STENT INTERVENTION;  Surgeon: Wonda Sharper, MD;  Location: [facility] INVASIVE CV LAB;  Service: Cardiovascular;  Laterality: N/A;   FALSE ANEURYSM REPAIR Right 06/23/2023   Procedure: RIGHT ULNAR ARTERY PSEUDOANEURYSM REPAIR;  Surgeon: Lanis Fonda BRAVO, MD;  Location: [facility] OR;  Service: Vascular;  Laterality: Right;   ingrown toenail Bilateral    great toes   LEFT HEART CATH AND CORONARY ANGIOGRAPHY N/A 09/06/2020   Procedure: LEFT HEART CATH AND CORONARY ANGIOGRAPHY;  Surgeon: Wonda Sharper, MD;  Location: [facility] INVASIVE CV LAB;  Service: Cardiovascular;  Laterality: N/A;   LEFT HEART CATH AND CORONARY ANGIOGRAPHY N/A 06/06/2023   Procedure: LEFT HEART CATH AND CORONARY ANGIOGRAPHY;  Surgeon: Anner Alm ORN, MD;  Location: [facility] INVASIVE CV LAB;  Service: Cardiovascular;  Laterality: N/A;   WISDOM TOOTH EXTRACTION      Medications  Current Outpatient Medications on File Prior to Visit  Medication Sig Dispense Refill   amLODipine  (NORVASC ) 10 MG tablet TAKE 1 TABLET BY MOUTH DAILY 90 tablet 3   aspirin  EC 81 MG tablet Take 1 tablet (81 mg total) by mouth daily. Swallow  whole. 30 tablet 12   Cholecalciferol 50 MCG (2000 UT) CAPS Take 3,000 Units by mouth daily.     Coenzyme Q10 (COQ10) 200 MG CAPS Take 200 mg by mouth daily.     diclofenac  Sodium (VOLTAREN ) 1 % GEL Apply 1 application  topically daily as needed for pain.     ezetimibe  (ZETIA ) 10 MG tablet TAKE 1 TABLET BY MOUTH DAILY 90 tablet 3   finasteride  (PROSCAR ) 5 MG tablet TAKE 1 TABLET BY MOUTH DAILY 90 tablet 1   hydrALAZINE  (APRESOLINE ) 100 MG tablet Take 1 tablet (100 mg total) by mouth 2 (two) times daily.     leflunomide  (ARAVA ) 20 MG tablet TAKE 1 TABLET BY MOUTH DAILY 90 tablet 0   levothyroxine  (SYNTHROID ) 25 MCG tablet Take 1 tablet (25 mcg total) by mouth daily before breakfast. 90 tablet 1   lithium  carbonate 150 MG capsule Take 3 capsules (450 mg total) by mouth daily. 270 capsule 1   Lysine  HCl 1000 MG TABS Take 1,000  mg by mouth 2 (two) times daily.  (Patient taking differently: Take 1,000 mg by mouth daily.)     nitroGLYCERIN  (NITROSTAT ) 0.4 MG SL tablet Place 1 tablet (0.4 mg total) under the tongue every 5 (five) minutes as needed for chest pain. 25 tablet 4   olmesartan  (BENICAR ) 40 MG tablet TAKE 1 TABLET BY MOUTH DAILY 90 tablet 3   omeprazole  (PRILOSEC) 40 MG capsule Take 1 capsule (40 mg total) by mouth 2 (two) times daily before a meal. 180 capsule 1   simvastatin  (ZOCOR ) 5 MG tablet TAKE 1 TABLET BY MOUTH DAILY 90 tablet 3    Allergies Allergies  Allergen Reactions   Methotrexate Dermatitis    Developed Mouth Sores   Nsaids Dermatitis    Developed Mouth Blisters   Plaquenil [Hydroxychloroquine] Other (See Comments)    Terrible Nightmares   Azulfidine [Sulfasalazine] Nausea Only   Crestor  [Rosuvastatin ] Other (See Comments)    Caused abdominal pain that radiated to the back   Iodinated Contrast Media     GI Intolerance, Made him severely sick   Isosorbide  Other (See Comments)    Made him feel weak   Mevacor  [Lovastatin ] Other (See Comments)    Caused abdominal pain  that radiated to the back   Pravachol  [Pravastatin ] Other (See Comments)    Caused abdominal pain that radiated to the back   Ranexa  [Ranolazine  Er] Nausea Only    Severe nausea    Family History Family History  Problem Relation Age of Onset   Alzheimer's disease Mother 21       Deceased in early 70s   Stroke Father 76       Deceased   Hypertension Father    Alcoholism Father    Alcoholism Brother    Heart disease Maternal Grandfather 14       Deceased   Heart disease Paternal Grandfather 22       Deceased   Hypertension Son     Review of Systems: Constitutional:  no fevers Eye:  no recent significant change in vision Ears: + Ear fullness and decreased hearing Nose/Mouth/Throat:  no complaints of nasal congestion, no sore throat Cardiovascular: no chest pain Respiratory:  No shortness of breath Gastrointestinal:  No change in bowel habits GU:  No frequency Integumentary:  no abnormal skin lesions reported Neurologic:  no headaches Endocrine:  denies unexplained weight changes  Exam BP 126/76 (BP Location: Left Arm, Patient Position: Sitting)   Pulse (!) 57   Temp 98 F (36.7 C) (Oral)   Resp 16   Ht 5' 7 (1.702 m)   Wt 167 lb 6.4 oz (75.9 kg)   SpO2 96%   BMI 26.22 kg/m  General:  well developed, well nourished, in no apparent distress Skin:  no significant moles, warts, or growths Head:  no masses, lesions, or tenderness Eyes:  pupils equal and round, sclera anicteric without injection Ears: 100% obstructed w cerumen b/l Nose:  nares patent, mucosa normal Throat/Pharynx:  lips and gingiva without lesion; tongue and uvula midline; non-inflamed pharynx; no exudates or postnasal drainage Lungs:  clear to auscultation, breath sounds equal bilaterally, no respiratory distress Cardio:  regular rhythm, bradycardic, 1+ pitting b/l LE edema tapering mid tibia Rectal: Deferred GI: BS+, S, NT, ND, no masses or organomegaly Musculoskeletal:  symmetrical muscle groups  noted without atrophy or deformity Neuro:  gait normal; deep tendon reflexes normal and symmetric Psych: well oriented with normal range of affect and appropriate judgment/insight  Assessment and Plan  Well adult exam  Coronary artery disease involving native coronary artery of native heart with angina pectoris - Plan: Lipid panel  B12 deficiency - Plan: B12  Bilateral impacted cerumen   Well 73 y.o. male. Counseled on diet and exercise. Cerumen: Flush today. Home care instructions written down. Debrox prn.  Other orders as above. Follow up in 6 mo.  The patient voiced understanding and agreement to the plan.  Mabel [place], DO 09/22/24 11:20 AM

## 2024-09-23 ENCOUNTER — Other Ambulatory Visit: Payer: Self-pay | Admitting: Family Medicine

## 2024-09-23 DIAGNOSIS — M47816 Spondylosis without myelopathy or radiculopathy, lumbar region: Secondary | ICD-10-CM | POA: Diagnosis not present

## 2024-09-23 MED ORDER — FENOFIBRATE 48 MG PO TABS: 48.0000 mg | ORAL_TABLET | Freq: Every day | ORAL | 3 refills | Status: AC

## 2024-09-23 NOTE — Telephone Encounter (Signed)
Called pt - lab appt scheduled.

## 2024-09-23 NOTE — Progress Notes (Signed)
 Office Visit Note  Patient: Noah Grant             Date of Birth: 1951-03-28           MRN: [id number]             PCP: Frann Mabel Mt, DO Referring: Frann Mabel Mt* Visit Date: 10/06/2024   Subjective:   Discussed the use of AI scribe software for clinical note transcription with the patient, who gave verbal consent to proceed.  History of Present Illness   Noah Grant is a 73 y.o. 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 daily. Noah Grant He is accompanied by his wife.  He experiences chronic back pain, particularly in the cervical region, and has been under the care of an orthopedist who suggested surgery. He is exploring alternative pain management options, including injections and medication. Due to kidney issues, he cannot take NSAIDs and is currently using diclofenac  topically, which provides some relief. He has tried various topical treatments, including lidocaine  patches and Icy Hot, but finds Voltaren  to be the most effective.  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 An MRI of the heart is scheduled for December 5th to assess for scar tissue. He has a history of high blood pressure, which is now controlled with three different medications, though he notes a recent drop in diastolic pressure to the low fifties. He experiences balance issues, often falling against walls, but does not describe it as dizziness. No neuropathy symptoms during the day, but they occur at night. He previously tried gabapentin  but found it caused excessive drowsiness.  He reports having tremors and has been diagnosed with Raynaud's phenomenon, experiencing blue discoloration in his fingers. His hands feel 'useless' and he frequently drops things. He experiences some swelling in the ankles.       Previous HPI 06/29/2024 Noah Grant is a 73 y.o. 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 daily.   He experiences significant stiffness and loss of function in his hands, stating he is 'losing the use of my hands' and cannot pick up pills or hold objects effectively. Persistent pain and stiffness are also present in his shoulders and hips, described as 'hurt like the devil.' His feet are painful, and he is unsure of the cause. He has tried various arthritis medications in the past, without significant relief. He is currently taking leflunomide  20 mg and uses Voltaren  gel on his back and hips and a lidocaine  patch on his lower back. He discontinued prednisone  due to side effects especially worsening his mood with irritability.   He has been experiencing jaw fatigue and drooling for about a year, with symptoms gradually worsening. His jaw gets tired and painful when chewing, requiring frequent pauses. The drooling occurs at the corners of his mouth. No visual changes such as double vision or blurriness, and no headaches.    He reports a decline in kidney function over the past year, with fluctuating levels. He mentions a history of high blood pressure as a potential cause, but recent urine tests were normal.   He is on blood-thinning medication and has a history of bruising easily, experiencing intermittent bruising, particularly on his right hand, which he attributes to possible trauma.   He is scheduled for a PET scan on September 3rd. He has undergone previous cardiac evaluations,  including stents and catheterization.   He experiences stiffness in his legs and difficulty with mobility, noting that his legs are 'so stiff.' He attempts to do stretching exercises but finds them challenging. NSome swelling in his hands but not significantly in his legs. No recent changes in a rash on his legs.         Previous HPI 03/30/2024 Noah Grant is a 73 y.o. 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 daily and  prednisone  5 mg daily as needed.     He has ongoing issues with rheumatoid arthritis, primarily affecting his shoulders, hips, and hands. He experiences significant difficulty with fine motor tasks, such as picking up and holding objects, and frequently drops items. Loading his pill box weekly is particularly challenging. He continues to take leflunomide  and uses steroids as needed, though prednisone  makes him irritable.   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 The pain is described as aching and is associated with stiffness and soreness, particularly after sitting for extended periods. He finds it difficult to rise from furniture and benefits from a chair-height toilet. He has limited shoulder movement and experiences soreness in the shoulder joint itself, though the pain does not radiate down the arm.   He has developed Raynaud's phenomenon, jokingly referring to himself as a 'smurf.' He experiences significant fatigue and lightheadedness, particularly when walking uphill or on inclines, though he manages downhill walking better. He underwent vascular studies to rule out peripheral artery disease, which were negative. He has seen a cardiologist and is scheduled for a follow-up in June.   No recent illnesses such as upper respiratory infections or gastrointestinal issues. He has not experienced any stomach or gut problems with leflunomide . Muscle relaxers have been used in the past but provide limited relief and cause drowsiness, so he only takes them at night. Joint injections have been tried previously with minimal benefit.   Previous HPI 12/31/2023 Noah Grant is a 73 y.o. 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 daily and prednisone  5 mg daily as needed.  He presents with worsening pain and discomfort in multiple areas. They report severe foot pain, particularly at night, and increasing  hip pain that radiates into the leg muscles. The patient also notes worsening 'duck feet,' with their feet turning outward more than before.   The patient's hands have been turning blue several times a day, primarily under the fingernails, and the tips of their fingers are numb and tingly. They report a loss of hand function, with difficulty picking up and holding items. Their primary care provider suggested this might be Raynaud's disease.   The patient also reports persistent shoulder pain. Despite these issues, the patient states they feel really good overall, except for the aforementioned symptoms.   The patient's medications include amlodipine , olmesartan , and hydralazine  for their cardiovascular condition. They also take leflunomide  and prednisone  for rheumatoid arthritis, and occasionally use over-the-counter Tylenol  for pain.   The patient has a history of atrial fibrillation but has not had any clots or recurrence of the condition since their hospitalization for myocarditis. They were previously on Eliquis  and Plavix , but are currently on low-dose aspirin .   The patient also has a hiatal hernia and Barrett's disease, and has been struggling with severe acid reflux despite taking omeprazole  twice daily. They are due to start on  famotidine  in addition to the omeprazole .   The patient has not been on any muscle relaxants or neuropathy medications recently, but has previously taken gabapentin , which they stopped due to sedation. They also report a reaction to isosorbide , which caused lethargy and joint pain.   The patient's blood pressure has stabilized with their current medication regimen. They have not been sick recently, but had a bladder infection treated with antibiotics about five to six months ago. They are due to see a gastroenterologist next month for their hiatal hernia and Barrett's disease.        Previous HPI 09/30/2023 Noah Grant is a 73 y.o. male here for follow up for  seropositive RA on leflunomide  20 mg p.o. daily and prednisone  5 mg daily as needed.  Continues to experience daily joint pain and stiffness worst complaints in his hands and hips.  He feels there is an increase in hand swelling since the last visit.  Addition of 5 mg prednisone  he does not feel made a very big difference and is only taking occasionally.  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  In his left hand experiences pain near the base of the third finger and frequently has pain and getting stuck first thing in the morning. Besides arthritis he complains of generalized fatigue and he has to pause after short periods of exertion although he does not feel any specific change in breathing.  He still having frequent chest pain taking oral nitrates up to twice daily.   Previous HPI 06/30/2023 Noah Grant is a 73 y.o. 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 daily since our last visit still has a lot of joint pain limiting his activities uses a cane for support which decreases leg pain.  Still has a lot of shoulder pain especially with trying to reach overhead or behind his back and bilateral hand pain especially in the MCPs.  No specific flareup or exacerbation of swelling.  He did have heart catheterization through right ulnar artery that was complicated by pseudoaneurysm had subsequent surgical repair for this.  Lateral shoulder and hip pain bothers him especially if he lies to his side at night.     Previous HPI 03/31/2023 Noah Grant is a 73 y.o. 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 daily we switch medications after last visit.  So far has not seen any significant difference in joint symptoms for better or worse.  He is tolerating the medication without noticeable side effect.  He had follow-up with his PCP office and discussed bilateral foot pain that was apparently unclear if related to  peripheral neuropathy or from his rheumatoid arthritis.  Pain is mostly in the anterior half of the foot more severe along the top and across MTPs particularly bad early in the day when weightbearing or with direct pressure.  His mobility is also limited with bilateral hip pain.  Hurts worse on the side of the hips gets much worse after prolonged walking also gets pain if lying on either side in bed from direct pressure.   Previous HPI 01/29/23 Noah Grant is a 73 y.o. male here for follow up for seropositive RA on Actemra  162 mg subcu q. 14 days.  Overall he is feels very poorly like his arthritis is not benefiting at all from the treatment and mobility is worse than a few months ago.  He stopped some cardiac  medications with improvement in his dizziness and leg swelling and fatigue.  Recent 2D echo look normal with no evidence of effusion or reduced ejection fraction from previous myocarditis episode.  He has ongoing bilateral shoulder pain with stiffness that remains throughout the entire day.  He is using a cane for offloading due to pain affecting hips and feet on both sides worse while walking.  He is seeing some diffuse swelling throughout the hands this appears worse at the end of the day does not localize to any particular joints.  Does cause difficulty fully closing his grip.   Previous HPI 10/29/22 Noah Grant is a 73 y.o. 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  He completed the colchicine  and is now on just the Actemra  treatment.  He is feeling very fatigued limiting his overall activity level.  He is also been experiencing some additional pain and stiffness in multiple areas.  He had increased swelling in both legs accumulating towards the feet and ankles.  He has had adjustment to beta blocker treatment currently prescribed metoprolol  XR 25 mg daily. Bradycardia and heart rate improved now often in the 50s did not notice any  difference in the peripheral swelling after the medicine change. He is also still on norvasc  and hydralazine  for blood pressure control.   Previous HPI 05/30/22 Noah Grant is a 73 y.o. male here for follow up for RA he was on leflunomide  recently hospitalized with sepsis and pericarditis without clear underlying infection identified.  Symptoms started with chills developed increasing swelling in his hands and feet with joint pain that was worse in the shoulders and hips.  He developed severe chest pain felt a constriction or tightness around the heart.  The chest pain was sharp did report provocation with deep breaths or lying in left or right positions.  Evaluation was consistent with pericarditis with effusion.  He was discharged on colchicine  and received short term steroids and the chest pain is currently doing well.  The flareup of joint pain has also improved with the oral anti-inflammatory medication.   Previous HPI 04/19/22 Noah Grant is a 73 y.o. male here for rheumatoid arthritis previously seeing Dr. Ziolkowska and on treatment with leflunomide .  Symptoms started a long time ago gradual onset but has been seeing trucks for treatment of this in the past decade or so.  He has joint pain and stiffness involving multiple areas.  Shoulders, wrists, hands, also bilateral hip pains.  Most commonly sees visible swelling or inflammation in his hands.  He has morning stiffness very severe for 30 minutes to an hour but keeps persistent joint stiffness throughout the day.  Also has pain increased when trying to lie still at night.  He has tried a number of treatments for this over multiple years.  He did not tolerate hydroxychloroquine due to confusion, MTX not tolerated, leflunomide  leukopenia, and sulfasalazine mood disturbances.  He never experienced a significant improvement with Humira injection and developed acute conjunctivitis shortly after starting Remicade  infusion.   DMARD Hx LEF  current MTX GI intolerance HCQ Psychiatric side effects SSZ Psychiatric side effects Humira nonresponder Remicade  nonresponder and conjunctivitis   12/2021 Cholesterol 107 TGs 193 HDL 28.9   09/2021 HBV neg HCV neg   Review of Systems  Constitutional:  Positive for fatigue.  HENT:  Negative for mouth sores and mouth dryness.   Eyes:  Negative for dryness.  Respiratory:  Positive for  shortness of breath.   Cardiovascular:  Positive for chest pain. Negative for palpitations.  Gastrointestinal:  Positive for diarrhea. Negative for blood in stool and constipation.  Endocrine: Negative for increased urination.  Genitourinary:  Negative for involuntary urination.  Musculoskeletal:  Positive for joint pain, gait problem, joint pain, joint swelling, myalgias, muscle weakness, morning stiffness, muscle tenderness and myalgias.  Skin:  Negative for color change, rash, hair loss and sensitivity to sunlight.  Allergic/Immunologic: Negative for susceptible to infections.  Neurological:  Negative for dizziness and headaches.  Hematological:  Negative for swollen glands.  Psychiatric/Behavioral:  Positive for sleep disturbance. Negative for depressed mood. The patient is not nervous/anxious.     PMFS History:  Patient Active Problem List   Diagnosis Date Noted   Gait instability 10/06/2024   Dehydration 06/03/2024   Chronic fatigue 12/21/2023   Trigger finger, left middle finger 09/30/2023   Pseudoaneurysm 06/23/2023   Hyperphosphatemia 10/30/2022   Lithium  use 10/30/2022   Anemia 09/02/2022   Atrial fibrillation (HCC) 09/02/2022   Stasis dermatitis of both legs 07/29/2022   Myocarditis (HCC) 05/14/2022   Pericarditis 05/12/2022   Atrial fibrillation with rapid ventricular response (HCC) 05/12/2022   Sepsis (HCC) 05/11/2022   Elevated troponin level not due myocardial infarction 05/11/2022   Lactic acidosis 05/11/2022   Drug therapy 05/18/2021   Normocytic anemia    Arthritis     Bipolar 1 disorder (HCC)    Chicken pox    Chronic bronchitis (HCC)    Depression    Hyperlipidemia with target low density lipoprotein (LDL) cholesterol less than 55 mg/dL    Rheumatic fever    Essential hypertension    Chest pain 09/06/2020   Coronary artery disease involving native coronary artery of native heart with angina pectoris 09/06/2020   Angina pectoris 08/30/2020   Cardiac murmur 08/30/2020   Statin intolerance 07/31/2020   Abdominal aortic atherosclerosis    Ingrowing nail 03/12/2020   Bipolar affective disorder, currently depressed, moderate (HCC) 03/08/2020   Brain ischemia 03/08/2020   Cognitive complaints 03/08/2020   History of stroke 11/10/2019   Chronic kidney disease, stage 3a (HCC) 07/21/2019   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   Thoracic radiculopathy 06/16/2019   Intention tremor 06/16/2019   Right-sided chest wall pain 04/17/2019   Barrett's esophagus without dysplasia 03/25/2019   History of colon polyps 03/25/2019   PAD (peripheral artery disease) 03/23/2019   Benign prostatic hyperplasia with nocturia 12/29/2018   BPH with urinary obstruction 12/29/2018   GAD (generalized anxiety disorder) 11/02/2018   Rheumatoid arthritis (HCC) 10/29/2018   Lichen planus 04/07/2018   High risk medication use 03/23/2018   Seasonal allergies 03/23/2018   Long-term use of immunosuppressant medication 03/23/2018   Low testosterone 09/17/2016   Vitamin B12 deficiency 09/17/2016   Vitamin D  deficiency 09/17/2016   Disc degeneration, 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   Pain of right hip joint 01/19/2016   Herpes gingivostomatitis 02/10/2015   Midline thoracic back pain 02/10/2015   Normochromic normocytic anemia 08/16/2014   Bipolar 1 disorder, mixed, moderate (HCC) 08/16/2014   Gastroesophageal reflux disease 08/16/2014   Bipolar depression (HCC) 08/16/2014   GERD without esophagitis 08/16/2014   Bipolar 2 disorder (HCC) 08/16/2014    Past Medical History:   Diagnosis Date   Abdominal aortic atherosclerosis    Anemia    likely of chronic disease   Angina pectoris 08/30/2020   Arthritis    rheumatoid   Atrial fibrillation (HCC)    Atrial fibrillation with rapid ventricular response (HCC)  05/12/2022   06/20/23 - pt states he's not had any A-fib   Barrett's esophagus without dysplasia 03/25/2019   Formatting of this note might be different from the original. EGD done in 02/2019. Due in 3 years.   Benign prostatic hyperplasia with nocturia 12/29/2018   Bipolar 1 disorder (HCC)    Bipolar 1 disorder, mixed, moderate (HCC) 08/16/2014   Bipolar 2 disorder (HCC) 08/16/2014   Bipolar affective disorder, currently depressed, moderate (HCC) 03/08/2020   Bipolar depression (HCC) 08/16/2014   BPH with urinary obstruction 12/29/2018   Brain ischemia 03/08/2020   Cardiac murmur 08/30/2020   Chest pain 09/06/2020   Chicken pox    Chronic bronchitis (HCC)    Chronic kidney disease, stage 3a (HCC) 07/21/2019   Cognitive complaints 03/08/2020   Coronary artery disease involving native coronary artery of native heart 09/06/2020   Coronary artery disease involving native coronary artery of native heart with angina pectoris 09/06/2020   Depression    Disc degeneration, 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 Last Assessment & Plan:  Well controlled. Continue current medications.   GERD without esophagitis 08/16/2014   Formatting of this note might be different from the original. Last Assessment & Plan:  Well controlled. Continue current medications.   Herpes gingivostomatitis 02/10/2015   Hiatal hernia    High risk medication use 03/23/2018   History of colon polyps 03/25/2019   Formatting of this  note might be different from the original. tubular adenoma   History of stroke 11/10/2019   Hyperlipidemia    Hyperphosphatemia 10/30/2022   Hypertension    Ingrowing nail 03/12/2020   Intention tremor 06/16/2019   Labile hypertension 08/16/2014   Formatting of this note might be different from the original. Last Assessment & Plan:  Slightly above goal today secondary to pain. Will continue current regimen. Will check CMP today.   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 Concern over possible lichen planus. His dentist noticed lesions of his oral mucosa.  It was felt to be lichen planus.  He presents to discuss further.  Rarely does the area hurt.He smoked in the distant past EXAM shows several white patches on the buccal mucosa bilaterally.  No other concerning oral mucosal les   Lithium  use 10/30/2022   Long-term use of immunosuppressant medication 03/23/2018   Low testosterone 09/17/2016   Midline thoracic back pain 02/10/2015   Mixed hyperlipidemia 08/16/2014   Myocarditis (HCC) 05/14/2022   Normochromic normocytic anemia 08/16/2014   Normocytic anemia    likely of chronic disease   PAD (peripheral artery disease) 03/23/2019   Pain of right hip joint 01/19/2016   Pericarditis 05/12/2022   Raynaud's disease    Rheumatic fever    Rheumatoid arthritis (HCC) 10/29/2018   Formatting of this note might be different from the original. Rheum:  Dr. Ziolkowska Formerly on MTX - stopped due to mouth sores. Currently started on Arava .  Also on 5 mg prednisone  x 3 months during run-in.   Right-sided chest wall pain 04/17/2019   Seasonal allergies 03/23/2018   Sepsis (HCC) 05/11/2022   Stasis dermatitis of both legs 07/29/2022   Statin intolerance 07/31/2020  Stroke [facility])    3 - TIA's   Task-specific dystonia of hand    right hand   Thoracic radiculopathy 06/16/2019   Vitamin B12  deficiency    Injections   Vitamin D  deficiency 09/17/2016    Family History  Problem Relation Age of Onset   Alzheimer's disease Mother 26       Deceased in early 44s   Stroke Father 99       Deceased   Hypertension Father    Alcoholism Father    Alcoholism Brother    Heart disease Maternal Grandfather 41       Deceased   Heart disease Paternal Grandfather 95       Deceased   Hypertension Son    Past Surgical History:  Procedure Laterality Date   CLAVICLE SURGERY     Right, Hardware placed   CORONARY STENT INTERVENTION N/A 09/06/2020   Procedure: CORONARY STENT INTERVENTION;  Surgeon: Wonda Sharper, MD;  Location: [facility] INVASIVE CV LAB;  Service: Cardiovascular;  Laterality: N/A;   FALSE ANEURYSM REPAIR Right 06/23/2023   Procedure: RIGHT ULNAR ARTERY PSEUDOANEURYSM REPAIR;  Surgeon: Lanis Fonda BRAVO, MD;  Location: [facility] OR;  Service: Vascular;  Laterality: Right;   ingrown toenail Bilateral    great toes   LEFT HEART CATH AND CORONARY ANGIOGRAPHY N/A 09/06/2020   Procedure: LEFT HEART CATH AND CORONARY ANGIOGRAPHY;  Surgeon: Wonda Sharper, MD;  Location: [facility] INVASIVE CV LAB;  Service: Cardiovascular;  Laterality: N/A;   LEFT HEART CATH AND CORONARY ANGIOGRAPHY N/A 06/06/2023   Procedure: LEFT HEART CATH AND CORONARY ANGIOGRAPHY;  Surgeon: Anner Alm ORN, MD;  Location: [facility] INVASIVE CV LAB;  Service: Cardiovascular;  Laterality: N/A;   WISDOM TOOTH EXTRACTION     Social History   Social History Narrative   Not on file   Immunization History  Administered Date(s) Administered   DTaP 10/13/2023   Fluad  Quad(high Dose 65+) 09/03/2021, 09/04/2022, 08/29/2023   INFLUENZA, HIGH DOSE SEASONAL PF 09/10/2019, 09/13/2019, 09/03/2020   Influenza, Seasonal, Injecte, Preservative Fre 09/13/2019   Influenza,inj,Quad PF,6+ Mos 08/22/2014, 10/04/2015   Influenza-Unspecified 08/22/2014, 10/04/2015, 09/01/2024   Moderna SARS-COV2 Booster Vaccination 10/31/2020, 07/04/2021   Moderna  Sars-Covid-2 Vaccination 01/17/2020, 02/13/2020   PNEUMOCOCCAL CONJUGATE-20 01/02/2022   Pfizer(Comirnaty )Fall Seasonal Vaccine 12 years and older 09/18/2023, 09/22/2024   Pneumococcal Conjugate-13 03/17/2018   Pneumococcal Polysaccharide-23 09/16/2018   Pneumococcal-Unspecified 09/16/2018   Zoster Recombinant(Shingrix) 07/02/2017, 09/02/2017   Zoster, Live 08/17/2011     Objective: Vital Signs: BP (!) 121/54   Pulse (!) 52   Temp (!) 97 F (36.1 C)   Resp 17   Ht 5' 7 (1.702 m)   Wt 165 lb 12.8 oz (75.2 kg)   BMI 25.97 kg/m    Physical Exam Eyes:     Conjunctiva/sclera: Conjunctivae normal.  Cardiovascular:     Rate and Rhythm: Normal rate and regular rhythm.  Pulmonary:     Effort: Pulmonary effort is normal.     Breath sounds: Normal breath sounds.  Lymphadenopathy:     Cervical: No cervical adenopathy.  Skin:    General: Skin is warm and dry.  Neurological:     Mental Status: He is alert.     Comments: Some rigidity throughout extremities  Psychiatric:        Mood and Affect: Mood normal.      Musculoskeletal Exam:  Neck full ROM no tenderness Decreased shoulder range of motion bilaterally, tenderness to pressure worse on anterior joints  Elbows full  ROM no tenderness or swelling Wrists full ROM no tenderness or swelling Fingers full ROM, chronic MCP joint thickening no focal tenderness or palpable swelling Widespread low back tenderness to pressure, very restricted hip internal rotation movement Tenderness to pressure on low back at SI joints, lateral hip tenderness to pressure on both sides, Knees full ROM no tenderness or swelling, more limited by muscle stiffness proximal      Investigation: No additional findings.  Imaging: No results found.  Recent Labs: Lab Results  Component Value Date   WBC 7.1 10/06/2024   HGB 10.0 (L) 10/06/2024   PLT 238 10/06/2024   NA 138 10/06/2024   K 5.4 (H) 10/06/2024   CL 107 10/06/2024   CO2 25 10/06/2024    GLUCOSE 84 10/06/2024   BUN 39 (H) 10/06/2024   CREATININE 2.07 (H) 10/06/2024   BILITOT 0.4 10/06/2024   ALKPHOS 67 05/26/2024   AST 18 10/06/2024   ALT 13 10/06/2024   PROT 7.0 10/06/2024   ALBUMIN 4.3 05/26/2024   CALCIUM  10.0 10/06/2024   GFRAA 75 11/28/2020   QFTBGOLDPLUS NEGATIVE 06/29/2024    Speciality Comments: No specialty comments available.  Procedures:  No procedures performed Allergies: Methotrexate, Nsaids, Plaquenil [hydroxychloroquine], Azulfidine [sulfasalazine], Crestor  [rosuvastatin ], Iodinated contrast media, Isosorbide , Mevacor  [lovastatin ], Pravachol  [pravastatin ], and Ranexa  [ranolazine  er]   Assessment / Plan:     Visit Diagnoses: Rheumatoid arthritis involving multiple sites, unspecified whether rheumatoid factor present (HCC) - Plan: leflunomide  (ARAVA ) 20 MG tablet, pregabalin (LYRICA) 25 MG capsule Managed with leflunomide  20 mg daily without issues. - Continue leflunomide  20 mg once daily.  High risk medication use - leflunomide  20 mg daily - Plan: CBC with Differential/Platelet, Comprehensive metabolic panel with GFR, Lipid panel Has been tolerating leflunomide  without any reported side effect.  No serious interval infections. - Checking CBC CMP and lipid panel for medication monitoring continue long-term treatment with leflunomide   Raynaud's phenomenon Causes fingers to turn blue and feel useless.  Essential tremor Significant tremors in right hand. Gabapentin  caused sedation. - Consider trial of gabapentin  at a lower dose at night.  Peripheral neuropathy, intermittent Affects feet at night. Gabapentin  caused sedation. - Consider trial of gabapentin  at a lower dose at night.  Hypertension Managed with three antihypertensives. Blood pressure 120s-140s/50s. No dizziness or lightheadedness, but balance issues noted. - Continue current medications with adjusted dosing.  Cardiac issues Scar tissue may cause arrhythmias. Under heart monitoring.  Myocarditis and heart thickening noted. Blood pressure management crucial. - Continue heart monitoring. - Proceed with cardiac MRI in December.        Orders: Orders Placed This Encounter  Procedures   CBC with Differential/Platelet   Comprehensive metabolic panel with GFR   Lipid panel   Meds ordered this encounter  Medications   leflunomide  (ARAVA ) 20 MG tablet    Sig: Take 1 tablet (20 mg total) by mouth daily.    Dispense:  90 tablet    Refill:  0   pregabalin (LYRICA) 25 MG capsule    Sig: Take 1 capsule (25 mg total) by mouth at bedtime.    Dispense:  30 capsule    Refill:  2     Follow-Up Instructions: Return in about 3 months (around 01/06/2025) for RA on LEF f/u 3mos.   Lonni LELON Ester, MD  Note - This record has been created using [employer].  Chart creation errors have been sought, but may not always  have been located. Such creation errors do not reflect on  the standard of medical care.

## 2024-09-30 ENCOUNTER — Ambulatory Visit (INDEPENDENT_AMBULATORY_CARE_PROVIDER_SITE_OTHER)

## 2024-09-30 DIAGNOSIS — E538 Deficiency of other specified B group vitamins: Secondary | ICD-10-CM | POA: Diagnosis not present

## 2024-09-30 MED ORDER — CYANOCOBALAMIN 1000 MCG/ML IJ SOLN
1000.0000 ug | Freq: Once | INTRAMUSCULAR | Status: AC
  Administered 2024-09-30: 1000 ug via INTRAMUSCULAR

## 2024-09-30 NOTE — Progress Notes (Signed)
 Pt here today for B12 injection per Dr. Frann   Cyanocobalamin  1mL injected into L deltoid. Pt tolerated injection well.    Next in 1 month

## 2024-10-04 DIAGNOSIS — Z955 Presence of coronary angioplasty implant and graft: Secondary | ICD-10-CM | POA: Diagnosis not present

## 2024-10-04 DIAGNOSIS — G250 Essential tremor: Secondary | ICD-10-CM | POA: Diagnosis not present

## 2024-10-04 DIAGNOSIS — I2510 Atherosclerotic heart disease of native coronary artery without angina pectoris: Secondary | ICD-10-CM | POA: Diagnosis not present

## 2024-10-04 DIAGNOSIS — E782 Mixed hyperlipidemia: Secondary | ICD-10-CM | POA: Diagnosis not present

## 2024-10-04 DIAGNOSIS — Z8739 Personal history of other diseases of the musculoskeletal system and connective tissue: Secondary | ICD-10-CM | POA: Diagnosis not present

## 2024-10-04 DIAGNOSIS — M4800 Spinal stenosis, site unspecified: Secondary | ICD-10-CM | POA: Diagnosis not present

## 2024-10-04 DIAGNOSIS — I10 Essential (primary) hypertension: Secondary | ICD-10-CM | POA: Diagnosis not present

## 2024-10-04 DIAGNOSIS — Z8679 Personal history of other diseases of the circulatory system: Secondary | ICD-10-CM | POA: Diagnosis not present

## 2024-10-04 DIAGNOSIS — I4510 Unspecified right bundle-branch block: Secondary | ICD-10-CM | POA: Diagnosis not present

## 2024-10-06 ENCOUNTER — Ambulatory Visit: Attending: Internal Medicine | Admitting: Internal Medicine

## 2024-10-06 ENCOUNTER — Encounter: Payer: Self-pay | Admitting: Internal Medicine

## 2024-10-06 VITALS — BP 121/54 | HR 52 | Temp 97.0°F | Resp 17 | Ht 67.0 in | Wt 165.8 lb

## 2024-10-06 DIAGNOSIS — M549 Dorsalgia, unspecified: Secondary | ICD-10-CM

## 2024-10-06 DIAGNOSIS — I309 Acute pericarditis, unspecified: Secondary | ICD-10-CM

## 2024-10-06 DIAGNOSIS — Z79899 Other long term (current) drug therapy: Secondary | ICD-10-CM | POA: Diagnosis not present

## 2024-10-06 DIAGNOSIS — I872 Venous insufficiency (chronic) (peripheral): Secondary | ICD-10-CM | POA: Diagnosis not present

## 2024-10-06 DIAGNOSIS — R2681 Unsteadiness on feet: Secondary | ICD-10-CM

## 2024-10-06 DIAGNOSIS — M069 Rheumatoid arthritis, unspecified: Secondary | ICD-10-CM | POA: Diagnosis not present

## 2024-10-06 DIAGNOSIS — G8929 Other chronic pain: Secondary | ICD-10-CM | POA: Diagnosis not present

## 2024-10-06 MED ORDER — LEFLUNOMIDE 20 MG PO TABS: 20.0000 mg | ORAL_TABLET | Freq: Every day | ORAL | 0 refills | Status: DC

## 2024-10-06 MED ORDER — PREGABALIN 25 MG PO CAPS: 25.0000 mg | ORAL_CAPSULE | Freq: Every day | ORAL | 2 refills | Status: AC

## 2024-10-07 LAB — LIPID PANEL
Cholesterol: 111 mg/dL (ref <200)
HDL: 33 mg/dL — ABNORMAL LOW (ref >=40)
LDL Cholesterol (Calc): 55 mg/dL
Non-HDL Cholesterol (Calc): 78 mg/dL (ref <130)
Total CHOL/HDL Ratio: 3.4 (calc) (ref <5.0)
Triglycerides: 151 mg/dL — ABNORMAL HIGH (ref <150)

## 2024-10-07 LAB — COMPREHENSIVE METABOLIC PANEL WITH GFR
AG Ratio: 2.2 (calc) (ref 1.0–2.5)
ALT: 13 U/L (ref 9–46)
AST: 18 U/L (ref 10–35)
Albumin: 4.8 g/dL (ref 3.6–5.1)
Alkaline phosphatase (APISO): 53 U/L (ref 35–144)
BUN/Creatinine Ratio: 19 (calc) (ref 6–22)
BUN: 39 mg/dL — ABNORMAL HIGH (ref 7–25)
CO2: 25 mmol/L (ref 20–32)
Calcium: 10 mg/dL (ref 8.6–10.3)
Chloride: 107 mmol/L (ref 98–110)
Creat: 2.07 mg/dL — ABNORMAL HIGH (ref 0.70–1.28)
Globulin: 2.2 g/dL (ref 1.9–3.7)
Glucose, Bld: 84 mg/dL (ref 65–99)
Potassium: 5.4 mmol/L — ABNORMAL HIGH (ref 3.5–5.3)
Sodium: 138 mmol/L (ref 135–146)
Total Bilirubin: 0.4 mg/dL (ref 0.2–1.2)
Total Protein: 7 g/dL (ref 6.1–8.1)
eGFR: 33 mL/min/1.73m2 — ABNORMAL LOW (ref >=60)

## 2024-10-07 LAB — CBC WITH DIFFERENTIAL/PLATELET
Absolute Lymphocytes: 447 {cells}/uL — ABNORMAL LOW (ref 850–3900)
Absolute Monocytes: 866 {cells}/uL (ref 200–950)
Basophils Absolute: 57 {cells}/uL (ref 0–200)
Basophils Relative: 0.8 %
Eosinophils Absolute: 327 {cells}/uL (ref 15–500)
Eosinophils Relative: 4.6 %
HCT: 30.9 % — ABNORMAL LOW (ref 38.5–50.0)
Hemoglobin: 10 g/dL — ABNORMAL LOW (ref 13.2–17.1)
MCH: 30.2 pg (ref 27.0–33.0)
MCHC: 32.4 g/dL (ref 32.0–36.0)
MCV: 93.4 fL (ref 80.0–100.0)
MPV: 12.3 fL (ref 7.5–12.5)
Monocytes Relative: 12.2 %
Neutro Abs: 5403 {cells}/uL (ref 1500–7800)
Neutrophils Relative %: 76.1 %
Platelets: 238 Thousand/uL (ref 140–400)
RBC: 3.31 Million/uL — ABNORMAL LOW (ref 4.20–5.80)
RDW: 11.7 % (ref 11.0–15.0)
Total Lymphocyte: 6.3 %
WBC: 7.1 Thousand/uL (ref 3.8–10.8)

## 2024-10-10 DIAGNOSIS — I129 Hypertensive chronic kidney disease with stage 1 through stage 4 chronic kidney disease, or unspecified chronic kidney disease: Secondary | ICD-10-CM | POA: Diagnosis not present

## 2024-10-10 DIAGNOSIS — R4182 Altered mental status, unspecified: Secondary | ICD-10-CM | POA: Diagnosis not present

## 2024-10-10 DIAGNOSIS — F419 Anxiety disorder, unspecified: Secondary | ICD-10-CM | POA: Diagnosis not present

## 2024-10-10 DIAGNOSIS — Z87891 Personal history of nicotine dependence: Secondary | ICD-10-CM | POA: Diagnosis not present

## 2024-10-10 DIAGNOSIS — R4781 Slurred speech: Secondary | ICD-10-CM | POA: Diagnosis not present

## 2024-10-10 DIAGNOSIS — N189 Chronic kidney disease, unspecified: Secondary | ICD-10-CM | POA: Diagnosis not present

## 2024-10-11 ENCOUNTER — Encounter: Payer: Self-pay | Admitting: Radiology

## 2024-10-14 DIAGNOSIS — N1832 Chronic kidney disease, stage 3b: Secondary | ICD-10-CM | POA: Diagnosis not present

## 2024-10-20 DIAGNOSIS — M069 Rheumatoid arthritis, unspecified: Secondary | ICD-10-CM | POA: Diagnosis not present

## 2024-10-20 DIAGNOSIS — N1832 Chronic kidney disease, stage 3b: Secondary | ICD-10-CM | POA: Diagnosis not present

## 2024-10-20 DIAGNOSIS — R0989 Other specified symptoms and signs involving the circulatory and respiratory systems: Secondary | ICD-10-CM | POA: Diagnosis not present

## 2024-10-20 DIAGNOSIS — E875 Hyperkalemia: Secondary | ICD-10-CM | POA: Diagnosis not present

## 2024-11-01 ENCOUNTER — Ambulatory Visit (INDEPENDENT_AMBULATORY_CARE_PROVIDER_SITE_OTHER)

## 2024-11-01 ENCOUNTER — Other Ambulatory Visit (INDEPENDENT_AMBULATORY_CARE_PROVIDER_SITE_OTHER)

## 2024-11-01 ENCOUNTER — Ambulatory Visit: Payer: Self-pay | Admitting: Family Medicine

## 2024-11-01 DIAGNOSIS — E538 Deficiency of other specified B group vitamins: Secondary | ICD-10-CM

## 2024-11-01 DIAGNOSIS — I25119 Atherosclerotic heart disease of native coronary artery with unspecified angina pectoris: Secondary | ICD-10-CM | POA: Diagnosis not present

## 2024-11-01 LAB — HEPATIC FUNCTION PANEL
ALT: 14 U/L (ref 0–53)
AST: 17 U/L (ref 0–37)
Albumin: 4.7 g/dL (ref 3.5–5.2)
Alkaline Phosphatase: 45 U/L (ref 39–117)
Bilirubin, Direct: 0.1 mg/dL (ref 0.0–0.3)
Total Bilirubin: 0.5 mg/dL (ref 0.2–1.2)
Total Protein: 6.6 g/dL (ref 6.0–8.3)

## 2024-11-01 MED ORDER — CYANOCOBALAMIN 1000 MCG/ML IJ SOLN: 1000.0000 ug | Freq: Once | INTRAMUSCULAR | 0 refills | Status: AC

## 2024-11-01 NOTE — Progress Notes (Signed)
 Pt here for monthly B12 injection per original order dated: 10/15025 B12 deficiency - Plan: B12   Last B12 injection:09/30/24  Last B12 level:  09/22/24 was 447  B12 given IM right deltoid, and pt tolerated injection well.  Per pcp patient was scheduled to come in for B12 check next month. Treatment will depend on results.

## 2024-11-04 ENCOUNTER — Other Ambulatory Visit: Payer: Self-pay | Admitting: Family Medicine

## 2024-11-11 ENCOUNTER — Other Ambulatory Visit: Payer: Self-pay | Admitting: Family Medicine

## 2024-11-12 ENCOUNTER — Emergency Department (HOSPITAL_BASED_OUTPATIENT_CLINIC_OR_DEPARTMENT_OTHER)

## 2024-11-12 ENCOUNTER — Other Ambulatory Visit: Payer: Self-pay

## 2024-11-12 ENCOUNTER — Encounter (HOSPITAL_BASED_OUTPATIENT_CLINIC_OR_DEPARTMENT_OTHER): Payer: Self-pay

## 2024-11-12 ENCOUNTER — Emergency Department (HOSPITAL_BASED_OUTPATIENT_CLINIC_OR_DEPARTMENT_OTHER)
Admission: EM | Admit: 2024-11-12 | Discharge: 2024-11-12 | Disposition: A | Discharge status: Home / Self Care | Attending: Emergency Medicine | Admitting: Emergency Medicine

## 2024-11-12 DIAGNOSIS — W1839XA Other fall on same level, initial encounter: Secondary | ICD-10-CM | POA: Insufficient documentation

## 2024-11-12 DIAGNOSIS — S42031A Displaced fracture of lateral end of right clavicle, initial encounter for closed fracture: Secondary | ICD-10-CM | POA: Diagnosis not present

## 2024-11-12 DIAGNOSIS — M19011 Primary osteoarthritis, right shoulder: Secondary | ICD-10-CM | POA: Diagnosis not present

## 2024-11-12 DIAGNOSIS — S0990XA Unspecified injury of head, initial encounter: Secondary | ICD-10-CM | POA: Diagnosis not present

## 2024-11-12 DIAGNOSIS — D7218 Eosinophilia in diseases classified elsewhere: Secondary | ICD-10-CM | POA: Diagnosis not present

## 2024-11-12 DIAGNOSIS — Y9301 Activity, walking, marching and hiking: Secondary | ICD-10-CM | POA: Insufficient documentation

## 2024-11-12 DIAGNOSIS — W19XXXA Unspecified fall, initial encounter: Secondary | ICD-10-CM

## 2024-11-12 DIAGNOSIS — Z7982 Long term (current) use of aspirin: Secondary | ICD-10-CM | POA: Insufficient documentation

## 2024-11-12 DIAGNOSIS — I2510 Atherosclerotic heart disease of native coronary artery without angina pectoris: Secondary | ICD-10-CM | POA: Diagnosis not present

## 2024-11-12 DIAGNOSIS — M4802 Spinal stenosis, cervical region: Secondary | ICD-10-CM | POA: Diagnosis not present

## 2024-11-12 DIAGNOSIS — N189 Chronic kidney disease, unspecified: Secondary | ICD-10-CM | POA: Diagnosis not present

## 2024-11-12 DIAGNOSIS — I401 Isolated myocarditis: Secondary | ICD-10-CM | POA: Diagnosis not present

## 2024-11-12 DIAGNOSIS — M47812 Spondylosis without myelopathy or radiculopathy, cervical region: Secondary | ICD-10-CM | POA: Diagnosis not present

## 2024-11-12 DIAGNOSIS — D649 Anemia, unspecified: Secondary | ICD-10-CM | POA: Insufficient documentation

## 2024-11-12 DIAGNOSIS — S42034A Nondisplaced fracture of lateral end of right clavicle, initial encounter for closed fracture: Secondary | ICD-10-CM | POA: Diagnosis not present

## 2024-11-12 DIAGNOSIS — Z8673 Personal history of transient ischemic attack (TIA), and cerebral infarction without residual deficits: Secondary | ICD-10-CM | POA: Insufficient documentation

## 2024-11-12 DIAGNOSIS — Z8679 Personal history of other diseases of the circulatory system: Secondary | ICD-10-CM | POA: Diagnosis not present

## 2024-11-12 DIAGNOSIS — S199XXA Unspecified injury of neck, initial encounter: Secondary | ICD-10-CM | POA: Diagnosis not present

## 2024-11-12 DIAGNOSIS — I6782 Cerebral ischemia: Secondary | ICD-10-CM | POA: Diagnosis not present

## 2024-11-12 LAB — CBC WITH DIFFERENTIAL/PLATELET
Abs Immature Granulocytes: 0.02 K/uL (ref 0.00–0.07)
Basophils Absolute: 0 K/uL (ref 0.0–0.1)
Basophils Relative: 1 %
Eosinophils Absolute: 0.1 K/uL (ref 0.0–0.5)
Eosinophils Relative: 2 %
HCT: 28.2 % — ABNORMAL LOW (ref 39.0–52.0)
Hemoglobin: 8.9 g/dL — ABNORMAL LOW (ref 13.0–17.0)
Immature Granulocytes: 0 %
Lymphocytes Relative: 5 %
Lymphs Abs: 0.4 K/uL — ABNORMAL LOW (ref 0.7–4.0)
MCH: 30.4 pg (ref 26.0–34.0)
MCHC: 31.6 g/dL (ref 30.0–36.0)
MCV: 96.2 fL (ref 80.0–100.0)
Monocytes Absolute: 0.9 K/uL (ref 0.1–1.0)
Monocytes Relative: 13 %
Neutro Abs: 5.1 K/uL (ref 1.7–7.7)
Neutrophils Relative %: 79 %
Platelets: 209 K/uL (ref 150–400)
RBC: 2.93 MIL/uL — ABNORMAL LOW (ref 4.22–5.81)
RDW: 12.9 % (ref 11.5–15.5)
WBC: 6.5 K/uL (ref 4.0–10.5)
nRBC: 0 % (ref 0.0–0.2)

## 2024-11-12 LAB — TROPONIN T, HIGH SENSITIVITY: Troponin T High Sensitivity: 15 ng/L (ref 0–19)

## 2024-11-12 LAB — BASIC METABOLIC PANEL WITH GFR
Anion gap: 7 (ref 5–15)
BUN: 32 mg/dL — ABNORMAL HIGH (ref 8–23)
CO2: 23 mmol/L (ref 22–32)
Calcium: 9.3 mg/dL (ref 8.9–10.3)
Chloride: 108 mmol/L (ref 98–111)
Creatinine, Ser: 1.92 mg/dL — ABNORMAL HIGH (ref 0.61–1.24)
GFR, Estimated: 37 mL/min — ABNORMAL LOW (ref >60)
Glucose, Bld: 113 mg/dL — ABNORMAL HIGH (ref 70–99)
Potassium: 5.2 mmol/L — ABNORMAL HIGH (ref 3.5–5.1)
Sodium: 138 mmol/L (ref 135–145)

## 2024-11-12 MED ORDER — ACETAMINOPHEN 325 MG PO TABS
650.0000 mg | ORAL_TABLET | Freq: Once | ORAL | Status: AC
  Administered 2024-11-12: 650 mg via ORAL
  Filled 2024-11-12: qty 2

## 2024-11-12 NOTE — ED Provider Notes (Signed)
 [place] [facility] Provider Note   CSN: [id number] Arrival date & time: 11/12/24  1800     Patient presents with: Noah Grant is a 73 y.o. male.   HPI Patient is 73 year old male presented ED today for concerns for unwitnessed fall this evening, noted to be walking to the mailbox where he was walking and then I was on the ground.  Does not believe that he lost consciousness, does not believe that he hit his head.  Is not on blood thinners.  Has not tried ambulating since the incident, noted to have a reported downtime of approximately 2 to 3 minutes.  Currently is only expressing right shoulder pain.  Reports that he does occasionally loses balance from time to time which has been ongoing for some time and suspected to be secondary to A-fib with patient bleeding that he does not have A-fib.  Previous medical history of Anemia, HLD, bipolar, CAD, rheumatoid arthritis, GAD, myocarditis, CKD, GERD, chronic bronchiolitis, A-fib, stroke  Denies headache, vision changes, vertigo, current chest pain/current shortness of breath, cough, congestion, abdominal pain, nausea, vomiting, diarrhea, dysuria, melena, hematochezia, worsening lower leg swelling, rashes.    Prior to Admission medications   Medication Sig Start Date End Date Taking? Authorizing Provider  amLODipine  (NORVASC ) 10 MG tablet TAKE 1 TABLET BY MOUTH DAILY 09/13/24   Frann, Mabel Mt, DO  aspirin  EC 81 MG tablet Take 1 tablet (81 mg total) by mouth daily. Swallow whole. 06/24/23   Rhyne, Samantha J, PA-C  Cholecalciferol 50 MCG (2000 UT) CAPS Take 3,000 Units by mouth daily.    [provider]  Coenzyme Q10 (COQ10) 200 MG CAPS Take 200 mg by mouth daily.    [provider]  diclofenac  Sodium (VOLTAREN ) 1 % GEL Apply 1 application  topically daily as needed for pain.    [provider]  ezetimibe  (ZETIA ) 10 MG tablet TAKE 1 TABLET BY MOUTH DAILY  03/16/24   Meng, Hao, PA  fenofibrate  (TRICOR ) 48 MG tablet Take 1 tablet (48 mg total) by mouth daily. 09/23/24   Frann Mabel Mt, DO  finasteride  (PROSCAR ) 5 MG tablet TAKE 1 TABLET BY MOUTH DAILY 09/13/24   [name], Mabel Mt, DO  hydrALAZINE  (APRESOLINE ) 100 MG tablet Take 1 tablet (100 mg total) by mouth 2 (two) times daily. 07/05/24   Frann Mabel Mt, DO  leflunomide  (ARAVA ) 20 MG tablet Take 1 tablet (20 mg total) by mouth daily. 10/06/24   Jeannetta Lonni ORN, MD  levothyroxine  (SYNTHROID ) 25 MCG tablet TAKE 1 TABLET BY MOUTH DAILY BEFORE BREAKFAST 11/11/24   Antonio Meth, Jamee JONELLE, DO  lithium  carbonate 150 MG capsule Take 3 capsules (450 mg total) by mouth daily. 09/08/24   Cottle, Lorene KANDICE Raddle., MD  Lysine  HCl 1000 MG TABS Take 1,000 mg by mouth 2 (two) times daily.  Patient taking differently: Take 1,000 mg by mouth daily.    [provider]  nitroGLYCERIN  (NITROSTAT ) 0.4 MG SL tablet Place 1 tablet (0.4 mg total) under the tongue every 5 (five) minutes as needed for chest pain. 04/04/23   Revankar, Jennifer JONELLE, MD  olmesartan  (BENICAR ) 40 MG tablet TAKE 1 TABLET BY MOUTH DAILY 03/15/24   Frann Mabel Mt, DO  omeprazole  (PRILOSEC) 40 MG capsule TAKE 1 CAPSULE BY MOUTH 2 TIMES A DAY BEFORE MEALS 11/06/24   [name], Mabel Mt, DO  pregabalin  (LYRICA ) 25 MG capsule Take 1 capsule (25 mg total) by mouth at  bedtime. 10/06/24   Jeannetta Lonni ORN, MD  simvastatin  (ZOCOR ) 5 MG tablet TAKE 1 TABLET BY MOUTH DAILY 03/15/24   Frann Mabel Mt, DO    Allergies: Methotrexate, Nsaids, Plaquenil [hydroxychloroquine], Azulfidine [sulfasalazine], Crestor  [rosuvastatin ], Iodinated contrast media, Isosorbide , Mevacor  [lovastatin ], Pravachol  [pravastatin ], and Ranexa  [ranolazine  er]    Review of Systems  Musculoskeletal:  Positive for arthralgias.  All other systems reviewed and are negative.   Updated Vital Signs BP (!) 144/62   Pulse 63   Temp 98.4 F (36.9 C)    Resp 14   Ht 5' 7 (1.702 m)   Wt 77.9 kg   SpO2 97%   BMI 26.90 kg/m   Physical Exam Vitals and nursing note reviewed.  Constitutional:      General: He is not in acute distress.    Appearance: Normal appearance. He is not ill-appearing or diaphoretic.  HENT:     Head: Normocephalic and atraumatic.  Eyes:     General: No scleral icterus.       Right eye: No discharge.        Left eye: No discharge.     Extraocular Movements: Extraocular movements intact.     Conjunctiva/sclera: Conjunctivae normal.     Pupils: Pupils are equal, round, and reactive to light.  Cardiovascular:     Rate and Rhythm: Normal rate and regular rhythm.     Pulses: Normal pulses.     Heart sounds: Normal heart sounds. No murmur heard.    No friction rub. No gallop.  Pulmonary:     Effort: Pulmonary effort is normal. No respiratory distress.     Breath sounds: No stridor. No wheezing, rhonchi or rales.  Chest:     Chest wall: No tenderness.  Abdominal:     General: Abdomen is flat. There is no distension.     Palpations: Abdomen is soft.     Tenderness: There is no abdominal tenderness. There is no right CVA tenderness, left CVA tenderness, guarding or rebound.  Musculoskeletal:        General: Tenderness present. No swelling, deformity or signs of injury.     Cervical back: Normal range of motion and neck supple. Tenderness (Patient notably has chronic midline cervical tenderness to palpation, which is not worse than normal.) present. No rigidity.     Right lower leg: No edema.     Left lower leg: No edema.     Comments: Notably tender over right AC joint.  No bruising noted.  Has good range of motion at elbow and wrist bilaterally, limited range of motion on right shoulder secondary to pain.  No bruising or ecchymosis is noted.   Small abrasion noted to right elbow.  No injuries noted otherwise to lower extremities, upper extremities.  Skin:    General: Skin is warm and dry.     Findings: No  bruising, erythema or lesion.  Neurological:     General: No focal deficit present.     Mental Status: He is alert and oriented to person, place, and time. Mental status is at baseline.     Sensory: No sensory deficit.     Motor: No weakness.     Comments: No facial asymmetry, no ataxia, no apraxia, no aphasia, normal sensation to both upper and lower extremities bilaterally, normal grip strength bilaterally, normal strength bilaterally to lower extremities to both extension and flexion 5, no visual field deficits, no nystagmus.  Unable to assess upper extremity range of motion secondary to pain in  right shoulder.  Notably does have chronic right arm tremor which has has been ongoing for many years.     Psychiatric:        Mood and Affect: Mood normal.     (all labs ordered are listed, but only abnormal results are displayed) Labs Reviewed  CBC WITH DIFFERENTIAL/PLATELET - Abnormal; Notable for the following components:      Result Value   RBC 2.93 (*)    Hemoglobin 8.9 (*)    HCT 28.2 (*)    Lymphs Abs 0.4 (*)    All other components within normal limits  BASIC METABOLIC PANEL WITH GFR - Abnormal; Notable for the following components:   Potassium 5.2 (*)    Glucose, Bld 113 (*)    BUN 32 (*)    Creatinine, Ser 1.92 (*)    GFR, Estimated 37 (*)    All other components within normal limits  TROPONIN T, HIGH SENSITIVITY  TROPONIN T, HIGH SENSITIVITY    EKG: EKG Interpretation Date/Time:  Friday November 12 2024 18:44:01 EST Ventricular Rate:  69 PR Interval:  179 QRS Duration:  140 QT Interval:  415 QTC Calculation: 445 R Axis:   89  Text Interpretation: Sinus rhythm Right bundle branch block No significant change since last tracing Confirmed by Ellouise Fine ([id number]) on 11/12/2024 9:12:46 PM  Radiology: [facility]COLA Shoulder Right Result Date: 11/12/2024 CLINICAL DATA:  Clemens, right shoulder pain EXAM: RIGHT SHOULDER - 2+ VIEW COMPARISON:  04/19/2022 FINDINGS: Internal rotation,  external rotation, and transscapular views of the right shoulder are obtained. Prior plate and screw fixation of the right clavicle is noted, with no change in orthopedic hardware. There is an acute comminuted distal right clavicular fracture, just beyond the lateral margin of the plate. Mild osteoarthritis of the acromioclavicular and glenohumeral joints. Multiple prior healed right rib fractures. Right chest is clear. IMPRESSION: 1. Acute distal right clavicular fracture, distal to the lateral margin of the previous plate and screw fixation. Electronically Signed   By: Ozell Daring M.D.   On: 11/12/2024 19:21   CT Cervical Spine Wo Contrast Result Date: 11/12/2024 CLINICAL DATA:  Clemens, neck trauma EXAM: CT CERVICAL SPINE WITHOUT CONTRAST TECHNIQUE: Multidetector CT imaging of the cervical spine was performed without intravenous contrast. Multiplanar CT image reconstructions were also generated. RADIATION DOSE REDUCTION: This exam was performed according to the departmental dose-optimization program which includes automated exposure control, adjustment of the mA and/or kV according to patient size and/or use of iterative reconstruction technique. COMPARISON:  None Available. FINDINGS: Alignment: Alignment is anatomic. Skull base and vertebrae: No acute fracture. No primary bone lesion or focal pathologic process. Soft tissues and spinal canal: No prevertebral fluid or swelling. No visible canal hematoma. Disc levels: Multilevel spondylosis and facet hypertrophy, with disc space narrowing and osteophyte formation most pronounced at C4-5 and C6-7. Upper chest: Airway is patent. Visualized portions of the lung apices are clear. Other: Reconstructed images demonstrate no additional findings. IMPRESSION: 1. No acute cervical spine fracture. Electronically Signed   By: Ozell Daring M.D.   On: 11/12/2024 19:19   CT Head Wo Contrast Result Date: 11/12/2024 CLINICAL DATA:  Clemens, right shoulder pain EXAM: CT HEAD  WITHOUT CONTRAST TECHNIQUE: Contiguous axial images were obtained from the base of the skull through the vertex without intravenous contrast. RADIATION DOSE REDUCTION: This exam was performed according to the departmental dose-optimization program which includes automated exposure control, adjustment of the mA and/or kV according to patient size and/or use of iterative  reconstruction technique. COMPARISON:  05/11/2022 FINDINGS: Brain: Hypodensities within the periventricular white matter and bilateral basal ganglia are again noted, consistent with chronic small vessel ischemic changes. No evidence of acute infarct or hemorrhage. The lateral ventricles and remaining midline structures are unremarkable. No acute extra-axial fluid collections. No mass effect. Vascular: No hyperdense vessel or unexpected calcification. Skull: Normal. Negative for fracture or focal lesion. Sinuses/Orbits: No acute finding. Other: None. IMPRESSION: 1. No acute intracranial process. 2. Chronic small vessel ischemic changes within the periventricular white matter and basal ganglia. Electronically Signed   By: Ozell Daring M.D.   On: 11/12/2024 19:17    Procedures   Medications Ordered in the ED  acetaminophen  (TYLENOL ) tablet 650 mg (650 mg Oral Given 11/12/24 1946)    Medical Decision Making Amount and/or Complexity of Data Reviewed Labs: ordered. Radiology: ordered. ECG/medicine tests: ordered.  Risk OTC drugs.  This patient is a 73 year old male who presents to the ED for concern of fall, noted to open walk to his mailbox when he then noticed that he had fallen on the ground, noted to have history of instability and frequent falls.  Did not lose consciousness, did not hit head, had suspected downtime of 2 to 3 minutes but fall was unwitnessed.  Complaining of right shoulder pain.  On physical exam, patient is in no acute distress, afebrile, alert and orient x 4, speaking in full sentences, nontachypneic,  nontachycardic.  Notable does have some tenderness to right shoulder over clavicle and AC joint.  Exam otherwise not noticeable for any acute trauma.    Due to patient's age and fall being unwitnessed and patient unsure of whether or not he tripped or something caused him to fall, basic labs were done including troponin.  Noticeably did have hemoglobin that was 1 point lower than normal.  Will have continued follow-up PCP for further evaluation.  X-ray did note fracture to clavicle distally.  Will have him put in sling and follow-up with orthopedic surgery.  Lab work and imaging were otherwise unremarkable for any acute abnormalities.  Lab and follow-up with PCP and orthopedic surgery.  Placed in sling.  Case was discussed with attending who agree with plan.  Patient vital signs have remained stable throughout the course of patient's time in the ED. Low suspicion for any other emergent pathology at this time. I believe this patient is safe to be discharged. Provided strict return to ER precautions. Patient expressed agreement and understanding of plan. All questions were answered.  Differential diagnoses prior to evaluation: The emergent differential diagnosis includes, but is not limited to, fracture, ligamentous injury, neurovascular injury, dislocation, malalignment, intracranial bleed, this is not an exhaustive differential.   Past Medical History / Co-morbidities / Social History: Anemia, HLD, bipolar, CAD, rheumatoid arthritis, GAD, myocarditis, CKD, GERD, chronic bronchiolitis, A-fib, stroke  Additional history: Chart reviewed. Pertinent results include:   Had cardiac MRI today.  Showing normal LV function and EF.  Showing some right ventricle enlargement but normal function, no significant valvular abnormalities, no myocardial edema and what appears to be healed myocarditis.  Last seen by nephrology on 10/20/2024  Lab Tests/Imaging studies: I personally interpreted labs/imaging and  the pertinent results include:   CBC notes a anemia with hemoglobin 8.9 decreased from previous by 1 point. BMP notes a potassium 5.2 as well as an improved creatinine and GFR when compared to previous Troponin unremarkable CT head and cervical spine did not show any acute process. X-ray of right shoulder did show distal  clavicular fracture.   I agree with the radiologist interpretation.  Cardiac monitoring: EKG obtained and interpreted by myself and attending physician which shows:   EKG Interpretation Date/Time:  Friday November 12 2024 18:44:01 EST Ventricular Rate:  69 PR Interval:  179 QRS Duration:  140 QT Interval:  415 QTC Calculation: 445 R Axis:   89  Text Interpretation: Sinus rhythm Right bundle branch block No significant change since last tracing Confirmed by Ellouise Fine ([id number]) on 11/12/2024 9:12:46 PM          Medications: I ordered medication including Tylenol .  I have reviewed the patients home medicines and have made adjustments as needed.  Critical Interventions: None  Social Determinants of Health: Has good follow-up with orthopedic surgery.  Disposition: After consideration of the diagnostic results and the patients response to treatment, I feel that the patient would benefit from discharge and shortness of breath.   emergency department workup does not suggest an emergent condition requiring admission or immediate intervention beyond what has been performed at this time. The plan is: Follow-up with orthopedic surgery, follow-up with PCP for lab redraw, sling, return for new or worsening symptoms. The patient is safe for discharge and has been instructed to return immediately for worsening symptoms, change in symptoms or any other concerns.   Final diagnoses:  Closed nondisplaced fracture of acromial end of right clavicle, initial encounter  Fall, initial encounter  Anemia, unspecified type    ED Discharge Orders     None           Beola Terrall RAMAN, PA-C 11/12/24 2236    Ellouise Fine K, DO 11/12/24 2322

## 2024-11-12 NOTE — Discharge Instructions (Addendum)
 You were seen today for injuries after fall as well as notably having some anemia today.  Would like for you to continue to follow-up with your PCP for a lab redraw to recheck your anemia as it was lower than it previously was.  Return to the ER if again to develop any shortness of breath, chest pain or worsening weakness fatigue.  Additionally you will need to continue follow-up with orthopedic surgery, I have attached our orthopedic surgeon for you to call their office and schedule an appointment to be seen later this next week.  Continue to do gentle shoulder movements and keep in sling for comfort.  Use Tylenol  as needed for pain.  Take Tylenol  (acetominophen)  650mg  every 4-6 hours, as needed for pain or fever. Do not take more than 4,000 mg in a 24-hour period. As this may cause liver damage. While this is rare, if you begin to develop yellowing of the skin or eyes, stop taking and return to ER immediately.

## 2024-11-12 NOTE — ED Triage Notes (Signed)
 Pt states that he walking outside and lost his footing and fell. States that he is not sure how he fell. Denies hitting head. Voices pain of right shoulder pain. States that he takes a baby aspirin  daily. Denies LOC

## 2024-11-15 ENCOUNTER — Telehealth: Payer: Self-pay | Admitting: Orthopedic Surgery

## 2024-11-15 NOTE — Telephone Encounter (Signed)
 I called and spoke with patient and advised that he had Appt 12/06/24 with Dr. Georgina, I advised that we will look at his clavicle then, he is to use the sling and be NWTB on this arm

## 2024-11-15 NOTE — Telephone Encounter (Signed)
 Patient called and said he broke his R clavical and needs him to look at it. CB#[phone]

## 2024-11-17 DIAGNOSIS — S42031A Displaced fracture of lateral end of right clavicle, initial encounter for closed fracture: Secondary | ICD-10-CM | POA: Diagnosis not present

## 2024-11-19 ENCOUNTER — Ambulatory Visit: Admitting: Family Medicine

## 2024-11-19 ENCOUNTER — Encounter: Payer: Self-pay | Admitting: Family Medicine

## 2024-11-19 VITALS — BP 134/68 | HR 78 | Temp 98.0°F | Resp 16 | Ht 67.0 in | Wt 164.0 lb

## 2024-11-19 DIAGNOSIS — R2689 Other abnormalities of gait and mobility: Secondary | ICD-10-CM | POA: Diagnosis not present

## 2024-11-19 DIAGNOSIS — E538 Deficiency of other specified B group vitamins: Secondary | ICD-10-CM | POA: Diagnosis not present

## 2024-11-19 DIAGNOSIS — D649 Anemia, unspecified: Secondary | ICD-10-CM

## 2024-11-19 DIAGNOSIS — R404 Transient alteration of awareness: Secondary | ICD-10-CM | POA: Diagnosis not present

## 2024-11-19 LAB — IBC + FERRITIN
Ferritin: 74.2 ng/mL (ref 22.0–322.0)
Iron: 153 ug/dL (ref 42–165)
Saturation Ratios: 34.3 % (ref 20.0–50.0)
TIBC: 446.6 ug/dL (ref 250.0–450.0)
Transferrin: 319 mg/dL (ref 212.0–360.0)

## 2024-11-19 LAB — CBC
HCT: 29.5 % — ABNORMAL LOW (ref 39.0–52.0)
Hemoglobin: 9.7 g/dL — ABNORMAL LOW (ref 13.0–17.0)
MCHC: 32.9 g/dL (ref 30.0–36.0)
MCV: 92.9 fl (ref 78.0–100.0)
Platelets: 220 K/uL (ref 150.0–400.0)
RBC: 3.17 Mil/uL — ABNORMAL LOW (ref 4.22–5.81)
RDW: 13.9 % (ref 11.5–15.5)
WBC: 5.5 K/uL (ref 4.0–10.5)

## 2024-11-19 LAB — VITAMIN B12: Vitamin B-12: 323 pg/mL (ref 211–911)

## 2024-11-19 NOTE — Progress Notes (Signed)
 Chief Complaint  Patient presents with   Follow-up    Follow Up    Subjective: Patient is a 73 y.o. male here for ED f/u. Here w wife.   On 11/12/2024, the patient sustained a fall on his right shoulder and broke his clavicle.  He is working with orthopedic surgery team.  He next week, he will return and be reimaged.  If no improvement, surgery will be recommended.  Pain is there, he is taking Tylenol , refused stronger medication.   He does not remember falling.  He has been having issues with his balance.  He is drooling and slurring his speech more.  This is been the case for several weeks.  CT scan in the ER was unremarkable.  Hemoglobin was slightly low compared to his baseline in the ER as well.  He has a history of low vitamin B12 and has been receiving injections.  Past Medical History:  Diagnosis Date   Abdominal aortic atherosclerosis    Anemia    likely of chronic disease   Angina pectoris 08/30/2020   Arthritis    rheumatoid   Atrial fibrillation (HCC)    Atrial fibrillation with rapid ventricular response (HCC) 05/12/2022   06/20/23 - pt states he's not had any A-fib   Barrett's esophagus without dysplasia 03/25/2019   Formatting of this note might be different from the original. EGD done in 02/2019. Due in 3 years.   Benign prostatic hyperplasia with nocturia 12/29/2018   Bipolar 1 disorder (HCC)    Bipolar 1 disorder, mixed, moderate (HCC) 08/16/2014   Bipolar 2 disorder (HCC) 08/16/2014   Bipolar affective disorder, currently depressed, moderate (HCC) 03/08/2020   Bipolar depression (HCC) 08/16/2014   BPH with urinary obstruction 12/29/2018   Brain ischemia 03/08/2020   Cardiac murmur 08/30/2020   Chest pain 09/06/2020   Chicken pox    Chronic bronchitis (HCC)    Chronic kidney disease, stage 3a (HCC) 07/21/2019   Cognitive complaints 03/08/2020   Coronary artery disease involving native coronary artery of native heart 09/06/2020   Coronary artery disease  involving native coronary artery of native heart with angina pectoris 09/06/2020   Depression    Disc degeneration, 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 Last Assessment & Plan:  Well controlled. Continue current medications.   GERD without esophagitis 08/16/2014   Formatting of this note might be different from the original. Last Assessment & Plan:  Well controlled. Continue current medications.   Herpes gingivostomatitis 02/10/2015   Hiatal hernia    High risk medication use 03/23/2018   History of colon polyps 03/25/2019   Formatting of this note might be different from the original. tubular adenoma   History of stroke 11/10/2019   Hyperlipidemia    Hyperphosphatemia 10/30/2022   Hypertension    Ingrowing nail 03/12/2020   Intention tremor 06/16/2019   Labile hypertension 08/16/2014   Formatting of this note might be different from the original. Last Assessment & Plan:  Slightly above goal today secondary to pain. Will continue current regimen. Will check CMP today.   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 Concern over possible lichen planus. His dentist noticed lesions  of his oral mucosa.  It was felt to be lichen planus.  He presents to discuss further.  Rarely does the area hurt.He smoked in the distant past EXAM shows several white patches on the buccal mucosa bilaterally.  No other concerning oral mucosal les   Lithium  use 10/30/2022   Long-term use of immunosuppressant medication 03/23/2018   Low testosterone 09/17/2016   Midline thoracic back pain 02/10/2015   Mixed hyperlipidemia 08/16/2014   Myocarditis (HCC) 05/14/2022    Normochromic normocytic anemia 08/16/2014   Normocytic anemia    likely of chronic disease   PAD (peripheral artery disease) 03/23/2019   Pain of right hip joint 01/19/2016   Pericarditis 05/12/2022   Raynaud's disease    Rheumatic fever    Rheumatoid arthritis (HCC) 10/29/2018   Formatting of this note might be different from the original. Rheum:  Dr. Ziolkowska Formerly on MTX - stopped due to mouth sores. Currently started on Arava .  Also on 5 mg prednisone  x 3 months during run-in.   Right-sided chest wall pain 04/17/2019   Seasonal allergies 03/23/2018   Sepsis (HCC) 05/11/2022   Stasis dermatitis of both legs 07/29/2022   Statin intolerance 07/31/2020   Stroke (HCC)    3 - TIA's   Task-specific dystonia of hand    right hand   Thoracic radiculopathy 06/16/2019   Vitamin B12 deficiency    Injections   Vitamin D  deficiency 09/17/2016    Objective: BP 134/68 (BP Location: Left Arm, Patient Position: Sitting)   Pulse 78   Temp 98 F (36.7 C) (Oral)   Resp 16   Ht 5' 7 (1.702 m)   Wt 164 lb (74.4 kg)   SpO2 97%   BMI 25.69 kg/m  General: Awake, appears stated age Heart: RRR, no LE edema Lungs: CTAB, no rales, wheezes or rhonchi. No accessory muscle use Neuro: Gait is slow and cautious. Psych: Age appropriate judgment and insight, normal affect and mood  Assessment and Plan: Normochromic normocytic anemia - Plan: CBC, IBC + Ferritin  Balance problem - Plan: MR Brain Wo Contrast  Transient alteration of awareness - Plan: MR Brain Wo Contrast  Vitamin B12 deficiency - Plan: B12  Follow-up on anemia today.  Check iron studies and follow-up and B12. MRI to rule out CVA. As above. As above. The patient and his spouse voiced understanding and agreement to the plan.  I spent 30 minutes with the patient discussing the above plans in addition to reviewing his chart on the same day of the visit.  Mabel Mt [place], DO 11/19/2024  12:17 PM

## 2024-11-19 NOTE — Patient Instructions (Signed)
 Give us  2-3 business days to get the results of your labs back.   Someone will reach out to schedule your scan.   Let us  know if you need anything.

## 2024-11-20 ENCOUNTER — Ambulatory Visit: Payer: Self-pay | Admitting: Family Medicine

## 2024-11-23 ENCOUNTER — Ambulatory Visit: Payer: Self-pay

## 2024-11-23 NOTE — Telephone Encounter (Signed)
 Please call pt if able to be seen sooner or if PCP had additional recommendations. Refused earlier appt with a different provider.  Reason for Disposition  Condition / symptoms SAME (not improving)  Answer Assessment - Initial Assessment Questions 1. MAIN CONCERN OR SYMPTOM:  What is your main concern right now? What question do you have? What's the main symptom you're worried about? (e.g., breathing difficulty, ankle swelling, weight gain.)  Pt was advised to call to schedule PCP appt d/t low Hgb. States feels no improvement since being discharged from the ED on 11/20/24. Saw cardiologist today who recommended PCP f/u.  Protocols used: Post-Hospitalization Follow-up Call-A-AH  Copied from CRM #[id number]. Topic: Clinical - Red Word Triage >> Nov 23, 2024  1:08 PM Pinkey ORN wrote: Red Word that prompted transfer to Nurse Triage: Fall >> Nov 23, 2024  1:27 PM Pinkey ORN wrote: Patient disconnected while waiting for NT. Attempted to call patient back, no answer.  >> Nov 23, 2024  1:12 PM Pinkey ORN wrote: Patient states he experienced a fall this past Saturday and was seen at the hospital for it. Patient states he suffered some bruising and swelling in his right shoulder. Patient mentions since being seen at the hospital he has followed up with his heart doctor and his cardiologist is instructing patient to follow up with Dr. Frann in regards to his hemoglobin levels.

## 2024-11-23 NOTE — Telephone Encounter (Signed)
 1130 ideal. I have openings in afternoon it seems.

## 2024-11-23 NOTE — Telephone Encounter (Signed)
 Called pt and he stated has appt Friday.

## 2024-11-23 NOTE — Telephone Encounter (Signed)
 Please advise- is there a time you can work Pt in tomorrow?

## 2024-11-26 ENCOUNTER — Encounter: Payer: Self-pay | Admitting: Family Medicine

## 2024-11-26 ENCOUNTER — Ambulatory Visit: Admitting: Family Medicine

## 2024-11-26 VITALS — BP 132/70 | HR 67 | Temp 98.0°F | Resp 16 | Ht 67.0 in | Wt 156.0 lb

## 2024-11-26 DIAGNOSIS — E538 Deficiency of other specified B group vitamins: Secondary | ICD-10-CM

## 2024-11-26 DIAGNOSIS — D649 Anemia, unspecified: Secondary | ICD-10-CM

## 2024-11-26 NOTE — Progress Notes (Signed)
 Chief Complaint  Patient presents with   Fall    Falls Follow Up    Subjective: Patient is a 73 y.o. male here for f/u. Here w wife.   Pt fell again and went to ED. Scans showed some chronic changes but nothing acute as we were worried about at his last visit. Long term HR monitor showed bradycardia during episodes. Discussing with EP pacemaker vs ICD. He is no longer taking B12 injections. Hb has stayed the same. He is seeing heme team a week from [place]. Fe levels WNL.   Past Medical History:  Diagnosis Date   Abdominal aortic atherosclerosis    Anemia    likely of chronic disease   Angina pectoris 08/30/2020   Arthritis    rheumatoid   Atrial fibrillation (HCC)    Atrial fibrillation with rapid ventricular response (HCC) 05/12/2022   06/20/23 - pt states he's not had any A-fib   Barrett's esophagus without dysplasia 03/25/2019   Formatting of this note might be different from the original. EGD done in 02/2019. Due in 3 years.   Benign prostatic hyperplasia with nocturia 12/29/2018   Bipolar 1 disorder (HCC)    Bipolar 1 disorder, mixed, moderate (HCC) 08/16/2014   Bipolar 2 disorder (HCC) 08/16/2014   Bipolar affective disorder, currently depressed, moderate (HCC) 03/08/2020   Bipolar depression (HCC) 08/16/2014   BPH with urinary obstruction 12/29/2018   Brain ischemia 03/08/2020   Cardiac murmur 08/30/2020   Chest pain 09/06/2020   Chicken pox    Chronic bronchitis (HCC)    Chronic kidney disease, stage 3a (HCC) 07/21/2019   Cognitive complaints 03/08/2020   Coronary artery disease involving native coronary artery of native heart 09/06/2020   Coronary artery disease involving native coronary artery of native heart with angina pectoris 09/06/2020   Depression    Disc degeneration, 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   Drug therapy 05/18/2021   Elevated troponin level not due myocardial infarction 05/11/2022   Essential hypertension    GAD (generalized anxiety disorder) 11/02/2018    Gastroesophageal reflux disease 08/16/2014   Formatting of this note might be different from the original. Last Assessment & Plan:  Well controlled. Continue current medications.   GERD without esophagitis 08/16/2014   Formatting of this note might be different from the original. Last Assessment & Plan:  Well controlled. Continue current medications.   Herpes gingivostomatitis 02/10/2015   Hiatal hernia    High risk medication use 03/23/2018   History of colon polyps 03/25/2019   Formatting of this note might be different from the original. tubular adenoma   History of stroke 11/10/2019   Hyperlipidemia    Hyperphosphatemia 10/30/2022   Hypertension    Ingrowing nail 03/12/2020   Intention tremor 06/16/2019   Labile hypertension 08/16/2014   Formatting of this note might be different from the original. Last Assessment & Plan:  Slightly above goal today secondary to pain. Will continue current regimen. Will check CMP today.   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 Concern over possible lichen planus. His dentist noticed lesions of his oral mucosa.  It was felt to be lichen planus.  He presents to discuss further.  Rarely does the area hurt.He smoked in the distant past EXAM shows several white patches on the buccal mucosa bilaterally.  No other concerning oral mucosal les   Lithium  use 10/30/2022   Long-term use of immunosuppressant medication 03/23/2018  Low testosterone 09/17/2016   Midline thoracic back pain 02/10/2015   Mixed hyperlipidemia 08/16/2014   Myocarditis (HCC) 05/14/2022   Normochromic normocytic anemia 08/16/2014   Normocytic anemia    likely of chronic disease   PAD (peripheral artery disease) 03/23/2019   Pain of right hip joint 01/19/2016   Pericarditis 05/12/2022   Raynaud's disease    Rheumatic fever    Rheumatoid arthritis (HCC) 10/29/2018   Formatting of  this note might be different from the original. Rheum:  Dr. Ziolkowska Formerly on MTX - stopped due to mouth sores. Currently started on Arava .  Also on 5 mg prednisone  x 3 months during run-in.   Right-sided chest wall pain 04/17/2019   Seasonal allergies 03/23/2018   Sepsis (HCC) 05/11/2022   Stasis dermatitis of both legs 07/29/2022   Statin intolerance 07/31/2020   Stroke (HCC)    3 - TIA's   Task-specific dystonia of hand    right hand   Thoracic radiculopathy 06/16/2019   Vitamin B12 deficiency    Injections   Vitamin D  deficiency 09/17/2016    Objective: BP 132/70 (BP Location: Left Arm, Patient Position: Sitting)   Pulse 67   Temp 98 F (36.7 C) (Oral)   Resp 16   Ht 5' 7 (1.702 m)   Wt 156 lb (70.8 kg)   SpO2 97%   BMI 24.43 kg/m  General: Awake, appears stated age Lungs: No accessory muscle use Psych: Age appropriate judgment and insight, normal affect and mood  Assessment and Plan: Normochromic normocytic anemia  B12 deficiency  Probably has anemia of chronic disease. Appreciate hematology input. Doubt this is the cause of the passing out as he is bradying down during episodes.  Normalized without improvement of Hb. Will have him take 1000 mcg/d. IF ab neg.  F/u as originally scheduled.  The patient and his spouse voiced understanding and agreement to the plan.  Mabel Mt [place], DO 11/26/2024  12:02 PM

## 2024-11-26 NOTE — Patient Instructions (Signed)
Stay hydrated.  Let us know if you need anything.  

## 2024-11-29 ENCOUNTER — Other Ambulatory Visit

## 2024-12-06 ENCOUNTER — Ambulatory Visit: Admitting: Orthopedic Surgery

## 2024-12-16 ENCOUNTER — Other Ambulatory Visit: Payer: Self-pay

## 2024-12-16 ENCOUNTER — Ambulatory Visit: Attending: Family Medicine | Admitting: Physical Therapy

## 2024-12-16 ENCOUNTER — Encounter: Payer: Self-pay | Admitting: Physical Therapy

## 2024-12-16 DIAGNOSIS — M25511 Pain in right shoulder: Secondary | ICD-10-CM

## 2024-12-16 DIAGNOSIS — S42031D Displaced fracture of lateral end of right clavicle, subsequent encounter for fracture with routine healing: Secondary | ICD-10-CM | POA: Insufficient documentation

## 2024-12-16 DIAGNOSIS — M25611 Stiffness of right shoulder, not elsewhere classified: Secondary | ICD-10-CM

## 2024-12-16 DIAGNOSIS — R293 Abnormal posture: Secondary | ICD-10-CM

## 2024-12-16 NOTE — Therapy (Signed)
 " OUTPATIENT PHYSICAL THERAPY SHOULDER EVALUATION   Patient Name: Noah Grant MRN: [id number] DOB:11-05-1951, 74 y.o., male Today's Date: 12/16/2024  END OF SESSION:  PT End of Session - 12/16/24 1353     Visit Number 1    Number of Visits 13    Date for Recertification  01/27/25    Authorization Type HTA    PT Start Time 1357    PT Stop Time 1435    PT Time Calculation (min) 38 min          Past Medical History:  Diagnosis Date   Abdominal aortic atherosclerosis    Anemia    likely of chronic disease   Angina pectoris 08/30/2020   Arthritis    rheumatoid   Atrial fibrillation (HCC)    Atrial fibrillation with rapid ventricular response (HCC) 05/12/2022   06/20/23 - pt states he's not had any A-fib   Barrett's esophagus without dysplasia 03/25/2019   Formatting of this note might be different from the original. EGD done in 02/2019. Due in 3 years.   Benign prostatic hyperplasia with nocturia 12/29/2018   Bipolar 1 disorder (HCC)    Bipolar 1 disorder, mixed, moderate (HCC) 08/16/2014   Bipolar 2 disorder (HCC) 08/16/2014   Bipolar affective disorder, currently depressed, moderate (HCC) 03/08/2020   Bipolar depression (HCC) 08/16/2014   BPH with urinary obstruction 12/29/2018   Brain ischemia 03/08/2020   Cardiac murmur 08/30/2020   Chest pain 09/06/2020   Chicken pox    Chronic bronchitis (HCC)    Chronic kidney disease, stage 3a (HCC) 07/21/2019   Cognitive complaints 03/08/2020   Coronary artery disease involving native coronary artery of native heart 09/06/2020   Coronary artery disease involving native coronary artery of native heart with angina pectoris 09/06/2020   Depression    Disc degeneration, 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   Drug therapy 05/18/2021   Elevated troponin level not due myocardial infarction 05/11/2022   Essential hypertension    GAD (generalized anxiety disorder) 11/02/2018   Gastroesophageal reflux disease 08/16/2014   Formatting of this note  might be different from the original. Last Assessment & Plan:  Well controlled. Continue current medications.   GERD without esophagitis 08/16/2014   Formatting of this note might be different from the original. Last Assessment & Plan:  Well controlled. Continue current medications.   Herpes gingivostomatitis 02/10/2015   Hiatal hernia    High risk medication use 03/23/2018   History of colon polyps 03/25/2019   Formatting of this note might be different from the original. tubular adenoma   History of stroke 11/10/2019   Hyperlipidemia    Hyperphosphatemia 10/30/2022   Hypertension    Ingrowing nail 03/12/2020   Intention tremor 06/16/2019   Labile hypertension 08/16/2014   Formatting of this note might be different from the original. Last Assessment & Plan:  Slightly above goal today secondary to pain. Will continue current regimen. Will check CMP today.   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 Concern over possible lichen planus. His dentist noticed lesions of his oral mucosa.  It was felt to be lichen planus.  He presents to discuss further.  Rarely does the area hurt.He smoked in the distant past EXAM shows several white patches on the buccal mucosa bilaterally.  No other concerning oral mucosal les   Lithium  use 10/30/2022   Long-term use of immunosuppressant medication 03/23/2018   Low  testosterone 09/17/2016   Midline thoracic back pain 02/10/2015   Mixed hyperlipidemia 08/16/2014   Myocarditis (HCC) 05/14/2022   Normochromic normocytic anemia 08/16/2014   Normocytic anemia    likely of chronic disease   PAD (peripheral artery disease) 03/23/2019   Pain of right hip joint 01/19/2016   Pericarditis 05/12/2022   Raynaud's disease    Rheumatic fever    Rheumatoid arthritis (HCC) 10/29/2018   Formatting of this note might be different from the original. Rheum:  Dr. Ziolkowska  Formerly on MTX - stopped due to mouth sores. Currently started on Arava .  Also on 5 mg prednisone  x 3 months during run-in.   Right-sided chest wall pain 04/17/2019   Seasonal allergies 03/23/2018   Sepsis (HCC) 05/11/2022   Stasis dermatitis of both legs 07/29/2022   Statin intolerance 07/31/2020   Stroke (HCC)    3 - TIA's   Task-specific dystonia of hand    right hand   Thoracic radiculopathy 06/16/2019   Vitamin B12 deficiency    Injections   Vitamin D  deficiency 09/17/2016   Past Surgical History:  Procedure Laterality Date   CLAVICLE SURGERY     Right, Hardware placed   CORONARY STENT INTERVENTION N/A 09/06/2020   Procedure: CORONARY STENT INTERVENTION;  Surgeon: Wonda Sharper, MD;  Location: [facility] INVASIVE CV LAB;  Service: Cardiovascular;  Laterality: N/A;   FALSE ANEURYSM REPAIR Right 06/23/2023   Procedure: RIGHT ULNAR ARTERY PSEUDOANEURYSM REPAIR;  Surgeon: Lanis Fonda BRAVO, MD;  Location: [facility] OR;  Service: Vascular;  Laterality: Right;   ingrown toenail Bilateral    great toes   LEFT HEART CATH AND CORONARY ANGIOGRAPHY N/A 09/06/2020   Procedure: LEFT HEART CATH AND CORONARY ANGIOGRAPHY;  Surgeon: Wonda Sharper, MD;  Location: [facility] INVASIVE CV LAB;  Service: Cardiovascular;  Laterality: N/A;   LEFT HEART CATH AND CORONARY ANGIOGRAPHY N/A 06/06/2023   Procedure: LEFT HEART CATH AND CORONARY ANGIOGRAPHY;  Surgeon: Anner Alm ORN, MD;  Location: [facility] INVASIVE CV LAB;  Service: Cardiovascular;  Laterality: N/A;   WISDOM TOOTH EXTRACTION     Patient Active Problem List   Diagnosis Date Noted   Gait instability 10/06/2024   Dehydration 06/03/2024   Chronic fatigue 12/21/2023   Trigger finger, left middle finger 09/30/2023   Pseudoaneurysm 06/23/2023   Hyperphosphatemia 10/30/2022   Lithium  use 10/30/2022   Anemia 09/02/2022   Atrial fibrillation (HCC) 09/02/2022   Stasis dermatitis of both legs 07/29/2022   Myocarditis (HCC) 05/14/2022   Pericarditis 05/12/2022    Atrial fibrillation with rapid ventricular response (HCC) 05/12/2022   Sepsis (HCC) 05/11/2022   Elevated troponin level not due myocardial infarction 05/11/2022   Lactic acidosis 05/11/2022   Drug therapy 05/18/2021   Normocytic anemia    Arthritis    Bipolar 1 disorder (HCC)    Chicken pox    Chronic bronchitis (HCC)    Depression    Hyperlipidemia with target low density lipoprotein (LDL) cholesterol less than 55 mg/dL    Rheumatic fever    Essential hypertension    Chest pain 09/06/2020   Coronary artery disease involving native coronary artery of native heart with angina pectoris 09/06/2020   Angina pectoris 08/30/2020   Cardiac murmur 08/30/2020   Statin intolerance 07/31/2020   Abdominal aortic atherosclerosis    Ingrowing nail 03/12/2020   Bipolar affective disorder, currently depressed, moderate (HCC) 03/08/2020   Brain ischemia 03/08/2020   Cognitive complaints 03/08/2020   History of stroke 11/10/2019   Chronic kidney disease, stage 3a (HCC)  07/21/2019   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   Thoracic radiculopathy 06/16/2019   Intention tremor 06/16/2019   Right-sided chest wall pain 04/17/2019   Barrett's esophagus without dysplasia 03/25/2019   History of colon polyps 03/25/2019   PAD (peripheral artery disease) 03/23/2019   Benign prostatic hyperplasia with nocturia 12/29/2018   BPH with urinary obstruction 12/29/2018   GAD (generalized anxiety disorder) 11/02/2018   Rheumatoid arthritis (HCC) 10/29/2018   Lichen planus 04/07/2018   High risk medication use 03/23/2018   Seasonal allergies 03/23/2018   Long-term use of immunosuppressant medication 03/23/2018   Low testosterone 09/17/2016   Vitamin B12 deficiency 09/17/2016   Vitamin D  deficiency 09/17/2016   Disc degeneration, 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   Pain of right hip joint 01/19/2016   Herpes gingivostomatitis 02/10/2015   Midline thoracic back pain 02/10/2015   Normochromic normocytic anemia 08/16/2014    Bipolar 1 disorder, mixed, moderate (HCC) 08/16/2014   Gastroesophageal reflux disease 08/16/2014   Bipolar depression (HCC) 08/16/2014   GERD without esophagitis 08/16/2014   Bipolar 2 disorder (HCC) 08/16/2014    PCP: Frann Mabel Mt, DO  REFERRING PROVIDER: Sherida Adine BROCKS, MD  REFERRING DIAG: [phone] (ICD-10-CM) - Displaced fracture of lateral end of right clavicle, subsequent encounter for fracture with routine healing  THERAPY DIAG:  Right shoulder pain, unspecified chronicity  Stiffness of right shoulder, not elsewhere classified  Abnormal posture  Rationale for Evaluation and Treatment: Rehabilitation  ONSET DATE: December 5th   SUBJECTIVE:                                                                                                                                                                                      SUBJECTIVE STATEMENT: Pt states over the past year or so he has developed balance/dizziness issues for which he has undergone significant work up. This includes episodes of syncope for which he states he is getting an ICD/pacemaker on February 19th. During one such episode on December 5th he states he fell and fractured his R clavicle, which he had previously fractured and required surgical repair. States this incident has been managed conservatively and seems to be getting better with time. He states he was just released from sling and told he could start to use his arm some, not allowed to lift but was cleared to use RW and cane per pt report. States initially he was requiring assist from spouse for ADLs but now can manage them better on his own, still has some limitation. Spouse currently doing majority of housework, usually split. He states he has been given HEP including wall slides.  Denies N/T or swelling at this point. Hx of intention  tremor over the past year or so per pt report. Baseline dizziness that sometimes worsens with his syncopal  episodes. Reports some baseline pain/mobility issues with RA but states overall his shoulder was doing well before fall.  Hand dominance: Right  PERTINENT HISTORY: afib RVR, anemia, bipolar 1/2, chest pain, CKD3, CAD, HTN, intention tremor, myocarditis, pericarditis, RA, hx multiple TIAs, GAD  PAIN:  Are you having pain: none Location/description: R shoulder Best-worst over past week: 0-4/10  - aggravating factors: sleeping (side sleeper), movement - Easing factors: ice pack, changing positions   PRECAUTIONS: R shoulder (per referral, P/AAROM only); fall risk; bradycardia/syncope  RED FLAGS: None   WEIGHT BEARING RESTRICTIONS: pt states he has been cleared to use AD with RUE   FALLS:  Has patient fallen in last 6 months? Yes. Number of falls 2 falls  LIVING ENVIRONMENT: Lives w/ spouse Pt states typically housework split, now wife doing more  OCCUPATION: Retired, freight forwarder business  PLOF: Independent - has been using cane for past year due to chronic balance issues  PATIENT GOALS: get mobility back in the arm, be able to use arm more  NEXT MD VISIT: February 4th  OBJECTIVE:  Note: Objective measures were completed at Evaluation unless otherwise noted.  DIAGNOSTIC FINDINGS:  11/12/24 R shoulder XR: IMPRESSION: 1. Acute distal right clavicular fracture, distal to the lateral margin of the previous plate and screw fixation.  11/12/24 CTH/neck without acute changes, please refer to EPIC for details  PATIENT SURVEYS:  Patient-specific activity scoring scheme (Point to one number):  0 represents unable to perform. 10 represents able to perform at prior level. 0 1 2 3 4 5 6 7 8 9  10 (Date and Score) Activity Initial  Activity Eval     Getting shirt on and off 5     Brushing teeth 6     Pulling up pants 4     Score: 15 total, 5 avg  COGNITION: Overall cognitive status: Within functional limits for tasks assessed     SENSATION: LT  intact BIL Intention tremor evident RUE, consistent with pt hx  POSTURE: Kyphosis, fwd head  UPPER EXTREMITY ROM:  A/PROM Right eval Left eval  Shoulder flexion P: ~80 deg s  AA (table slides) 92 deg s A: 125 s  Shoulder abduction P: ~65 deg s A; 102 deg  Shoulder internal rotation    Shoulder external rotation    Elbow flexion    Elbow extension    Wrist flexion    Wrist extension     (Blank rows = not tested) (Key: WFL = within functional limits not formally assessed, * = concordant pain, s = stiffness/stretching sensation, NT = not tested)  Comments:    UPPER EXTREMITY MMT:  MMT Right eval Left eval  Shoulder flexion    Shoulder extension    Shoulder abduction    Shoulder extension    Shoulder internal rotation    Shoulder external rotation    Elbow flexion    Elbow extension    Grip strength    (Blank rows = not tested)  (Key: WFL = within functional limits not formally assessed, * = concordant pain, s = stiffness/stretching sensation, NT = not tested)  Comments: deferred given restrictions    PALPATION:  Concordant TTP R distal clavicle, no significant muscular TTP  TREATMENT:  [facility] Adult PT Treatment:                                                DATE: 12/16/24 Therapeutic Exercise: Table slides AAROM flexion x8 practice reps, cues to avoid WB, maintain comfortable ROM Table slides scaption AAROM x8  Elbow AROM x8 Scapular retraction to neutral (kyphotic resting posture) x8 HEP handout + education  Self Care: Education/discussion re: exam findings as they relate to symptom behavior, PT goals/POC, safety w/ mobility and activity, activity modification as indicated, discussed when using cane to utilize in LUE given safety concerns w/ relying solely on RUE   PATIENT EDUCATION: Education details: Pt education on PT impairments,  prognosis, and POC. Informed consent. Rationale for interventions, safe/appropriate HEP performance Person educated: Patient Education method: Explanation, Demonstration, Tactile cues, Verbal cues Education comprehension: verbalized understanding, returned demonstration, verbal cues required, tactile cues required, and needs further education    HOME EXERCISE PROGRAM: Access Code: [email or web address] URL: [email or web address] Date: 12/16/2024 Prepared by: Alm Jenny  Exercises - Seated Shoulder Flexion Towel Slide at Table Top  - 2-3 x daily - 1 sets - 8-12 reps - Seated Elbow Flexion and Extension AROM  - 2-3 x daily - 1 sets - 8-12 reps - Seated Scapular Retraction  - 2-3 x daily - 1 sets - 8-12 reps  ASSESSMENT:  CLINICAL IMPRESSION: Patient is a pleasant 74 y.o. gentleman who was seen today for physical therapy evaluation and treatment for R clavicular fracture. Per paper referral, P/AAROM only although pt states yesterday he was cleared from sling and was told he can use RUE for WB through RW/cane. On exam he demonstrates expected limitations in [facility] mobility, limited primarily by stiffness rather than pain. Tolerates HEP/exam well without increase in resting pain or adverse event. Self care education as above with emphasis on safe/comfortable mobility, safety w/ AD use, activity modification. Recommend trial of skilled PT to address aforementioned deficits with aim of improving functional tolerance and reducing pain with typical activities. Pt departs today's session in no acute distress, all voiced concerns/questions addressed appropriately from PT perspective.      OBJECTIVE IMPAIRMENTS: decreased activity tolerance, decreased endurance, decreased mobility, decreased ROM, decreased strength, impaired perceived functional ability, impaired UE functional use, postural dysfunction, and pain.   ACTIVITY LIMITATIONS: carrying, lifting, sleeping, bathing, toileting, dressing, self  feeding, reach over head, and hygiene/grooming  PARTICIPATION LIMITATIONS: meal prep, cleaning, laundry, and community activity  PERSONAL FACTORS: Age, Time since onset of injury/illness/exacerbation, and 3+ comorbidities: afib RVR, anemia, bipolar 1/2, chest pain, CKD3, CAD, HTN, intention tremor, myocarditis, pericarditis, RA, hx multiple TIAs, GAD are also affecting patient's functional outcome.   REHAB POTENTIAL: Fair given comorbidities  CLINICAL DECISION MAKING: Evolving/moderate complexity  EVALUATION COMPLEXITY: Moderate   GOALS:  SHORT TERM GOALS: Target date: 01/06/2025 Pt will demonstrate appropriate understanding and performance of initially prescribed HEP in order to facilitate improved independence with management of symptoms.  Baseline: HEP established  Goal status: INITIAL   2. Pt will report at least 25% improvement in overall pain levels over past week in order to facilitate improved tolerance to typical daily activities.   Baseline: 0-4/10  Goal status: INITIAL    LONG TERM GOALS: Target date: 01/27/2025 Pt will score greater than or equal to 8/10 avg on PSFS to indicate   Baseline: 5 avg  Goal status: INITIAL  2.  Pt will demonstrate at least 120 degrees of active shoulder elevation in order to demonstrate improved capacity for reaching activities.  Baseline: see ROM chart above - P/AAROM only on eval per referral Goal status: INITIAL  3.  Pt will report at least 50% decrease in overall pain levels in past week in order to facilitate improved tolerance to basic ADLs/mobility.   Baseline: 0-4/10  Goal status: INITIAL    4. Pt will report ability to perform usual dressing tasks (upper and lower body) without pain/limitation to improve tolerance to ADLs.   Baseline: reports moderate difficulty with dressing tasks  Goal status: INITIAL   PLAN:  PT FREQUENCY: 1-2x/week  PT DURATION: 6 weeks  PLANNED INTERVENTIONS: [id number]- PT Re-evaluation, 97750- Physical  Performance Testing, 97110-Therapeutic exercises, 97530- Therapeutic activity, [id number]- Neuromuscular re-education, 97535- Self Care, [id number]- Manual therapy, Patient/Family education, Taping, Joint mobilization, Cryotherapy, and Moist heat  PLAN FOR NEXT SESSION: Review/update HEP PRN. Work on [employer] exercises as appropriate with emphasis on R GH P/AAROM within tolerance, posture. Symptom modification strategies as indicated/appropriate. Mindful of extensive medical hx including syncopal episodes of cardiac origin, plan for upcoming ICD/pacemaker placement in February per pt.    Alm DELENA Jenny PT, DPT 12/16/2024 4:11 PM  "

## 2024-12-20 ENCOUNTER — Ambulatory Visit: Admitting: Physical Therapy

## 2024-12-20 ENCOUNTER — Encounter: Payer: Self-pay | Admitting: Physical Therapy

## 2024-12-20 DIAGNOSIS — R293 Abnormal posture: Secondary | ICD-10-CM

## 2024-12-20 DIAGNOSIS — M25611 Stiffness of right shoulder, not elsewhere classified: Secondary | ICD-10-CM

## 2024-12-20 DIAGNOSIS — M25511 Pain in right shoulder: Secondary | ICD-10-CM

## 2024-12-20 DIAGNOSIS — S42031D Displaced fracture of lateral end of right clavicle, subsequent encounter for fracture with routine healing: Secondary | ICD-10-CM | POA: Diagnosis not present

## 2024-12-20 NOTE — Therapy (Signed)
 " OUTPATIENT PHYSICAL THERAPY TREATMENT   Patient Name: Noah Grant MRN: [id number] DOB:[birth year], 74 y.o., male Today's Date: 12/20/2024  END OF SESSION:  PT End of Session - 12/20/24 1401     Visit Number 2    Number of Visits 13    Date for Recertification  01/27/25    Authorization Type HTA    PT Start Time 1402    PT Stop Time 1442    PT Time Calculation (min) 40 min           Past Medical History:  Diagnosis Date   Abdominal aortic atherosclerosis    Anemia    likely of chronic disease   Angina pectoris 08/30/2020   Arthritis    rheumatoid   Atrial fibrillation (HCC)    Atrial fibrillation with rapid ventricular response (HCC) 05/12/2022   06/20/23 - pt states he's not had any A-fib   Barrett's esophagus without dysplasia 03/25/2019   Formatting of this note might be different from the original. EGD done in 02/2019. Due in 3 years.   Benign prostatic hyperplasia with nocturia 12/29/2018   Bipolar 1 disorder (HCC)    Bipolar 1 disorder, mixed, moderate (HCC) 08/16/2014   Bipolar 2 disorder (HCC) 08/16/2014   Bipolar affective disorder, currently depressed, moderate (HCC) 03/08/2020   Bipolar depression (HCC) 08/16/2014   BPH with urinary obstruction 12/29/2018   Brain ischemia 03/08/2020   Cardiac murmur 08/30/2020   Chest pain 09/06/2020   Chicken pox    Chronic bronchitis (HCC)    Chronic kidney disease, stage 3a (HCC) 07/21/2019   Cognitive complaints 03/08/2020   Coronary artery disease involving native coronary artery of native heart 09/06/2020   Coronary artery disease involving native coronary artery of native heart with angina pectoris 09/06/2020   Depression    Disc degeneration, 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   Drug therapy 05/18/2021   Elevated troponin level not due myocardial infarction 05/11/2022   Essential hypertension    GAD (generalized anxiety disorder) 11/02/2018   Gastroesophageal reflux disease 08/16/2014   Formatting of this note might be  different from the original. Last Assessment & Plan:  Well controlled. Continue current medications.   GERD without esophagitis 08/16/2014   Formatting of this note might be different from the original. Last Assessment & Plan:  Well controlled. Continue current medications.   Herpes gingivostomatitis 02/10/2015   Hiatal hernia    High risk medication use 03/23/2018   History of colon polyps 03/25/2019   Formatting of this note might be different from the original. tubular adenoma   History of stroke 11/10/2019   Hyperlipidemia    Hyperphosphatemia 10/30/2022   Hypertension    Ingrowing nail 03/12/2020   Intention tremor 06/16/2019   Labile hypertension 08/16/2014   Formatting of this note might be different from the original. Last Assessment & Plan:  Slightly above goal today secondary to pain. Will continue current regimen. Will check CMP today.   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 Concern over possible lichen planus. His dentist noticed lesions of his oral mucosa.  It was felt to be lichen planus.  He presents to discuss further.  Rarely does the area hurt.He smoked in the distant past EXAM shows several white patches on the buccal mucosa bilaterally.  No other concerning oral mucosal les   Lithium  use 10/30/2022   Long-term use of immunosuppressant medication 03/23/2018   Low  testosterone 09/17/2016   Midline thoracic back pain 02/10/2015   Mixed hyperlipidemia 08/16/2014   Myocarditis (HCC) 05/14/2022   Normochromic normocytic anemia 08/16/2014   Normocytic anemia    likely of chronic disease   PAD (peripheral artery disease) 03/23/2019   Pain of right hip joint 01/19/2016   Pericarditis 05/12/2022   Raynaud's disease    Rheumatic fever    Rheumatoid arthritis (HCC) 10/29/2018   Formatting of this note might be different from the original. Rheum:  Dr. Ziolkowska Formerly  on MTX - stopped due to mouth sores. Currently started on Arava .  Also on 5 mg prednisone  x 3 months during run-in.   Right-sided chest wall pain 04/17/2019   Seasonal allergies 03/23/2018   Sepsis (HCC) 05/11/2022   Stasis dermatitis of both legs 07/29/2022   Statin intolerance 07/31/2020   Stroke (HCC)    3 - TIA's   Task-specific dystonia of hand    right hand   Thoracic radiculopathy 06/16/2019   Vitamin B12 deficiency    Injections   Vitamin D  deficiency 09/17/2016   Past Surgical History:  Procedure Laterality Date   CLAVICLE SURGERY     Right, Hardware placed   CORONARY STENT INTERVENTION N/A 09/06/2020   Procedure: CORONARY STENT INTERVENTION;  Surgeon: Wonda Sharper, MD;  Location: [facility] INVASIVE CV LAB;  Service: Cardiovascular;  Laterality: N/A;   FALSE ANEURYSM REPAIR Right 06/23/2023   Procedure: RIGHT ULNAR ARTERY PSEUDOANEURYSM REPAIR;  Surgeon: Lanis Fonda BRAVO, MD;  Location: [facility] OR;  Service: Vascular;  Laterality: Right;   ingrown toenail Bilateral    great toes   LEFT HEART CATH AND CORONARY ANGIOGRAPHY N/A 09/06/2020   Procedure: LEFT HEART CATH AND CORONARY ANGIOGRAPHY;  Surgeon: Wonda Sharper, MD;  Location: [facility] INVASIVE CV LAB;  Service: Cardiovascular;  Laterality: N/A;   LEFT HEART CATH AND CORONARY ANGIOGRAPHY N/A 06/06/2023   Procedure: LEFT HEART CATH AND CORONARY ANGIOGRAPHY;  Surgeon: Anner Alm ORN, MD;  Location: [facility] INVASIVE CV LAB;  Service: Cardiovascular;  Laterality: N/A;   WISDOM TOOTH EXTRACTION     Patient Active Problem List   Diagnosis Date Noted   Gait instability 10/06/2024   Dehydration 06/03/2024   Chronic fatigue 12/21/2023   Trigger finger, left middle finger 09/30/2023   Pseudoaneurysm 06/23/2023   Hyperphosphatemia 10/30/2022   Lithium  use 10/30/2022   Anemia 09/02/2022   Atrial fibrillation (HCC) 09/02/2022   Stasis dermatitis of both legs 07/29/2022   Myocarditis (HCC) 05/14/2022   Pericarditis 05/12/2022   Atrial  fibrillation with rapid ventricular response (HCC) 05/12/2022   Sepsis (HCC) 05/11/2022   Elevated troponin level not due myocardial infarction 05/11/2022   Lactic acidosis 05/11/2022   Drug therapy 05/18/2021   Normocytic anemia    Arthritis    Bipolar 1 disorder (HCC)    Chicken pox    Chronic bronchitis (HCC)    Depression    Hyperlipidemia with target low density lipoprotein (LDL) cholesterol less than 55 mg/dL    Rheumatic fever    Essential hypertension    Chest pain 09/06/2020   Coronary artery disease involving native coronary artery of native heart with angina pectoris 09/06/2020   Angina pectoris 08/30/2020   Cardiac murmur 08/30/2020   Statin intolerance 07/31/2020   Abdominal aortic atherosclerosis    Ingrowing nail 03/12/2020   Bipolar affective disorder, currently depressed, moderate (HCC) 03/08/2020   Brain ischemia 03/08/2020   Cognitive complaints 03/08/2020   History of stroke 11/10/2019   Chronic kidney disease, stage 3a (HCC)  07/21/2019   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   Thoracic radiculopathy 06/16/2019   Intention tremor 06/16/2019   Right-sided chest wall pain 04/17/2019   Barrett's esophagus without dysplasia 03/25/2019   History of colon polyps 03/25/2019   PAD (peripheral artery disease) 03/23/2019   Benign prostatic hyperplasia with nocturia 12/29/2018   BPH with urinary obstruction 12/29/2018   GAD (generalized anxiety disorder) 11/02/2018   Rheumatoid arthritis (HCC) 10/29/2018   Lichen planus 04/07/2018   High risk medication use 03/23/2018   Seasonal allergies 03/23/2018   Long-term use of immunosuppressant medication 03/23/2018   Low testosterone 09/17/2016   Vitamin B12 deficiency 09/17/2016   Vitamin D  deficiency 09/17/2016   Disc degeneration, 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   Pain of right hip joint 01/19/2016   Herpes gingivostomatitis 02/10/2015   Midline thoracic back pain 02/10/2015   Normochromic normocytic anemia 08/16/2014    Bipolar 1 disorder, mixed, moderate (HCC) 08/16/2014   Gastroesophageal reflux disease 08/16/2014   Bipolar depression (HCC) 08/16/2014   GERD without esophagitis 08/16/2014   Bipolar 2 disorder (HCC) 08/16/2014    PCP: Frann Mabel Mt, DO  REFERRING PROVIDER: Sherida Adine BROCKS, MD  REFERRING DIAG: [phone] (ICD-10-CM) - Displaced fracture of lateral end of right clavicle, subsequent encounter for fracture with routine healing  THERAPY DIAG:  Right shoulder pain, unspecified chronicity  Stiffness of right shoulder, not elsewhere classified  Abnormal posture  Rationale for Evaluation and Treatment: Rehabilitation  ONSET DATE: December 5th   SUBJECTIVE:                                                                                                                                                                                     Per eval: Pt states over the past year or so he has developed balance/dizziness issues for which he has undergone significant work up. This includes episodes of syncope for which he states he is getting an ICD/pacemaker on February 19th. During one such episode on December 5th he states he fell and fractured his R clavicle, which he had previously fractured and required surgical repair. States this incident has been managed conservatively and seems to be getting better with time. He states he was just released from sling and told he could start to use his arm some, not allowed to lift but was cleared to use RW and cane per pt report. States initially he was requiring assist from spouse for ADLs but now can manage them better on his own, still has some limitation. Spouse currently doing majority of housework, usually split. He states he has been given HEP including wall slides.  Denies N/T or swelling at this point. Hx of intention tremor  over the past year or so per pt report. Baseline dizziness that sometimes worsens with his syncopal episodes. Reports some  baseline pain/mobility issues with RA but states overall his shoulder was doing well before fall.  Hand dominance: Right  SUBJECTIVE STATEMENT: 12/20/2024: states shoulder is doing pretty well overall but is having some anterior shoulder pain, notes at baseline he tends to have consistent shoulder pain d/t RA. States exercises aren't painful, has been trying to use his shoulder a bit more. Also rolling over onto his shoulder when sleeping makes it sore.  PERTINENT HISTORY: afib RVR, anemia, bipolar 1/2, chest pain, CKD3, CAD, HTN, intention tremor, myocarditis, pericarditis, RA, hx multiple TIAs, GAD  PAIN:  Are you having pain: 4/10 anterior R shoulder  Per eval:  Location/description: R shoulder Best-worst over past week: 0-4/10  - aggravating factors: sleeping (side sleeper), movement - Easing factors: ice pack, changing positions   PRECAUTIONS: R shoulder (per referral, P/AAROM only); fall risk; bradycardia/syncope  RED FLAGS: None   WEIGHT BEARING RESTRICTIONS: pt states he has been cleared to use AD with RUE   FALLS:  Has patient fallen in last 6 months? Yes. Number of falls 2 falls  LIVING ENVIRONMENT: Lives w/ spouse Pt states typically housework split, now wife doing more  OCCUPATION: Retired, freight forwarder business  PLOF: Independent - has been using cane for past year due to chronic balance issues  PATIENT GOALS: get mobility back in the arm, be able to use arm more  NEXT MD VISIT: February 4th  OBJECTIVE:  Note: Objective measures were completed at Evaluation unless otherwise noted.  DIAGNOSTIC FINDINGS:  11/12/24 R shoulder XR: IMPRESSION: 1. Acute distal right clavicular fracture, distal to the lateral margin of the previous plate and screw fixation.  11/12/24 CTH/neck without acute changes, please refer to EPIC for details  PATIENT SURVEYS:  Patient-specific activity scoring scheme (Point to one number):  0 represents unable to  perform. 10 represents able to perform at prior level. 0 1 2 3 4 5 6 7 8 9  10 (Date and Score) Activity Initial  Activity Eval     Getting shirt on and off 5     Brushing teeth 6     Pulling up pants 4     Score: 15 total, 5 avg  COGNITION: Overall cognitive status: Within functional limits for tasks assessed     SENSATION: LT intact BIL Intention tremor evident RUE, consistent with pt hx  POSTURE: Kyphosis, fwd head  UPPER EXTREMITY ROM:  A/PROM Right eval Left eval  Shoulder flexion P: ~80 deg s  AA (table slides) 92 deg s A: 125 s  Shoulder abduction P: ~65 deg s A; 102 deg  Shoulder internal rotation    Shoulder external rotation    Elbow flexion    Elbow extension    Wrist flexion    Wrist extension     (Blank rows = not tested) (Key: WFL = within functional limits not formally assessed, * = concordant pain, s = stiffness/stretching sensation, NT = not tested)  Comments:    UPPER EXTREMITY MMT:  MMT Right eval Left eval  Shoulder flexion    Shoulder extension    Shoulder abduction    Shoulder extension    Shoulder internal rotation    Shoulder external rotation    Elbow flexion    Elbow extension    Grip strength    (Blank rows = not tested)  (Key: WFL = within functional limits not formally  assessed, * = concordant pain, s = stiffness/stretching sensation, NT = not tested)  Comments: deferred given restrictions    PALPATION:  Concordant TTP R distal clavicle, no significant muscular TTP                                                                                                                              TREATMENT:   [facility] Adult PT Treatment:                                                DATE: 12/20/24 Therapeutic Exercise: Table slides flexion pball assisted AAROM 3x8 cues for comfortable ROM and minimizing WB Table slides scaption pball assisted AAROM 3x8  Shoulder ER AAROM seated w/ cane, RUE cues for comfortable ROM  and  reduced compensations at elbow 3x8 Scap retraction to neutral (from kyphotic resting posture) x15 HEP discussion/education, encouraging passive/AAROM and strategies to maintain comfortable mobility, indications for regression/progression   Self Care: Education/discussion re: sleep positioning for comfort/safety, safety w/ functional mobility, mitigating RUE AD use (he states it has become irritable)    [facility] Adult PT Treatment:                                                DATE: 12/16/24 Therapeutic Exercise: Table slides AAROM flexion x8 practice reps, cues to avoid WB, maintain comfortable ROM Table slides scaption AAROM x8  Elbow AROM x8 Scapular retraction to neutral (kyphotic resting posture) x8 HEP handout + education  Self Care: Education/discussion re: exam findings as they relate to symptom behavior, PT goals/POC, safety w/ mobility and activity, activity modification as indicated, discussed when using cane to utilize in LUE given safety concerns w/ relying solely on RUE   PATIENT EDUCATION: Education details: rationale for interventions, HEP  Person educated: Patient Education method: Explanation, Demonstration, Tactile cues, Verbal cues Education comprehension: verbalized understanding, returned demonstration, verbal cues required, tactile cues required, and needs further education     HOME EXERCISE PROGRAM: Access Code: [email or web address] URL: [email or web address] Date: 12/16/2024 Prepared by: Alm Jenny  Exercises - Seated Shoulder Flexion Towel Slide at Table Top  - 2-3 x daily - 1 sets - 8-12 reps - Seated Elbow Flexion and Extension AROM  - 2-3 x daily - 1 sets - 8-12 reps - Seated Scapular Retraction  - 2-3 x daily - 1 sets - 8-12 reps  ASSESSMENT:  CLINICAL IMPRESSION: 12/20/2024: Pt arrives w/ 4/10 pain anterior shoulder, no issues after initial eval. Did reach out to referring providers office to clarify ROM/WB restrictions, they state referring  provider will reach back out to our office. Until clarified, we continue to work on P/AAROM per referral which pt does  well with today. Cues for posture and comfortable ROM, no increase in resting pain, no adverse events. Recommend continuing along current POC in order to address relevant deficits and improve functional tolerance. Pt departs today's session in no acute distress, all voiced questions/concerns addressed appropriately from PT perspective.     Per eval: Patient is a pleasant 74 y.o. gentleman who was seen today for physical therapy evaluation and treatment for R clavicular fracture. Per paper referral, P/AAROM only although pt states yesterday he was cleared from sling and was told he can use RUE for WB through RW/cane. On exam he demonstrates expected limitations in [facility] mobility, limited primarily by stiffness rather than pain. Tolerates HEP/exam well without increase in resting pain or adverse event. Self care education as above with emphasis on safe/comfortable mobility, safety w/ AD use, activity modification. Recommend trial of skilled PT to address aforementioned deficits with aim of improving functional tolerance and reducing pain with typical activities. Pt departs today's session in no acute distress, all voiced concerns/questions addressed appropriately from PT perspective.      OBJECTIVE IMPAIRMENTS: decreased activity tolerance, decreased endurance, decreased mobility, decreased ROM, decreased strength, impaired perceived functional ability, impaired UE functional use, postural dysfunction, and pain.   ACTIVITY LIMITATIONS: carrying, lifting, sleeping, bathing, toileting, dressing, self feeding, reach over head, and hygiene/grooming  PARTICIPATION LIMITATIONS: meal prep, cleaning, laundry, and community activity  PERSONAL FACTORS: Age, Time since onset of injury/illness/exacerbation, and 3+ comorbidities: afib RVR, anemia, bipolar 1/2, chest pain, CKD3, CAD, HTN, intention tremor,  myocarditis, pericarditis, RA, hx multiple TIAs, GAD are also affecting patient's functional outcome.   REHAB POTENTIAL: Fair given comorbidities  CLINICAL DECISION MAKING: Evolving/moderate complexity  EVALUATION COMPLEXITY: Moderate   GOALS:  SHORT TERM GOALS: Target date: 01/06/2025 Pt will demonstrate appropriate understanding and performance of initially prescribed HEP in order to facilitate improved independence with management of symptoms.  Baseline: HEP established  Goal status: INITIAL   2. Pt will report at least 25% improvement in overall pain levels over past week in order to facilitate improved tolerance to typical daily activities.   Baseline: 0-4/10  Goal status: INITIAL    LONG TERM GOALS: Target date: 01/27/2025 Pt will score greater than or equal to 8/10 avg on PSFS to indicate   Baseline: 5 avg   Goal status: INITIAL  2.  Pt will demonstrate at least 120 degrees of active shoulder elevation in order to demonstrate improved capacity for reaching activities.  Baseline: see ROM chart above - P/AAROM only on eval per referral Goal status: INITIAL  3.  Pt will report at least 50% decrease in overall pain levels in past week in order to facilitate improved tolerance to basic ADLs/mobility.   Baseline: 0-4/10  Goal status: INITIAL    4. Pt will report ability to perform usual dressing tasks (upper and lower body) without pain/limitation to improve tolerance to ADLs.   Baseline: reports moderate difficulty with dressing tasks  Goal status: INITIAL   PLAN:  PT FREQUENCY: 1-2x/week  PT DURATION: 6 weeks  PLANNED INTERVENTIONS: [id number]- PT Re-evaluation, 97750- Physical Performance Testing, 97110-Therapeutic exercises, 97530- Therapeutic activity, [id number]- Neuromuscular re-education, 97535- Self Care, [id number]- Manual therapy, Patient/Family education, Taping, Joint mobilization, Cryotherapy, and Moist heat  PLAN FOR NEXT SESSION: Review/update HEP PRN. Work on  [employer] exercises as appropriate with emphasis on R GH P/AAROM within tolerance, posture. Symptom modification strategies as indicated/appropriate. Mindful of extensive medical hx including syncopal episodes of cardiac origin, plan for upcoming ICD/pacemaker placement in  February per pt.    Alm DELENA Jenny PT, DPT 12/20/2024 2:45 PM  "

## 2024-12-22 ENCOUNTER — Ambulatory Visit

## 2024-12-22 DIAGNOSIS — M25551 Pain in right hip: Secondary | ICD-10-CM

## 2024-12-22 DIAGNOSIS — S42031D Displaced fracture of lateral end of right clavicle, subsequent encounter for fracture with routine healing: Secondary | ICD-10-CM | POA: Diagnosis not present

## 2024-12-22 DIAGNOSIS — R293 Abnormal posture: Secondary | ICD-10-CM

## 2024-12-22 DIAGNOSIS — M6281 Muscle weakness (generalized): Secondary | ICD-10-CM

## 2024-12-22 DIAGNOSIS — M25511 Pain in right shoulder: Secondary | ICD-10-CM

## 2024-12-22 DIAGNOSIS — R2681 Unsteadiness on feet: Secondary | ICD-10-CM

## 2024-12-22 DIAGNOSIS — R2689 Other abnormalities of gait and mobility: Secondary | ICD-10-CM

## 2024-12-22 DIAGNOSIS — M25611 Stiffness of right shoulder, not elsewhere classified: Secondary | ICD-10-CM

## 2024-12-22 NOTE — Therapy (Signed)
 " OUTPATIENT PHYSICAL THERAPY TREATMENT   Patient Name: Noah Grant MRN: [id number] DOB:[birth year], 74 y.o., male Today's Date: 12/22/2024  END OF SESSION:  PT End of Session - 12/22/24 1453     Visit Number 3    Number of Visits 13    Date for Recertification  01/27/25    Authorization Type HTA    PT Start Time 1453    PT Stop Time 1533    PT Time Calculation (min) 40 min    Activity Tolerance Patient tolerated treatment well    Behavior During Therapy [facility] for tasks assessed/performed         Past Medical History:  Diagnosis Date   Abdominal aortic atherosclerosis    Anemia    likely of chronic disease   Angina pectoris 08/30/2020   Arthritis    rheumatoid   Atrial fibrillation (HCC)    Atrial fibrillation with rapid ventricular response (HCC) 05/12/2022   06/20/23 - pt states he's not had any A-fib   Barrett's esophagus without dysplasia 03/25/2019   Formatting of this note might be different from the original. EGD done in 02/2019. Due in 3 years.   Benign prostatic hyperplasia with nocturia 12/29/2018   Bipolar 1 disorder (HCC)    Bipolar 1 disorder, mixed, moderate (HCC) 08/16/2014   Bipolar 2 disorder (HCC) 08/16/2014   Bipolar affective disorder, currently depressed, moderate (HCC) 03/08/2020   Bipolar depression (HCC) 08/16/2014   BPH with urinary obstruction 12/29/2018   Brain ischemia 03/08/2020   Cardiac murmur 08/30/2020   Chest pain 09/06/2020   Chicken pox    Chronic bronchitis (HCC)    Chronic kidney disease, stage 3a (HCC) 07/21/2019   Cognitive complaints 03/08/2020   Coronary artery disease involving native coronary artery of native heart 09/06/2020   Coronary artery disease involving native coronary artery of native heart with angina pectoris 09/06/2020   Depression    Disc degeneration, 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   Drug therapy 05/18/2021   Elevated troponin level not due myocardial infarction 05/11/2022   Essential hypertension    GAD (generalized  anxiety disorder) 11/02/2018   Gastroesophageal reflux disease 08/16/2014   Formatting of this note might be different from the original. Last Assessment & Plan:  Well controlled. Continue current medications.   GERD without esophagitis 08/16/2014   Formatting of this note might be different from the original. Last Assessment & Plan:  Well controlled. Continue current medications.   Herpes gingivostomatitis 02/10/2015   Hiatal hernia    High risk medication use 03/23/2018   History of colon polyps 03/25/2019   Formatting of this note might be different from the original. tubular adenoma   History of stroke 11/10/2019   Hyperlipidemia    Hyperphosphatemia 10/30/2022   Hypertension    Ingrowing nail 03/12/2020   Intention tremor 06/16/2019   Labile hypertension 08/16/2014   Formatting of this note might be different from the original. Last Assessment & Plan:  Slightly above goal today secondary to pain. Will continue current regimen. Will check CMP today.   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 Concern over possible lichen planus. His dentist noticed lesions of his oral mucosa.  It was felt to be lichen planus.  He presents to discuss further.  Rarely does the area hurt.He smoked in the distant past EXAM shows several white patches on the buccal mucosa bilaterally.  No other concerning oral mucosal  les   Lithium  use 10/30/2022   Long-term use of immunosuppressant medication 03/23/2018   Low testosterone 09/17/2016   Midline thoracic back pain 02/10/2015   Mixed hyperlipidemia 08/16/2014   Myocarditis (HCC) 05/14/2022   Normochromic normocytic anemia 08/16/2014   Normocytic anemia    likely of chronic disease   PAD (peripheral artery disease) 03/23/2019   Pain of right hip joint 01/19/2016   Pericarditis 05/12/2022   Raynaud's disease    Rheumatic fever    Rheumatoid arthritis  (HCC) 10/29/2018   Formatting of this note might be different from the original. Rheum:  Dr. Ziolkowska Formerly on MTX - stopped due to mouth sores. Currently started on Arava .  Also on 5 mg prednisone  x 3 months during run-in.   Right-sided chest wall pain 04/17/2019   Seasonal allergies 03/23/2018   Sepsis (HCC) 05/11/2022   Stasis dermatitis of both legs 07/29/2022   Statin intolerance 07/31/2020   Stroke (HCC)    3 - TIA's   Task-specific dystonia of hand    right hand   Thoracic radiculopathy 06/16/2019   Vitamin B12 deficiency    Injections   Vitamin D  deficiency 09/17/2016   Past Surgical History:  Procedure Laterality Date   CLAVICLE SURGERY     Right, Hardware placed   CORONARY STENT INTERVENTION N/A 09/06/2020   Procedure: CORONARY STENT INTERVENTION;  Surgeon: Wonda Sharper, MD;  Location: [facility] INVASIVE CV LAB;  Service: Cardiovascular;  Laterality: N/A;   FALSE ANEURYSM REPAIR Right 06/23/2023   Procedure: RIGHT ULNAR ARTERY PSEUDOANEURYSM REPAIR;  Surgeon: Lanis Fonda BRAVO, MD;  Location: [facility] OR;  Service: Vascular;  Laterality: Right;   ingrown toenail Bilateral    great toes   LEFT HEART CATH AND CORONARY ANGIOGRAPHY N/A 09/06/2020   Procedure: LEFT HEART CATH AND CORONARY ANGIOGRAPHY;  Surgeon: Wonda Sharper, MD;  Location: [facility] INVASIVE CV LAB;  Service: Cardiovascular;  Laterality: N/A;   LEFT HEART CATH AND CORONARY ANGIOGRAPHY N/A 06/06/2023   Procedure: LEFT HEART CATH AND CORONARY ANGIOGRAPHY;  Surgeon: Anner Alm ORN, MD;  Location: [facility] INVASIVE CV LAB;  Service: Cardiovascular;  Laterality: N/A;   WISDOM TOOTH EXTRACTION     Patient Active Problem List   Diagnosis Date Noted   Gait instability 10/06/2024   Dehydration 06/03/2024   Chronic fatigue 12/21/2023   Trigger finger, left middle finger 09/30/2023   Pseudoaneurysm 06/23/2023   Hyperphosphatemia 10/30/2022   Lithium  use 10/30/2022   Anemia 09/02/2022   Atrial fibrillation (HCC) 09/02/2022    Stasis dermatitis of both legs 07/29/2022   Myocarditis (HCC) 05/14/2022   Pericarditis 05/12/2022   Atrial fibrillation with rapid ventricular response (HCC) 05/12/2022   Sepsis (HCC) 05/11/2022   Elevated troponin level not due myocardial infarction 05/11/2022   Lactic acidosis 05/11/2022   Drug therapy 05/18/2021   Normocytic anemia    Arthritis    Bipolar 1 disorder (HCC)    Chicken pox    Chronic bronchitis (HCC)    Depression    Hyperlipidemia with target low density lipoprotein (LDL) cholesterol less than 55 mg/dL    Rheumatic fever    Essential hypertension    Chest pain 09/06/2020   Coronary artery disease involving native coronary artery of native heart with angina pectoris 09/06/2020   Angina pectoris 08/30/2020   Cardiac murmur 08/30/2020   Statin intolerance 07/31/2020   Abdominal aortic atherosclerosis    Ingrowing nail 03/12/2020   Bipolar affective disorder, currently depressed, moderate (HCC) 03/08/2020   Brain ischemia 03/08/2020  Cognitive complaints 03/08/2020   History of stroke 11/10/2019   Chronic kidney disease, stage 3a (HCC) 07/21/2019   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   Thoracic radiculopathy 06/16/2019   Intention tremor 06/16/2019   Right-sided chest wall pain 04/17/2019   Barrett's esophagus without dysplasia 03/25/2019   History of colon polyps 03/25/2019   PAD (peripheral artery disease) 03/23/2019   Benign prostatic hyperplasia with nocturia 12/29/2018   BPH with urinary obstruction 12/29/2018   GAD (generalized anxiety disorder) 11/02/2018   Rheumatoid arthritis (HCC) 10/29/2018   Lichen planus 04/07/2018   High risk medication use 03/23/2018   Seasonal allergies 03/23/2018   Long-term use of immunosuppressant medication 03/23/2018   Low testosterone 09/17/2016   Vitamin B12 deficiency 09/17/2016   Vitamin D  deficiency 09/17/2016   Disc degeneration, 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   Pain of right hip joint 01/19/2016   Herpes  gingivostomatitis 02/10/2015   Midline thoracic back pain 02/10/2015   Normochromic normocytic anemia 08/16/2014   Bipolar 1 disorder, mixed, moderate (HCC) 08/16/2014   Gastroesophageal reflux disease 08/16/2014   Bipolar depression (HCC) 08/16/2014   GERD without esophagitis 08/16/2014   Bipolar 2 disorder (HCC) 08/16/2014    PCP: Frann Mabel Mt, DO  REFERRING PROVIDER: Sherida Adine BROCKS, MD  REFERRING DIAG: [phone] (ICD-10-CM) - Displaced fracture of lateral end of right clavicle, subsequent encounter for fracture with routine healing  THERAPY DIAG:  Right shoulder pain, unspecified chronicity  Stiffness of right shoulder, not elsewhere classified  Abnormal posture  Other abnormalities of gait and mobility  Unsteadiness on feet  Muscle weakness (generalized)  Pain of both hip joints  Rationale for Evaluation and Treatment: Rehabilitation  ONSET DATE: December 5th   SUBJECTIVE:                                                                                                                                                                                     Per eval: Pt states over the past year or so he has developed balance/dizziness issues for which he has undergone significant work up. This includes episodes of syncope for which he states he is getting an ICD/pacemaker on February 19th. During one such episode on December 5th he states he fell and fractured his R clavicle, which he had previously fractured and required surgical repair. States this incident has been managed conservatively and seems to be getting better with time. He states he was just released from sling and told he could start to use his arm some, not allowed to lift but was cleared to use RW and cane per pt report. States initially he was requiring assist from spouse for ADLs but now can manage them  better on his own, still has some limitation. Spouse currently doing majority of housework, usually  split. He states he has been given HEP including wall slides.  Denies N/T or swelling at this point. Hx of intention tremor over the past year or so per pt report. Baseline dizziness that sometimes worsens with his syncopal episodes. Reports some baseline pain/mobility issues with RA but states overall his shoulder was doing well before fall.  Hand dominance: Right  SUBJECTIVE STATEMENT: Patient reports he has been feeling flared the last few days due to RA. Patient states he falls asleep on his Rt side but wakes on his Lt side. Patient states he did HEP this morning and had no pain/soreness; states he has some soreness first thing in the morning.   PERTINENT HISTORY: afib RVR, anemia, bipolar 1/2, chest pain, CKD3, CAD, HTN, intention tremor, myocarditis, pericarditis, RA, hx multiple TIAs, GAD  PAIN:  Are you having pain: 0/10 anterior R shoulder  Per eval:  Location/description: R shoulder Best-worst over past week: 0-4/10  - aggravating factors: sleeping (side sleeper), movement - Easing factors: ice pack, changing positions   PRECAUTIONS: R shoulder (per referral, P/AAROM only); fall risk; bradycardia/syncope  RED FLAGS: None   WEIGHT BEARING RESTRICTIONS: pt states he has been cleared to use AD with RUE   FALLS:  Has patient fallen in last 6 months? Yes. Number of falls 2 falls  LIVING ENVIRONMENT: Lives w/ spouse Pt states typically housework split, now wife doing more  OCCUPATION: Retired, freight forwarder business  PLOF: Independent - has been using cane for past year due to chronic balance issues  PATIENT GOALS: get mobility back in the arm, be able to use arm more  NEXT MD VISIT: February 6  OBJECTIVE:  Note: Objective measures were completed at Evaluation unless otherwise noted.  DIAGNOSTIC FINDINGS:  11/12/24 R shoulder XR: IMPRESSION: 1. Acute distal right clavicular fracture, distal to the lateral margin of the previous plate and screw  fixation.  11/12/24 CTH/neck without acute changes, please refer to EPIC for details  PATIENT SURVEYS:  Patient-specific activity scoring scheme (Point to one number):  0 represents unable to perform. 10 represents able to perform at prior level. 0 1 2 3 4 5 6 7 8 9  10 (Date and Score) Activity Initial  Activity Eval     Getting shirt on and off 5     Brushing teeth 6     Pulling up pants 4     Score: 15 total, 5 avg  COGNITION: Overall cognitive status: Within functional limits for tasks assessed     SENSATION: LT intact BIL Intention tremor evident RUE, consistent with pt hx  POSTURE: Kyphosis, fwd head  UPPER EXTREMITY ROM:  A/PROM Right eval Left eval  Shoulder flexion P: ~80 deg s  AA (table slides) 92 deg s A: 125 s  Shoulder abduction P: ~65 deg s A; 102 deg  Shoulder internal rotation    Shoulder external rotation    Elbow flexion    Elbow extension    Wrist flexion    Wrist extension     (Blank rows = not tested) (Key: WFL = within functional limits not formally assessed, * = concordant pain, s = stiffness/stretching sensation, NT = not tested)  Comments:    UPPER EXTREMITY MMT:  MMT Right eval Left eval  Shoulder flexion    Shoulder extension    Shoulder abduction    Shoulder extension    Shoulder internal rotation  Shoulder external rotation    Elbow flexion    Elbow extension    Grip strength    (Blank rows = not tested)  (Key: WFL = within functional limits not formally assessed, * = concordant pain, s = stiffness/stretching sensation, NT = not tested)  Comments: deferred given restrictions    PALPATION:  Concordant TTP R distal clavicle, no significant muscular TTP                                                                                                                              TREATMENT:  [facility] Adult PT Treatment:                                                DATE: 12/22/2024 Manual Therapy: Supine PROM  shoulder flexion, abduction + oscillations, ER Therapeutic Activity: Seated:  Shoulder flexion & scaption table slides AAROM 3x10 each (non-Wbing) Scapula retraction + chest lift 10 x 5 sec    [facility] Adult PT Treatment:                                                DATE: 12/20/24 Therapeutic Exercise: Table slides flexion pball assisted AAROM 3x8 cues for comfortable ROM and minimizing WB Table slides scaption pball assisted AAROM 3x8  Shoulder ER AAROM seated w/ cane, RUE cues for comfortable ROM  and reduced compensations at elbow 3x8 Scap retraction to neutral (from kyphotic resting posture) x15 HEP discussion/education, encouraging passive/AAROM and strategies to maintain comfortable mobility, indications for regression/progression   Self Care: Education/discussion re: sleep positioning for comfort/safety, safety w/ functional mobility, mitigating RUE AD use (he states it has become irritable)    [facility] Adult PT Treatment:                                                DATE: 12/16/24 Therapeutic Exercise: Table slides AAROM flexion x8 practice reps, cues to avoid WB, maintain comfortable ROM Table slides scaption AAROM x8  Elbow AROM x8 Scapular retraction to neutral (kyphotic resting posture) x8 HEP handout + education  Self Care: Education/discussion re: exam findings as they relate to symptom behavior, PT goals/POC, safety w/ mobility and activity, activity modification as indicated, discussed when using cane to utilize in LUE given safety concerns w/ relying solely on RUE   PATIENT EDUCATION: Education details: rationale for interventions, HEP  Person educated: Patient Education method: Explanation, Demonstration, Tactile cues, Verbal cues Education comprehension: verbalized understanding, returned demonstration, verbal cues required, tactile cues required, and needs further education  HOME EXERCISE PROGRAM: Access Code: [email or web address] URL:  [email or web address] Date: 12/22/2024 Prepared by: Lamarr Price  Exercises - Seated Shoulder Flexion Towel Slide at Table Top  - 2-3 x daily - 1 sets - 8-12 reps - Seated Elbow Flexion and Extension AROM  - 2-3 x daily - 1 sets - 8-12 reps - Seated Scapular Retraction  - 2-3 x daily - 1 sets - 8-12 reps - 5 sec hold - Seated Shoulder Scaption Slide at Table Top with Forearm in Neutral  - 2-3 x daily - 1 sets - 8-12 reps  ASSESSMENT:  CLINICAL IMPRESSION: Continued shoulder passive and active assist ROM within guidelines per evaluating PT communication with provider office.Cue for chest lift incorporated with scapula retraction to promote upright postural mechanics and awareness. Reviewed precautions with patient on P/AAROM shoulder mobility and compliance with non-weightbearing on Rt UE.    Per eval: Patient is a pleasant 74 y.o. gentleman who was seen today for physical therapy evaluation and treatment for R clavicular fracture. Per paper referral, P/AAROM only although pt states yesterday he was cleared from sling and was told he can use RUE for WB through RW/cane. On exam he demonstrates expected limitations in [facility] mobility, limited primarily by stiffness rather than pain. Tolerates HEP/exam well without increase in resting pain or adverse event. Self care education as above with emphasis on safe/comfortable mobility, safety w/ AD use, activity modification. Recommend trial of skilled PT to address aforementioned deficits with aim of improving functional tolerance and reducing pain with typical activities. Pt departs today's session in no acute distress, all voiced concerns/questions addressed appropriately from PT perspective.      OBJECTIVE IMPAIRMENTS: decreased activity tolerance, decreased endurance, decreased mobility, decreased ROM, decreased strength, impaired perceived functional ability, impaired UE functional use, postural dysfunction, and pain.   ACTIVITY LIMITATIONS:  carrying, lifting, sleeping, bathing, toileting, dressing, self feeding, reach over head, and hygiene/grooming  PARTICIPATION LIMITATIONS: meal prep, cleaning, laundry, and community activity  PERSONAL FACTORS: Age, Time since onset of injury/illness/exacerbation, and 3+ comorbidities: afib RVR, anemia, bipolar 1/2, chest pain, CKD3, CAD, HTN, intention tremor, myocarditis, pericarditis, RA, hx multiple TIAs, GAD are also affecting patient's functional outcome.   REHAB POTENTIAL: Fair given comorbidities  CLINICAL DECISION MAKING: Evolving/moderate complexity  EVALUATION COMPLEXITY: Moderate   GOALS:  SHORT TERM GOALS: Target date: 01/06/2025 Pt will demonstrate appropriate understanding and performance of initially prescribed HEP in order to facilitate improved independence with management of symptoms.  Baseline: HEP established  Goal status: INITIAL   2. Pt will report at least 25% improvement in overall pain levels over past week in order to facilitate improved tolerance to typical daily activities.   Baseline: 0-4/10  Goal status: INITIAL    LONG TERM GOALS: Target date: 01/27/2025 Pt will score greater than or equal to 8/10 avg on PSFS to indicate   Baseline: 5 avg   Goal status: INITIAL  2.  Pt will demonstrate at least 120 degrees of active shoulder elevation in order to demonstrate improved capacity for reaching activities.  Baseline: see ROM chart above - P/AAROM only on eval per referral Goal status: INITIAL  3.  Pt will report at least 50% decrease in overall pain levels in past week in order to facilitate improved tolerance to basic ADLs/mobility.   Baseline: 0-4/10  Goal status: INITIAL    4. Pt will report ability to perform usual dressing tasks (upper and lower body) without pain/limitation to improve tolerance to ADLs.   Baseline: reports moderate difficulty  with dressing tasks  Goal status: INITIAL   PLAN:  PT FREQUENCY: 1-2x/week  PT DURATION: 6  weeks  PLANNED INTERVENTIONS: 97164- PT Re-evaluation, 97750- Physical Performance Testing, 97110-Therapeutic exercises, 97530- Therapeutic activity, [id number]- Neuromuscular re-education, 97535- Self Care, [id number]- Manual therapy, Patient/Family education, Taping, Joint mobilization, Cryotherapy, and Moist heat  PLAN FOR NEXT SESSION: Review/update HEP PRN. Work on [employer] exercises as appropriate with emphasis on R GH P/AAROM within tolerance, posture. Symptom modification strategies as indicated/appropriate. Mindful of extensive medical hx including syncopal episodes of cardiac origin, plan for upcoming ICD/pacemaker placement in February per pt.    Lamarr Price, PTA 12/22/2024 3:36 PM  "

## 2024-12-27 ENCOUNTER — Ambulatory Visit: Admitting: Physical Therapy

## 2024-12-27 ENCOUNTER — Encounter: Payer: Self-pay | Admitting: Physical Therapy

## 2024-12-27 DIAGNOSIS — M25611 Stiffness of right shoulder, not elsewhere classified: Secondary | ICD-10-CM

## 2024-12-27 DIAGNOSIS — S42031D Displaced fracture of lateral end of right clavicle, subsequent encounter for fracture with routine healing: Secondary | ICD-10-CM | POA: Diagnosis not present

## 2024-12-27 DIAGNOSIS — M25511 Pain in right shoulder: Secondary | ICD-10-CM

## 2024-12-27 DIAGNOSIS — R293 Abnormal posture: Secondary | ICD-10-CM

## 2024-12-27 NOTE — Therapy (Signed)
 " OUTPATIENT PHYSICAL THERAPY TREATMENT   Patient Name: Noah Grant MRN: [id number] DOB:1951-08-12, 74 y.o., male Today's Date: 12/27/2024  END OF SESSION:  PT End of Session - 12/27/24 1352     Visit Number 4    Number of Visits 13    Date for Recertification  01/27/25    Authorization Type HTA    PT Start Time 1355    PT Stop Time 1440    PT Time Calculation (min) 45 min          Past Medical History:  Diagnosis Date   Abdominal aortic atherosclerosis    Anemia    likely of chronic disease   Angina pectoris 08/30/2020   Arthritis    rheumatoid   Atrial fibrillation (HCC)    Atrial fibrillation with rapid ventricular response (HCC) 05/12/2022   06/20/23 - pt states he's not had any A-fib   Barrett's esophagus without dysplasia 03/25/2019   Formatting of this note might be different from the original. EGD done in 02/2019. Due in 3 years.   Benign prostatic hyperplasia with nocturia 12/29/2018   Bipolar 1 disorder (HCC)    Bipolar 1 disorder, mixed, moderate (HCC) 08/16/2014   Bipolar 2 disorder (HCC) 08/16/2014   Bipolar affective disorder, currently depressed, moderate (HCC) 03/08/2020   Bipolar depression (HCC) 08/16/2014   BPH with urinary obstruction 12/29/2018   Brain ischemia 03/08/2020   Cardiac murmur 08/30/2020   Chest pain 09/06/2020   Chicken pox    Chronic bronchitis (HCC)    Chronic kidney disease, stage 3a (HCC) 07/21/2019   Cognitive complaints 03/08/2020   Coronary artery disease involving native coronary artery of native heart 09/06/2020   Coronary artery disease involving native coronary artery of native heart with angina pectoris 09/06/2020   Depression    Disc degeneration, 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   Drug therapy 05/18/2021   Elevated troponin level not due myocardial infarction 05/11/2022   Essential hypertension    GAD (generalized anxiety disorder) 11/02/2018   Gastroesophageal reflux disease 08/16/2014   Formatting of this note might be  different from the original. Last Assessment & Plan:  Well controlled. Continue current medications.   GERD without esophagitis 08/16/2014   Formatting of this note might be different from the original. Last Assessment & Plan:  Well controlled. Continue current medications.   Herpes gingivostomatitis 02/10/2015   Hiatal hernia    High risk medication use 03/23/2018   History of colon polyps 03/25/2019   Formatting of this note might be different from the original. tubular adenoma   History of stroke 11/10/2019   Hyperlipidemia    Hyperphosphatemia 10/30/2022   Hypertension    Ingrowing nail 03/12/2020   Intention tremor 06/16/2019   Labile hypertension 08/16/2014   Formatting of this note might be different from the original. Last Assessment & Plan:  Slightly above goal today secondary to pain. Will continue current regimen. Will check CMP today.   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 Concern over possible lichen planus. His dentist noticed lesions of his oral mucosa.  It was felt to be lichen planus.  He presents to discuss further.  Rarely does the area hurt.He smoked in the distant past EXAM shows several white patches on the buccal mucosa bilaterally.  No other concerning oral mucosal les   Lithium  use 10/30/2022   Long-term use of immunosuppressant medication 03/23/2018   Low testosterone  09/17/2016   Midline thoracic back pain 02/10/2015   Mixed hyperlipidemia 08/16/2014   Myocarditis (HCC) 05/14/2022   Normochromic normocytic anemia 08/16/2014   Normocytic anemia    likely of chronic disease   PAD (peripheral artery disease) 03/23/2019   Pain of right hip joint 01/19/2016   Pericarditis 05/12/2022   Raynaud's disease    Rheumatic fever    Rheumatoid arthritis (HCC) 10/29/2018   Formatting of this note might be different from the original. Rheum:  Dr. Ziolkowska Formerly  on MTX - stopped due to mouth sores. Currently started on Arava .  Also on 5 mg prednisone  x 3 months during run-in.   Right-sided chest wall pain 04/17/2019   Seasonal allergies 03/23/2018   Sepsis (HCC) 05/11/2022   Stasis dermatitis of both legs 07/29/2022   Statin intolerance 07/31/2020   Stroke (HCC)    3 - TIA's   Task-specific dystonia of hand    right hand   Thoracic radiculopathy 06/16/2019   Vitamin B12 deficiency    Injections   Vitamin D  deficiency 09/17/2016   Past Surgical History:  Procedure Laterality Date   CLAVICLE SURGERY     Right, Hardware placed   CORONARY STENT INTERVENTION N/A 09/06/2020   Procedure: CORONARY STENT INTERVENTION;  Surgeon: Wonda Sharper, MD;  Location: [facility] INVASIVE CV LAB;  Service: Cardiovascular;  Laterality: N/A;   FALSE ANEURYSM REPAIR Right 06/23/2023   Procedure: RIGHT ULNAR ARTERY PSEUDOANEURYSM REPAIR;  Surgeon: Lanis Fonda BRAVO, MD;  Location: [facility] OR;  Service: Vascular;  Laterality: Right;   ingrown toenail Bilateral    great toes   LEFT HEART CATH AND CORONARY ANGIOGRAPHY N/A 09/06/2020   Procedure: LEFT HEART CATH AND CORONARY ANGIOGRAPHY;  Surgeon: Wonda Sharper, MD;  Location: [facility] INVASIVE CV LAB;  Service: Cardiovascular;  Laterality: N/A;   LEFT HEART CATH AND CORONARY ANGIOGRAPHY N/A 06/06/2023   Procedure: LEFT HEART CATH AND CORONARY ANGIOGRAPHY;  Surgeon: Anner Alm ORN, MD;  Location: [facility] INVASIVE CV LAB;  Service: Cardiovascular;  Laterality: N/A;   WISDOM TOOTH EXTRACTION     Patient Active Problem List   Diagnosis Date Noted   Gait instability 10/06/2024   Dehydration 06/03/2024   Chronic fatigue 12/21/2023   Trigger finger, left middle finger 09/30/2023   Pseudoaneurysm 06/23/2023   Hyperphosphatemia 10/30/2022   Lithium  use 10/30/2022   Anemia 09/02/2022   Atrial fibrillation (HCC) 09/02/2022   Stasis dermatitis of both legs 07/29/2022   Myocarditis (HCC) 05/14/2022   Pericarditis 05/12/2022   Atrial  fibrillation with rapid ventricular response (HCC) 05/12/2022   Sepsis (HCC) 05/11/2022   Elevated troponin level not due myocardial infarction 05/11/2022   Lactic acidosis 05/11/2022   Drug therapy 05/18/2021   Normocytic anemia    Arthritis    Bipolar 1 disorder (HCC)    Chicken pox    Chronic bronchitis (HCC)    Depression    Hyperlipidemia with target low density lipoprotein (LDL) cholesterol less than 55 mg/dL    Rheumatic fever    Essential hypertension    Chest pain 09/06/2020   Coronary artery disease involving native coronary artery of native heart with angina pectoris 09/06/2020   Angina pectoris 08/30/2020   Cardiac murmur 08/30/2020   Statin intolerance 07/31/2020   Abdominal aortic atherosclerosis    Ingrowing nail 03/12/2020   Bipolar affective disorder, currently depressed, moderate (HCC) 03/08/2020   Brain ischemia 03/08/2020   Cognitive complaints 03/08/2020   History of stroke 11/10/2019   Chronic kidney disease, stage 3a (HCC) 07/21/2019  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   Thoracic radiculopathy 06/16/2019   Intention tremor 06/16/2019   Right-sided chest wall pain 04/17/2019   Barrett's esophagus without dysplasia 03/25/2019   History of colon polyps 03/25/2019   PAD (peripheral artery disease) 03/23/2019   Benign prostatic hyperplasia with nocturia 12/29/2018   BPH with urinary obstruction 12/29/2018   GAD (generalized anxiety disorder) 11/02/2018   Rheumatoid arthritis (HCC) 10/29/2018   Lichen planus 04/07/2018   High risk medication use 03/23/2018   Seasonal allergies 03/23/2018   Long-term use of immunosuppressant medication 03/23/2018   Low testosterone 09/17/2016   Vitamin B12 deficiency 09/17/2016   Vitamin D  deficiency 09/17/2016   Disc degeneration, 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   Pain of right hip joint 01/19/2016   Herpes gingivostomatitis 02/10/2015   Midline thoracic back pain 02/10/2015   Normochromic normocytic anemia 08/16/2014    Bipolar 1 disorder, mixed, moderate (HCC) 08/16/2014   Gastroesophageal reflux disease 08/16/2014   Bipolar depression (HCC) 08/16/2014   GERD without esophagitis 08/16/2014   Bipolar 2 disorder (HCC) 08/16/2014    PCP: Frann Mabel Mt, DO  REFERRING PROVIDER: Sherida Adine BROCKS, MD  REFERRING DIAG: [phone] (ICD-10-CM) - Displaced fracture of lateral end of right clavicle, subsequent encounter for fracture with routine healing  THERAPY DIAG:  Right shoulder pain, unspecified chronicity  Stiffness of right shoulder, not elsewhere classified  Abnormal posture  Rationale for Evaluation and Treatment: Rehabilitation  ONSET DATE: December 5th   SUBJECTIVE:                                                                                                                                                                                     Per eval: Pt states over the past year or so he has developed balance/dizziness issues for which he has undergone significant work up. This includes episodes of syncope for which he states he is getting an ICD/pacemaker on February 19th. During one such episode on December 5th he states he fell and fractured his R clavicle, which he had previously fractured and required surgical repair. States this incident has been managed conservatively and seems to be getting better with time. He states he was just released from sling and told he could start to use his arm some, not allowed to lift but was cleared to use RW and cane per pt report. States initially he was requiring assist from spouse for ADLs but now can manage them better on his own, still has some limitation. Spouse currently doing majority of housework, usually split. He states he has been given HEP including wall slides.  Denies N/T or swelling at this point. Hx of intention tremor over the past  year or so per pt report. Baseline dizziness that sometimes worsens with his syncopal episodes. Reports some  baseline pain/mobility issues with RA but states overall his shoulder was doing well before fall.  Hand dominance: Right  SUBJECTIVE STATEMENT: 12/27/2024: pt states both shoulders are bothering him about the same, having anterior shoulder pain BIL. He does note he has continued using his R arm. No pain outside of baseline RA discomfort today. He does mention passive ROM was relieving last session.    PERTINENT HISTORY: afib RVR, anemia, bipolar 1/2, chest pain, CKD3, CAD, HTN, intention tremor, myocarditis, pericarditis, RA, hx multiple TIAs, GAD  PAIN:  Are you having pain: I always hurt (pt attributes to RA hx), denies any additional pain R shoulder  Per eval:  Location/description: R shoulder Best-worst over past week: 0-4/10  - aggravating factors: sleeping (side sleeper), movement - Easing factors: ice pack, changing positions   PRECAUTIONS: R shoulder (per referral, P/AAROM only); fall risk; bradycardia/syncope  RED FLAGS: None   WEIGHT BEARING RESTRICTIONS: pt states he has been cleared to use AD with RUE   FALLS:  Has patient fallen in last 6 months? Yes. Number of falls 2 falls  LIVING ENVIRONMENT: Lives w/ spouse Pt states typically housework split, now wife doing more  OCCUPATION: Retired, freight forwarder business  PLOF: Independent - has been using cane for past year due to chronic balance issues  PATIENT GOALS: get mobility back in the arm, be able to use arm more  NEXT MD VISIT: February 6  OBJECTIVE:  Note: Objective measures were completed at Evaluation unless otherwise noted.  DIAGNOSTIC FINDINGS:  11/12/24 R shoulder XR: IMPRESSION: 1. Acute distal right clavicular fracture, distal to the lateral margin of the previous plate and screw fixation.  11/12/24 CTH/neck without acute changes, please refer to EPIC for details  PATIENT SURVEYS:  Patient-specific activity scoring scheme (Point to one number):  0 represents unable to  perform. 10 represents able to perform at prior level. 0 1 2 3 4 5 6 7 8 9  10 (Date and Score) Activity Initial  Activity Eval     Getting shirt on and off 5     Brushing teeth 6     Pulling up pants 4     Score: 15 total, 5 avg  COGNITION: Overall cognitive status: Within functional limits for tasks assessed     SENSATION: LT intact BIL Intention tremor evident RUE, consistent with pt hx  POSTURE: Kyphosis, fwd head  UPPER EXTREMITY ROM:  A/PROM Right eval Left eval Right 12/27/24  Shoulder flexion P: ~80 deg s  AA (table slides) 92 deg s A: 125 s AA: 108 deg   Shoulder abduction P: ~65 deg s A; 102 deg AA: 73 deg (limited more so by tremors)   Shoulder internal rotation     Shoulder external rotation     Elbow flexion     Elbow extension     Wrist flexion     Wrist extension      (Blank rows = not tested) (Key: WFL = within functional limits not formally assessed, * = concordant pain, s = stiffness/stretching sensation, NT = not tested)  Comments:    UPPER EXTREMITY MMT:  MMT Right eval Left eval  Shoulder flexion    Shoulder extension    Shoulder abduction    Shoulder extension    Shoulder internal rotation    Shoulder external rotation    Elbow flexion    Elbow extension  Grip strength    (Blank rows = not tested)  (Key: WFL = within functional limits not formally assessed, * = concordant pain, s = stiffness/stretching sensation, NT = not tested)  Comments: deferred given restrictions    PALPATION:  Concordant TTP R distal clavicle, no significant muscular TTP                                                                                                                              TREATMENT:   [facility] Adult PT Treatment:                                                DATE: 12/27/24 Therapeutic Exercise: Shoulder flexion AAROM swiss ball at table, cues to avoid WB; 2x15 RUE ; cues for posture and comfortable ROM  Shoulder abduction AAROM w/  cane 2x8 RUE cues for posture and comfortable ROM HEP update + education  Manual Therapy: Supine; R shoulder passive physiological movement  flex/abd/ER within pt tolerance, gentle oscillations to reduce muscle guarding  Self Care: Continued education/discussion re: avoiding AROM and lifting per physician communication, activity modification, relevant anatomy/physiology and rationale for interventions   [facility] Adult PT Treatment:                                                DATE: 12/22/2024 Manual Therapy: Supine PROM shoulder flexion, abduction + oscillations, ER Therapeutic Activity: Seated:  Shoulder flexion & scaption table slides AAROM 3x10 each (non-Wbing) Scapula retraction + chest lift 10 x 5 sec    [facility] Adult PT Treatment:                                                DATE: 12/20/24 Therapeutic Exercise: Table slides flexion pball assisted AAROM 3x8 cues for comfortable ROM and minimizing WB Table slides scaption pball assisted AAROM 3x8  Shoulder ER AAROM seated w/ cane, RUE cues for comfortable ROM  and reduced compensations at elbow 3x8 Scap retraction to neutral (from kyphotic resting posture) x15 HEP discussion/education, encouraging passive/AAROM and strategies to maintain comfortable mobility, indications for regression/progression   Self Care: Education/discussion re: sleep positioning for comfort/safety, safety w/ functional mobility, mitigating RUE AD use (he states it has become irritable)   PATIENT EDUCATION: Education details: rationale for interventions, HEP  Person educated: Patient Education method: Explanation, Demonstration, Tactile cues, Verbal cues Education comprehension: verbalized understanding, returned demonstration, verbal cues required, tactile cues required, and needs further education     HOME EXERCISE PROGRAM: Access Code: [email or web address] URL: [email or web address] Date: 12/27/2024 Prepared by:  Alm Jenny  Exercises - Seated  Shoulder Flexion Towel Slide at Table Top  - 2-3 x daily - 1 sets - 8-12 reps - Seated Elbow Flexion and Extension AROM  - 2-3 x daily - 1 sets - 8-12 reps - Seated Scapular Retraction  - 2-3 x daily - 1 sets - 8-12 reps - 5 sec hold - Standing Shoulder Abduction AAROM with Dowel  - 2-3 x daily - 1 sets - 8 reps  ASSESSMENT:  CLINICAL IMPRESSION: 12/27/2024: Pt arrives w/ report of baseline pain from RA but denies any increased pain R shoulder. Today we continue working on P/AAROM of R shoulder within pt tolerance. Limited primarily by stiffness/stretching sensation and tremors, denies pain. No adverse events, reports relief with passive physiological movement  as above, no increase in pain at end of session. Continuing to reinforce activity modification and maintaining P/AAROM only per communications w/ physician office. Updated HEP as above given report of discomfort performing scaption table slides at home, does well with abduction AAROM in clinic. Recommend continuing along current POC in order to address relevant deficits and improve functional tolerance. Pt departs today's session in no acute distress, all voiced questions/concerns addressed appropriately from PT perspective.    Per eval: Patient is a pleasant 74 y.o. gentleman who was seen today for physical therapy evaluation and treatment for R clavicular fracture. Per paper referral, P/AAROM only although pt states yesterday he was cleared from sling and was told he can use RUE for WB through RW/cane. On exam he demonstrates expected limitations in [facility] mobility, limited primarily by stiffness rather than pain. Tolerates HEP/exam well without increase in resting pain or adverse event. Self care education as above with emphasis on safe/comfortable mobility, safety w/ AD use, activity modification. Recommend trial of skilled PT to address aforementioned deficits with aim of improving functional tolerance and reducing pain with typical activities. Pt  departs today's session in no acute distress, all voiced concerns/questions addressed appropriately from PT perspective.      OBJECTIVE IMPAIRMENTS: decreased activity tolerance, decreased endurance, decreased mobility, decreased ROM, decreased strength, impaired perceived functional ability, impaired UE functional use, postural dysfunction, and pain.   ACTIVITY LIMITATIONS: carrying, lifting, sleeping, bathing, toileting, dressing, self feeding, reach over head, and hygiene/grooming  PARTICIPATION LIMITATIONS: meal prep, cleaning, laundry, and community activity  PERSONAL FACTORS: Age, Time since onset of injury/illness/exacerbation, and 3+ comorbidities: afib RVR, anemia, bipolar 1/2, chest pain, CKD3, CAD, HTN, intention tremor, myocarditis, pericarditis, RA, hx multiple TIAs, GAD are also affecting patient's functional outcome.   REHAB POTENTIAL: Fair given comorbidities  CLINICAL DECISION MAKING: Evolving/moderate complexity  EVALUATION COMPLEXITY: Moderate   GOALS:  SHORT TERM GOALS: Target date: 01/06/2025 Pt will demonstrate appropriate understanding and performance of initially prescribed HEP in order to facilitate improved independence with management of symptoms.  Baseline: HEP established  Goal status: INITIAL   2. Pt will report at least 25% improvement in overall pain levels over past week in order to facilitate improved tolerance to typical daily activities.   Baseline: 0-4/10  Goal status: INITIAL    LONG TERM GOALS: Target date: 01/27/2025 Pt will score greater than or equal to 8/10 avg on PSFS to indicate   Baseline: 5 avg   Goal status: INITIAL  2.  Pt will demonstrate at least 120 degrees of active shoulder elevation in order to demonstrate improved capacity for reaching activities.  Baseline: see ROM chart above - P/AAROM only on eval per referral Goal status: INITIAL  3.  Pt will report at least 50% decrease in overall pain levels in past week in order to  facilitate improved tolerance to basic ADLs/mobility.   Baseline: 0-4/10  Goal status: INITIAL    4. Pt will report ability to perform usual dressing tasks (upper and lower body) without pain/limitation to improve tolerance to ADLs.   Baseline: reports moderate difficulty with dressing tasks  Goal status: INITIAL   PLAN:  PT FREQUENCY: 1-2x/week  PT DURATION: 6 weeks  PLANNED INTERVENTIONS: 97164- PT Re-evaluation, 97750- Physical Performance Testing, 97110-Therapeutic exercises, 97530- Therapeutic activity, [id number]- Neuromuscular re-education, 97535- Self Care, [id number]- Manual therapy, Patient/Family education, Taping, Joint mobilization, Cryotherapy, and Moist heat  PLAN FOR NEXT SESSION: Review/update HEP PRN. Work on [employer] exercises as appropriate with emphasis on R GH P/AAROM within tolerance, posture. Symptom modification strategies as indicated/appropriate. Mindful of extensive medical hx including syncopal episodes of cardiac origin, plan for upcoming ICD/pacemaker placement in February per pt.    Alm DELENA Jenny PT, DPT 12/27/2024 2:46 PM  "

## 2024-12-29 NOTE — Assessment & Plan Note (Signed)
 SABRA

## 2024-12-29 NOTE — Progress Notes (Unsigned)
 "  Office Visit Note  Patient: Noah Grant             Date of Birth: 02-28-1951           MRN: [id number]             PCP: Frann Mabel Mt, DO Referring: Frann Mabel Mt, DO Visit Date: 01/10/2025   Subjective:  No chief complaint on file.   History of Present Illness: Noah Grant is a 74 y.o. 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 daily.   Previous HPI 10/06/2024 Noah Grant is a 74 y.o. 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 daily. [name] is accompanied by his wife.   He experiences chronic back pain, particularly in the cervical region, and has been under the care of an orthopedist who suggested surgery. He is exploring alternative pain management options, including injections and medication. Due to kidney issues, he cannot take NSAIDs and is currently using diclofenac  topically, which provides some relief. He has tried various topical treatments, including lidocaine  patches and Icy Hot, but finds Voltaren  to be the most effective.   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 An MRI of the heart is scheduled for December 5th to assess for scar tissue. He has a history of high blood pressure, which is now controlled with three different medications, though he notes a recent drop in diastolic pressure to the low fifties. He experiences balance issues, often falling against walls, but does not describe it as dizziness. No neuropathy symptoms during the day, but they occur at night. He previously tried gabapentin  but found it caused excessive drowsiness.   He reports having tremors and has been diagnosed with Raynaud's phenomenon, experiencing blue discoloration in his fingers. His hands feel 'useless' and he frequently drops things. He experiences some swelling in the ankles.         Previous  HPI 06/29/2024 Noah Grant is a 74 y.o. 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 daily.   He experiences significant stiffness and loss of function in his hands, stating he is 'losing the use of my hands' and cannot pick up pills or hold objects effectively. Persistent pain and stiffness are also present in his shoulders and hips, described as 'hurt like the devil.' His feet are painful, and he is unsure of the cause. He has tried various arthritis medications in the past, without significant relief. He is currently taking leflunomide  20 mg and uses Voltaren  gel on his back and hips and a lidocaine  patch on his lower back. He discontinued prednisone  due to side effects especially worsening his mood with irritability.   He has been experiencing jaw fatigue and drooling for about a year, with symptoms gradually worsening. His jaw gets tired and painful when chewing, requiring frequent pauses. The drooling occurs at the corners of his mouth. No visual changes such as double vision or blurriness, and no headaches.    He reports a decline in kidney function over the past year, with fluctuating levels. He mentions a history of high blood pressure as a potential cause, but recent urine tests were normal.   He is on blood-thinning medication and has a history of bruising easily, experiencing intermittent bruising, particularly on his right hand, which he attributes to possible trauma.  He is scheduled for a PET scan on September 3rd. He has undergone previous cardiac evaluations, including stents and catheterization.   He experiences stiffness in his legs and difficulty with mobility, noting that his legs are 'so stiff.' He attempts to do stretching exercises but finds them challenging. NSome swelling in his hands but not significantly in his legs. No recent changes in a rash on his legs.         Previous HPI 03/30/2024 Noah Grant is a 74 y.o. 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 daily and prednisone  5 mg daily as needed.     He has ongoing issues with rheumatoid arthritis, primarily affecting his shoulders, hips, and hands. He experiences significant difficulty with fine motor tasks, such as picking up and holding objects, and frequently drops items. Loading his pill box weekly is particularly challenging. He continues to take leflunomide  and uses steroids as needed, though prednisone  makes him irritable.   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 The pain is described as aching and is associated with stiffness and soreness, particularly after sitting for extended periods. He finds it difficult to rise from furniture and benefits from a chair-height toilet. He has limited shoulder movement and experiences soreness in the shoulder joint itself, though the pain does not radiate down the arm.   He has developed Raynaud's phenomenon, jokingly referring to himself as a 'smurf.' He experiences significant fatigue and lightheadedness, particularly when walking uphill or on inclines, though he manages downhill walking better. He underwent vascular studies to rule out peripheral artery disease, which were negative. He has seen a cardiologist and is scheduled for a follow-up in June.   No recent illnesses such as upper respiratory infections or gastrointestinal issues. He has not experienced any stomach or gut problems with leflunomide . Muscle relaxers have been used in the past but provide limited relief and cause drowsiness, so he only takes them at night. Joint injections have been tried previously with minimal benefit.   Previous HPI 12/31/2023 Noah Grant is a 74 y.o. 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 daily and prednisone  5 mg daily as needed.  He presents with worsening pain and discomfort in multiple areas. They report  severe foot pain, particularly at night, and increasing hip pain that radiates into the leg muscles. The patient also notes worsening 'duck feet,' with their feet turning outward more than before.   The patient's hands have been turning blue several times a day, primarily under the fingernails, and the tips of their fingers are numb and tingly. They report a loss of hand function, with difficulty picking up and holding items. Their primary care provider suggested this might be Raynaud's disease.   The patient also reports persistent shoulder pain. Despite these issues, the patient states they feel really good overall, except for the aforementioned symptoms.   The patient's medications include amlodipine , olmesartan , and hydralazine  for their cardiovascular condition. They also take leflunomide  and prednisone  for rheumatoid arthritis, and occasionally use over-the-counter Tylenol  for pain.   The patient has a history of atrial fibrillation but has not had any clots or recurrence of the condition since their hospitalization for myocarditis. They were previously on Eliquis  and Plavix , but are currently on low-dose aspirin .   The patient also has a hiatal hernia and Barrett's disease, and has been  struggling with severe acid reflux despite taking omeprazole  twice daily. They are due to start on famotidine  in addition to the omeprazole .   The patient has not been on any muscle relaxants or neuropathy medications recently, but has previously taken gabapentin , which they stopped due to sedation. They also report a reaction to isosorbide , which caused lethargy and joint pain.   The patient's blood pressure has stabilized with their current medication regimen. They have not been sick recently, but had a bladder infection treated with antibiotics about five to six months ago. They are due to see a gastroenterologist next month for their hiatal hernia and Barrett's disease.        Previous  HPI 09/30/2023 SOU NOHR is a 74 y.o. 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 daily and prednisone  5 mg daily as needed.  Continues to experience daily joint pain and stiffness worst complaints in his hands and hips.  He feels there is an increase in hand swelling since the last visit.  Addition of 5 mg prednisone  he does not feel made a very big difference and is only taking occasionally.  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  In his left hand experiences pain near the base of the third finger and frequently has pain and getting stuck first thing in the morning. Besides arthritis he complains of generalized fatigue and he has to pause after short periods of exertion although he does not feel any specific change in breathing.  He still having frequent chest pain taking oral nitrates up to twice daily.   Previous HPI 06/30/2023 ANTUANE EASTRIDGE is a 74 y.o. 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 daily since our last visit still has a lot of joint pain limiting his activities uses a cane for support which decreases leg pain.  Still has a lot of shoulder pain especially with trying to reach overhead or behind his back and bilateral hand pain especially in the MCPs.  No specific flareup or exacerbation of swelling.  He did have heart catheterization through right ulnar artery that was complicated by pseudoaneurysm had subsequent surgical repair for this.  Lateral shoulder and hip pain bothers him especially if he lies to his side at night.     Previous HPI 03/31/2023 KORTNEY SCHOENFELDER is a 75 y.o. 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 daily we switch medications after last visit.  So far has not seen any significant difference in joint symptoms for better or worse.  He is tolerating the medication without noticeable side effect.  He had follow-up with his PCP office  and discussed bilateral foot pain that was apparently unclear if related to peripheral neuropathy or from his rheumatoid arthritis.  Pain is mostly in the anterior half of the foot more severe along the top and across MTPs particularly bad early in the day when weightbearing or with direct pressure.  His mobility is also limited with bilateral hip pain.  Hurts worse on the side of the hips gets much worse after prolonged walking also gets pain if lying on either side in bed from direct pressure.   Previous HPI 01/29/23 SATISH HAMMERS is a 74 y.o. male here for follow up for seropositive RA on Actemra  162 mg subcu q. 14 days.  Overall he is feels very poorly like his arthritis is not benefiting at all from  the treatment and mobility is worse than a few months ago.  He stopped some cardiac medications with improvement in his dizziness and leg swelling and fatigue.  Recent 2D echo look normal with no evidence of effusion or reduced ejection fraction from previous myocarditis episode.  He has ongoing bilateral shoulder pain with stiffness that remains throughout the entire day.  He is using a cane for offloading due to pain affecting hips and feet on both sides worse while walking.  He is seeing some diffuse swelling throughout the hands this appears worse at the end of the day does not localize to any particular joints.  Does cause difficulty fully closing his grip.   Previous HPI 10/29/22 GARFIELD COINER is a 74 y.o. 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  He completed the colchicine  and is now on just the Actemra  treatment.  He is feeling very fatigued limiting his overall activity level.  He is also been experiencing some additional pain and stiffness in multiple areas.  He had increased swelling in both legs accumulating towards the feet and ankles.  He has had adjustment to beta blocker treatment currently prescribed metoprolol  XR 25 mg daily.  Bradycardia and heart rate improved now often in the 50s did not notice any difference in the peripheral swelling after the medicine change. He is also still on norvasc  and hydralazine  for blood pressure control.   Previous HPI 05/30/22 KRIS NO is a 73 y.o. male here for follow up for RA he was on leflunomide  recently hospitalized with sepsis and pericarditis without clear underlying infection identified.  Symptoms started with chills developed increasing swelling in his hands and feet with joint pain that was worse in the shoulders and hips.  He developed severe chest pain felt a constriction or tightness around the heart.  The chest pain was sharp did report provocation with deep breaths or lying in left or right positions.  Evaluation was consistent with pericarditis with effusion.  He was discharged on colchicine  and received short term steroids and the chest pain is currently doing well.  The flareup of joint pain has also improved with the oral anti-inflammatory medication.   Previous HPI 04/19/22 BECKHAM CAPISTRAN is a 74 y.o. male here for rheumatoid arthritis previously seeing Dr. Ziolkowska and on treatment with leflunomide .  Symptoms started a long time ago gradual onset but has been seeing trucks for treatment of this in the past decade or so.  He has joint pain and stiffness involving multiple areas.  Shoulders, wrists, hands, also bilateral hip pains.  Most commonly sees visible swelling or inflammation in his hands.  He has morning stiffness very severe for 30 minutes to an hour but keeps persistent joint stiffness throughout the day.  Also has pain increased when trying to lie still at night.  He has tried a number of treatments for this over multiple years.  He did not tolerate hydroxychloroquine due to confusion, MTX not tolerated, leflunomide  leukopenia, and sulfasalazine mood disturbances.  He never experienced a significant improvement with Humira injection and developed acute  conjunctivitis shortly after starting Remicade  infusion.   DMARD Hx LEF current MTX GI intolerance HCQ Psychiatric side effects SSZ Psychiatric side effects Humira nonresponder Remicade  nonresponder and conjunctivitis   12/2021 Cholesterol 107 TGs 193 HDL 28.9   09/2021 HBV neg HCV neg   No Rheumatology ROS completed.   PMFS History:  Patient Active Problem List  Diagnosis Date Noted   Gait instability 10/06/2024   Dehydration 06/03/2024   Chronic fatigue 12/21/2023   Trigger finger, left middle finger 09/30/2023   Pseudoaneurysm 06/23/2023   Hyperphosphatemia 10/30/2022   Lithium  use 10/30/2022   Anemia 09/02/2022   Atrial fibrillation (HCC) 09/02/2022   Stasis dermatitis of both legs 07/29/2022   Myocarditis (HCC) 05/14/2022   Pericarditis 05/12/2022   Atrial fibrillation with rapid ventricular response (HCC) 05/12/2022   Sepsis (HCC) 05/11/2022   Elevated troponin level not due myocardial infarction 05/11/2022   Lactic acidosis 05/11/2022   Drug therapy 05/18/2021   Normocytic anemia    Arthritis    Bipolar 1 disorder (HCC)    Chicken pox    Chronic bronchitis (HCC)    Depression    Hyperlipidemia with target low density lipoprotein (LDL) cholesterol less than 55 mg/dL    Rheumatic fever    Essential hypertension    Chest pain 09/06/2020   Coronary artery disease involving native coronary artery of native heart with angina pectoris 09/06/2020   Angina pectoris 08/30/2020   Cardiac murmur 08/30/2020   Statin intolerance 07/31/2020   Abdominal aortic atherosclerosis    Ingrowing nail 03/12/2020   Bipolar affective disorder, currently depressed, moderate (HCC) 03/08/2020   Brain ischemia 03/08/2020   Cognitive complaints 03/08/2020   History of stroke 11/10/2019   Chronic kidney disease, stage 3a (HCC) 07/21/2019   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   Thoracic radiculopathy 06/16/2019   Intention tremor 06/16/2019   Right-sided chest wall pain  04/17/2019   Barrett's esophagus without dysplasia 03/25/2019   History of colon polyps 03/25/2019   PAD (peripheral artery disease) 03/23/2019   Benign prostatic hyperplasia with nocturia 12/29/2018   BPH with urinary obstruction 12/29/2018   GAD (generalized anxiety disorder) 11/02/2018   Rheumatoid arthritis (HCC) 10/29/2018   Lichen planus 04/07/2018   High risk medication use 03/23/2018   Seasonal allergies 03/23/2018   Long-term use of immunosuppressant medication 03/23/2018   Low testosterone 09/17/2016   Vitamin B12 deficiency 09/17/2016   Vitamin D  deficiency 09/17/2016   Disc degeneration, 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   Pain of right hip joint 01/19/2016   Herpes gingivostomatitis 02/10/2015   Midline thoracic back pain 02/10/2015   Normochromic normocytic anemia 08/16/2014   Bipolar 1 disorder, mixed, moderate (HCC) 08/16/2014   Gastroesophageal reflux disease 08/16/2014   Bipolar depression (HCC) 08/16/2014   GERD without esophagitis 08/16/2014   Bipolar 2 disorder (HCC) 08/16/2014    Past Medical History:  Diagnosis Date   Abdominal aortic atherosclerosis    Anemia    likely of chronic disease   Angina pectoris 08/30/2020   Arthritis    rheumatoid   Atrial fibrillation (HCC)    Atrial fibrillation with rapid ventricular response (HCC) 05/12/2022   06/20/23 - pt states he's not had any A-fib   Barrett's esophagus without dysplasia 03/25/2019   Formatting of this note might be different from the original. EGD done in 02/2019. Due in 3 years.   Benign prostatic hyperplasia with nocturia 12/29/2018   Bipolar 1 disorder (HCC)    Bipolar 1 disorder, mixed, moderate (HCC) 08/16/2014   Bipolar 2 disorder (HCC) 08/16/2014   Bipolar affective disorder, currently depressed, moderate (HCC) 03/08/2020   Bipolar depression (HCC) 08/16/2014   BPH with urinary obstruction 12/29/2018   Brain ischemia 03/08/2020   Cardiac murmur 08/30/2020   Chest pain 09/06/2020   Chicken pox     Chronic bronchitis (HCC)    Chronic kidney disease, stage 3a (HCC)  07/21/2019   Cognitive complaints 03/08/2020   Coronary artery disease involving native coronary artery of native heart 09/06/2020   Coronary artery disease involving native coronary artery of native heart with angina pectoris 09/06/2020   Depression    Disc degeneration, 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 Last Assessment & Plan:  Well controlled. Continue current medications.   GERD without esophagitis 08/16/2014   Formatting of this note might be different from the original. Last Assessment & Plan:  Well controlled. Continue current medications.   Herpes gingivostomatitis 02/10/2015   Hiatal hernia    High risk medication use 03/23/2018   History of colon polyps 03/25/2019   Formatting of this note might be different from the original. tubular adenoma   History of stroke 11/10/2019   Hyperlipidemia    Hyperphosphatemia 10/30/2022   Hypertension    Ingrowing nail 03/12/2020   Intention tremor 06/16/2019   Labile hypertension 08/16/2014   Formatting of this note might be different from the original. Last Assessment & Plan:  Slightly above goal today secondary to pain. Will continue current regimen. Will check CMP today.   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 Concern over possible lichen planus. His dentist noticed lesions of his oral mucosa.  It was felt to be lichen planus.  He presents to discuss further.  Rarely does the area hurt.He smoked in the distant past EXAM shows several white patches on the buccal mucosa bilaterally.  No other concerning oral mucosal  les   Lithium  use 10/30/2022   Long-term use of immunosuppressant medication 03/23/2018   Low testosterone 09/17/2016   Midline thoracic back pain 02/10/2015   Mixed hyperlipidemia 08/16/2014   Myocarditis (HCC) 05/14/2022   Normochromic normocytic anemia 08/16/2014   Normocytic anemia    likely of chronic disease   PAD (peripheral artery disease) 03/23/2019   Pain of right hip joint 01/19/2016   Pericarditis 05/12/2022   Raynaud's disease    Rheumatic fever    Rheumatoid arthritis (HCC) 10/29/2018   Formatting of this note might be different from the original. Rheum:  Dr. Ziolkowska Formerly on MTX - stopped due to mouth sores. Currently started on Arava .  Also on 5 mg prednisone  x 3 months during run-in.   Right-sided chest wall pain 04/17/2019   Seasonal allergies 03/23/2018   Sepsis (HCC) 05/11/2022   Stasis dermatitis of both legs 07/29/2022   Statin intolerance 07/31/2020   Stroke (HCC)    3 - TIA's   Task-specific dystonia of hand    right hand   Thoracic radiculopathy 06/16/2019   Vitamin B12 deficiency    Injections   Vitamin D  deficiency 09/17/2016    Family History  Problem Relation Age of Onset   Alzheimer's disease Mother 66       Deceased in early 6s   Stroke Father 63       Deceased   Hypertension Father    Alcoholism Father    Alcoholism Brother    Heart disease Maternal Grandfather 37       Deceased   Heart disease Paternal Grandfather 44       Deceased   Hypertension Son  Past Surgical History:  Procedure Laterality Date   CLAVICLE SURGERY     Right, Hardware placed   CORONARY STENT INTERVENTION N/A 09/06/2020   Procedure: CORONARY STENT INTERVENTION;  Surgeon: Wonda Sharper, MD;  Location: [facility] INVASIVE CV LAB;  Service: Cardiovascular;  Laterality: N/A;   FALSE ANEURYSM REPAIR Right 06/23/2023   Procedure: RIGHT ULNAR ARTERY PSEUDOANEURYSM REPAIR;  Surgeon: Lanis Fonda BRAVO, MD;  Location: [facility] OR;  Service: Vascular;  Laterality: Right;    ingrown toenail Bilateral    great toes   LEFT HEART CATH AND CORONARY ANGIOGRAPHY N/A 09/06/2020   Procedure: LEFT HEART CATH AND CORONARY ANGIOGRAPHY;  Surgeon: Wonda Sharper, MD;  Location[facility] INVASIVE CV LAB;  Service: Cardiovascular;  Laterality: N/A;   LEFT HEART CATH AND CORONARY ANGIOGRAPHY N/A 06/06/2023   Procedure: LEFT HEART CATH AND CORONARY ANGIOGRAPHY;  Surgeon: Anner Alm ORN, MD;  Location: [facility] INVASIVE CV LAB;  Service: Cardiovascular;  Laterality: N/A;   WISDOM TOOTH EXTRACTION     Social History   Social History Narrative   Not on file   Immunization History  Administered Date(s) Administered   DTaP 10/13/2023   Fluad  Quad(high Dose 65+) 09/03/2021, 09/04/2022, 08/29/2023   INFLUENZA, HIGH DOSE SEASONAL PF 09/10/2019, 09/13/2019, 09/03/2020   Influenza, Seasonal, Injecte, Preservative Fre 09/13/2019   Influenza,inj,Quad PF,6+ Mos 08/22/2014, 10/04/2015   Influenza-Unspecified 08/22/2014, 10/04/2015, 09/01/2024   Moderna SARS-COV2 Booster Vaccination 10/31/2020, 07/04/2021   Moderna Sars-Covid-2 Vaccination 01/17/2020, 02/13/2020   PNEUMOCOCCAL CONJUGATE-20 01/02/2022   Pfizer(Comirnaty )Fall Seasonal Vaccine 12 years and older 09/18/2023, 09/22/2024   Pneumococcal Conjugate-13 03/17/2018   Pneumococcal Polysaccharide-23 09/16/2018   Pneumococcal-Unspecified 09/16/2018   Zoster Recombinant(Shingrix) 07/02/2017, 09/02/2017   Zoster, Live 08/17/2011     Objective: Vital Signs: There were no vitals taken for this visit.   Physical Exam   Musculoskeletal Exam: ***   Investigation: No additional findings.  Imaging: No results found.  Recent Labs: Lab Results  Component Value Date   WBC 5.5 11/19/2024   HGB 9.7 (L) 11/19/2024   PLT 220.0 11/19/2024   NA 138 11/12/2024   K 5.2 (H) 11/12/2024   CL 108 11/12/2024   CO2 23 11/12/2024   GLUCOSE 113 (H) 11/12/2024   BUN 32 (H) 11/12/2024   CREATININE 1.92 (H) 11/12/2024   BILITOT 0.5 11/01/2024    ALKPHOS 45 11/01/2024   AST 17 11/01/2024   ALT 14 11/01/2024   PROT 6.6 11/01/2024   ALBUMIN 4.7 11/01/2024   CALCIUM  9.3 11/12/2024   GFRAA 75 11/28/2020   QFTBGOLDPLUS NEGATIVE 06/29/2024    Speciality Comments: No specialty comments available.  Procedures:  No procedures performed Allergies: Methotrexate, Nsaids, Plaquenil [hydroxychloroquine], Azulfidine [sulfasalazine], Crestor  [rosuvastatin ], Iodinated contrast media, Isosorbide , Mevacor  [lovastatin ], Pravachol  [pravastatin ], and Ranexa  [ranolazine  er]   Assessment / Plan:     Visit Diagnoses:  Assessment & Plan Rheumatoid arthritis involving multiple sites, unspecified whether rheumatoid factor present (HCC)     High risk medication use      ***  Follow-Up Instructions: No follow-ups on file.   Andraya Frigon M Rayner Erman, CMA  Note - This record has been created using [employer] nutrition[employer].  Chart creation errors have been sought, but may not always  have been located. Such creation errors do not reflect on  the standard of medical care. "

## 2024-12-29 NOTE — Assessment & Plan Note (Signed)
 Noah Grant

## 2024-12-30 ENCOUNTER — Ambulatory Visit: Admitting: Physical Therapy

## 2025-01-10 ENCOUNTER — Ambulatory Visit: Admitting: Internal Medicine

## 2025-01-10 DIAGNOSIS — M069 Rheumatoid arthritis, unspecified: Secondary | ICD-10-CM

## 2025-01-10 DIAGNOSIS — Z79899 Other long term (current) drug therapy: Secondary | ICD-10-CM

## 2025-01-14 ENCOUNTER — Other Ambulatory Visit: Payer: Self-pay | Admitting: Internal Medicine

## 2025-01-14 DIAGNOSIS — M069 Rheumatoid arthritis, unspecified: Secondary | ICD-10-CM

## 2025-01-14 NOTE — Telephone Encounter (Signed)
 Last Fill: 10/06/2024  Labs: 11/20/2024 RBC 2.93 HGB 8.9 HCT 28.8 MCV 98.3 MCHC 30.9 Absolute Lymphocyte count 0.45  Next Visit: 03/01/2025  Last Visit: 10/06/2024  DX: Rheumatoid arthritis involving multiple sites, unspecified whether rheumatoid factor present   Current Dose per office note 10/06/2024: leflunomide  20 mg daily   Okay to refill Arava  ?

## 2025-03-01 ENCOUNTER — Ambulatory Visit: Admitting: Internal Medicine

## 2025-03-09 ENCOUNTER — Ambulatory Visit: Admitting: Psychiatry

## 2025-03-23 ENCOUNTER — Ambulatory Visit: Admitting: Family Medicine
